# Patient Record
Sex: Female | Born: 1935 | Race: White | Hispanic: No | Marital: Married | Smoking: Former smoker
Health system: Southern US, Community
[De-identification: ages and names within clinical notes are randomized; demographics above are authoritative.]

## PROBLEM LIST (undated history)

## (undated) DIAGNOSIS — K219 Gastro-esophageal reflux disease without esophagitis: Secondary | ICD-10-CM

## (undated) DIAGNOSIS — Z9289 Personal history of other medical treatment: Secondary | ICD-10-CM

## (undated) DIAGNOSIS — I471 Supraventricular tachycardia, unspecified: Secondary | ICD-10-CM

## (undated) DIAGNOSIS — H5440 Blindness, one eye, unspecified eye: Secondary | ICD-10-CM

## (undated) DIAGNOSIS — N189 Chronic kidney disease, unspecified: Secondary | ICD-10-CM

## (undated) DIAGNOSIS — I10 Essential (primary) hypertension: Secondary | ICD-10-CM

## (undated) DIAGNOSIS — E039 Hypothyroidism, unspecified: Secondary | ICD-10-CM

## (undated) DIAGNOSIS — E785 Hyperlipidemia, unspecified: Secondary | ICD-10-CM

## (undated) DIAGNOSIS — I2699 Other pulmonary embolism without acute cor pulmonale: Secondary | ICD-10-CM

## (undated) DIAGNOSIS — Z8679 Personal history of other diseases of the circulatory system: Secondary | ICD-10-CM

## (undated) DIAGNOSIS — Z953 Presence of xenogenic heart valve: Secondary | ICD-10-CM

## (undated) DIAGNOSIS — Z9889 Other specified postprocedural states: Secondary | ICD-10-CM

## (undated) DIAGNOSIS — M1990 Unspecified osteoarthritis, unspecified site: Secondary | ICD-10-CM

## (undated) DIAGNOSIS — J449 Chronic obstructive pulmonary disease, unspecified: Secondary | ICD-10-CM

## (undated) HISTORY — PX: EYE SURGERY: SHX253

## (undated) HISTORY — DX: Other specified postprocedural states: Z98.890

## (undated) HISTORY — DX: Personal history of other diseases of the circulatory system: Z86.79

## (undated) HISTORY — PX: ANOMALOUS PULMONARY VENOUS RETURN REPAIR: SHX255

## (undated) HISTORY — DX: Supraventricular tachycardia: I47.1

## (undated) HISTORY — DX: Essential (primary) hypertension: I10

## (undated) HISTORY — DX: Presence of xenogenic heart valve: Z95.3

## (undated) HISTORY — DX: Personal history of other medical treatment: Z92.89

## (undated) HISTORY — DX: Hyperlipidemia, unspecified: E78.5

## (undated) HISTORY — DX: Other pulmonary embolism without acute cor pulmonale: I26.99

## (undated) HISTORY — DX: Supraventricular tachycardia, unspecified: I47.10

## (undated) HISTORY — DX: Blindness, one eye, unspecified eye: H54.40

## (undated) HISTORY — DX: Hypothyroidism, unspecified: E03.9

## (undated) HISTORY — DX: Chronic obstructive pulmonary disease, unspecified: J44.9

## (undated) HISTORY — DX: Chronic kidney disease, unspecified: N18.9

## (undated) HISTORY — PX: TONSILLECTOMY: SUR1361

---

## 1968-04-26 HISTORY — PX: BACK SURGERY: SHX140

## 1997-08-16 ENCOUNTER — Ambulatory Visit (HOSPITAL_COMMUNITY): Admission: RE | Admit: 1997-08-16 | Discharge: 1997-08-16 | Payer: Self-pay | Admitting: Internal Medicine

## 1998-10-24 ENCOUNTER — Ambulatory Visit (HOSPITAL_COMMUNITY): Admission: RE | Admit: 1998-10-24 | Discharge: 1998-10-24 | Payer: Self-pay | Admitting: Gastroenterology

## 2000-01-28 ENCOUNTER — Other Ambulatory Visit: Admission: RE | Admit: 2000-01-28 | Discharge: 2000-01-28 | Payer: Self-pay | Admitting: Internal Medicine

## 2000-02-11 ENCOUNTER — Ambulatory Visit (HOSPITAL_COMMUNITY): Admission: RE | Admit: 2000-02-11 | Discharge: 2000-02-11 | Payer: Self-pay | Admitting: Internal Medicine

## 2000-02-11 ENCOUNTER — Encounter: Payer: Self-pay | Admitting: Internal Medicine

## 2000-11-23 ENCOUNTER — Encounter: Payer: Self-pay | Admitting: Internal Medicine

## 2000-11-23 ENCOUNTER — Encounter: Admission: RE | Admit: 2000-11-23 | Discharge: 2000-11-23 | Payer: Self-pay | Admitting: Internal Medicine

## 2001-03-15 ENCOUNTER — Ambulatory Visit (HOSPITAL_COMMUNITY): Admission: RE | Admit: 2001-03-15 | Discharge: 2001-03-15 | Payer: Self-pay | Admitting: Internal Medicine

## 2001-03-15 ENCOUNTER — Encounter: Payer: Self-pay | Admitting: Internal Medicine

## 2001-03-20 ENCOUNTER — Encounter: Admission: RE | Admit: 2001-03-20 | Discharge: 2001-03-20 | Payer: Self-pay | Admitting: Internal Medicine

## 2001-03-20 ENCOUNTER — Encounter: Payer: Self-pay | Admitting: Internal Medicine

## 2002-09-03 ENCOUNTER — Encounter: Payer: Self-pay | Admitting: Internal Medicine

## 2002-09-03 ENCOUNTER — Ambulatory Visit (HOSPITAL_COMMUNITY): Admission: RE | Admit: 2002-09-03 | Discharge: 2002-09-03 | Payer: Self-pay | Admitting: Internal Medicine

## 2004-05-18 ENCOUNTER — Ambulatory Visit (HOSPITAL_COMMUNITY): Admission: RE | Admit: 2004-05-18 | Discharge: 2004-05-18 | Payer: Self-pay | Admitting: Internal Medicine

## 2005-07-05 ENCOUNTER — Ambulatory Visit (HOSPITAL_COMMUNITY): Admission: RE | Admit: 2005-07-05 | Discharge: 2005-07-05 | Payer: Self-pay | Admitting: Internal Medicine

## 2006-02-15 ENCOUNTER — Encounter: Payer: Self-pay | Admitting: Cardiology

## 2006-03-10 ENCOUNTER — Encounter: Admission: RE | Admit: 2006-03-10 | Discharge: 2006-03-10 | Payer: Self-pay | Admitting: Internal Medicine

## 2006-11-07 ENCOUNTER — Ambulatory Visit (HOSPITAL_COMMUNITY): Admission: RE | Admit: 2006-11-07 | Discharge: 2006-11-07 | Payer: Self-pay | Admitting: Internal Medicine

## 2007-07-25 ENCOUNTER — Encounter: Payer: Self-pay | Admitting: Critical Care Medicine

## 2007-08-08 ENCOUNTER — Encounter: Admission: RE | Admit: 2007-08-08 | Discharge: 2007-08-08 | Payer: Self-pay | Admitting: Internal Medicine

## 2007-09-05 ENCOUNTER — Encounter: Payer: Self-pay | Admitting: Critical Care Medicine

## 2007-09-20 ENCOUNTER — Ambulatory Visit: Admission: RE | Admit: 2007-09-20 | Discharge: 2007-09-20 | Payer: Self-pay | Admitting: Internal Medicine

## 2007-09-20 ENCOUNTER — Encounter: Payer: Self-pay | Admitting: Cardiology

## 2007-10-05 ENCOUNTER — Encounter: Admission: RE | Admit: 2007-10-05 | Discharge: 2007-10-05 | Payer: Self-pay | Admitting: Internal Medicine

## 2007-10-19 ENCOUNTER — Encounter: Payer: Self-pay | Admitting: Critical Care Medicine

## 2007-12-13 ENCOUNTER — Ambulatory Visit (HOSPITAL_COMMUNITY): Admission: RE | Admit: 2007-12-13 | Discharge: 2007-12-13 | Payer: Self-pay | Admitting: Internal Medicine

## 2008-03-06 DIAGNOSIS — I517 Cardiomegaly: Secondary | ICD-10-CM | POA: Insufficient documentation

## 2008-03-07 ENCOUNTER — Ambulatory Visit: Payer: Self-pay | Admitting: Critical Care Medicine

## 2008-03-07 DIAGNOSIS — J449 Chronic obstructive pulmonary disease, unspecified: Secondary | ICD-10-CM

## 2008-03-07 DIAGNOSIS — I719 Aortic aneurysm of unspecified site, without rupture: Secondary | ICD-10-CM | POA: Insufficient documentation

## 2008-03-07 DIAGNOSIS — J438 Other emphysema: Secondary | ICD-10-CM

## 2008-03-07 DIAGNOSIS — J4489 Other specified chronic obstructive pulmonary disease: Secondary | ICD-10-CM | POA: Insufficient documentation

## 2008-04-25 ENCOUNTER — Encounter: Payer: Self-pay | Admitting: Critical Care Medicine

## 2008-04-26 DIAGNOSIS — Z8679 Personal history of other diseases of the circulatory system: Secondary | ICD-10-CM

## 2008-04-26 DIAGNOSIS — I2699 Other pulmonary embolism without acute cor pulmonale: Secondary | ICD-10-CM

## 2008-04-26 HISTORY — DX: Personal history of other diseases of the circulatory system: Z86.79

## 2008-04-26 HISTORY — PX: THORACIC AORTIC ANEURYSM REPAIR: SHX799

## 2008-04-26 HISTORY — DX: Other pulmonary embolism without acute cor pulmonale: I26.99

## 2008-05-08 ENCOUNTER — Encounter: Payer: Self-pay | Admitting: Critical Care Medicine

## 2008-06-13 ENCOUNTER — Encounter (HOSPITAL_COMMUNITY): Admission: RE | Admit: 2008-06-13 | Discharge: 2008-06-21 | Payer: Self-pay | Admitting: Cardiology

## 2008-07-22 ENCOUNTER — Encounter: Payer: Self-pay | Admitting: Cardiology

## 2008-08-01 ENCOUNTER — Encounter: Payer: Self-pay | Admitting: Cardiology

## 2008-08-12 ENCOUNTER — Encounter: Payer: Self-pay | Admitting: Cardiology

## 2008-09-04 ENCOUNTER — Ambulatory Visit: Payer: Self-pay | Admitting: Critical Care Medicine

## 2008-09-05 ENCOUNTER — Encounter: Payer: Self-pay | Admitting: Critical Care Medicine

## 2008-12-16 ENCOUNTER — Telehealth (INDEPENDENT_AMBULATORY_CARE_PROVIDER_SITE_OTHER): Payer: Self-pay | Admitting: *Deleted

## 2008-12-16 ENCOUNTER — Ambulatory Visit (HOSPITAL_COMMUNITY): Admission: RE | Admit: 2008-12-16 | Discharge: 2008-12-16 | Payer: Self-pay | Admitting: Internal Medicine

## 2008-12-18 ENCOUNTER — Telehealth (INDEPENDENT_AMBULATORY_CARE_PROVIDER_SITE_OTHER): Payer: Self-pay | Admitting: *Deleted

## 2008-12-26 ENCOUNTER — Ambulatory Visit: Admission: RE | Admit: 2008-12-26 | Discharge: 2008-12-26 | Payer: Self-pay | Admitting: Critical Care Medicine

## 2008-12-26 ENCOUNTER — Encounter: Payer: Self-pay | Admitting: Critical Care Medicine

## 2008-12-27 ENCOUNTER — Encounter: Payer: Self-pay | Admitting: Critical Care Medicine

## 2009-01-01 ENCOUNTER — Ambulatory Visit: Payer: Self-pay | Admitting: Critical Care Medicine

## 2009-01-02 ENCOUNTER — Encounter: Payer: Self-pay | Admitting: Cardiology

## 2009-01-28 HISTORY — PX: THORACOABDOMINAL AORTIC ANEURYSM REPAIR: SHX2504

## 2009-03-11 ENCOUNTER — Ambulatory Visit: Payer: Self-pay | Admitting: Critical Care Medicine

## 2009-05-26 ENCOUNTER — Encounter: Payer: Self-pay | Admitting: Cardiology

## 2009-06-12 ENCOUNTER — Ambulatory Visit: Payer: Self-pay | Admitting: Critical Care Medicine

## 2009-11-04 ENCOUNTER — Ambulatory Visit: Payer: Self-pay | Admitting: Cardiology

## 2009-11-04 DIAGNOSIS — I359 Nonrheumatic aortic valve disorder, unspecified: Secondary | ICD-10-CM | POA: Insufficient documentation

## 2009-11-04 DIAGNOSIS — E785 Hyperlipidemia, unspecified: Secondary | ICD-10-CM

## 2009-11-04 DIAGNOSIS — R0989 Other specified symptoms and signs involving the circulatory and respiratory systems: Secondary | ICD-10-CM

## 2009-11-04 DIAGNOSIS — I10 Essential (primary) hypertension: Secondary | ICD-10-CM

## 2009-11-05 ENCOUNTER — Telehealth: Payer: Self-pay | Admitting: Cardiology

## 2009-11-06 LAB — CONVERTED CEMR LAB
AST: 23 units/L (ref 0–37)
Albumin: 4 g/dL (ref 3.5–5.2)
Alkaline Phosphatase: 51 units/L (ref 39–117)
Bilirubin, Direct: 0.1 mg/dL (ref 0.0–0.3)
HDL: 61.8 mg/dL (ref >39.00)
LDL Cholesterol: 109 mg/dL — ABNORMAL HIGH (ref 0–99)
Total Bilirubin: 0.8 mg/dL (ref 0.3–1.2)
Total CHOL/HDL Ratio: 3
Triglycerides: 85 mg/dL (ref 0.0–149.0)

## 2009-12-01 ENCOUNTER — Ambulatory Visit: Payer: Self-pay | Admitting: Internal Medicine

## 2009-12-01 ENCOUNTER — Encounter: Payer: Self-pay | Admitting: Cardiovascular Disease

## 2009-12-01 ENCOUNTER — Ambulatory Visit: Payer: Self-pay

## 2009-12-01 ENCOUNTER — Encounter: Payer: Self-pay | Admitting: Cardiology

## 2009-12-01 ENCOUNTER — Ambulatory Visit (HOSPITAL_COMMUNITY): Admission: RE | Admit: 2009-12-01 | Discharge: 2009-12-01 | Payer: Self-pay | Admitting: Cardiology

## 2009-12-17 ENCOUNTER — Ambulatory Visit: Payer: Self-pay | Admitting: Critical Care Medicine

## 2009-12-18 ENCOUNTER — Ambulatory Visit (HOSPITAL_COMMUNITY): Admission: RE | Admit: 2009-12-18 | Discharge: 2009-12-18 | Payer: Self-pay | Admitting: Internal Medicine

## 2010-01-09 ENCOUNTER — Ambulatory Visit: Payer: Self-pay | Admitting: Cardiology

## 2010-01-13 LAB — CONVERTED CEMR LAB
ALT: 16 units/L (ref 0–35)
AST: 25 units/L (ref 0–37)
Alkaline Phosphatase: 56 units/L (ref 39–117)
Cholesterol: 189 mg/dL (ref 0–200)
LDL Cholesterol: 114 mg/dL — ABNORMAL HIGH (ref 0–99)
Total Bilirubin: 0.3 mg/dL (ref 0.3–1.2)
Total CHOL/HDL Ratio: 3
VLDL: 12.8 mg/dL (ref 0.0–40.0)

## 2010-05-06 ENCOUNTER — Encounter: Payer: Self-pay | Admitting: Cardiology

## 2010-05-06 ENCOUNTER — Ambulatory Visit
Admission: RE | Admit: 2010-05-06 | Discharge: 2010-05-06 | Payer: Self-pay | Source: Home / Self Care | Attending: Cardiology | Admitting: Cardiology

## 2010-05-28 NOTE — Assessment & Plan Note (Signed)
Summary: np6/hx of heart valve replacement//jml   Primary Provider:  Juline Patch  CC:  new patient.  Hx of heart valve replacement and ascending aorta.  History of Present Illness: 77 yo with history of bioprosthetic aortic valve replacement as well as extensive aortic aneurym repair involving the ascending and descending thoracic aorta as well as the abdominal aorta presents to establish cardiology followup.  Patient had her initial operation (AVR and ascending thoracic aortic aneurysm repair) in 1/10.  In 10/10, she had repair of the descending thoracic and abdominal aorta.  She has done well post-operatively and follows closely with Dr. Ty Hilts at [facility], where she had the operations.  She has moderate COPD and is followed by Dr. Delford Field.  She is not on home oxygen.  In general, she is doing well.  She push mows her lawn with mild shortness of breath.  She can walk on flat ground without shortness of breath. No exertional chest pain.  She uses a Symbicort inhaler for her COPD.   ECG: NSR at 52, otherwise normal  Labs (4/11): K 4.7, creatinine 1.5 Labs (7/11): LDL 109, HDL 61  Current Medications (verified): 1)  Synthroid 125 Mcg Tabs (Levothyroxine Sodium) .Marland Kitchen.. 1 By Mouth Daily 2)  Metoprolol Tartrate 50 Mg Tabs (Metoprolol Tartrate) .... Take One Tablet Two Times A Day 3)  Vitamin D 69629 Unit Caps (Ergocalciferol) .Marland Kitchen.. 1 By Mouth Monthly 4)  Omeprazole 20 Mg Tbec (Omeprazole) .... One By Mouth Once Daily 5)  Proair Hfa 108 (90 Base) Mcg/act Aers (Albuterol Sulfate) .... As Needed 6)  Calcium 500 Mg Tabs (Calcium Carbonate) .... One By Mouth Once Daily 7)  Simvastatin 20 Mg Tabs (Simvastatin) .Marland Kitchen.. 1 By Mouth At Bedtime 8)  Amlodipine Besylate 5 Mg Tabs (Amlodipine Besylate) .Marland Kitchen.. 1 By Mouth Daily 9)  Symbicort 160-4.5 Mcg/act  Aero (Budesonide-Formoterol Fumarate) .... Two Puffs Twice Daily 10)  Aspirin 81 Mg Tabs (Aspirin) .... Take One Tablet Three Times A Week 11)  Losartan  Potassium 100 Mg Tabs (Losartan Potassium) .... Once Daily  Allergies: 1)  ! Pcn 2)  ! Codeine  Past History:  Past Medical History: 1. EMPHYSEMA : Followed by Dr. Delford Field. 2.  Thoracic aortic aneurysm repair 1/10 also with bioprosthetic aortic valve replacement [facility]).  In 10/10, she had a descending thoracic aorta aneurysm and abdominal aortic aneurysm repair [facility]).  Vascular surgeon is Dr. Ty Hilts.  3.  Pulmonary embolism 2010.  She is no longer taking coumadin.  4.  SVT: Short runs seen on prior holter. 5.  Hypothyroidism 6.  R eye blind 7.  Hyperlipidemia 8.  Bioprosthetic aortic valve: most recent echo (4/10) with EF > 55% and apical/mid-distal septal hypokinesis, normal bioprosthetic aortic valve 9.  CKD 10.  HTN  Family History: Brain cancer--MGM No premature CAD  Social History: Quit smoking in 2006. 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   Review of Systems       All systems reviewed and negative except as per HPI.   Vital Signs:  Patient profile:   77 year old female Height:      61 inches Weight:      125 pounds BMI:     23.70 Pulse rate:   54 / minute Pulse rhythm:   regular BP sitting:   114 / 61  (left arm) Cuff size:   regular  Vitals Entered By: Judithe Modest CMA (November 04, 2009 11:21 AM)  Physical Exam  General:  Well developed, well  nourished, in no acute distress. Head:  normocephalic and atraumatic Eyes:  Right eye blindness Mouth:  Teeth, gums and palate normal. Oral mucosa normal. Neck:  Neck supple,  JVP 7-8 cm. No masses, thyromegaly or abnormal cervical nodes. Lungs:  Clear bilaterally to auscultation and percussion. Heart:  Non-displaced PMI, chest non-tender; regular rate and rhythm, S1, S2 without rubs or gallops. 2/6 early systolic murmur.  Carotid upstroke normal, left carotid bruit.  Pedals normal pulses. No edema, no varicosities. Abdomen:  Bowel sounds positive; abdomen soft and non-tender without masses,  organomegaly, or hernias noted. No hepatosplenomegaly. Msk:  Back normal, normal gait. Muscle strength and tone normal. Extremities:  No clubbing or cyanosis. Neurologic:  Alert and oriented x 3. Psych:  Normal affect.   Impression & Recommendations:  Problem # 1:  AORTIC DISORDER (ICD-424.1) Patient is status post bioprosthetic AVR and extensive replacement of the thoracic and abdominal aorta.  She has done well post-operatively.  She will followup at [facility] for imaging of her aorta.  I will get an echocardiogram to assess the aortic valve bioprosthesis.  I also recommended that she take aspirin 81 mg daily rather than 3 times a week.   Problem # 2:  CAROTID BRUIT (ICD-785.9) Left carotid bruit.  Will get a carotid doppler study.   Problem # 3:  EMPHYSEMA (ICD-492.8) Stable mild exertional dyspnea.    Problem # 4:  HYPERLIPIDEMIA-MIXED (ICD-272.4) Given carotid bruit and likely vascular disease, would like to see LDL at least < 100.  Will increase Zocor to 40 mg daily.   Problem # 5:  HYPERTENSION, UNSPECIFIED (ICD-401.9) Need good blood pressure control with aortic aneurysmal disease.  Her BP today is excellent, continue current meds.   Other Orders: Carotid Duplex (Carotid Duplex) Echocardiogram (Echo) TLB-Lipid Panel (80061-LIPID) TLB-Hepatic/Liver Function Pnl (80076-HEPATIC)  Patient Instructions: 1)  Your physician has recommended you make the following change in your medication:  2)  Take Aspirin 81mg  daily--this should be buffered or coated. 3)  Your physician has requested that you have a carotid duplex. This test is an ultrasound of the carotid arteries in your neck. It looks at blood flow through these arteries that supply the brain with blood. Allow one hour for this exam. There are no restrictions or special instructions. 4)  Your physician has requested that you have an echocardiogram.  Echocardiography is a painless test that uses sound waves to create images of  your heart. It provides your doctor with information about the size and shape of your heart and how well your heart's chambers and valves are working.  This procedure takes approximately one hour. There are no restrictions for this procedure. 5)  Your physician wants you to follow-up in: 6 months with Dr Shirlee Latch.  You will receive a reminder letter in the mail two months in advance. If you don't receive a letter, please call our office to schedule the follow-up appointment. 6)  Your physician recommends that you return for a FASTING lipid profile/liver profile today.

## 2010-05-28 NOTE — Assessment & Plan Note (Signed)
Summary: Pulmonary OV   Copy to:  Dr. Ricki Miller Primary Provider/Referring Provider:  Juline Patch  CC:  6 month follow up.  Pt states she does have SOB when lifting heavy objects or walking a long distance but this is no worse from last OV.  Denies wheezing, chest tightness, and cough..  History of Present Illness: Pulmonary OV:      This is a 77 year old woman with COPD and primary emphysema.    Pt dx with COPD and primary emphysema 6/09 when being eval for Thoracic aortic aneurysm.  Symptoms of dyspnea came on gradually over summer.  Pt is dypneic if walks fast more than 1/2 block.  No chest pain.  Occ wheeze.  No cough.  Not dyspneic at rest. No GERD symptoms.  Has proair and uses as needed without much improvement.  No edema in feet.   Pt denies any significant sore throat, nasal congestion or excess secretions, fever, chills, sweats, unintended weight loss, pleurtic or exertional chest pain, orthopnea PND, or leg swelling Has been on spiriva in the past but only used one week.  March 11, 2009 10:13 AM Had Aneurysm repair at [facility].The pt did well with the surgery. The pt  was in hospital 3weeks The pain is ok.  Did well with aneuyrsm repair.  Dyspnea is stable now.  Now not much cough Pt on symbicort and spiriva and ?s if can stop spiriva Pt denies any significant sore throat, nasal congestion or excess secretions, fever, chills, sweats, unintended weight loss, pleurtic or exertional chest pain, orthopnea PND, or leg swelling Pt denies any increase in rescue therapy over baseline, denies waking up needing it or having any early am or nocturnal exacerbations of coughing/wheezing/or dyspnea.  June 12, 2009 1:46 PM No real cough.  Dyspnea the same.  Worse if climb stairs or heavy lifting.  No real wheeze.   No new complaintsAugust 24, 2011 2:17 PM Not much change since 2/11.   No real cough.  Justgot over bronchitis.  PCP rx avelox.  Felt congestion in the chest.  First episode of  same in past 1.5 yrs.  Now pt is well.  No new issues. Pt is less dyspneic.  No chest pain is noted.    Preventive Screening-Counseling & Management  Alcohol-Tobacco     Smoking Status: quit > 6 months     Year Quit: 2006     Pack years: 50 years x1 pack q2-3days  Current Medications (verified): 1)  Synthroid 112 Mcg Tabs (Levothyroxine Sodium) .... Take 1 Tablet By Mouth Once A Day 2)  Metoprolol Tartrate 50 Mg Tabs (Metoprolol Tartrate) .... Take One Tablet Two Times A Day 3)  Vitamin D 16109 Unit Caps (Ergocalciferol) .Marland Kitchen.. 1 By Mouth Monthly 4)  Omeprazole 20 Mg Tbec (Omeprazole) .... One By Mouth Once Daily 5)  Proair Hfa 108 (90 Base) Mcg/act Aers (Albuterol Sulfate) .... As Needed 6)  Calcium 500 Mg Tabs (Calcium Carbonate) .... One By Mouth Once Daily 7)  Lipitor 20 Mg Tabs (Atorvastatin Calcium) .... One Daily 8)  Amlodipine Besylate 5 Mg Tabs (Amlodipine Besylate) .Marland Kitchen.. 1 By Mouth Daily 9)  Symbicort 160-4.5 Mcg/act  Aero (Budesonide-Formoterol Fumarate) .... Two Puffs Twice Daily 10)  Aspirin 81 Mg Tabs (Aspirin) .... Three Times Per Week 11)  Losartan Potassium 100 Mg Tabs (Losartan Potassium) .... Once Daily 12)  Boniva 150 Mg Tabs (Ibandronate Sodium) .... Once Monthly  Allergies (verified): 1)  ! Pcn 2)  ! Codeine  Past  History:  Past medical, surgical, family and social histories (including risk factors) reviewed, and no changes noted (except as noted below).  Past Medical History: Reviewed history from 11/04/2009 and no changes required. 1. EMPHYSEMA : Followed by Dr. Delford Field. 2.  Thoracic aortic aneurysm repair 1/10 also with bioprosthetic aortic valve replacement [facility]).  In 10/10, she had a descending thoracic aorta aneurysm and abdominal aortic aneurysm repair [facility]).  Vascular surgeon is Dr. Ty Hilts.  3.  Pulmonary embolism 2010.  She is no longer taking coumadin.  4.  SVT: Short runs seen on prior holter. 5.  Hypothyroidism 6.  R eye blind 7.   Hyperlipidemia 8.  Bioprosthetic aortic valve: most recent echo (4/10) with EF > 55% and apical/mid-distal septal hypokinesis, normal bioprosthetic aortic valve 9.  CKD 10.  HTN  Past Surgical History: Reviewed history from 03/11/2009 and no changes required. Back surgery  1970 TAA repair 1/10 AVR 1/10 Thoracoabdominal aneurysm  Repair 01/28/2009   Past Pulmonary History:  Pulmonary History: COPD primary emphysema  Pulmonary Function Test  Date: 09/20/2007 Gender: Female  Pre-Spirometry  FVC     Value: 1.50 L/min   Pred: 2.28 L/min     % Pred: 66 % FEV1     Value: 0.95 L     Pred: 1.83 L     % Pred: 52 % FEV1/FVC   Value: 63 %     Pred: 83 %     FEF 25-75   Value: 0.46 L/min   Pred: 1.94 L/min     % Pred: 23 %  Post-Spirometry  FVC     Value: 1.67 L/min   Pred: 2.28 L/min     % Pred: 73 % FEV1     Value: 1.10 L     Pred: 1.83 L     % Pred: 60 % FEV1/FVC   Value: 66 %     Pred: 83 %     FEF 25-75   Value: 0.58 L/min   Pred: 1.94 L/min     % Pred: 30 %  Lung Volumes  DLCO     Value: 16.1 %   % Pred: 101 % DLCO/VA   Value: 4.50 %   % Pred: 127 %  Evaluation:  severe obstruction with significant bronchodilator response  Pulmonary Function Test  Date: 12/26/2008 Height (in.): 61 Gender: Female  Pre-Spirometry  FVC     Value: 1.31 L/min   Pred: 2.49 L/min     % Pred: 53 % FEV1     Value: 0.94 L     Pred: 1.86 L     % Pred: 50 % FEV1/FVC   Value: 72 %     Pred: 75 %     % Pred: 96 % FEF 25-75   Value: 0.57 L/min   Pred: 1.54 L/min     % Pred: 37 %  Post-Spirometry  FVC     Value: 1.29 L/min   Pred: 2.49 L/min     % Pred: 52 % FEV1     Value: 0.96 L     Pred: 1.86 L     % Pred: 52 % FEV1/FVC   Value: 75 %     Pred: 75 %     % Pred: 100 % FEF 25-75   Value: 0.66 L/min   Pred: 1.54 L/min     % Pred: 43 %  Lung Volumes  TLC     Value: 3.58 L   % Pred: 78 % RV  Value: 61 L   % Pred: 136 % DLCO     Value: 8.63 %   % Pred: 43 % DLCO/VA   Value: 3.44 %   % Pred: 78  %  Comments:  Severe airflow obstruction,  moderate airtrapping,  severe reduction in DLCO consistent with known dx of emphysema  Family History: Reviewed history from 11/04/2009 and no changes required. Brain cancer--MGM No premature CAD  Social History: Reviewed history from 11/04/2009 and no changes required. Quit smoking in 2006. 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   Review of Systems       The patient complains of shortness of breath with activity.  The patient denies shortness of breath at rest, productive cough, non-productive cough, coughing up blood, chest pain, irregular heartbeats, acid heartburn, indigestion, loss of appetite, weight change, abdominal pain, difficulty swallowing, sore throat, tooth/dental problems, headaches, nasal congestion/difficulty breathing through nose, sneezing, itching, ear ache, anxiety, depression, hand/feet swelling, joint stiffness or pain, rash, change in color of mucus, and fever.    Vital Signs:  Patient profile:   77 year old female Height:      61 inches Weight:      126.50 pounds BMI:     23.99 O2 Sat:      96 % on Room air Temp:     97.7 degrees F oral Pulse rate:   59 / minute BP sitting:   96 / 58  (left arm) Cuff size:   regular  Vitals Entered By: Gweneth Dimitri RN (December 17, 2009 1:59 PM)  O2 Flow:  Room air CC: 6 month follow up.  Pt states she does have SOB when lifting heavy objects or walking a long distance but this is no worse from last OV.  Denies wheezing, chest tightness, cough. Comments Medications reviewed with patient Daytime contact number verified with patient. Gweneth Dimitri RN  December 17, 2009 2:00 PM    Physical Exam  Additional Exam:  Gen: Pleasant, well nourished, in no distress ENT: no lesions, no postnasal drip Neck: No JVD, no TMG, no carotid bruits Lungs: no use of accessory muscles, no dullness to percussion, clear without rales or rhonchi Cardiovascular: RRR, heart sounds normal,  no murmurs or gallops, no peripheral edema Musculoskeletal: No deformities, no cyanosis or clubbing     Impression & Recommendations:  Problem # 1:  EMPHYSEMA (ICD-492.8) Assessment Deteriorated Severe COPD with primary emphysematous component plan No change in inhaled medications.   Maintain treatment program as currently prescribed.  Medications Added to Medication List This Visit: 1)  Synthroid 112 Mcg Tabs (Levothyroxine sodium) .... Take 1 tablet by mouth once a day 2)  Aspirin 81 Mg Tabs (Aspirin) .... Three times per week 3)  Boniva 150 Mg Tabs (Ibandronate sodium) .... Once monthly  Complete Medication List: 1)  Synthroid 112 Mcg Tabs (Levothyroxine sodium) .... Take 1 tablet by mouth once a day 2)  Metoprolol Tartrate 50 Mg Tabs (Metoprolol tartrate) .... Take one tablet two times a day 3)  Vitamin D 21308 Unit Caps (Ergocalciferol) .Marland Kitchen.. 1 by mouth monthly 4)  Omeprazole 20 Mg Tbec (Omeprazole) .... One by mouth once daily 5)  Proair Hfa 108 (90 Base) Mcg/act Aers (Albuterol sulfate) .... As needed 6)  Calcium 500 Mg Tabs (Calcium carbonate) .... One by mouth once daily 7)  Lipitor 20 Mg Tabs (Atorvastatin calcium) .... One daily 8)  Amlodipine Besylate 5 Mg Tabs (Amlodipine besylate) .Marland Kitchen.. 1 by mouth daily 9)  Symbicort 160-4.5 Mcg/act Aero (  Budesonide-formoterol fumarate) .... Two puffs twice daily 10)  Aspirin 81 Mg Tabs (Aspirin) .... Three times per week 11)  Losartan Potassium 100 Mg Tabs (Losartan potassium) .... Once daily 12)  Boniva 150 Mg Tabs (Ibandronate sodium) .... Once monthly  Other Orders: Est. Patient Level III (21308)  Patient Instructions: 1)  No change in medications 2)  Return in      6    months  Prescriptions: SYMBICORT 160-4.5 MCG/ACT  AERO (BUDESONIDE-FORMOTEROL FUMARATE) Two puffs twice daily  #1 x 6   Entered and Authorized by:   Storm Frisk MD   Signed by:   Storm Frisk MD on 12/17/2009   Method used:   Electronically to         [facility]  [id number] ([facility])       [address]       [place], [place]  [address]       Ph: [phone] or [phone]       Fax: [phone]   RxID:   [id number]

## 2010-05-28 NOTE — Progress Notes (Signed)
Summary: [facility] Office Visit Note   [facility] Office Visit Note   Imported By: Roderic Ovens 11/17/2009 15:53:00  _____________________________________________________________________  External Attachment:    Type:   Image     Comment:   External Document

## 2010-05-28 NOTE — Assessment & Plan Note (Signed)
Summary: Pulmonary  OV   Copy to:  Dr. Ricki Miller Primary Provider/Referring Provider:  Juline Patch  CC:  3 mo follow up.  states breathing is the same-no better or worse.  denies cough, wheezing, and and chest tightness.  No complaints. .  History of Present Illness: Pulmonary Consultation:      This is a 77 year old woman with COPD and primary emphysema.    Pt dx with COPD and primary emphysema 6/09 when being eval for Thoracic aortic aneurysm.  Symptoms of dyspnea came on gradually over summer.  Pt is dypneic if walks fast more than 1/2 block.  No chest pain.  Occ wheeze.  No cough.  Not dyspneic at rest. No GERD symptoms.  Has proair and uses as needed without much improvement.  No edema in feet.   Pt denies any significant sore throat, nasal congestion or excess secretions, fever, chills, sweats, unintended weight loss, pleurtic or exertional chest pain, orthopnea PND, or leg swelling Has been on spiriva in the past but only used one week.  March 11, 2009 10:13 AM Had Aneurysm repair at [facility].The pt did well with the surgery. The pt  was in hospital 3weeks The pain is ok.  Did well with aneuyrsm repair.  Dyspnea is stable now.  Now not much cough Pt on symbicort and spiriva and ?s if can stop spiriva Pt denies any significant sore throat, nasal congestion or excess secretions, fever, chills, sweats, unintended weight loss, pleurtic or exertional chest pain, orthopnea PND, or leg swelling Pt denies any increase in rescue therapy over baseline, denies waking up needing it or having any early am or nocturnal exacerbations of coughing/wheezing/or dyspnea.  June 12, 2009 1:46 PM No real cough.  Dyspnea the same.  Worse if climb stairs or heavy lifting.  No real wheeze.   No new complaints  Preventive Screening-Counseling & Management  Alcohol-Tobacco     Smoking Status: quit > 6 months  Current Medications (verified): 1)  Synthroid 125 Mcg Tabs (Levothyroxine Sodium) .Marland Kitchen.. 1 By  Mouth Daily 2)  Metoprolol Tartrate 25 Mg Tabs (Metoprolol Tartrate) .... 3 Tab in Am and 3 Tab in Pm 3)  Vitamin D 60454 Unit Caps (Ergocalciferol) .Marland Kitchen.. 1 By Mouth Monthly 4)  Omeprazole 20 Mg Tbec (Omeprazole) .... One By Mouth Once Daily 5)  Proair Hfa 108 (90 Base) Mcg/act Aers (Albuterol Sulfate) .... As Needed 6)  Calcium 500 Mg Tabs (Calcium Carbonate) .... One By Mouth Once Daily 7)  Simvastatin 20 Mg Tabs (Simvastatin) .Marland Kitchen.. 1 By Mouth At Bedtime 8)  Benicar 40 Mg Tabs (Olmesartan Medoxomil) .Marland Kitchen.. 1 By Mouth Daily 9)  Amlodipine Besylate 5 Mg Tabs (Amlodipine Besylate) .Marland Kitchen.. 1 By Mouth Daily 10)  Symbicort 160-4.5 Mcg/act  Aero (Budesonide-Formoterol Fumarate) .... Two Puffs Twice Daily  Allergies (verified): 1)  ! Pcn  Past History:  Past medical, surgical, family and social histories (including risk factors) reviewed, and no changes noted (except as noted below).  Past Medical History: Reviewed history from 03/11/2009 and no changes required. Current Problems:  EMPHYSEMA (ICD-492.8) AORTIC ANEURYSM (ICD-441.9)thoracic and abdominal      -TAA surgery 1/10     -AVR  1/10     -TAA/AAA repair 10/10 Pulmonary embolism      2010 CARDIOMEGALY (ICD-429.3)  Past Surgical History: Reviewed history from 03/11/2009 and no changes required. Back surgery  1970 TAA repair 1/10 AVR 1/10 Thoracoabdominal aneurysm  Repair 01/28/2009   Past Pulmonary History:  Pulmonary History: COPD primary  emphysema  Pulmonary Function Test  Date: 09/20/2007 Gender: Female  Pre-Spirometry  FVC     Value: 1.50 L/min   Pred: 2.28 L/min     % Pred: 66 % FEV1     Value: 0.95 L     Pred: 1.83 L     % Pred: 52 % FEV1/FVC   Value: 63 %     Pred: 83 %     FEF 25-75   Value: 0.46 L/min   Pred: 1.94 L/min     % Pred: 23 %  Post-Spirometry  FVC     Value: 1.67 L/min   Pred: 2.28 L/min     % Pred: 73 % FEV1     Value: 1.10 L     Pred: 1.83 L     % Pred: 60 % FEV1/FVC   Value: 66 %     Pred: 83 %      FEF 25-75   Value: 0.58 L/min   Pred: 1.94 L/min     % Pred: 30 %  Lung Volumes  DLCO     Value: 16.1 %   % Pred: 101 % DLCO/VA   Value: 4.50 %   % Pred: 127 %  Evaluation:  severe obstruction with significant bronchodilator response  Pulmonary Function Test  Date: 12/26/2008 Height (in.): 61 Gender: Female  Pre-Spirometry  FVC     Value: 1.31 L/min   Pred: 2.49 L/min     % Pred: 53 % FEV1     Value: 0.94 L     Pred: 1.86 L     % Pred: 50 % FEV1/FVC   Value: 72 %     Pred: 75 %     % Pred: 96 % FEF 25-75   Value: 0.57 L/min   Pred: 1.54 L/min     % Pred: 37 %  Post-Spirometry  FVC     Value: 1.29 L/min   Pred: 2.49 L/min     % Pred: 52 % FEV1     Value: 0.96 L     Pred: 1.86 L     % Pred: 52 % FEV1/FVC   Value: 75 %     Pred: 75 %     % Pred: 100 % FEF 25-75   Value: 0.66 L/min   Pred: 1.54 L/min     % Pred: 43 %  Lung Volumes  TLC     Value: 3.58 L   % Pred: 78 % RV     Value: 61 L   % Pred: 136 % DLCO     Value: 8.63 %   % Pred: 43 % DLCO/VA   Value: 3.44 %   % Pred: 78 %  Comments:  Severe airflow obstruction,  moderate airtrapping,  severe reduction in DLCO consistent with known dx of emphysema  Family History: Reviewed history from 03/07/2008 and no changes required. Brain cancer--MGM  Social History: Reviewed history from 03/07/2008 and no changes required. Patient states former smoker.  Married Retired Visual merchandiser  Review of Systems       The patient complains of shortness of breath with activity.  The patient denies shortness of breath at rest, productive cough, non-productive cough, coughing up blood, chest pain, irregular heartbeats, acid heartburn, indigestion, loss of appetite, weight change, abdominal pain, difficulty swallowing, sore throat, tooth/dental problems, headaches, nasal congestion/difficulty breathing through nose, sneezing, itching, ear ache, anxiety, depression, hand/feet swelling, joint stiffness or pain, rash, change in color of mucus,  and fever.  Vital Signs:  Patient profile:   77 year old female Height:      61 inches Weight:      123.38 pounds BMI:     23.40 O2 Sat:      95 % on Room air Temp:     97.7 degrees F oral Pulse rate:   62 / minute BP sitting:   130 / 78  (right arm) Cuff size:   regular  Vitals Entered By: Gweneth Dimitri RN (June 12, 2009 1:37 PM)  O2 Flow:  Room air CC: 3 mo follow up.  states breathing is the same-no better or worse.  denies cough, wheezing, and chest tightness.  No complaints.  Comments Medications reviewed with patient Daytime contact number verified with patient. Gweneth Dimitri RN  June 12, 2009 1:37 PM    Physical Exam  Additional Exam:  Gen: Pleasant, well nourished, in no distress ENT: no lesions, no postnasal drip Neck: No JVD, no TMG, no carotid bruits Lungs: no use of accessory muscles, no dullness to percussion, clear without rales or rhonchi Cardiovascular: RRR, heart sounds normal, no murmurs or gallops, no peripheral edema Musculoskeletal: No deformities, no cyanosis or clubbing     Impression & Recommendations:  Problem # 1:  COPD (ICD-496) Assessment Unchanged Stable copd plan  cont inhaled meds  Complete Medication List: 1)  Synthroid 125 Mcg Tabs (Levothyroxine sodium) .Marland Kitchen.. 1 by mouth daily 2)  Metoprolol Tartrate 25 Mg Tabs (Metoprolol tartrate) .... 3 tab in am and 3 tab in pm 3)  Vitamin D 16109 Unit Caps (Ergocalciferol) .Marland Kitchen.. 1 by mouth monthly 4)  Omeprazole 20 Mg Tbec (Omeprazole) .... One by mouth once daily 5)  Proair Hfa 108 (90 Base) Mcg/act Aers (Albuterol sulfate) .... As needed 6)  Calcium 500 Mg Tabs (Calcium carbonate) .... One by mouth once daily 7)  Simvastatin 20 Mg Tabs (Simvastatin) .Marland Kitchen.. 1 by mouth at bedtime 8)  Benicar 40 Mg Tabs (Olmesartan medoxomil) .Marland Kitchen.. 1 by mouth daily 9)  Amlodipine Besylate 5 Mg Tabs (Amlodipine besylate) .Marland Kitchen.. 1 by mouth daily 10)  Symbicort 160-4.5 Mcg/act Aero (Budesonide-formoterol  fumarate) .... Two puffs twice daily  Other Orders: Est. Patient Level III (60454)  Patient Instructions: 1)  Return 6 months 2)  No change in medications    Appended Document: Pulmonary  OV fax richard pang

## 2010-05-28 NOTE — Letter (Signed)
Summary: [facility] US 2007  [facility] 2007   Imported By: Roderic Ovens 11/17/2009 15:59:09  _____________________________________________________________________  External Attachment:    Type:   Image     Comment:   External Document

## 2010-05-28 NOTE — Progress Notes (Signed)
Summary: test result**Dr Shirlee Latch**  Phone Note Call from Patient Call back at [facility] Phone [phone]   Caller: Patient Reason for Call: Talk to Nurse, Lab or Test Results Initial call taken by: Lorne Skeens,  November 05, 2009 9:01 AM  Follow-up for Phone Call        Pt aware of results.  The pt is currently taking both Simvastatin and Amlodipine.  I will have Dr Shirlee Latch review the pt's labwork again for further orders. Julieta Gutting, RN, BSN  November 05, 2009 9:35 AM discussed with Dr Almon Hercules to D/C Simvastatin and start Lipitor 20mg  daily-pt aware--

## 2010-05-28 NOTE — Assessment & Plan Note (Signed)
Summary: 6 MONTH   Visit Type:  Follow-up Referring Provider:  Dr. Ricki Miller Primary Provider:  Juline Patch   History of Present Illness: 77 yo with history of bioprosthetic aortic valve replacement as well as extensive aortic aneurym repair involving the ascending and descending thoracic aorta as well as the abdominal aorta presents for cardiology followup.  Patient had her initial operation (AVR and ascending thoracic aortic aneurysm repair) in 1/10.  In 10/10, she had repair of the descending thoracic and abdominal aorta.  She has done well post-operatively and follows closely with Dr. Ty Hilts at [facility], where she had the operations.  She has moderate COPD and is followed by Dr. Delford Field.  She is not on home oxygen.  Echo in 8/11 showed normal systolic function and the aortic valve was poorly visualized but likely ok.    Patient denies any increase in her chronic exertional dyspnea.  She can walk 2 blocks then is short of breath.  No chest pain.  Over the last few days, she has felt fatigued and "bad."  No cough.  She has had shaking chills but no definite fever.  She saw her PCP yesterday, was started on an antibiotic, and a CXR was done.  She is supposed to call today to get CXR results.    Patient last saw her vascular surgeon at [facility] 6 months ago.  CT evaluation of the aorta at that time was normal per patient.  She is supposed to get a CT again in 6/12.   She is not taking ASA daily because of some bruising.   ECG: NSR, LAE  Labs (4/11): K 4.7, creatinine 1.5 Labs (7/11): LDL 109, HDL 61 Labs (9/11): LDL 114, HDL 63  Current Medications (verified): 1)  Synthroid 112 Mcg Tabs (Levothyroxine Sodium) .... Take 1 Tablet By Mouth Once A Day 2)  Metoprolol Tartrate 50 Mg Tabs (Metoprolol Tartrate) .... Take One Tablet Two Times A Day 3)  Vitamin D 95188 Unit Caps (Ergocalciferol) .Marland Kitchen.. 1 By Mouth Monthly 4)  Omeprazole 20 Mg Tbec (Omeprazole) .... One By Mouth Once Daily 5)  Proair  Hfa 108 (90 Base) Mcg/act Aers (Albuterol Sulfate) .... As Needed 6)  Calcium 500 Mg Tabs (Calcium Carbonate) .... One By Mouth Once Daily 7)  Lipitor 20 Mg Tabs (Atorvastatin Calcium) .... One Daily 8)  Amlodipine Besylate 5 Mg Tabs (Amlodipine Besylate) .Marland Kitchen.. 1 By Mouth Daily 9)  Symbicort 160-4.5 Mcg/act  Aero (Budesonide-Formoterol Fumarate) .... Two Puffs Twice Daily 10)  Aspirin 81 Mg Tabs (Aspirin) .... Three Times Per Week 11)  Losartan Potassium 100 Mg Tabs (Losartan Potassium) .... Once Daily 12)  Boniva 150 Mg Tabs (Ibandronate Sodium) .... Once Monthly  Allergies (verified): 1)  ! Pcn 2)  ! Codeine  Past History:  Past Medical History: 1. EMPHYSEMA : Followed by Dr. Delford Field. 2.  Thoracic aortic aneurysm repair 1/10 also with bioprosthetic aortic valve replacement [facility]).  In 10/10, she had a descending thoracic aorta aneurysm and abdominal aortic aneurysm repair [facility]).  Vascular surgeon is Dr. Ty Hilts.  3.  Pulmonary embolism 2010.  She is no longer taking coumadin.  4.  SVT: Short runs seen on prior holter. 5.  Hypothyroidism 6.  R eye blind 7.  Hyperlipidemia 8.  Bioprosthetic aortic valve: Echo (8/11) with EF 55-60%, mild-mod MR, mild-mod TR, poorly visualized bioprosthetic aortic valve but no significant regurgitation and gradient not significantly elevated.  9.  CKD 10.  HTN 11.  Carotid dopplers (8/11):  No significant stenosis  Family History: Reviewed history from 11/04/2009 and no changes required. Brain cancer--MGM No premature CAD  Social History: Reviewed history from 11/04/2009 and no changes required. Quit smoking in 2006. 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   Review of Systems       All systems reviewed and negative except as per HPI.   Vital Signs:  Patient profile:   77 year old female Height:      61 inches Weight:      127.25 pounds BMI:     24.13 Pulse rate:   76 / minute BP sitting:   114 / 60  Vitals Entered  By: Micki Riley CNA (May 06, 2010 9:41 AM)  Physical Exam  General:  Well developed, well nourished, in no acute distress. Neck:  Neck supple, no JVD. No masses, thyromegaly or abnormal cervical nodes. Lungs:  Slightly decreased breath sounds bilaterally, no crackles.  Heart:  Non-displaced PMI, chest non-tender; regular rate and rhythm, S1, S2 without rubs or gallops. 2/6 early systolic murmur.  Carotid upstroke normal, left carotid bruit.  Pedals normal pulses. No edema, no varicosities. Abdomen:  Bowel sounds positive; abdomen soft and non-tender without masses, organomegaly, or hernias noted. No hepatosplenomegaly. Extremities:  No clubbing or cyanosis. Neurologic:  Alert and oriented x 3. Psych:  Normal affect.   Impression & Recommendations:  Problem # 1:  CAROTID BRUIT (ICD-785.9) No significant stenosis on carotid dopplers.   Problem # 2:  AORTIC ANEURYSM (ICD-441.9) Status post extensive repair of the thoracic and abdominal aorta.  Gets regular followup at [facility], next CTA in 6/12.  Needs to keep BP under good control (excellent today).   Problem # 3:  AORTIC DISORDER (ICD-424.1) Patient is status post bioprosthetic AVR and extensive replacement of the thoracic and abdominal aorta.  Echo did not show significant abnormality of the valve in 8/11.  She should take ASA 81 mg daily.   Problem # 4:  URI Patient seems to have a viral syndrome today.  Lung exam is relatively benign.  She does have a history of COPD.  She had a CXR at her PCP's and was started on antibiotics.    I will call her PCP for most recent lipids.   Patient Instructions: 1)  Take Aspirin to 81mg  once daily. 2)  Your physician wants you to follow-up in: 6 months.  You will receive a reminder letter in the mail two months in advance. If you don't receive a letter, please call our office to schedule the follow-up appointment.

## 2010-05-28 NOTE — Procedures (Signed)
Summary: [facility] Patient Demographics   [facility] Patient Demographics   Imported By: Roderic Ovens 11/17/2009 15:58:00  _____________________________________________________________________  External Attachment:    Type:   Image     Comment:   External Document

## 2010-06-02 ENCOUNTER — Ambulatory Visit: Payer: Self-pay | Admitting: Critical Care Medicine

## 2010-06-09 ENCOUNTER — Ambulatory Visit (INDEPENDENT_AMBULATORY_CARE_PROVIDER_SITE_OTHER): Payer: Medicare Other | Admitting: Critical Care Medicine

## 2010-06-09 ENCOUNTER — Encounter: Payer: Self-pay | Admitting: Critical Care Medicine

## 2010-06-09 DIAGNOSIS — K219 Gastro-esophageal reflux disease without esophagitis: Secondary | ICD-10-CM

## 2010-06-09 DIAGNOSIS — J449 Chronic obstructive pulmonary disease, unspecified: Secondary | ICD-10-CM

## 2010-06-16 ENCOUNTER — Telehealth: Payer: Self-pay | Admitting: Critical Care Medicine

## 2010-06-17 NOTE — Assessment & Plan Note (Signed)
Summary: Pulmonary OV   Copy to:  Dr. Ricki Miller Primary Provider/Referring Provider:  Juline Patch  CC:  6 month follow up.  Pt states she was recently treated for pna by PCP approx 2 1/2 wks ago.  Pt states breathing is unchanged from last OV - SOB when lifting heavy objects or walking too fast.  Occas wheezing.  Denies chest tightness and cough..  History of Present Illness: Pulmonary OV:      This is a 77 year old woman with COPD and primary emphysema.    Pt dx with COPD and primary emphysema 6/09 when being eval for Thoracic aortic aneurysm.  Symptoms of dyspnea came on gradually over summer.  Pt is dypneic if walks fast more than 1/2 block.  No chest pain.  Occ wheeze.  No cough.  Not dyspneic at rest. No GERD symptoms.  Has proair and uses as needed without much improvement.  No edema in feet.   Pt denies any significant sore throat, nasal congestion or excess secretions, fever, chills, sweats, unintended weight loss, pleurtic or exertional chest pain, orthopnea PND, or leg swelling Has been on spiriva in the past but only used one week.  March 11, 2009 10:13 AM Had Aneurysm repair at [facility].The pt did well with the surgery. The pt  was in hospital 3weeks The pain is ok.  Did well with aneuyrsm repair.  Dyspnea is stable now.  Now not much cough Pt on symbicort and spiriva and ?s if can stop spiriva Pt denies any significant sore throat, nasal congestion or excess secretions, fever, chills, sweats, unintended weight loss, pleurtic or exertional chest pain, orthopnea PND, or leg swelling Pt denies any increase in rescue therapy over baseline, denies waking up needing it or having any early am or nocturnal exacerbations of coughing/wheezing/or dyspnea.  June 12, 2009 1:46 PM No real cough.  Dyspnea the same.  Worse if climb stairs or heavy lifting.  No real wheeze.   No new complaints.   No other new issues noted.   Pt denies any significant sore throat, nasal congestion or excess  secretions, fever, chills, sweats, unintended weight loss, pleurtic or exertional chest pain, orthopnea PND, or leg swelling Pt denies any increase in rescue therapy over baseline, denies waking up needing it or having any early am or nocturnal exacerbations of coughing/wheezing/or dyspnea. Pt also denies any obvious fluctuation in symptoms wiht weather or environmental change or other alleviating or aggravating factors December 17, 2009 2:17 PM Not much change since 2/11.   No real cough.  Justgot over bronchitis.  PCP rx avelox.  Felt congestion in the chest.  First episode of same in past 1.5 yrs.  Now pt is well.  No new issues. Pt is less dyspneic.  No chest pain is noted.   June 09, 2010 3:14 PM No real change since 8/11.  Dx Pna and over this two weeks ago. rx as outpt per PCP Now no cough.  Dyspnea is the same.  No edema in the feet.  No real pndrip.  Had a uri after pna.  At night, the mucus will come up in the throat and mucus comes up and will spit out. Has sour brash taste in mouth when awakens.  Preventive Screening-Counseling & Management  Alcohol-Tobacco     Smoking Status: quit > 6 months     Year Quit: 2006     Pack years: 50 years x1 pack q2-3days  Current Medications (verified): 1)  Synthroid 112 Mcg Tabs (  Levothyroxine Sodium) .... Take 1 Tablet By Mouth Once A Day 2)  Metoprolol Tartrate 50 Mg Tabs (Metoprolol Tartrate) .... Take One Tablet Two Times A Day 3)  Vitamin D 04540 Unit Caps (Ergocalciferol) .Marland Kitchen.. 1 By Mouth Monthly 4)  Omeprazole 20 Mg Tbec (Omeprazole) .... One By Mouth Once Daily 5)  Proair Hfa 108 (90 Base) Mcg/act Aers (Albuterol Sulfate) .... As Needed 6)  Calcium-Vitamin D 600-200 Mg-Unit Tabs (Calcium-Vitamin D) .... Take 1 Tablet By Mouth Once A Day 7)  Lipitor 20 Mg Tabs (Atorvastatin Calcium) .... One Daily 8)  Amlodipine Besylate 5 Mg Tabs (Amlodipine Besylate) .Marland Kitchen.. 1 By Mouth Daily 9)  Symbicort 160-4.5 Mcg/act  Aero (Budesonide-Formoterol  Fumarate) .... Two Puffs Twice Daily 10)  Aspirin 81 Mg Tabs (Aspirin) .... Take One Tablet By Mouth Once Daily. 11)  Losartan Potassium 100 Mg Tabs (Losartan Potassium) .... Once Daily 12)  Boniva 150 Mg Tabs (Ibandronate Sodium) .... Once Monthly 13)  Multivitamins  Tabs (Multiple Vitamin) .... Take 1 Tablet By Mouth Once A Day  Allergies (verified): 1)  ! Pcn 2)  ! Codeine  Past History:  Past medical, surgical, family and social histories (including risk factors) reviewed, and no changes noted (except as noted below).  Past Medical History: Reviewed history from 05/06/2010 and no changes required. 1. EMPHYSEMA : Followed by Dr. Delford Field. 2.  Thoracic aortic aneurysm repair 1/10 also with bioprosthetic aortic valve replacement [facility]).  In 10/10, she had a descending thoracic aorta aneurysm and abdominal aortic aneurysm repair [facility]).  Vascular surgeon is Dr. Ty Hilts.  3.  Pulmonary embolism 2010.  She is no longer taking coumadin.  4.  SVT: Short runs seen on prior holter. 5.  Hypothyroidism 6.  R eye blind 7.  Hyperlipidemia 8.  Bioprosthetic aortic valve: Echo (8/11) with EF 55-60%, mild-mod MR, mild-mod TR, poorly visualized bioprosthetic aortic valve but no significant regurgitation and gradient not significantly elevated.  9.  CKD 10.  HTN 11.  Carotid dopplers (8/11): No significant stenosis  Past Surgical History: Reviewed history from 03/11/2009 and no changes required. Back surgery  1970 TAA repair 1/10 AVR 1/10 Thoracoabdominal aneurysm  Repair 01/28/2009   Past Pulmonary History:  Pulmonary History: COPD primary emphysema  Pulmonary Function Test  Date: 09/20/2007 Gender: Female  Pre-Spirometry  FVC     Value: 1.50 L/min   Pred: 2.28 L/min     % Pred: 66 % FEV1     Value: 0.95 L     Pred: 1.83 L     % Pred: 52 % FEV1/FVC   Value: 63 %     Pred: 83 %     FEF 25-75   Value: 0.46 L/min   Pred: 1.94 L/min     % Pred: 23 %  Post-Spirometry  FVC      Value: 1.67 L/min   Pred: 2.28 L/min     % Pred: 73 % FEV1     Value: 1.10 L     Pred: 1.83 L     % Pred: 60 % FEV1/FVC   Value: 66 %     Pred: 83 %     FEF 25-75   Value: 0.58 L/min   Pred: 1.94 L/min     % Pred: 30 %  Lung Volumes  DLCO     Value: 16.1 %   % Pred: 101 % DLCO/VA   Value: 4.50 %   % Pred: 127 %  Evaluation:  severe obstruction with significant  bronchodilator response  Pulmonary Function Test  Date: 12/26/2008 Height (in.): 61 Gender: Female  Pre-Spirometry  FVC     Value: 1.31 L/min   Pred: 2.49 L/min     % Pred: 53 % FEV1     Value: 0.94 L     Pred: 1.86 L     % Pred: 50 % FEV1/FVC   Value: 72 %     Pred: 75 %     % Pred: 96 % FEF 25-75   Value: 0.57 L/min   Pred: 1.54 L/min     % Pred: 37 %  Post-Spirometry  FVC     Value: 1.29 L/min   Pred: 2.49 L/min     % Pred: 52 % FEV1     Value: 0.96 L     Pred: 1.86 L     % Pred: 52 % FEV1/FVC   Value: 75 %     Pred: 75 %     % Pred: 100 % FEF 25-75   Value: 0.66 L/min   Pred: 1.54 L/min     % Pred: 43 %  Lung Volumes  TLC     Value: 3.58 L   % Pred: 78 % RV     Value: 61 L   % Pred: 136 % DLCO     Value: 8.63 %   % Pred: 43 % DLCO/VA   Value: 3.44 %   % Pred: 78 %  Comments:  Severe airflow obstruction,  moderate airtrapping,  severe reduction in DLCO consistent with known dx of emphysema  Family History: Reviewed history from 11/04/2009 and no changes required. Brain cancer--MGM No premature CAD  Social History: Reviewed history from 11/04/2009 and no changes required. Quit smoking in 2006. 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   Review of Systems       The patient complains of shortness of breath with activity and acid heartburn.  The patient denies shortness of breath at rest, productive cough, non-productive cough, coughing up blood, chest pain, irregular heartbeats, indigestion, loss of appetite, weight change, abdominal pain, difficulty swallowing, sore throat, tooth/dental problems,  headaches, nasal congestion/difficulty breathing through nose, sneezing, itching, ear ache, anxiety, depression, hand/feet swelling, joint stiffness or pain, rash, change in color of mucus, and fever.    Vital Signs:  Patient profile:   77 year old female Height:      61 inches Weight:      130 pounds BMI:     24.65 O2 Sat:      96 % on Room air Temp:     97.5 degrees F oral Pulse rate:   58 / minute BP sitting:   124 / 70  (left arm) Cuff size:   regular  Vitals Entered By: Gweneth Dimitri RN (June 09, 2010 2:49 PM)  O2 Flow:  Room air CC: 6 month follow up.  Pt states she was recently treated for pna by PCP approx 2 1/2 wks ago.  Pt states breathing is unchanged from last OV - SOB when lifting heavy objects or walking too fast.  Occas wheezing.  Denies chest tightness and cough. Comments Medications reviewed with patient Daytime contact number verified with patient. Gweneth Dimitri RN  June 09, 2010 2:49 PM    Physical Exam  Additional Exam:  Gen: Pleasant, well nourished, in no distress ENT: no lesions, no postnasal drip Neck: No JVD, no TMG, no carotid bruits Lungs: no use of accessory muscles, no dullness to percussion, clear without rales or  rhonchi Cardiovascular: RRR, heart sounds normal, no murmurs or gallops, no peripheral edema Musculoskeletal: No deformities, no cyanosis or clubbing     Impression & Recommendations:  Problem # 1:  COPD (ICD-496) Assessment Unchanged  Stable copd, GERD is an issue plan  cont inhaled meds follow reflux diet  Medications Added to Medication List This Visit: 1)  Calcium-vitamin D 600-200 Mg-unit Tabs (Calcium-vitamin d) .... Take 1 tablet by mouth once a day 2)  Multivitamins Tabs (Multiple vitamin) .... Take 1 tablet by mouth once a day  Complete Medication List: 1)  Synthroid 112 Mcg Tabs (Levothyroxine sodium) .... Take 1 tablet by mouth once a day 2)  Metoprolol Tartrate 50 Mg Tabs (Metoprolol tartrate) .... Take one  tablet two times a day 3)  Vitamin D 16109 Unit Caps (Ergocalciferol) .Marland Kitchen.. 1 by mouth monthly 4)  Omeprazole 20 Mg Tbec (Omeprazole) .... One by mouth once daily 5)  Proair Hfa 108 (90 Base) Mcg/act Aers (Albuterol sulfate) .... As needed 6)  Calcium-vitamin D 600-200 Mg-unit Tabs (Calcium-vitamin d) .... Take 1 tablet by mouth once a day 7)  Lipitor 20 Mg Tabs (Atorvastatin calcium) .... One daily 8)  Amlodipine Besylate 5 Mg Tabs (Amlodipine besylate) .Marland Kitchen.. 1 by mouth daily 9)  Symbicort 160-4.5 Mcg/act Aero (Budesonide-formoterol fumarate) .... Two puffs twice daily 10)  Aspirin 81 Mg Tabs (Aspirin) .... Take one tablet by mouth once daily. 11)  Losartan Potassium 100 Mg Tabs (Losartan potassium) .... Once daily 12)  Boniva 150 Mg Tabs (Ibandronate sodium) .... Once monthly 13)  Multivitamins Tabs (Multiple vitamin) .... Take 1 tablet by mouth once a day  Other Orders: Est. Patient Level III (60454)  Patient Instructions: 1)  Reflux diet 2)  No change in medications 3)  Return in     4-5      months Prescriptions: SYMBICORT 160-4.5 MCG/ACT  AERO (BUDESONIDE-FORMOTEROL FUMARATE) Two puffs twice daily  #1 x 6   Entered and Authorized by:   Storm Frisk MD   Signed by:   Storm Frisk MD on 06/09/2010   Method used:   [facility]  [address] [id number]       [address]       [place], [place]  [address]       Ph: [phone] or [phone]       Fax: [phone]   RxID:   [id number] PROAIR HFA 108 (90 BASE) MCG/ACT AERS (ALBUTEROL SULFATE) as needed  #1 x 6   Entered and Authorized by:   Storm Frisk MD   Signed by:   Storm Frisk MD on 06/09/2010   Method used:   [facility]        [facility]  [address] [id number]       [address]       [place], [place]  [address]       Ph: [phone] or [phone]       Fax: [phone]   RxID:   [id number]   Appended Document:  Pulmonary OV fax richard pang

## 2010-06-23 NOTE — Progress Notes (Signed)
Summary: Change Symbicort to covered [facility]  Phone Note Outgoing Call   Call placed by: Michel Bickers CMA,  June 16, 2010 9:47 AM Call placed to: [facility] [phone] Summary of Call: Called for PA on Symbicort.  This medication is not covered by her plan and an appeal would be needed (OR) can change to either Advair or Dulera. Pls advise if one of the alternatives would be appropriate for this patient.  Member ID# is [id number]  Msg left for pt to CB. PW is out of the office and need to make sure pt has samples of Symbicort until PW can review.  LMOMTCB. Initial call taken by: Michel Bickers CMA,  June 16, 2010 9:49 AM  Follow-up for Phone Call        pt returned call from [facility]. Tivis Ringer, CNA  June 16, 2010 11:23 AM  Pt says she only has enough medication for 2-3 days. I have left a sample of Symbicort for the patient to pick up and we will let her know what Dr. Delford Field decides after he is back and can review. Pt had verbalized understanding of this. Follow-up by: Michel Bickers CMA,  June 16, 2010 11:36 AM  Additional Follow-up for Phone Call Additional follow up Details #1::        change over to dulera 100 stn two puff two times a day  Additional Follow-up by: Storm Frisk MD,  June 16, 2010 4:17 PM    Additional Follow-up for Phone Call Additional follow up Details #2::    LMOMTCB.  [facility] [address]  Michel Bickers CMA  June 17, 2010 10:03 AM  Pt advised. Carron Curie CMA  June 17, 2010 12:13 PM   New/Updated Medications: DULERA 100-5 MCG/ACT AERO (MOMETASONE FURO-FORMOTEROL FUM) 2 puffs two times a day, rinse mouth after using each time Prescriptions: DULERA 100-5 MCG/ACT AERO (MOMETASONE FURO-FORMOTEROL FUM) 2 puffs two times a day, rinse mouth after using each time  #1 x 6   Entered by:   Michel Bickers CMA   Authorized by:   Storm Frisk MD   Signed by:   Michel Bickers CMA on 06/17/2010   Method used:    [facility]  [address] [id number]       [address]       [place], [place]  [address]       Ph: [phone] or [phone]       Fax: [phone]   RxID:   [id number]

## 2010-07-21 ENCOUNTER — Telehealth: Payer: Self-pay | Admitting: Cardiology

## 2010-07-23 NOTE — Telephone Encounter (Signed)
[address] °

## 2010-07-24 ENCOUNTER — Other Ambulatory Visit: Payer: Self-pay | Admitting: Cardiology

## 2010-07-25 MED ORDER — ATORVASTATIN CALCIUM 20 MG PO TABS: 20.0000 mg | ORAL_TABLET | Freq: Every day | ORAL | Status: DC

## 2010-07-27 NOTE — Telephone Encounter (Signed)
Pt waiting on refill, [facility]s [facility] [address] -pt out, was [facility] to [facility] until today

## 2010-09-30 ENCOUNTER — Encounter: Payer: Self-pay | Admitting: Cardiology

## 2010-10-13 ENCOUNTER — Ambulatory Visit (INDEPENDENT_AMBULATORY_CARE_PROVIDER_SITE_OTHER): Payer: Medicare Other | Admitting: Critical Care Medicine

## 2010-10-13 ENCOUNTER — Encounter: Payer: Self-pay | Admitting: Critical Care Medicine

## 2010-10-13 VITALS — BP 114/64 | HR 57 | Temp 98.2°F | Ht 61.0 in | Wt 129.4 lb

## 2010-10-13 DIAGNOSIS — J449 Chronic obstructive pulmonary disease, unspecified: Secondary | ICD-10-CM

## 2010-10-13 MED ORDER — MOMETASONE FURO-FORMOTEROL FUM 100-5 MCG/ACT IN AERO: 2.0000 | INHALATION_SPRAY | Freq: Two times a day (BID) | RESPIRATORY_TRACT | Status: DC

## 2010-10-13 NOTE — Progress Notes (Signed)
Subjective:    Patient ID: Sarah Johnson, female    DOB: Aug 28, 1932, 77 y.o.   MRN: [id number]  HPI This is a 77 y.o.  WF  woman with COPD and primary emphysema.  Pt dx with COPD and primary emphysema 6/09 when being eval for Thoracic aortic aneurysm   June 09, 2010 3:14 PM  No real change since 8/11. Dx Pna and over this two weeks ago. rx as outpt per PCP  Now no cough. Dyspnea is the same. No edema in the feet. No real pndrip. Had a uri after pna. At night, the mucus will come up in the throat and mucus comes up and will spit out. Has sour brash taste in mouth when awakens.   10/13/2010 Dyspnea is the same from 2/12.  Not much cough now.  Notes min wheeze.  No qhs dyspnea.  No edema in feet.   No smoking   Past Medical History  Diagnosis Date  . Emphysema     followed by Dr. Delford Field  . S/P thoracic aortic aneurysm repair 1/10    also with bioprosthetic aortic valve prelacement [facility]). in 10/10 she has a descending thoraic aorta anuerysm and abdominal reapir [facility]). Vascular urgeon is Dr. Ty Hilts.   . Pulmonary embolism 2010    She is no onger taking coumadin  . SVT (supraventricular tachycardia)     short run seen on prior holter  . Hypothyroidism   . Blind right eye   . Hyperlipidemia   . S/P aortic valve replacement with bioprosthetic valve     ehco (8/11) with EF 55-60%, mild-mod MR, mild-mod TR, poorly visualized bioprosthetic aortic valve but no significant regurgitation and gradient not significanly elevated.    . CKD (chronic kidney disease)   . HTN (hypertension)   . History of Doppler echocardiogram     carotid dopplers. (8/11) no significant stenosis  . COPD (chronic obstructive pulmonary disease)      Family History  Problem Relation Age of Onset  . Brain cancer Maternal Grandmother   . Coronary artery disease      no premature CAD     History   Social History  . Marital Status: Married    Spouse Name: N/A    Number of Children: N/A    . Years of Education: N/A   Occupational History  . Not on file.   Social History Main Topics  . Smoking status: Former Smoker -- 1.0 packs/day for 20 years    Types: Cigarettes    Quit date: 04/26/2004  . Smokeless tobacco: Never Used   Comment: quit in 2006  . Alcohol Use: Not on file  . Drug Use: Not on file  . Sexually Active: Not on file   Other Topics Concern  . 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 Codeine   . Penicillins      Outpatient Prescriptions Prior to Visit  Medication Sig Dispense Refill  . albuterol (PROAIR HFA) 108 (90 BASE) MCG/ACT inhaler Inhale 2 puffs into the lungs every 6 (six) hours as needed.        Marland Kitchen amLODipine (NORVASC) 5 MG tablet Take 5 mg by mouth daily.        Marland Kitchen aspirin 81 MG tablet One tablet on M, W, F, Sat      . atorvastatin (LIPITOR) 20 MG tablet Take 1 tablet (20 mg  total) by mouth daily.  30 tablet  11  . Calcium Carbonate-Vitamin D (CALCIUM-VITAMIN D3) 600-200 MG-UNIT TABS Take by mouth daily.        . ergocalciferol (VITAMIN D2) 50000 UNITS capsule Take 50,000 Units by mouth every 30 (thirty) days.        Marland Kitchen levothyroxine (SYNTHROID, LEVOTHROID) 112 MCG tablet Take 112 mcg by mouth daily.        Marland Kitchen losartan (COZAAR) 100 MG tablet Take 100 mg by mouth daily.        . metoprolol (LOPRESSOR) 50 MG tablet Take 50 mg by mouth 2 (two) times daily.        . Multiple Vitamin (MULTIVITAMIN) tablet Take 1 tablet by mouth daily.        Marland Kitchen omeprazole (PRILOSEC) 20 MG capsule Take 20 mg by mouth daily.        Marland Kitchen atorvastatin (LIPITOR) 20 MG tablet TAKE 1 TABLET BY MOUTH EVERY DAY  30 tablet  3  . Mometasone Furo-Formoterol Fum (DULERA) 100-5 MCG/ACT AERO Inhale 2 puffs into the lungs 2 (two) times daily. Rinse mouth after each use       . atorvastatin (LIPITOR) 20 MG tablet TAKE 1 TABLET BY MOUTH EVERY DAY  30 tablet  0  . ibandronate (BONIVA) 150 MG tablet  Take 150 mg by mouth every 30 (thirty) days. Take in the morning with a full glass of water, on an empty stomach, and do not take anything else by mouth or lie down for the next 30 min.          Review of Systems  Constitutional:   No  weight loss, night sweats,  Fevers, chills, fatigue, lassitude. HEENT:   No headaches,  Difficulty swallowing,  Tooth/dental problems,  Sore throat,                No sneezing, itching, ear ache, nasal congestion, post nasal drip,   CV:  No chest pain,  Orthopnea, PND, swelling in lower extremities, anasarca, dizziness, palpitations  GI  No heartburn, indigestion, abdominal pain, nausea, vomiting, diarrhea, change in bowel habits, loss of appetite  Resp: No shortness of breath with exertion or at rest.  No excess mucus, no productive cough,  No non-productive cough,  No coughing up of blood.  No change in color of mucus.  No wheezing.  No chest wall deformity  Skin: no rash or lesions.  GU: no dysuria, change in color of urine, no urgency or frequency.  No flank pain.  MS:  No joint pain or swelling.  No decreased range of motion.  No back pain.  Psych:  No change in mood or affect. No depression or anxiety.  No memory loss.  Past Pulmonary History:  Pulmonary History:  COPD primary emphysema  Pulmonary Function Test  Date: 09/20/2007  Gender: Female  Pre-Spirometry  FVC Value: 1.50 L/min Pred: 2.28 L/min % Pred: 66 %  FEV1 Value: 0.95 L Pred: 1.83 L % Pred: 52 %  FEV1/FVC Value: 63 % Pred: 83 % FEF 25-75 Value: 0.46 L/min Pred: 1.94 L/min % Pred: 23 %  Post-Spirometry  FVC Value: 1.67 L/min Pred: 2.28 L/min % Pred: 73 %  FEV1 Value: 1.10 L Pred: 1.83 L % Pred: 60 %  FEV1/FVC Value: 66 % Pred: 83 % FEF 25-75 Value: 0.58 L/min Pred: 1.94 L/min % Pred: 30 %  Lung Volumes  DLCO Value: 16.1 % % Pred: 101 %  DLCO/VA Value: 4.50 % % Pred: 127 %  Evaluation:  severe obstruction with significant bronchodilator response    Pulmonary Function Test    Date: 12/26/2008  Height (in.): 61  Gender: Female  Pre-Spirometry  FVC Value: 1.31 L/min Pred: 2.49 L/min % Pred: 53 %  FEV1 Value: 0.94 L Pred: 1.86 L % Pred: 50 %  FEV1/FVC Value: 72 % Pred: 75 % % Pred: 96 %  FEF 25-75 Value: 0.57 L/min Pred: 1.54 L/min % Pred: 37 %  Post-Spirometry  FVC Value: 1.29 L/min Pred: 2.49 L/min % Pred: 52 %  FEV1 Value: 0.96 L Pred: 1.86 L % Pred: 52 %  FEV1/FVC Value: 75 % Pred: 75 % % Pred: 100 %  FEF 25-75 Value: 0.66 L/min Pred: 1.54 L/min % Pred: 43 %  Lung Volumes  TLC Value: 3.58 L % Pred: 78 %  RV Value: 61 L % Pred: 136 %  DLCO Value: 8.63 % % Pred: 43 %  DLCO/VA Value: 3.44 % % Pred: 78 %  Comments:  Severe airflow obstruction, moderate airtrapping, severe reduction in DLCO consistent with known dx of emphysema      Objective:   Physical Exam Filed Vitals:   10/13/10 1505  BP: 114/64  Pulse: 57  Temp: 98.2 F (36.8 C)  TempSrc: Oral  Height: 5\' 1"  (1.549 m)  Weight: 129 lb 6.4 oz (58.695 kg)  SpO2: 96%    Gen: Pleasant, well-nourished, in no distress,  normal affect  ENT: No lesions,  mouth clear,  oropharynx clear, no postnasal drip  Neck: No JVD, no TMG, no carotid bruits  Lungs: No use of accessory muscles, no dullness to percussion, distant BS  Cardiovascular: RRR, heart sounds normal, no murmur or gallops, no peripheral edema  Abdomen: soft and NT, no HSM,  BS normal  Musculoskeletal: No deformities, no cyanosis or clubbing  Neuro: alert, non focal  Skin: Warm, no lesions or rashes        Assessment & Plan:   COPD Copd stable at present Plan No change in inhaled or maintenance medications. Return in 4 months    Updated Medication List Outpatient Encounter Prescriptions as of 10/13/2010  Medication Sig Dispense Refill  . albuterol (PROAIR HFA) 108 (90 BASE) MCG/ACT inhaler Inhale 2 puffs into the lungs every 6 (six) hours as needed.        Marland Kitchen amLODipine (NORVASC) 5 MG tablet Take 5 mg by mouth daily.         Marland Kitchen aspirin 81 MG tablet One tablet on M, W, F, Sat      . atorvastatin (LIPITOR) 20 MG tablet Take 1 tablet (20 mg total) by mouth daily.  30 tablet  11  . Calcium Carbonate-Vitamin D (CALCIUM-VITAMIN D3) 600-200 MG-UNIT TABS Take by mouth daily.        . ergocalciferol (VITAMIN D2) 50000 UNITS capsule Take 50,000 Units by mouth every 30 (thirty) days.        Marland Kitchen levothyroxine (SYNTHROID, LEVOTHROID) 112 MCG tablet Take 112 mcg by mouth daily.        Marland Kitchen losartan (COZAAR) 100 MG tablet Take 100 mg by mouth daily.        . metoprolol (LOPRESSOR) 50 MG tablet Take 50 mg by mouth 2 (two) times daily.        . Mometasone Furo-Formoterol Fum (DULERA) 100-5 MCG/ACT AERO Inhale 2 puffs into the lungs 2 (two) times daily. Rinse mouth after each use  1 Inhaler  6  . Multiple Vitamin (MULTIVITAMIN) tablet Take 1 tablet by mouth daily.        Marland Kitchen  omeprazole (PRILOSEC) 20 MG capsule Take 20 mg by mouth daily.        Marland Kitchen DISCONTD: atorvastatin (LIPITOR) 20 MG tablet TAKE 1 TABLET BY MOUTH EVERY DAY  30 tablet  3  . DISCONTD: Mometasone Furo-Formoterol Fum (DULERA) 100-5 MCG/ACT AERO Inhale 2 puffs into the lungs 2 (two) times daily. Rinse mouth after each use       . DISCONTD: atorvastatin (LIPITOR) 20 MG tablet TAKE 1 TABLET BY MOUTH EVERY DAY  30 tablet  0  . DISCONTD: ibandronate (BONIVA) 150 MG tablet Take 150 mg by mouth every 30 (thirty) days. Take in the morning with a full glass of water, on an empty stomach, and do not take anything else by mouth or lie down for the next 30 min.

## 2010-10-13 NOTE — Patient Instructions (Signed)
No change in medications. Return in      5 months 

## 2010-10-14 NOTE — Assessment & Plan Note (Signed)
Copd stable at present Plan No change in inhaled or maintenance medications. Return in  4 months 

## 2010-10-26 ENCOUNTER — Other Ambulatory Visit: Payer: Self-pay | Admitting: Internal Medicine

## 2010-10-26 DIAGNOSIS — M546 Pain in thoracic spine: Secondary | ICD-10-CM

## 2010-11-03 ENCOUNTER — Ambulatory Visit
Admission: RE | Admit: 2010-11-03 | Discharge: 2010-11-03 | Disposition: A | Discharge status: Home / Self Care | Payer: Medicare Other | Source: Ambulatory Visit | Attending: Internal Medicine | Admitting: Internal Medicine

## 2010-11-03 DIAGNOSIS — M546 Pain in thoracic spine: Secondary | ICD-10-CM

## 2010-11-04 ENCOUNTER — Encounter: Payer: Self-pay | Admitting: Cardiology

## 2010-11-04 ENCOUNTER — Ambulatory Visit (INDEPENDENT_AMBULATORY_CARE_PROVIDER_SITE_OTHER): Payer: Medicare Other | Admitting: Cardiology

## 2010-11-04 DIAGNOSIS — I719 Aortic aneurysm of unspecified site, without rupture: Secondary | ICD-10-CM

## 2010-11-04 DIAGNOSIS — I10 Essential (primary) hypertension: Secondary | ICD-10-CM

## 2010-11-04 DIAGNOSIS — E785 Hyperlipidemia, unspecified: Secondary | ICD-10-CM

## 2010-11-04 DIAGNOSIS — Z79899 Other long term (current) drug therapy: Secondary | ICD-10-CM

## 2010-11-04 DIAGNOSIS — I359 Nonrheumatic aortic valve disorder, unspecified: Secondary | ICD-10-CM

## 2010-11-04 DIAGNOSIS — Z952 Presence of prosthetic heart valve: Secondary | ICD-10-CM

## 2010-11-04 DIAGNOSIS — E7800 Pure hypercholesterolemia, unspecified: Secondary | ICD-10-CM

## 2010-11-04 NOTE — Patient Instructions (Addendum)
Your physician recommends that you schedule a follow-up appointment in: 6 MONTHS WITH DR [facility]  Your physician recommends that you continue on your current medications as directed. Please refer to the Current Medication list given to you today.  Your physician recommends that you return for lab work in: 2 WEEKS FASTING LIPID LIVER DX 272.0 V58.69  11-18-10 AT 8:30 AM

## 2010-11-05 DIAGNOSIS — Z952 Presence of prosthetic heart valve: Secondary | ICD-10-CM | POA: Insufficient documentation

## 2010-11-05 NOTE — Assessment & Plan Note (Signed)
Status post extensive surgery.  Followup CTs at [facility] are being done regularly.  Need to ensure good BP control.

## 2010-11-05 NOTE — Assessment & Plan Note (Signed)
Patient is status post bioprosthetic AVR and extensive replacement of the thoracic and abdominal aorta.  Echo did not show significant abnormality of the valve in 8/11.  She should take ASA 81 mg daily.

## 2010-11-05 NOTE — Assessment & Plan Note (Signed)
Will check lipids today (fasting).

## 2010-11-05 NOTE — Assessment & Plan Note (Signed)
Needs good BP control s/p extensive aortic aneurysm repair.  BP has been at goal at home.  Continue current meds.

## 2010-11-05 NOTE — Progress Notes (Signed)
PCP: Dr. Ricki Miller  77 yo with history of bioprosthetic aortic valve replacement as well as extensive aortic aneurym repair involving the ascending and descending thoracic aorta as well as the abdominal aorta presents for cardiology followup.  Patient had her initial operation (AVR and ascending thoracic aortic aneurysm repair) in 1/10.  In 10/10, she had repair of the descending thoracic and abdominal aorta.  She has done well post-operatively and follows closely with Dr. Ty Hilts at [facility], where she had the operations.  She has moderate COPD and is followed by Dr. Delford Field.  She is not on home oxygen.  Echo in 8/11 showed normal systolic function and the aortic valve was poorly visualized but likely ok.    Patient denies any increase in her chronic exertional dyspnea.  She can walk 2 blocks then is short of breath.  She is able to climb a flight of steps.  No chest pain.  BP has been well-controlled when she checks it at home.  Main complaint has been back pain that seems to be musculoskeletal.  She recently had an MRI.   She is not taking ASA daily because of some bruising.  She saw her surgeon at [facility] in 6/12 and says that she had a CT of her aorta that was "ok."   ECG: NSR at 54, normal  Labs (4/11): K 4.7, creatinine 1.5 Labs (7/11): LDL 109, HDL 61 Labs (9/11): LDL 114, HDL 63  Allergies (verified):  1)  ! Pcn 2)  ! Codeine  Past Medical History: 1. EMPHYSEMA : Followed by Dr. Delford Field. 2.  Thoracic aortic aneurysm repair 1/10 also with bioprosthetic aortic valve replacement [facility]).  In 10/10, she had a descending thoracic aorta aneurysm and abdominal aortic aneurysm repair [facility]).  Vascular surgeon is Dr. Ty Hilts.  3.  Pulmonary embolism 2010.  She is no longer taking coumadin.  4.  SVT: Short runs seen on prior holter. 5.  Hypothyroidism 6.  R eye blind 7.  Hyperlipidemia 8.  Bioprosthetic aortic valve: Echo (8/11) with EF 55-60%, mild-mod MR, mild-mod TR, poorly  visualized bioprosthetic aortic valve but no significant regurgitation and gradient not significantly elevated.  9.  CKD 10.  HTN 11.  Carotid dopplers (8/11): No significant stenosis  Family History: Brain cancer--MGM No premature CAD  Social History: Quit smoking in 2006. 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   Current Outpatient Prescriptions  Medication Sig Dispense Refill  . albuterol (PROAIR HFA) 108 (90 BASE) MCG/ACT inhaler Inhale 2 puffs into the lungs every 6 (six) hours as needed.        Marland Kitchen amLODipine (NORVASC) 5 MG tablet Take 5 mg by mouth daily.        Marland Kitchen aspirin 81 MG tablet One tablet on M, W, F, Sat      . atorvastatin (LIPITOR) 20 MG tablet Take 1 tablet (20 mg total) by mouth daily.  30 tablet  11  . Calcium Carbonate-Vitamin D (CALCIUM-VITAMIN D3) 600-200 MG-UNIT TABS Take by mouth daily.        . ergocalciferol (VITAMIN D2) 50000 UNITS capsule Take 50,000 Units by mouth every 30 (thirty) days.        Marland Kitchen levothyroxine (SYNTHROID, LEVOTHROID) 112 MCG tablet Take 112 mcg by mouth daily.        Marland Kitchen losartan (COZAAR) 100 MG tablet Take 100 mg by mouth daily.        . metoprolol (LOPRESSOR) 50 MG tablet Take 50 mg by mouth 2 (two)  times daily.        . Mometasone Furo-Formoterol Fum (DULERA) 100-5 MCG/ACT AERO Inhale 2 puffs into the lungs 2 (two) times daily. Rinse mouth after each use  1 Inhaler  6  . Multiple Vitamin (MULTIVITAMIN) tablet Take 1 tablet by mouth daily.        Marland Kitchen omeprazole (PRILOSEC) 20 MG capsule Take 20 mg by mouth daily.          BP 135/62  Pulse 57  Ht 5\' 1"  (1.549 m)  Wt 128 lb 12.8 oz (58.423 kg)  BMI 24.34 kg/m2 General:  Well developed, well nourished, in no acute distress. Neck:  Neck supple, no JVD. No masses, thyromegaly or abnormal cervical nodes. Lungs:  Slightly decreased breath sounds bilaterally, no crackles.  Heart:  Non-displaced PMI, chest non-tender; regular rate and rhythm, S1, S2 without rubs or gallops. 2/6 early  systolic murmur.  Carotid upstroke normal, left carotid bruit.  Pedals normal pulses. No edema, no varicosities. Abdomen:  Bowel sounds positive; abdomen soft and non-tender without masses, organomegaly, or hernias noted. No hepatosplenomegaly. Extremities:  No clubbing or cyanosis. Neurologic:  Alert and oriented x 3. Psych:  Normal affect.

## 2010-11-11 ENCOUNTER — Other Ambulatory Visit (HOSPITAL_COMMUNITY): Payer: Self-pay | Admitting: Internal Medicine

## 2010-11-11 DIAGNOSIS — Z1231 Encounter for screening mammogram for malignant neoplasm of breast: Secondary | ICD-10-CM

## 2010-11-18 ENCOUNTER — Other Ambulatory Visit (INDEPENDENT_AMBULATORY_CARE_PROVIDER_SITE_OTHER): Payer: Medicare Other | Admitting: *Deleted

## 2010-11-18 DIAGNOSIS — Z79899 Other long term (current) drug therapy: Secondary | ICD-10-CM

## 2010-11-18 DIAGNOSIS — E7800 Pure hypercholesterolemia, unspecified: Secondary | ICD-10-CM

## 2010-11-18 LAB — HEPATIC FUNCTION PANEL
ALT: 22 U/L (ref 0–35)
Bilirubin, Direct: 0.1 mg/dL (ref 0.0–0.3)
Total Bilirubin: 0.7 mg/dL (ref 0.3–1.2)

## 2010-11-18 LAB — LIPID PANEL: VLDL: 16 mg/dL (ref 0.0–40.0)

## 2010-11-24 ENCOUNTER — Telehealth: Payer: Self-pay | Admitting: Cardiology

## 2010-11-27 NOTE — Telephone Encounter (Signed)
Pt returning your call for 3-4 days ago.

## 2010-11-27 NOTE — Telephone Encounter (Signed)
I talked with pt about recent lab results 

## 2010-12-21 ENCOUNTER — Ambulatory Visit (HOSPITAL_COMMUNITY): Payer: Medicare Other

## 2010-12-23 ENCOUNTER — Ambulatory Visit (HOSPITAL_COMMUNITY)
Admission: RE | Admit: 2010-12-23 | Discharge: 2010-12-23 | Disposition: A | Discharge status: Home / Self Care | Payer: Medicare Other | Source: Ambulatory Visit | Attending: Internal Medicine | Admitting: Internal Medicine

## 2010-12-23 ENCOUNTER — Other Ambulatory Visit: Payer: Self-pay | Admitting: Specialist

## 2010-12-23 DIAGNOSIS — M545 Low back pain: Secondary | ICD-10-CM

## 2010-12-23 DIAGNOSIS — Z1231 Encounter for screening mammogram for malignant neoplasm of breast: Secondary | ICD-10-CM | POA: Insufficient documentation

## 2011-01-01 ENCOUNTER — Ambulatory Visit
Admission: RE | Admit: 2011-01-01 | Discharge: 2011-01-01 | Disposition: A | Discharge status: Home / Self Care | Payer: Medicare Other | Source: Ambulatory Visit | Attending: Specialist | Admitting: Specialist

## 2011-01-01 DIAGNOSIS — M545 Low back pain: Secondary | ICD-10-CM

## 2011-03-16 ENCOUNTER — Encounter: Payer: Self-pay | Admitting: Critical Care Medicine

## 2011-03-16 ENCOUNTER — Ambulatory Visit (INDEPENDENT_AMBULATORY_CARE_PROVIDER_SITE_OTHER): Payer: Medicare Other | Admitting: Critical Care Medicine

## 2011-03-16 VITALS — BP 124/68 | HR 55 | Temp 98.0°F | Ht 62.0 in | Wt 130.2 lb

## 2011-03-16 DIAGNOSIS — J449 Chronic obstructive pulmonary disease, unspecified: Secondary | ICD-10-CM

## 2011-03-16 DIAGNOSIS — J4489 Other specified chronic obstructive pulmonary disease: Secondary | ICD-10-CM

## 2011-03-16 MED ORDER — MOMETASONE FURO-FORMOTEROL FUM 100-5 MCG/ACT IN AERO: 2.0000 | INHALATION_SPRAY | Freq: Two times a day (BID) | RESPIRATORY_TRACT | Status: DC

## 2011-03-16 NOTE — Patient Instructions (Signed)
No change in medications. Return in        6 months        

## 2011-03-16 NOTE — Progress Notes (Signed)
Subjective:    Patient ID: Sarah Johnson, female    DOB: 1932/10/03, 77 y.o.   MRN: [id number]  HPI  This is a 77 y.o.  WF  woman with COPD and primary emphysema.  Pt dx with COPD and primary emphysema 6/09 when being eval for Thoracic aortic aneurysm   June 09, 2010 3:14 PM  No real change since 8/11. Dx Pna and over this two weeks ago. rx as outpt per PCP  Now no cough. Dyspnea is the same. No edema in the feet. No real pndrip. Had a uri after pna. At night, the mucus will come up in the throat and mucus comes up and will spit out. Has sour brash taste in mouth when awakens.   6/19 Dyspnea is the same from 2/12.  Not much cough now.  Notes min wheeze.  No qhs dyspnea.  No edema in feet.   No smoking   03/16/2011 Copd f/u.  No real changes. No cough. Dyspnea is the same with exertion.  No qhs dyspnea.   Pt denies any significant sore throat, nasal congestion or excess secretions, fever, chills, sweats, unintended weight loss, pleurtic or exertional chest pain, orthopnea PND, or leg swelling Pt denies any increase in rescue therapy over baseline, denies waking up needing it or having any early am or nocturnal exacerbations of coughing/wheezing/or dyspnea. Pt also denies any obvious fluctuation in symptoms with  weather or environmental change or other alleviating or aggravating factors     Past Medical History  Diagnosis Date  . Emphysema     followed by Dr. Delford Field  . S/P thoracic aortic aneurysm repair 1/10    also with bioprosthetic aortic valve prelacement [facility]). in 10/10 she has a descending thoraic aorta anuerysm and abdominal reapir [facility]). Vascular urgeon is Dr. Ty Hilts.   . Pulmonary embolism 2010    She is no onger taking coumadin  . SVT (supraventricular tachycardia)     short run seen on prior holter  . Hypothyroidism   . Blind right eye   . Hyperlipidemia   . S/P aortic valve replacement with bioprosthetic valve     ehco (8/11) with EF  55-60%, mild-mod MR, mild-mod TR, poorly visualized bioprosthetic aortic valve but no significant regurgitation and gradient not significanly elevated.    . CKD (chronic kidney disease)   . HTN (hypertension)   . History of Doppler echocardiogram     carotid dopplers. (8/11) no significant stenosis  . COPD (chronic obstructive pulmonary disease)      Family History  Problem Relation Age of Onset  . Brain cancer Maternal Grandmother   . Coronary artery disease      no premature CAD     History   Social History  . Marital Status: Married    Spouse Name: N/A    Number of Children: N/A  . Years of Education: N/A   Occupational History  . Not on file.   Social History Main Topics  . Smoking status: Former Smoker -- 1.0 packs/day for 20 years    Types: Cigarettes    Quit date: 04/26/2004  . Smokeless tobacco: Never Used   Comment: quit in 2006  . Alcohol Use: Not on file  . Drug Use: Not on file  . Sexually Active: Not on file   Other Topics Concern  . 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  Codeine   . Penicillins      Outpatient Prescriptions Prior to Visit  Medication Sig Dispense Refill  . albuterol (PROAIR HFA) 108 (90 BASE) MCG/ACT inhaler Inhale 2 puffs into the lungs every 6 (six) hours as needed.        Marland Kitchen amLODipine (NORVASC) 5 MG tablet Take 5 mg by mouth daily.        Marland Kitchen aspirin 81 MG tablet One tablet on M, W, F, Sat      . atorvastatin (LIPITOR) 20 MG tablet Take 1 tablet (20 mg total) by mouth daily.  30 tablet  11  . Calcium Carbonate-Vitamin D (CALCIUM-VITAMIN D3) 600-200 MG-UNIT TABS Take by mouth daily.        . ergocalciferol (VITAMIN D2) 50000 UNITS capsule Take 50,000 Units by mouth every 30 (thirty) days.        Marland Kitchen losartan (COZAAR) 100 MG tablet Take 100 mg by mouth daily.        . metoprolol (LOPRESSOR) 50 MG tablet Take 50 mg by mouth 2 (two) times daily.         . Mometasone Furo-Formoterol Fum (DULERA) 100-5 MCG/ACT AERO Inhale 2 puffs into the lungs 2 (two) times daily. Rinse mouth after each use  1 Inhaler  6  . Multiple Vitamin (MULTIVITAMIN) tablet Take 1 tablet by mouth daily.        Marland Kitchen omeprazole (PRILOSEC) 20 MG capsule Take 20 mg by mouth daily.        Marland Kitchen levothyroxine (SYNTHROID, LEVOTHROID) 112 MCG tablet Take 112 mcg by mouth daily.           Review of Systems   Constitutional:   No  weight loss, night sweats,  Fevers, chills, fatigue, lassitude. HEENT:   No headaches,  Difficulty swallowing,  Tooth/dental problems,  Sore throat,                No sneezing, itching, ear ache, nasal congestion, post nasal drip,   CV:  No chest pain,  Orthopnea, PND, swelling in lower extremities, anasarca, dizziness, palpitations  GI  No heartburn, indigestion, abdominal pain, nausea, vomiting, diarrhea, change in bowel habits, loss of appetite  Resp: No shortness of breath with exertion or at rest.  No excess mucus, no productive cough,  No non-productive cough,  No coughing up of blood.  No change in color of mucus.  No wheezing.  No chest wall deformity  Skin: no rash or lesions.  GU: no dysuria, change in color of urine, no urgency or frequency.  No flank pain.  MS:  No joint pain or swelling.  No decreased range of motion.  No back pain.  Psych:  No change in mood or affect. No depression or anxiety.  No memory loss.  Past Pulmonary History:  Pulmonary History:  COPD primary emphysema  Pulmonary Function Test  Date: 09/20/2007  Gender: Female  Pre-Spirometry  FVC Value: 1.50 L/min Pred: 2.28 L/min % Pred: 66 %  FEV1 Value: 0.95 L Pred: 1.83 L % Pred: 52 %  FEV1/FVC Value: 63 % Pred: 83 % FEF 25-75 Value: 0.46 L/min Pred: 1.94 L/min % Pred: 23 %  Post-Spirometry  FVC Value: 1.67 L/min Pred: 2.28 L/min % Pred: 73 %  FEV1 Value: 1.10 L Pred: 1.83 L % Pred: 60 %  FEV1/FVC Value: 66 % Pred: 83 % FEF 25-75 Value: 0.58 L/min Pred: 1.94  L/min % Pred: 30 %  Lung Volumes  DLCO Value: 16.1 % % Pred: 101 %  DLCO/VA Value: 4.50 % % Pred: 127 %  Evaluation:  severe obstruction with significant bronchodilator response    Pulmonary Function Test  Date: 12/26/2008  Height (in.): 61  Gender: Female  Pre-Spirometry  FVC Value: 1.31 L/min Pred: 2.49 L/min % Pred: 53 %  FEV1 Value: 0.94 L Pred: 1.86 L % Pred: 50 %  FEV1/FVC Value: 72 % Pred: 75 % % Pred: 96 %  FEF 25-75 Value: 0.57 L/min Pred: 1.54 L/min % Pred: 37 %  Post-Spirometry  FVC Value: 1.29 L/min Pred: 2.49 L/min % Pred: 52 %  FEV1 Value: 0.96 L Pred: 1.86 L % Pred: 52 %  FEV1/FVC Value: 75 % Pred: 75 % % Pred: 100 %  FEF 25-75 Value: 0.66 L/min Pred: 1.54 L/min % Pred: 43 %  Lung Volumes  TLC Value: 3.58 L % Pred: 78 %  RV Value: 61 L % Pred: 136 %  DLCO Value: 8.63 % % Pred: 43 %  DLCO/VA Value: 3.44 % % Pred: 78 %  Comments:  Severe airflow obstruction, moderate airtrapping, severe reduction in DLCO consistent with known dx of emphysema      Objective:   Physical Exam  Filed Vitals:   03/16/11 1558  BP: 124/68  Pulse: 55  Temp: 98 F (36.7 C)  TempSrc: Oral  Height: 5\' 2"  (1.575 m)  Weight: 130 lb 3.2 oz (59.058 kg)  SpO2: 97%    Gen: Pleasant, well-nourished, in no distress,  normal affect  ENT: No lesions,  mouth clear,  oropharynx clear, no postnasal drip  Neck: No JVD, no TMG, no carotid bruits  Lungs: No use of accessory muscles, no dullness to percussion, distant BS  Cardiovascular: RRR, heart sounds normal, no murmur or gallops, no peripheral edema  Abdomen: soft and NT, no HSM,  BS normal  Musculoskeletal: No deformities, no cyanosis or clubbing  Neuro: alert, non focal  Skin: Warm, no lesions or rashes        Assessment & Plan:   No problem-specific assessment & plan notes found for this encounter.   Updated Medication List Outpatient Encounter Prescriptions as of 03/16/2011  Medication Sig Dispense Refill  .  albuterol (PROAIR HFA) 108 (90 BASE) MCG/ACT inhaler Inhale 2 puffs into the lungs every 6 (six) hours as needed.        Marland Kitchen amLODipine (NORVASC) 5 MG tablet Take 5 mg by mouth daily.        Marland Kitchen aspirin 81 MG tablet One tablet on M, W, F, Sat      . atorvastatin (LIPITOR) 20 MG tablet Take 1 tablet (20 mg total) by mouth daily.  30 tablet  11  . Calcium Carbonate-Vitamin D (CALCIUM-VITAMIN D3) 600-200 MG-UNIT TABS Take by mouth daily.        . ergocalciferol (VITAMIN D2) 50000 UNITS capsule Take 50,000 Units by mouth every 30 (thirty) days.        Marland Kitchen losartan (COZAAR) 100 MG tablet Take 100 mg by mouth daily.        . metoprolol (LOPRESSOR) 50 MG tablet Take 50 mg by mouth 2 (two) times daily.        . Mometasone Furo-Formoterol Fum (DULERA) 100-5 MCG/ACT AERO Inhale 2 puffs into the lungs 2 (two) times daily. Rinse mouth after each use  1 Inhaler  6  . Multiple Vitamin (MULTIVITAMIN) tablet Take 1 tablet by mouth daily.        Marland Kitchen omeprazole (PRILOSEC) 20 MG capsule Take 20 mg by mouth daily.        Marland Kitchen  SYNTHROID 125 MCG tablet Take 1 tablet by mouth Daily.      Marland Kitchen DISCONTD: levothyroxine (SYNTHROID, LEVOTHROID) 112 MCG tablet Take 112 mcg by mouth daily.

## 2011-03-16 NOTE — Assessment & Plan Note (Addendum)
Copd Golds Stage III  Stable  Plan No change in inhaled or maintenance medications. Return in  6 months

## 2011-05-04 ENCOUNTER — Encounter: Payer: Self-pay | Admitting: Cardiology

## 2011-05-04 ENCOUNTER — Ambulatory Visit (INDEPENDENT_AMBULATORY_CARE_PROVIDER_SITE_OTHER): Payer: Medicare Other | Admitting: Cardiology

## 2011-05-04 DIAGNOSIS — J449 Chronic obstructive pulmonary disease, unspecified: Secondary | ICD-10-CM | POA: Diagnosis not present

## 2011-05-04 DIAGNOSIS — I10 Essential (primary) hypertension: Secondary | ICD-10-CM | POA: Diagnosis not present

## 2011-05-04 DIAGNOSIS — N189 Chronic kidney disease, unspecified: Secondary | ICD-10-CM

## 2011-05-04 DIAGNOSIS — Z952 Presence of prosthetic heart valve: Secondary | ICD-10-CM

## 2011-05-04 DIAGNOSIS — I359 Nonrheumatic aortic valve disorder, unspecified: Secondary | ICD-10-CM | POA: Diagnosis not present

## 2011-05-04 DIAGNOSIS — J4489 Other specified chronic obstructive pulmonary disease: Secondary | ICD-10-CM

## 2011-05-04 DIAGNOSIS — I719 Aortic aneurysm of unspecified site, without rupture: Secondary | ICD-10-CM

## 2011-05-04 LAB — BASIC METABOLIC PANEL
GFR: 36.41 mL/min — ABNORMAL LOW (ref >60.00)
Potassium: 4.5 mEq/L (ref 3.5–5.1)
Sodium: 142 mEq/L (ref 135–145)

## 2011-05-04 NOTE — Progress Notes (Signed)
PCP: Dr. Ricki Miller  78 yo with history of bioprosthetic aortic valve replacement as well as extensive aortic aneurym repair involving the ascending and descending thoracic aorta and the abdominal aorta presents for cardiology followup.  Patient had her initial operation (AVR and ascending thoracic aortic aneurysm repair) in 1/10.  In 10/10, she had repair of the descending thoracic and abdominal aorta.  She has done well post-operatively and follows closely with Dr. Ty Hilts at [facility], where she had the operations.  She will be going back there again this summer for CT angiogram to assess the aortic repair.  She has moderate COPD and is followed by Dr. Delford Field.  She is not on home oxygen.  Echo in 8/11 showed normal systolic function and the aortic valve was poorly visualized but likely ok.    Patient denies any increase in her chronic exertional dyspnea.  She can walk 2 blocks then is short of breath.  She is able to climb a flight of steps.  No chest pain.  BP has been under good control.  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  She takes aspirin approximately every other day because she does not like the bruising she gets.  No GI bleed history, no epigastric discomfort with ASA.   Labs (4/11): K 4.7, creatinine 1.5 Labs (7/11): LDL 109, HDL 61 Labs (9/11): LDL 114, HDL 63 Labs (7/12): LDL 87, HDL 68  Allergies (verified):  1)  ! Pcn 2)  ! Codeine  Past Medical History: 1.  COPD : Followed by Dr. Delford Field. 2.  Thoracic aortic aneurysm repair 1/10 also with bioprosthetic aortic valve replacement [facility]).  In 10/10, she had a descending thoracic aorta aneurysm and abdominal aortic aneurysm repair [facility]).  Vascular surgeon is Dr. Ty Hilts.  3.  Pulmonary embolism 2010.  She is no longer taking coumadin.  4.  SVT: Short runs seen on prior holter. 5.  Hypothyroidism 6.  R eye blind 7.  Hyperlipidemia 8.  Bioprosthetic aortic valve: Echo (8/11) with EF  55-60%, mild-mod MR, mild-mod TR, poorly visualized bioprosthetic aortic valve but no significant regurgitation and gradient not significantly elevated.  9.  CKD 10.  HTN 11.  Carotid dopplers (8/11): No significant stenosis  Family History: Brain cancer--MGM No premature CAD  Social History: Quit smoking in 2006. 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   ROS: All systems reviewed and negative except as per HPI.   Current Outpatient Prescriptions  Medication Sig Dispense Refill  . amLODipine (NORVASC) 5 MG tablet Take 5 mg by mouth daily.        Marland Kitchen atorvastatin (LIPITOR) 20 MG tablet Take 1 tablet (20 mg total) by mouth daily.  30 tablet  11  . Calcium Carbonate-Vitamin D (CALCIUM-VITAMIN D3) 600-200 MG-UNIT TABS Take by mouth daily.        . ergocalciferol (VITAMIN D2) 50000 UNITS capsule Take 50,000 Units by mouth every 30 (thirty) days.        Marland Kitchen losartan (COZAAR) 100 MG tablet Take 100 mg by mouth daily.        . metoprolol (LOPRESSOR) 50 MG tablet Take 50 mg by mouth 2 (two) times daily.        . mometasone-formoterol (DULERA) 100-5 MCG/ACT AERO Inhale 2 puffs into the lungs 2 (two) times daily. Rinse mouth after each use  1 Inhaler  11  . Multiple Vitamin (MULTIVITAMIN) tablet Take 1 tablet by mouth daily.        Marland Kitchen  omeprazole (PRILOSEC) 20 MG capsule Take 20 mg by mouth daily.        Marland Kitchen SYNTHROID 125 MCG tablet Take 1 tablet by mouth Daily.      Marland Kitchen DISCONTD: aspirin 81 MG tablet One tablet on M, W, F, Sat      . aspirin EC 81 MG tablet Take 1 tablet (81 mg total) by mouth daily.        BP 122/64  Pulse 64  Ht 5\' 1"  (1.549 m)  Wt 58.06 kg (128 lb)  BMI 24.19 kg/m2 General:  Well developed, well nourished, in no acute distress. Neck:  Neck supple, no JVD. No masses, thyromegaly or abnormal cervical nodes. Lungs:  Slightly decreased breath sounds bilaterally, no crackles.  Heart:  Non-displaced PMI, chest non-tender; regular rate and rhythm, S1, S2 without rubs or  gallops. 2/6 early systolic murmur.  Carotid upstroke normal, left carotid bruit.  Pedals normal pulses. No edema, no varicosities. Abdomen:  Bowel sounds positive; abdomen soft and non-tender without masses, organomegaly, or hernias noted. No hepatosplenomegaly. Extremities:  No clubbing or cyanosis. Neurologic:  Alert and oriented x 3. Psych:  Normal affect.

## 2011-05-04 NOTE — Patient Instructions (Signed)
Take aspirin 81mg  daily.  Your physician recommends that you have lab work today-BMET 424.1  401.9   Your physician wants you to follow-up in: 1 year with Dr Shirlee Latch. (January 2014).You will receive a reminder letter in the mail two months in advance. If you don't receive a letter, please call our office to schedule the follow-up appointment. All

## 2011-05-04 NOTE — Assessment & Plan Note (Signed)
Patient is status post bioprosthetic AVR and extensive replacement of the thoracic and abdominal aorta.  Echo did not show significant abnormality of the valve in 8/11.  She should take ASA 81 mg daily given the bioprosthetic aortic valve.  She really does not notice decreased bruising taking the ASA every other day versus every day.  She has not had GI discomfort or GI bleeding on aspirin.  She says that she will take ASA 81 mg every day from now on.

## 2011-05-04 NOTE — Assessment & Plan Note (Signed)
Follows with Dr. Delford Field.  Stable dyspnea.

## 2011-05-04 NOTE — Assessment & Plan Note (Signed)
Creatinine 1.5 when last checked.  Will repeat BMET.  She should avoid NSAIDs as much as possible.  She is due to get CTA this summer to follow her aortic repair.  She will need to stay well-hydrated around this time as she is at increased risk for contrast nephropathy.

## 2011-05-04 NOTE — Assessment & Plan Note (Signed)
BP has been well controlled.  

## 2011-05-04 NOTE — Assessment & Plan Note (Signed)
Status post extensive surgery.  Followup CTs at [facility] are being done regularly, next will be this summer.  Need to ensure good BP control.

## 2011-05-05 DIAGNOSIS — N289 Disorder of kidney and ureter, unspecified: Secondary | ICD-10-CM | POA: Diagnosis not present

## 2011-05-05 DIAGNOSIS — M48061 Spinal stenosis, lumbar region without neurogenic claudication: Secondary | ICD-10-CM | POA: Diagnosis not present

## 2011-05-06 ENCOUNTER — Other Ambulatory Visit: Payer: Self-pay | Admitting: Internal Medicine

## 2011-05-06 DIAGNOSIS — R7989 Other specified abnormal findings of blood chemistry: Secondary | ICD-10-CM

## 2011-05-07 ENCOUNTER — Other Ambulatory Visit: Payer: Self-pay | Admitting: Cardiology

## 2011-05-07 MED ORDER — ATORVASTATIN CALCIUM 20 MG PO TABS: 20.0000 mg | ORAL_TABLET | Freq: Every day | ORAL | Status: DC

## 2011-05-10 ENCOUNTER — Other Ambulatory Visit: Payer: Self-pay | Admitting: Neurosurgery

## 2011-05-10 ENCOUNTER — Ambulatory Visit
Admission: RE | Admit: 2011-05-10 | Discharge: 2011-05-10 | Disposition: A | Discharge status: Home / Self Care | Payer: Medicare Other | Source: Ambulatory Visit | Attending: Neurosurgery | Admitting: Neurosurgery

## 2011-05-10 DIAGNOSIS — M79609 Pain in unspecified limb: Secondary | ICD-10-CM | POA: Diagnosis not present

## 2011-05-10 DIAGNOSIS — M545 Low back pain: Secondary | ICD-10-CM

## 2011-05-10 DIAGNOSIS — M47817 Spondylosis without myelopathy or radiculopathy, lumbosacral region: Secondary | ICD-10-CM | POA: Diagnosis not present

## 2011-05-10 DIAGNOSIS — M5126 Other intervertebral disc displacement, lumbar region: Secondary | ICD-10-CM | POA: Diagnosis not present

## 2011-05-10 DIAGNOSIS — IMO0002 Reserved for concepts with insufficient information to code with codable children: Secondary | ICD-10-CM | POA: Diagnosis not present

## 2011-05-10 DIAGNOSIS — M4310 Spondylolisthesis, site unspecified: Secondary | ICD-10-CM | POA: Diagnosis not present

## 2011-05-10 DIAGNOSIS — M5137 Other intervertebral disc degeneration, lumbosacral region: Secondary | ICD-10-CM | POA: Diagnosis not present

## 2011-05-10 DIAGNOSIS — M48061 Spinal stenosis, lumbar region without neurogenic claudication: Secondary | ICD-10-CM | POA: Diagnosis not present

## 2011-05-10 DIAGNOSIS — M519 Unspecified thoracic, thoracolumbar and lumbosacral intervertebral disc disorder: Secondary | ICD-10-CM | POA: Diagnosis not present

## 2011-05-11 ENCOUNTER — Ambulatory Visit
Admission: RE | Admit: 2011-05-11 | Discharge: 2011-05-11 | Disposition: A | Discharge status: Home / Self Care | Payer: Medicare Other | Source: Ambulatory Visit | Attending: Internal Medicine | Admitting: Internal Medicine

## 2011-05-11 DIAGNOSIS — M48061 Spinal stenosis, lumbar region without neurogenic claudication: Secondary | ICD-10-CM | POA: Diagnosis not present

## 2011-05-11 DIAGNOSIS — R7989 Other specified abnormal findings of blood chemistry: Secondary | ICD-10-CM | POA: Diagnosis not present

## 2011-05-13 ENCOUNTER — Encounter (HOSPITAL_COMMUNITY): Payer: Self-pay | Admitting: Pharmacy Technician

## 2011-05-18 NOTE — Pre-Procedure Instructions (Addendum)
20 Sarah Johnson  05/18/2011   Your procedure is scheduled on:  05/31/11  Report to [facility] at 830 AM.  Call this number if you have problems the morning of surgery: [phone]   Remember:   Do not eat food:After Midnight.  May have clear liquids: up to 4 Hours before arrival.  Clear liquids include soda, tea, black coffee, apple or grape juice, broth.  Take these medicines the morning of surgery with A SIP OF WATER:, metoprolol, prilosec, synthroid               STOP ASA,MULTI VITAMIN  05/23/11   Do not wear jewelry, make-up or nail polish.  Do not wear lotions, powders, or perfumes. You may wear deodorant.  Do not shave 48 hours prior to surgery.  Do not bring valuables to the hospital.  Contacts, dentures or bridgework may not be worn into surgery.  Leave suitcase in the car. After surgery it may be brought to your room.  For patients admitted to the hospital, checkout time is 11:00 AM the day of discharge.   Patients discharged the day of surgery will not be allowed to drive home.  Name and phone number of your driver FRED [phone]:   Special Instructions: CHG Shower Use Special Wash: 1/2 bottle night before surgery and 1/2 bottle morning of surgery.   Please read over the following fact sheets that you were given: Pain Booklet, Coughing and Deep Breathing, MRSA Information and Surgical Site Infection Prevention

## 2011-05-19 ENCOUNTER — Encounter (HOSPITAL_COMMUNITY)
Admission: RE | Admit: 2011-05-19 | Discharge: 2011-05-19 | Disposition: A | Discharge status: Home / Self Care | Payer: Medicare Other | Source: Ambulatory Visit | Attending: Anesthesiology | Admitting: Anesthesiology

## 2011-05-19 ENCOUNTER — Encounter (HOSPITAL_COMMUNITY): Payer: Self-pay

## 2011-05-19 ENCOUNTER — Encounter (HOSPITAL_COMMUNITY)
Admission: RE | Admit: 2011-05-19 | Discharge: 2011-05-19 | Disposition: A | Discharge status: Home / Self Care | Payer: Medicare Other | Source: Ambulatory Visit | Attending: Neurosurgery | Admitting: Neurosurgery

## 2011-05-19 DIAGNOSIS — R918 Other nonspecific abnormal finding of lung field: Secondary | ICD-10-CM | POA: Diagnosis not present

## 2011-05-19 DIAGNOSIS — I10 Essential (primary) hypertension: Secondary | ICD-10-CM | POA: Diagnosis not present

## 2011-05-19 DIAGNOSIS — J438 Other emphysema: Secondary | ICD-10-CM | POA: Diagnosis not present

## 2011-05-19 HISTORY — DX: Unspecified osteoarthritis, unspecified site: M19.90

## 2011-05-19 HISTORY — DX: Gastro-esophageal reflux disease without esophagitis: K21.9

## 2011-05-19 LAB — BASIC METABOLIC PANEL
CO2: 25 mEq/L (ref 19–32)
Chloride: 104 mEq/L (ref 96–112)
Potassium: 4.6 mEq/L (ref 3.5–5.1)
Sodium: 139 mEq/L (ref 135–145)

## 2011-05-19 LAB — CBC
HCT: 35.7 % — ABNORMAL LOW (ref 36.0–46.0)
MCV: 89.7 fL (ref 78.0–100.0)
RBC: 3.98 MIL/uL (ref 3.87–5.11)
WBC: 11.9 10*3/uL — ABNORMAL HIGH (ref 4.0–10.5)

## 2011-05-19 LAB — SURGICAL PCR SCREEN: Staphylococcus aureus: NEGATIVE

## 2011-05-19 NOTE — Progress Notes (Signed)
ekg 7/12 echo 8/11 in epic note from dr Shirlee Latch 1.13 and pulmonary note from dr Delford Field in epic

## 2011-05-26 ENCOUNTER — Telehealth: Payer: Self-pay | Admitting: Cardiology

## 2011-05-26 ENCOUNTER — Telehealth: Payer: Self-pay | Admitting: Critical Care Medicine

## 2011-05-26 DIAGNOSIS — I10 Essential (primary) hypertension: Secondary | ICD-10-CM | POA: Diagnosis not present

## 2011-05-26 DIAGNOSIS — N289 Disorder of kidney and ureter, unspecified: Secondary | ICD-10-CM | POA: Diagnosis not present

## 2011-05-26 NOTE — Telephone Encounter (Signed)
noted 

## 2011-05-26 NOTE — Telephone Encounter (Signed)
New msg FYI Pt wanted you to know she is having back surgery on Monday.

## 2011-05-26 NOTE — Telephone Encounter (Signed)
Will forward to Dr. Shirlee Latch and Katina Dung, RN.

## 2011-05-28 NOTE — Telephone Encounter (Signed)
Reviewed with Dr Shirlee Latch. Stable for back surgery. Continue metoprolol peri-op. I discussed with pt.

## 2011-05-31 ENCOUNTER — Encounter (HOSPITAL_COMMUNITY)
Admission: RE | Disposition: A | Discharge status: Home / Self Care | Payer: Self-pay | Source: Ambulatory Visit | Attending: Neurosurgery

## 2011-05-31 ENCOUNTER — Encounter (HOSPITAL_COMMUNITY): Payer: Self-pay | Admitting: *Deleted

## 2011-05-31 ENCOUNTER — Encounter (HOSPITAL_COMMUNITY): Payer: Self-pay | Admitting: Anesthesiology

## 2011-05-31 ENCOUNTER — Ambulatory Visit (HOSPITAL_COMMUNITY): Payer: Medicare Other | Admitting: Anesthesiology

## 2011-05-31 ENCOUNTER — Ambulatory Visit (HOSPITAL_COMMUNITY): Payer: Medicare Other

## 2011-05-31 ENCOUNTER — Inpatient Hospital Stay (HOSPITAL_COMMUNITY)
Admission: RE | Admit: 2011-05-31 | Discharge: 2011-06-01 | DRG: 491 | Disposition: A | Discharge status: Home / Self Care | Payer: Medicare Other | Source: Ambulatory Visit | Attending: Neurosurgery | Admitting: Neurosurgery

## 2011-05-31 DIAGNOSIS — Z79899 Other long term (current) drug therapy: Secondary | ICD-10-CM

## 2011-05-31 DIAGNOSIS — E039 Hypothyroidism, unspecified: Secondary | ICD-10-CM | POA: Diagnosis not present

## 2011-05-31 DIAGNOSIS — Z01812 Encounter for preprocedural laboratory examination: Secondary | ICD-10-CM | POA: Diagnosis not present

## 2011-05-31 DIAGNOSIS — N189 Chronic kidney disease, unspecified: Secondary | ICD-10-CM | POA: Diagnosis not present

## 2011-05-31 DIAGNOSIS — M519 Unspecified thoracic, thoracolumbar and lumbosacral intervertebral disc disorder: Secondary | ICD-10-CM | POA: Diagnosis not present

## 2011-05-31 DIAGNOSIS — J449 Chronic obstructive pulmonary disease, unspecified: Secondary | ICD-10-CM | POA: Diagnosis not present

## 2011-05-31 DIAGNOSIS — Z01818 Encounter for other preprocedural examination: Secondary | ICD-10-CM | POA: Diagnosis not present

## 2011-05-31 DIAGNOSIS — Z86711 Personal history of pulmonary embolism: Secondary | ICD-10-CM | POA: Diagnosis not present

## 2011-05-31 DIAGNOSIS — M545 Low back pain: Secondary | ICD-10-CM | POA: Diagnosis not present

## 2011-05-31 DIAGNOSIS — I10 Essential (primary) hypertension: Secondary | ICD-10-CM

## 2011-05-31 DIAGNOSIS — K219 Gastro-esophageal reflux disease without esophagitis: Secondary | ICD-10-CM | POA: Diagnosis not present

## 2011-05-31 DIAGNOSIS — M79609 Pain in unspecified limb: Secondary | ICD-10-CM | POA: Diagnosis not present

## 2011-05-31 DIAGNOSIS — M48061 Spinal stenosis, lumbar region without neurogenic claudication: Principal | ICD-10-CM | POA: Diagnosis present

## 2011-05-31 DIAGNOSIS — M48062 Spinal stenosis, lumbar region with neurogenic claudication: Secondary | ICD-10-CM | POA: Diagnosis not present

## 2011-05-31 DIAGNOSIS — M129 Arthropathy, unspecified: Secondary | ICD-10-CM | POA: Diagnosis not present

## 2011-05-31 DIAGNOSIS — H5440 Blindness, one eye, unspecified eye: Secondary | ICD-10-CM | POA: Diagnosis present

## 2011-05-31 DIAGNOSIS — IMO0002 Reserved for concepts with insufficient information to code with codable children: Secondary | ICD-10-CM | POA: Diagnosis not present

## 2011-05-31 DIAGNOSIS — Z7982 Long term (current) use of aspirin: Secondary | ICD-10-CM | POA: Diagnosis not present

## 2011-05-31 DIAGNOSIS — E785 Hyperlipidemia, unspecified: Secondary | ICD-10-CM | POA: Diagnosis present

## 2011-05-31 DIAGNOSIS — I129 Hypertensive chronic kidney disease with stage 1 through stage 4 chronic kidney disease, or unspecified chronic kidney disease: Secondary | ICD-10-CM | POA: Diagnosis not present

## 2011-05-31 DIAGNOSIS — Z954 Presence of other heart-valve replacement: Secondary | ICD-10-CM

## 2011-05-31 DIAGNOSIS — J4489 Other specified chronic obstructive pulmonary disease: Secondary | ICD-10-CM | POA: Diagnosis present

## 2011-05-31 DIAGNOSIS — I359 Nonrheumatic aortic valve disorder, unspecified: Secondary | ICD-10-CM

## 2011-05-31 HISTORY — PX: LUMBAR LAMINECTOMY/DECOMPRESSION MICRODISCECTOMY: SHX5026

## 2011-05-31 SURGERY — LUMBAR LAMINECTOMY/DECOMPRESSION MICRODISCECTOMY
Anesthesia: General | Site: Back | Laterality: Bilateral | Wound class: Clean

## 2011-05-31 MED ORDER — ACETAMINOPHEN 10 MG/ML IV SOLN
INTRAVENOUS | Status: AC
  Filled 2011-05-31: qty 100

## 2011-05-31 MED ORDER — HEMOSTATIC AGENTS (NO CHARGE) OPTIME
TOPICAL | Status: DC | PRN
  Administered 2011-05-31: 1 via TOPICAL

## 2011-05-31 MED ORDER — DEXTROSE 5 % IV SOLN
INTRAVENOUS | Status: DC | PRN
  Administered 2011-05-31 (×2): via INTRAVENOUS

## 2011-05-31 MED ORDER — LOSARTAN POTASSIUM 50 MG PO TABS
100.0000 mg | ORAL_TABLET | Freq: Every day | ORAL | Status: DC
  Administered 2011-05-31 – 2011-06-01 (×2): 100 mg via ORAL
  Filled 2011-05-31 (×2): qty 2

## 2011-05-31 MED ORDER — ADULT MULTIVITAMIN W/MINERALS CH
1.0000 | ORAL_TABLET | Freq: Every day | ORAL | Status: DC
  Administered 2011-05-31 – 2011-06-01 (×2): 1 via ORAL
  Filled 2011-05-31 (×2): qty 1

## 2011-05-31 MED ORDER — MIDAZOLAM HCL 5 MG/5ML IJ SOLN
INTRAMUSCULAR | Status: DC | PRN
  Administered 2011-05-31: 1 mg via INTRAVENOUS

## 2011-05-31 MED ORDER — ASPIRIN EC 81 MG PO TBEC
81.0000 mg | DELAYED_RELEASE_TABLET | Freq: Every day | ORAL | Status: DC
  Administered 2011-05-31 – 2011-06-01 (×2): 81 mg via ORAL
  Filled 2011-05-31 (×2): qty 1

## 2011-05-31 MED ORDER — METOPROLOL TARTRATE 50 MG PO TABS
50.0000 mg | ORAL_TABLET | Freq: Two times a day (BID) | ORAL | Status: DC
  Administered 2011-06-01: 50 mg via ORAL
  Filled 2011-05-31 (×3): qty 1

## 2011-05-31 MED ORDER — MEPERIDINE HCL 25 MG/ML IJ SOLN: 6.2500 mg | INTRAMUSCULAR | Status: DC | PRN

## 2011-05-31 MED ORDER — DOCUSATE SODIUM 100 MG PO CAPS
100.0000 mg | ORAL_CAPSULE | Freq: Two times a day (BID) | ORAL | Status: DC
  Administered 2011-05-31 – 2011-06-01 (×2): 100 mg via ORAL
  Filled 2011-05-31 (×2): qty 1

## 2011-05-31 MED ORDER — THROMBIN 5000 UNITS EX SOLR
OROMUCOSAL | Status: DC | PRN
  Administered 2011-05-31: 09:00:00 via TOPICAL

## 2011-05-31 MED ORDER — GLYCOPYRROLATE 0.2 MG/ML IJ SOLN
INTRAMUSCULAR | Status: DC | PRN
  Administered 2011-05-31: .4 mg via INTRAVENOUS

## 2011-05-31 MED ORDER — PHENOL 1.4 % MT LIQD: 1.0000 | OROMUCOSAL | Status: DC | PRN

## 2011-05-31 MED ORDER — HYDROCODONE-ACETAMINOPHEN 5-325 MG PO TABS
1.0000 | ORAL_TABLET | ORAL | Status: DC | PRN
  Administered 2011-05-31: 1 via ORAL
  Filled 2011-05-31: qty 1

## 2011-05-31 MED ORDER — MOMETASONE FURO-FORMOTEROL FUM 100-5 MCG/ACT IN AERO: 2.0000 | INHALATION_SPRAY | Freq: Two times a day (BID) | RESPIRATORY_TRACT | Status: DC

## 2011-05-31 MED ORDER — LACTATED RINGERS IV SOLN: INTRAVENOUS | Status: DC

## 2011-05-31 MED ORDER — THROMBIN 5000 UNITS EX SOLR
CUTANEOUS | Status: DC | PRN
  Administered 2011-05-31 (×2): 5000 [IU] via TOPICAL

## 2011-05-31 MED ORDER — PHENYLEPHRINE HCL 10 MG/ML IJ SOLN
INTRAMUSCULAR | Status: DC | PRN
  Administered 2011-05-31 (×3): 80 ug via INTRAVENOUS

## 2011-05-31 MED ORDER — ONDANSETRON HCL 4 MG/2ML IJ SOLN
INTRAMUSCULAR | Status: DC | PRN
  Administered 2011-05-31: 4 mg via INTRAVENOUS

## 2011-05-31 MED ORDER — ACETAMINOPHEN 650 MG RE SUPP: 650.0000 mg | RECTAL | Status: DC | PRN

## 2011-05-31 MED ORDER — AMLODIPINE BESYLATE 5 MG PO TABS
5.0000 mg | ORAL_TABLET | Freq: Every day | ORAL | Status: DC
  Administered 2011-05-31 – 2011-06-01 (×2): 5 mg via ORAL
  Filled 2011-05-31 (×2): qty 1

## 2011-05-31 MED ORDER — LIDOCAINE HCL 4 % MT SOLN
OROMUCOSAL | Status: DC | PRN
  Administered 2011-05-31: 4 mL via TOPICAL

## 2011-05-31 MED ORDER — MENTHOL 3 MG MT LOZG: 1.0000 | LOZENGE | OROMUCOSAL | Status: DC | PRN

## 2011-05-31 MED ORDER — SODIUM CHLORIDE 0.9 % IR SOLN
Status: DC | PRN
  Administered 2011-05-31: 07:00:00

## 2011-05-31 MED ORDER — SODIUM CHLORIDE 0.9 % IJ SOLN
3.0000 mL | Freq: Two times a day (BID) | INTRAMUSCULAR | Status: DC
  Administered 2011-05-31 (×2): 3 mL via INTRAVENOUS

## 2011-05-31 MED ORDER — FENTANYL CITRATE 0.05 MG/ML IJ SOLN
25.0000 ug | INTRAMUSCULAR | Status: DC | PRN
  Administered 2011-05-31: 25 ug via INTRAVENOUS

## 2011-05-31 MED ORDER — HYDROMORPHONE HCL PF 1 MG/ML IJ SOLN: 0.5000 mg | INTRAMUSCULAR | Status: DC | PRN

## 2011-05-31 MED ORDER — 0.9 % SODIUM CHLORIDE (POUR BTL) OPTIME
TOPICAL | Status: DC | PRN
  Administered 2011-05-31: 1000 mL

## 2011-05-31 MED ORDER — SIMVASTATIN 20 MG PO TABS
20.0000 mg | ORAL_TABLET | Freq: Every day | ORAL | Status: DC
  Administered 2011-05-31 – 2011-06-01 (×2): 20 mg via ORAL
  Filled 2011-05-31 (×2): qty 1

## 2011-05-31 MED ORDER — ONDANSETRON HCL 4 MG/2ML IJ SOLN: 4.0000 mg | INTRAMUSCULAR | Status: DC | PRN

## 2011-05-31 MED ORDER — PANTOPRAZOLE SODIUM 40 MG PO TBEC
40.0000 mg | DELAYED_RELEASE_TABLET | Freq: Every day | ORAL | Status: DC
  Administered 2011-05-31: 40 mg via ORAL
  Filled 2011-05-31: qty 1

## 2011-05-31 MED ORDER — ACETAMINOPHEN 325 MG PO TABS: 650.0000 mg | ORAL_TABLET | ORAL | Status: DC | PRN

## 2011-05-31 MED ORDER — PROPOFOL 10 MG/ML IV EMUL
INTRAVENOUS | Status: DC | PRN
  Administered 2011-05-31: 150 mg via INTRAVENOUS

## 2011-05-31 MED ORDER — ROCURONIUM BROMIDE 100 MG/10ML IV SOLN
INTRAVENOUS | Status: DC | PRN
Start: 2011-05-31 — End: 2011-05-31
  Administered 2011-05-31: 50 mg via INTRAVENOUS

## 2011-05-31 MED ORDER — SODIUM CHLORIDE 0.9 % IV SOLN
500.0000 mg | INTRAVENOUS | Status: DC | PRN
  Administered 2011-05-31: 500 mg via INTRAVENOUS

## 2011-05-31 MED ORDER — DEXAMETHASONE SODIUM PHOSPHATE 4 MG/ML IJ SOLN
INTRAMUSCULAR | Status: DC | PRN
  Administered 2011-05-31: 8 mg via INTRAVENOUS

## 2011-05-31 MED ORDER — CYCLOBENZAPRINE HCL 10 MG PO TABS
10.0000 mg | ORAL_TABLET | Freq: Three times a day (TID) | ORAL | Status: DC | PRN
  Administered 2011-05-31: 10 mg via ORAL
  Filled 2011-05-31: qty 1

## 2011-05-31 MED ORDER — CEFAZOLIN SODIUM 1-5 GM-% IV SOLN: 1.0000 g | Freq: Three times a day (TID) | INTRAVENOUS | Status: DC

## 2011-05-31 MED ORDER — FENTANYL CITRATE 0.05 MG/ML IJ SOLN
INTRAMUSCULAR | Status: DC | PRN
  Administered 2011-05-31: 125 ug via INTRAVENOUS

## 2011-05-31 MED ORDER — PANTOPRAZOLE SODIUM 40 MG IV SOLR: 40.0000 mg | Freq: Every day | INTRAVENOUS | Status: DC

## 2011-05-31 MED ORDER — PROMETHAZINE HCL 25 MG/ML IJ SOLN: 6.2500 mg | INTRAMUSCULAR | Status: DC | PRN

## 2011-05-31 MED ORDER — LIDOCAINE-EPINEPHRINE 1 %-1:100000 IJ SOLN
INTRAMUSCULAR | Status: DC | PRN
  Administered 2011-05-31: 10 mL

## 2011-05-31 MED ORDER — LEVOTHYROXINE SODIUM 125 MCG PO TABS
125.0000 ug | ORAL_TABLET | Freq: Every day | ORAL | Status: DC
  Administered 2011-06-01: 125 ug via ORAL
  Filled 2011-05-31 (×2): qty 1

## 2011-05-31 MED ORDER — ACETAMINOPHEN 10 MG/ML IV SOLN
INTRAVENOUS | Status: DC | PRN
  Administered 2011-05-31: 1000 mg via INTRAVENOUS

## 2011-05-31 MED ORDER — VANCOMYCIN HCL IN DEXTROSE 1-5 GM/200ML-% IV SOLN
INTRAVENOUS | Status: AC
  Filled 2011-05-31: qty 200

## 2011-05-31 MED ORDER — VANCOMYCIN HCL IN DEXTROSE 1-5 GM/200ML-% IV SOLN
1000.0000 mg | Freq: Once | INTRAVENOUS | Status: AC
  Administered 2011-05-31: 1000 mg via INTRAVENOUS
  Filled 2011-05-31: qty 200

## 2011-05-31 MED ORDER — BUPIVACAINE HCL (PF) 0.25 % IJ SOLN
INTRAMUSCULAR | Status: DC | PRN
  Administered 2011-05-31: 10 mL

## 2011-05-31 MED ORDER — NEOSTIGMINE METHYLSULFATE 1 MG/ML IJ SOLN
INTRAMUSCULAR | Status: DC | PRN
  Administered 2011-05-31: 3 mg via INTRAVENOUS

## 2011-05-31 MED ORDER — LACTATED RINGERS IV SOLN
INTRAVENOUS | Status: DC | PRN
  Administered 2011-05-31 (×2): via INTRAVENOUS

## 2011-05-31 SURGICAL SUPPLY — 56 items
BAG DECANTER FOR FLEXI CONT (MISCELLANEOUS) ×2 IMPLANT
BENZOIN TINCTURE PRP APPL 2/3 (GAUZE/BANDAGES/DRESSINGS) ×2 IMPLANT
BLADE SURG 11 STRL SS (BLADE) ×2 IMPLANT
BLADE SURG ROTATE 9660 (MISCELLANEOUS) IMPLANT
BRUSH SCRUB EZ PLAIN DRY (MISCELLANEOUS) ×2 IMPLANT
BUR MATCHSTICK NEURO 3.0 LAGG (BURR) ×2 IMPLANT
BUR PRECISION FLUTE 6.0 (BURR) ×2 IMPLANT
CANISTER SUCTION 2500CC (MISCELLANEOUS) ×2 IMPLANT
CLOTH BEACON ORANGE TIMEOUT ST (SAFETY) ×2 IMPLANT
CONT SPEC 4OZ CLIKSEAL STRL BL (MISCELLANEOUS) ×2 IMPLANT
DECANTER SPIKE VIAL GLASS SM (MISCELLANEOUS) ×2 IMPLANT
DERMABOND ADVANCED (GAUZE/BANDAGES/DRESSINGS) ×1
DERMABOND ADVANCED .7 DNX12 (GAUZE/BANDAGES/DRESSINGS) ×1 IMPLANT
DRAPE LAPAROTOMY 100X72X124 (DRAPES) ×2 IMPLANT
DRAPE MICROSCOPE ZEISS OPMI (DRAPES) ×2 IMPLANT
DRAPE POUCH INSTRU U-SHP 10X18 (DRAPES) ×2 IMPLANT
DRAPE PROXIMA HALF (DRAPES) IMPLANT
DRAPE SURG 17X23 STRL (DRAPES) ×2 IMPLANT
DRSG OPSITE 4X5.5 SM (GAUZE/BANDAGES/DRESSINGS) ×2 IMPLANT
DRSG OPSITE 6X11 MED (GAUZE/BANDAGES/DRESSINGS) ×2 IMPLANT
ELECT REM PT RETURN 9FT ADLT (ELECTROSURGICAL) ×2
ELECTRODE REM PT RTRN 9FT ADLT (ELECTROSURGICAL) ×1 IMPLANT
GAUZE SPONGE 4X4 12PLY STRL LF (GAUZE/BANDAGES/DRESSINGS) ×2 IMPLANT
GAUZE SPONGE 4X4 16PLY XRAY LF (GAUZE/BANDAGES/DRESSINGS) IMPLANT
GLOVE BIO SURGEON STRL SZ8 (GLOVE) ×2 IMPLANT
GLOVE BIOGEL PI IND STRL 6.5 (GLOVE) ×1 IMPLANT
GLOVE BIOGEL PI INDICATOR 6.5 (GLOVE) ×1
GLOVE ECLIPSE 7.5 STRL STRAW (GLOVE) IMPLANT
GLOVE EXAM NITRILE LRG STRL (GLOVE) IMPLANT
GLOVE EXAM NITRILE MD LF STRL (GLOVE) ×2 IMPLANT
GLOVE EXAM NITRILE XL STR (GLOVE) IMPLANT
GLOVE EXAM NITRILE XS STR PU (GLOVE) IMPLANT
GLOVE INDICATOR 7.0 STRL GRN (GLOVE) ×2 IMPLANT
GLOVE INDICATOR 8.5 STRL (GLOVE) ×2 IMPLANT
GOWN BRE IMP SLV AUR LG STRL (GOWN DISPOSABLE) ×2 IMPLANT
GOWN BRE IMP SLV AUR XL STRL (GOWN DISPOSABLE) ×4 IMPLANT
GOWN STRL REIN 2XL LVL4 (GOWN DISPOSABLE) IMPLANT
HEMOSTAT POWDER KIT SURGIFOAM (HEMOSTASIS) ×2 IMPLANT
KIT BASIN OR (CUSTOM PROCEDURE TRAY) ×2 IMPLANT
KIT ROOM TURNOVER OR (KITS) ×2 IMPLANT
NEEDLE HYPO 22GX1.5 SAFETY (NEEDLE) ×2 IMPLANT
NEEDLE SPNL 22GX3.5 QUINCKE BK (NEEDLE) ×2 IMPLANT
NS IRRIG 1000ML POUR BTL (IV SOLUTION) ×2 IMPLANT
PACK LAMINECTOMY NEURO (CUSTOM PROCEDURE TRAY) ×2 IMPLANT
RUBBERBAND STERILE (MISCELLANEOUS) ×4 IMPLANT
SPONGE GAUZE 4X4 12PLY (GAUZE/BANDAGES/DRESSINGS) ×2 IMPLANT
SPONGE SURGIFOAM ABS GEL SZ50 (HEMOSTASIS) ×2 IMPLANT
STRIP CLOSURE SKIN 1/2X4 (GAUZE/BANDAGES/DRESSINGS) ×2 IMPLANT
SUT VIC AB 0 CT1 18XCR BRD8 (SUTURE) ×1 IMPLANT
SUT VIC AB 0 CT1 8-18 (SUTURE) ×1
SUT VIC AB 2-0 CT1 18 (SUTURE) ×2 IMPLANT
SUT VICRYL 4-0 PS2 18IN ABS (SUTURE) ×2 IMPLANT
SYR 20ML ECCENTRIC (SYRINGE) ×2 IMPLANT
TOWEL OR 17X24 6PK STRL BLUE (TOWEL DISPOSABLE) ×2 IMPLANT
TOWEL OR 17X26 10 PK STRL BLUE (TOWEL DISPOSABLE) ×2 IMPLANT
WATER STERILE IRR 1000ML POUR (IV SOLUTION) ×2 IMPLANT

## 2011-05-31 NOTE — Anesthesia Preprocedure Evaluation (Addendum)
Anesthesia Evaluation  Patient identified by MRN, date of birth, ID band Patient awake    Reviewed: Allergy & Precautions, H&P , NPO status , Patient's Chart, lab work & pertinent test results, reviewed documented beta blocker date and time   Airway Mallampati: II      Dental   Pulmonary COPD CXR findings noted. Patient informed and Dr. Wynetta Emery informed. Lung CTA. Pt is asymptomatic. clear to auscultation        Cardiovascular hypertension, Pt. on medications and Pt. on home beta blockers + dysrhythmias Supra Ventricular Tachycardia Regular Normal    Neuro/Psych Bilateral back & Leg pain Negative Neurological ROS     GI/Hepatic Neg liver ROS, GERD-  Medicated and Controlled,  Endo/Other  Hypothyroidism   Renal/GU Renal InsufficiencyRenal disease     Musculoskeletal  (+) Arthritis -, Osteoarthritis,    Abdominal   Peds  Hematology   Anesthesia Other Findings Overbite, w/ some lateral & rear crowns present. Pt is Blind in Right Eye  Reproductive/Obstetrics                       Anesthesia Physical Anesthesia Plan  ASA: III  Anesthesia Plan: General   Post-op Pain Management:    Induction: Intravenous  Airway Management Planned: Oral ETT  Additional Equipment:   Intra-op Plan:   Post-operative Plan: Extubation in OR  Informed Consent: I have reviewed the patients History and Physical, chart, labs and discussed the procedure including the risks, benefits and alternatives for the proposed anesthesia with the patient or authorized representative who has indicated his/her understanding and acceptance.   Dental advisory given  Plan Discussed with: CRNA  Anesthesia Plan Comments:        Anesthesia Quick Evaluation

## 2011-05-31 NOTE — Op Note (Signed)
Preoperative diagnosis: Lumbar spinal stenosis L3-4 bilateral L3 and L4 radiculopathies and neurogenic claudication  Postoperative diagnosis: Same  Procedure: Bilateral decompressive lumbar laminectomy  L3-4 with foraminotomies of both the L3 and L4 nerve roots bilaterally  Surgeon: Jillyn Hidden Aritza Brunet  Anesthesia: General  EBL: Less than 250  History of present illness: Patient is a 78 year old female has had several months of progressively worsening back and bilateral leg pain with claudication or very short distances. Pain or legs is consistent with an L3 and L4 nerve root pattern and imaging findings showed her lumbar spinal stenosis L3-4 with a diffuse disc space at L5-S1 partially sacralized L5 vertebra. Patient failed all forms of conservative treatment and due to imaging findings progression of clinical exam she was recommended decompression procedure at that level. The risks and benefits of the operation as well as perioperative course, alternatives to surgery, and expectations of outcome and she understood and agreed to proceed forward.  Operative procedure: Patient brought into the or was induced under general anesthesia and positioned prone on the Wilson frame her back was prepped in routine sterile fashion progress to localize the appropriate levels after infiltration 10 cc lidocaine with epi a midline incision was made and Bovie light cautery was used to take down the subcutaneous tissues and subperiosteal 6 care lamina of L3 and L4 bilaterally interoperative X. identify the L3 lamina into the spinous process at L3 was removed in its entirety: Suppressant spinous process of L4 decompression was begun immediately visualizes marked degenerative facet complex with some hypertrophy of the ligament flavum and marked degeneration of what appeared to be bilateral synovial cyst with a cottage cheese appearance of the ligament joint complex. This was teased off the dura with a 4 Penfield and removed in  piecemeal fashion very carefully with a 2 mm Kerrison punch. Care was taken not to violate the joint complex is significantly so using a high-speed drill very minimal shaving of the medial facet complex was performed to allow under biting of the facet complex. The L3 and L4 nerve roots were identified there was marked stenosis underneath the L3 root causing hourglass compression thecal sac and this degenerative its facet complex as well as hypertrophy of the ligament the this again was removed in piecemeal fashion with pituitary sponge decompress the disc space level and both foramina were decompressed the after adequate exploration with a coronary dilator and hockey-stick both foramina confirming be widely patent at the decompression was completed no evidence of violation of the dura or CSF leak S1 the facet complexes appeared to be intact and not unstable. It was in a stasis was then maintained Gelfoam Surgifoam copious irrigation bipolar Bovie light cautery. It was in copious irrigated and the pneumatic drain was placed and the wounds closed in layers with after Vicryl the skin was) 4 subcuticular benzoin and Steri-Strips were applied patient recovered in stable condition. At the end of the case a little counts and sponge counts were correct.

## 2011-05-31 NOTE — Preoperative (Addendum)
Beta Blockers   Metoprolol taken 4:00 am today

## 2011-05-31 NOTE — H&P (Signed)
Sarah Johnson is an 78 y.o. female.   Chief Complaint: Back and bilateral leg pain and neurogenic claudication HPI: Patient is a 78 year old female whose had several months of progressively worsening back and bilateral leg pain she's only a blow walk short distances or legs to bother him the pain got progressively worse it affects both legs equally she denies any bowel bladder complaints. She has been to various exercise programs as well as epidural steroid injections with no significant relief. She is also failed all forms of conservative treatment with pain management anti-inflammatories and narcotics. Workup has revealed severe lumbar spinal stenosis and patient presents for decompression laminectomy.  Past Medical History  Diagnosis Date  . Emphysema     followed by Dr. Delford Field  . S/P thoracic aortic aneurysm repair 1/10    also with bioprosthetic aortic valve prelacement [facility]). in 10/10 she has a descending thoraic aorta anuerysm and abdominal reapir [facility]). Vascular urgeon is Dr. Ty Hilts.   . Pulmonary embolism 2010    She is no onger taking coumadin  . Hypothyroidism   . Blind right eye   . Hyperlipidemia   . S/P aortic valve replacement with bioprosthetic valve     ehco (8/11) with EF 55-60%, mild-mod MR, mild-mod TR, poorly visualized bioprosthetic aortic valve but no significant regurgitation and gradient not significanly elevated.    . CKD (chronic kidney disease)   . HTN (hypertension)   . History of Doppler echocardiogram     carotid dopplers. (8/11) no significant stenosis  . COPD (chronic obstructive pulmonary disease)   . SVT (supraventricular tachycardia)     HOLTER MONITOR AFTER SURGERY   . GERD (gastroesophageal reflux disease)   . Arthritis   . Cataract     RT    Past Surgical History  Procedure Date  . Thoracoabdominal aortic aneurysm repair 01/28/09    repair  . Anomalous pulmonary venous return repair   . Thoracic aortic aneurysm repair 1/10      VALVE ALSO DONE  . Back surgery 1970   . Eye surgery     AS BABY- RT EYE  POOR SIGHT  . Tonsillectomy     Family History  Problem Relation Age of Onset  . Brain cancer Maternal Grandmother   . Coronary artery disease      no premature CAD   Social History:  reports that she quit smoking about 7 years ago. Her smoking use included Cigarettes. She has a 20 pack-year smoking history. She has never used smokeless tobacco. She reports that she does not drink alcohol or use illicit drugs.  Allergies:  Allergies  Allergen Reactions  . Codeine   . Penicillins     No current facility-administered medications on file as of 05/31/2011.   Medications Prior to Admission  Medication Sig Dispense Refill  . amLODipine (NORVASC) 5 MG tablet Take 5 mg by mouth daily.        Marland Kitchen aspirin EC 81 MG tablet Take 1 tablet (81 mg total) by mouth daily.      Marland Kitchen atorvastatin (LIPITOR) 20 MG tablet Take 1 tablet (20 mg total) by mouth daily.  30 tablet  11  . Calcium Carbonate-Vitamin D (CALCIUM-VITAMIN D3) 600-200 MG-UNIT TABS Take by mouth daily.        . ergocalciferol (VITAMIN D2) 50000 UNITS capsule Take 50,000 Units by mouth every 30 (thirty) days.        Marland Kitchen losartan (COZAAR) 100 MG tablet Take 100 mg by mouth daily.        Marland Kitchen  metoprolol (LOPRESSOR) 50 MG tablet Take 50 mg by mouth 2 (two) times daily.        . mometasone-formoterol (DULERA) 100-5 MCG/ACT AERO Inhale 2 puffs into the lungs 2 (two) times daily. Rinse mouth after each use  1 Inhaler  11  . omeprazole (PRILOSEC) 20 MG capsule Take 20 mg by mouth daily.        Marland Kitchen SYNTHROID 125 MCG tablet Take 125 mcg by mouth Daily.         No results found for this or any previous visit (from the past 48 hour(s)). No results found.  Review of Systems  Constitutional: Negative.   HENT: Negative.   Eyes: Negative.   Respiratory: Negative.   Gastrointestinal: Negative.   Musculoskeletal: Positive for back pain and joint pain.  Skin: Negative.    Neurological: Positive for tingling.    Blood pressure 136/67, pulse 56, temperature 98.1 F (36.7 C), temperature source Oral, resp. rate 18, SpO2 96.00%. Physical Exam  Constitutional: She is oriented to person, place, and time. She appears well-developed and well-nourished.  HENT:  Head: Normocephalic.  Eyes: Pupils are equal, round, and reactive to light.  Neck: Normal range of motion.  Cardiovascular: Normal rate.   GI: Soft.  Neurological: She is alert and oriented to person, place, and time.       Patient is 5 out of 5 strength in her iliopsoas quads hamstrings gastrocs anterior tibialis and EHL. Sensation is grossly intact to light touch     Assessment/Plan 78 year old female presents with severe lumbar stenosis L3-4 she has transitional anatomy with a sacralized L5 vertebra and virtually no disc space left there so this is the second disc space above her sacrum. Patient's failed all forms of conservative treatment. I have extensively explained the risks and benefits of the operation, alternatives to surgery, perioperative course, and expectations of outcome and the patient stands and agrees to proceed forward.  [name] P 05/31/2011, 7:16 AM

## 2011-05-31 NOTE — Anesthesia Postprocedure Evaluation (Signed)
  Anesthesia Post-op Note  Patient: Sarah Johnson  Procedure(s) Performed:  LUMBAR LAMINECTOMY/DECOMPRESSION MICRODISCECTOMY - Bilateral Lumbar three-four decompressionj lumbar laminectomy   Patient Location: PACU  Anesthesia Type: General  Level of Consciousness: awake  Airway and Oxygen Therapy: Patient Spontanous Breathing  Post-op Pain: mild  Post-op Assessment: Post-op Vital signs reviewed  Post-op Vital Signs: stable  Complications: No apparent anesthesia complications

## 2011-05-31 NOTE — Transfer of Care (Signed)
Immediate Anesthesia Transfer of Care Note  Patient: Sarah Johnson  Procedure(s) Performed:  LUMBAR LAMINECTOMY/DECOMPRESSION MICRODISCECTOMY - Bilateral Lumbar three-four decompressionj lumbar laminectomy   Patient Location: PACU  Anesthesia Type: General  Level of Consciousness: awake, alert , oriented and patient cooperative  Airway & Oxygen Therapy: Patient Spontanous Breathing and Patient connected to nasal cannula oxygen  Post-op Assessment: Report given to PACU RN and Post -op Vital signs reviewed and stable  Post vital signs: Reviewed and stable  Complications: No apparent anesthesia complications

## 2011-06-01 DIAGNOSIS — N189 Chronic kidney disease, unspecified: Secondary | ICD-10-CM | POA: Diagnosis not present

## 2011-06-01 DIAGNOSIS — M48061 Spinal stenosis, lumbar region without neurogenic claudication: Secondary | ICD-10-CM | POA: Diagnosis not present

## 2011-06-01 DIAGNOSIS — K219 Gastro-esophageal reflux disease without esophagitis: Secondary | ICD-10-CM | POA: Diagnosis not present

## 2011-06-01 DIAGNOSIS — E039 Hypothyroidism, unspecified: Secondary | ICD-10-CM | POA: Diagnosis not present

## 2011-06-01 DIAGNOSIS — I129 Hypertensive chronic kidney disease with stage 1 through stage 4 chronic kidney disease, or unspecified chronic kidney disease: Secondary | ICD-10-CM | POA: Diagnosis not present

## 2011-06-01 DIAGNOSIS — M129 Arthropathy, unspecified: Secondary | ICD-10-CM | POA: Diagnosis not present

## 2011-06-01 NOTE — Progress Notes (Signed)
Subjective: Patient reports Patient was doing very well seated no leg pain hasn't taken any pain pills voiding well  Objective: Vital signs in last 24 hours: Temp:  [96.8 F (36 C)-98.3 F (36.8 C)] 98.3 F (36.8 C) (02/05 0755) Pulse Rate:  [55-72] 65  (02/05 0755) Resp:  [14-20] 18  (02/05 0755) BP: (100-138)/(57-73) 138/73 mmHg (02/05 0755) SpO2:  [93 %-97 %] 96 % (02/05 0755)  Intake/Output from previous day: 02/04 0701 - 02/05 0700 In: 1900 [P.O.:600; I.V.:1300] Out: 335 [Drains:135; Blood:200] Intake/Output this shift: Total I/O In: 360 [P.O.:360] Out: -   Strength is 5 out of 5 wound is clean and dry  Lab Results: No results found for this basename: WBC:2,HGB:2,HCT:2,PLT:2 in the last 72 hours BMET No results found for this basename: NA:2,K:2,CL:2,CO2:2,GLUCOSE:2,BUN:2,CREATININE:2,CALCIUM:2 in the last 72 hours  Studies/Results: Dg Lumbar Spine 2-3 Views  05/31/2011  *RADIOLOGY REPORT*  Clinical Data: Lumbar laminectomy L3-4  LUMBAR SPINE - 2-3 VIEW  Comparison: CT lumbar spine dated 05/10/2011  Findings: Initial radiograph demonstrates a surgical probe posterior to L3.  Second radiograph demonstrates surgical instruments at L3-4.  Stable alignment with grade 1 anterolisthesis of L4 on L5.  IMPRESSION: Lumbar levels as above.  Original Report Authenticated By: Charline Bills, M.D.    Assessment/Plan: Postop day 1 from decompress limits at L3-4 doing very well complete resistor leg pain wound is clean and dry drain output was minimal  LOS: 1 day     Sarah Johnson P 06/01/2011, 10:11 AM

## 2011-06-01 NOTE — Discharge Summary (Signed)
  Physician Discharge Summary  Patient ID: Sarah Johnson MRN: [id number] DOB/AGE: 76-Feb-1934 85 y.o.  Admit date: 05/31/2011 Discharge date: 06/01/2011  Admission Diagnoses: Lumbar spinal stenosis L3-4  Discharge Diagnoses: Same Active Problems:  * No active hospital problems. *    Discharged Condition: good  Hospital Course: Patient was admitted as a the hospital underwent the aforementioned mention operate did very well with recovered in the floor on the floor was angling and voiding spontaneously tolerating rigid diet by postop day 1 was a be discharged home scheduled followup in 41-2 weeks.  Consults: Significant Diagnostic Studies: Treatments: Discharge Exam: Blood pressure 138/73, pulse 65, temperature 98.3 F (36.8 C), temperature source Oral, resp. rate 18, SpO2 96.00%. Strength 5 out of 5 wound clean and dry  Disposition: Home   Medication List  As of 06/01/2011 10:13 AM   TAKE these medications         amLODipine 5 MG tablet   Commonly known as: NORVASC   Take 5 mg by mouth daily.      aspirin EC 81 MG tablet   Take 1 tablet (81 mg total) by mouth daily.      atorvastatin 20 MG tablet   Commonly known as: LIPITOR   Take 1 tablet (20 mg total) by mouth daily.      Calcium-Vitamin D3 600-200 MG-UNIT Tabs   Take by mouth daily.      ergocalciferol 50000 UNITS capsule   Commonly known as: VITAMIN D2   Take 50,000 Units by mouth every 30 (thirty) days.      hydrocodone-acetaminophen 5-500 MG per capsule   Commonly known as: LORCET-HD   Take 1 capsule by mouth every 6 (six) hours as needed.      losartan 100 MG tablet   Commonly known as: COZAAR   Take 100 mg by mouth daily.      metoprolol 50 MG tablet   Commonly known as: LOPRESSOR   Take 50 mg by mouth 2 (two) times daily.      mometasone-formoterol 100-5 MCG/ACT Aero   Commonly known as: DULERA   Inhale 2 puffs into the lungs 2 (two) times daily. Rinse mouth after each use      mulitivitamin with  minerals Tabs   Take 1 tablet by mouth daily.      omeprazole 20 MG capsule   Commonly known as: PRILOSEC   Take 20 mg by mouth daily.      SYNTHROID 125 MCG tablet   Generic drug: levothyroxine   Take 125 mcg by mouth Daily.           Follow-up Information    Follow up in 1 week.         Signed: Vikrant Pryce P 06/01/2011, 10:13 AM

## 2011-06-02 ENCOUNTER — Encounter (HOSPITAL_COMMUNITY): Payer: Self-pay | Admitting: Neurosurgery

## 2011-06-03 DIAGNOSIS — R5381 Other malaise: Secondary | ICD-10-CM | POA: Diagnosis not present

## 2011-06-03 DIAGNOSIS — R0602 Shortness of breath: Secondary | ICD-10-CM | POA: Diagnosis not present

## 2011-06-03 DIAGNOSIS — R509 Fever, unspecified: Secondary | ICD-10-CM | POA: Diagnosis not present

## 2011-06-04 ENCOUNTER — Ambulatory Visit (INDEPENDENT_AMBULATORY_CARE_PROVIDER_SITE_OTHER): Payer: Medicare Other | Admitting: Adult Health

## 2011-06-04 ENCOUNTER — Ambulatory Visit (INDEPENDENT_AMBULATORY_CARE_PROVIDER_SITE_OTHER)
Admission: RE | Admit: 2011-06-04 | Discharge: 2011-06-04 | Disposition: A | Discharge status: Home / Self Care | Payer: Medicare Other | Source: Ambulatory Visit | Attending: Adult Health | Admitting: Adult Health

## 2011-06-04 ENCOUNTER — Encounter: Payer: Self-pay | Admitting: Adult Health

## 2011-06-04 VITALS — BP 110/58 | HR 83 | Temp 97.5°F | Ht 61.0 in | Wt 128.0 lb

## 2011-06-04 DIAGNOSIS — J449 Chronic obstructive pulmonary disease, unspecified: Secondary | ICD-10-CM

## 2011-06-04 DIAGNOSIS — R509 Fever, unspecified: Secondary | ICD-10-CM | POA: Diagnosis not present

## 2011-06-04 DIAGNOSIS — R0602 Shortness of breath: Secondary | ICD-10-CM | POA: Diagnosis not present

## 2011-06-04 MED ORDER — MOXIFLOXACIN HCL 400 MG PO TABS: 400.0000 mg | ORAL_TABLET | Freq: Every day | ORAL | Status: AC

## 2011-06-04 NOTE — Assessment & Plan Note (Addendum)
Flare - recent back surgery  Will cover w/ more broad spectrum abx in case underlying PNA  Xray pending   Plan:  Avelox 400mg  daily for 7 days   Mucinex DM Twice daily  As needed   Fluids and rest  Tylenol As needed   Please contact office for sooner follow up if symptoms do not improve or worsen or seek emergency care  follow up Dr. Delford Field  In 2-3 weeks and As needed   I will call with xray results.

## 2011-06-04 NOTE — Progress Notes (Signed)
Subjective:    Patient ID: Sarah Johnson, female    DOB: [birth year], 78 y.o.   MRN: [id number]  HPI  This is a 78 y.o.  WF  woman with COPD and primary emphysema.  Pt dx with COPD and primary emphysema 6/09 when being eval for Thoracic aortic aneurysm   June 09, 2010 3:14 PM  No real change since 8/11. Dx Pna and over this two weeks ago. rx as outpt per PCP  Now no cough. Dyspnea is the same. No edema in the feet. No real pndrip. Had a uri after pna. At night, the mucus will come up in the throat and mucus comes up and will spit out. Has sour brash taste in mouth when awakens.   6/19 Dyspnea is the same from 2/12.  Not much cough now.  Notes min wheeze.  No qhs dyspnea.  No edema in feet.   No smoking   03/16/11  Copd f/u.  No real changes. No cough. Dyspnea is the same with exertion.  No qhs dyspnea.   >no changes   06/04/2011 Acute OV  Complains of increased dyspnea, fever and low appetite for 2 days. Underwent back surgery on 2/4 , was doing well with good pain control and mobility. Woke up 2/6 with increased dyspnea, fever and no appettite and no energy. Went to PCP , rx Cipro for possible PNA /UTI . Told xray showed possible fluid/?pna. Called today by nurse -no UTI. We were unable to get copy of xray report today.  She is still feeling weak. No chest pain , no calf pain or swelling. No hemoptysis. Has minimal cough.    Past Medical History  Diagnosis Date  . Emphysema     followed by Dr. Delford Field  . S/P thoracic aortic aneurysm repair 1/10    also with bioprosthetic aortic valve prelacement [facility]). in 10/10 she has a descending thoraic aorta anuerysm and abdominal reapir [facility]). Vascular urgeon is Dr. Ty Hilts.   . Pulmonary embolism 2010    She is no onger taking coumadin  . Hypothyroidism   . Blind right eye   . Hyperlipidemia   . S/P aortic valve replacement with bioprosthetic valve     ehco (8/11) with EF 55-60%, mild-mod MR, mild-mod TR, poorly  visualized bioprosthetic aortic valve but no significant regurgitation and gradient not significanly elevated.    . CKD (chronic kidney disease)   . HTN (hypertension)   . History of Doppler echocardiogram     carotid dopplers. (8/11) no significant stenosis  . COPD (chronic obstructive pulmonary disease)   . SVT (supraventricular tachycardia)     HOLTER MONITOR AFTER SURGERY   . GERD (gastroesophageal reflux disease)   . Arthritis   . Cataract     RT     Family History  Problem Relation Age of Onset  . Brain cancer Maternal Grandmother   . Coronary artery disease      no premature CAD     History   Social History  . Marital Status: Married    Spouse Name: N/A    Number of Children: N/A  . Years of Education: N/A   Occupational History  . Not on file.   Social History Main Topics  . Smoking status: Former Smoker -- 1.0 packs/day for 20 years    Types: Cigarettes    Quit date: 04/26/2004  . Smokeless tobacco: Never Used   Comment: quit in 2006  . Alcohol Use: No  . Drug Use:  No  . Sexually Active: Yes    Birth Control/ Protection: Post-menopausal   Other Topics Concern  . 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 Codeine   . Penicillins      Outpatient Prescriptions Prior to Visit  Medication Sig Dispense Refill  . amLODipine (NORVASC) 5 MG tablet Take 5 mg by mouth daily.        Marland Kitchen aspirin EC 81 MG tablet Take 1 tablet (81 mg total) by mouth daily.      Marland Kitchen atorvastatin (LIPITOR) 20 MG tablet Take 1 tablet (20 mg total) by mouth daily.  30 tablet  11  . Calcium Carbonate-Vitamin D (CALCIUM-VITAMIN D3) 600-200 MG-UNIT TABS Take by mouth daily.        . ergocalciferol (VITAMIN D2) 50000 UNITS capsule Take 50,000 Units by mouth every 30 (thirty) days.        . hydrocodone-acetaminophen (LORCET-HD) 5-500 MG per capsule Take 1 capsule by mouth every 6 (six) hours as needed.        Marland Kitchen losartan (COZAAR) 100 MG tablet Take 100 mg by mouth daily.        . metoprolol (LOPRESSOR) 50 MG tablet Take 50 mg by mouth 2 (two) times daily.        . mometasone-formoterol (DULERA) 100-5 MCG/ACT AERO Inhale 2 puffs into the lungs 2 (two) times daily. Rinse mouth after each use  1 Inhaler  11  . Multiple Vitamin (MULITIVITAMIN WITH MINERALS) TABS Take 1 tablet by mouth daily.      Marland Kitchen omeprazole (PRILOSEC) 20 MG capsule Take 20 mg by mouth daily.       Marland Kitchen SYNTHROID 125 MCG tablet Take 125 mcg by mouth Daily.          Review of Systems   Constitutional:   No  weight loss, night sweats,  + Fevers, chills, fatigue, lassitude. HEENT:   No headaches,  Difficulty swallowing,  Tooth/dental problems,  Sore throat,                No sneezing, itching, ear ache, nasal congestion, post nasal drip,   CV:  No chest pain,  Orthopnea, PND, swelling in lower extremities, anasarca, dizziness, palpitations  GI  No heartburn, indigestion, abdominal pain, nausea, vomiting, diarrhea, change in bowel habits, loss of appetite  Resp:  No coughing up of blood.  Marland Kitchen  No chest wall deformity  Skin: no rash or lesions.  GU: no dysuria, change in color of urine, no urgency or frequency.  No flank pain.  MS:  No joint pain or swelling.    Psych:  No change in mood or affect. No depression or anxiety.  No memory loss.  Past Pulmonary History:  Pulmonary History:  COPD primary emphysema  Pulmonary Function Test  Date: 09/20/2007  Gender: Female  Pre-Spirometry  FVC Value: 1.50 L/min Pred: 2.28 L/min % Pred: 66 %  FEV1 Value: 0.95 L Pred: 1.83 L % Pred: 52 %  FEV1/FVC Value: 63 % Pred: 83 % FEF 25-75 Value: 0.46 L/min Pred: 1.94 L/min % Pred: 23 %  Post-Spirometry  FVC Value: 1.67 L/min Pred: 2.28 L/min % Pred: 73 %  FEV1 Value: 1.10 L Pred: 1.83 L % Pred: 60 %  FEV1/FVC Value: 66 % Pred: 83 % FEF 25-75 Value: 0.58 L/min Pred: 1.94 L/min % Pred: 30 %  Lung Volumes  DLCO Value: 16.1 % %  Pred: 101 %   DLCO/VA Value: 4.50 % % Pred: 127 %  Evaluation:  severe obstruction with significant bronchodilator response    Pulmonary Function Test  Date: 12/26/2008  Height (in.): 61  Gender: Female  Pre-Spirometry  FVC Value: 1.31 L/min Pred: 2.49 L/min % Pred: 53 %  FEV1 Value: 0.94 L Pred: 1.86 L % Pred: 50 %  FEV1/FVC Value: 72 % Pred: 75 % % Pred: 96 %  FEF 25-75 Value: 0.57 L/min Pred: 1.54 L/min % Pred: 37 %  Post-Spirometry  FVC Value: 1.29 L/min Pred: 2.49 L/min % Pred: 52 %  FEV1 Value: 0.96 L Pred: 1.86 L % Pred: 52 %  FEV1/FVC Value: 75 % Pred: 75 % % Pred: 100 %  FEF 25-75 Value: 0.66 L/min Pred: 1.54 L/min % Pred: 43 %  Lung Volumes  TLC Value: 3.58 L % Pred: 78 %  RV Value: 61 L % Pred: 136 %  DLCO Value: 8.63 % % Pred: 43 %  DLCO/VA Value: 3.44 % % Pred: 78 %  Comments:  Severe airflow obstruction, moderate airtrapping, severe reduction in DLCO consistent with known dx of emphysema      Objective:   Physical Exam  Filed Vitals:   06/04/11 1632  BP: 110/58  Pulse: 83  Temp: 97.5 F (36.4 C)  TempSrc: Oral  Height: 5\' 1"  (1.549 m)  Weight: 128 lb (58.06 kg)  SpO2: 93%    Gen: Pleasant, well-nourished, in no distress,  normal affect  ENT: No lesions,  mouth clear,  oropharynx clear, no postnasal drip  Neck: No JVD, no TMG, no carotid bruits  Lungs: Coarse BS w/ no wheezing or rales.  no dullness to percussion, distant BS  Cardiovascular: RRR, heart sounds normal, no murmur or gallops, no peripheral edema  Abdomen: soft and NT, no HSM,  BS normal  Musculoskeletal: No deformities, no cyanosis or clubbing steristrips in mid lumbar-no redness noted.  No edema, neg homans sign,   Neuro: alert, non focal  Skin: Warm, no lesions or rashes        Assessment & Plan:   No problem-specific assessment & plan notes found for this encounter.   Updated Medication List Outpatient Encounter Prescriptions as of 06/04/2011  Medication Sig Dispense Refill  .  amLODipine (NORVASC) 5 MG tablet Take 5 mg by mouth daily.        Marland Kitchen aspirin EC 81 MG tablet Take 1 tablet (81 mg total) by mouth daily.      Marland Kitchen atorvastatin (LIPITOR) 20 MG tablet Take 1 tablet (20 mg total) by mouth daily.  30 tablet  11  . Calcium Carbonate-Vitamin D (CALCIUM-VITAMIN D3) 600-200 MG-UNIT TABS Take by mouth daily.        . ergocalciferol (VITAMIN D2) 50000 UNITS capsule Take 50,000 Units by mouth every 30 (thirty) days.        . hydrocodone-acetaminophen (LORCET-HD) 5-500 MG per capsule Take 1 capsule by mouth every 6 (six) hours as needed.      Marland Kitchen losartan (COZAAR) 100 MG tablet Take 100 mg by mouth daily.        . metoprolol (LOPRESSOR) 50 MG tablet Take 50 mg by mouth 2 (two) times daily.        . mometasone-formoterol (DULERA) 100-5 MCG/ACT AERO Inhale 2 puffs into the lungs 2 (two) times daily. Rinse mouth after each use  1 Inhaler  11  . Multiple Vitamin (MULITIVITAMIN WITH MINERALS) TABS Take 1 tablet by mouth daily.      Marland Kitchen  omeprazole (PRILOSEC) 20 MG capsule Take 20 mg by mouth daily.       Marland Kitchen SYNTHROID 125 MCG tablet Take 125 mcg by mouth Daily.       Marland Kitchen moxifloxacin (AVELOX) 400 MG tablet Take 1 tablet (400 mg total) by mouth daily.  7 tablet  0

## 2011-06-04 NOTE — Patient Instructions (Addendum)
Avelox 400mg  daily for 7 days   Mucinex DM Twice daily  As needed   Fluids and rest  Tylenol As needed   Please contact office for sooner follow up if symptoms do not improve or worsen or seek emergency care  follow up Dr. Delford Field  In 2-3 weeks and As needed   I will call with xray results.

## 2011-06-23 ENCOUNTER — Ambulatory Visit (INDEPENDENT_AMBULATORY_CARE_PROVIDER_SITE_OTHER): Payer: Medicare Other | Admitting: Critical Care Medicine

## 2011-06-23 ENCOUNTER — Encounter: Payer: Self-pay | Admitting: Critical Care Medicine

## 2011-06-23 DIAGNOSIS — J449 Chronic obstructive pulmonary disease, unspecified: Secondary | ICD-10-CM | POA: Diagnosis not present

## 2011-06-23 NOTE — Patient Instructions (Signed)
No change in medications. Return in         4 months 

## 2011-06-23 NOTE — Progress Notes (Signed)
Subjective:    Patient ID: Sarah Johnson, female    DOB: 1932/06/17, 78 y.o.   MRN: [id number]  HPI  This is a 78 y.o.  WF  woman with COPD and primary emphysema.  Pt dx with COPD and primary emphysema 6/09 when being eval for Thoracic aortic aneurysm  2/8/13Acute OV  Complains of increased dyspnea, fever and low appetite for 2 days. Underwent back surgery on 2/4 , was doing well with good pain control and mobility. Woke up 2/6 with increased dyspnea, fever and no appettite and no energy. Went to PCP , rx Cipro for possible PNA /UTI . Told xray showed possible fluid/?pna. Called today by nurse -no UTI. We were unable to get copy of xray report today.  She is still feeling weak. No chest pain , no calf pain or swelling. No hemoptysis. Has minimal cough.   06/23/11 Pt seen by NP 06/04/11 and Rx was : Pt used cipro instead and did well with this.  Was orig Rx avelox Mucinex DM Twice daily As needed  Since the doing well  CAT score 11. Now no chest pain.  No real wheeze. Pt denies any significant sore throat, nasal congestion or excess secretions, fever, chills, sweats, unintended weight loss, pleurtic or exertional chest pain, orthopnea PND, or leg swelling Pt denies any increase in rescue therapy over baseline, denies waking up needing it or having any early am or nocturnal exacerbations of coughing/wheezing/or dyspnea. Pt also denies any obvious fluctuation in symptoms with  weather or environmental change or other alleviating or aggravating factors    Past Medical History  Diagnosis Date  . Emphysema     followed by Dr. Delford Field  . S/P thoracic aortic aneurysm repair 1/10    also with bioprosthetic aortic valve prelacement [facility]). in 10/10 she has a descending thoraic aorta anuerysm and abdominal reapir [facility]). Vascular urgeon is Dr. Ty Hilts.   . Pulmonary embolism 2010    She is no onger taking coumadin  . Hypothyroidism   . Blind right eye   . Hyperlipidemia   . S/P  aortic valve replacement with bioprosthetic valve     ehco (8/11) with EF 55-60%, mild-mod MR, mild-mod TR, poorly visualized bioprosthetic aortic valve but no significant regurgitation and gradient not significanly elevated.    . CKD (chronic kidney disease)   . HTN (hypertension)   . History of Doppler echocardiogram     carotid dopplers. (8/11) no significant stenosis  . COPD (chronic obstructive pulmonary disease)   . SVT (supraventricular tachycardia)     HOLTER MONITOR AFTER SURGERY   . GERD (gastroesophageal reflux disease)   . Arthritis   . Cataract     RT     Family History  Problem Relation Age of Onset  . Brain cancer Maternal Grandmother   . Coronary artery disease      no premature CAD     History   Social History  . Marital Status: Married    Spouse Name: N/A    Number of Children: N/A  . Years of Education: N/A   Occupational History  . Not on file.   Social History Main Topics  . Smoking status: Former Smoker -- 1.0 packs/day for 20 years    Types: Cigarettes    Quit date: 04/26/2004  . Smokeless tobacco: Never Used   Comment: quit in 2006  . Alcohol Use: No  . Drug Use: No  . Sexually Active: Yes    Birth Control/  Protection: Post-menopausal   Other Topics Concern  . 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 Codeine   . Penicillins      Outpatient Prescriptions Prior to Visit  Medication Sig Dispense Refill  . amLODipine (NORVASC) 5 MG tablet Take 5 mg by mouth daily.        Marland Kitchen aspirin EC 81 MG tablet Take 1 tablet (81 mg total) by mouth daily.      Marland Kitchen atorvastatin (LIPITOR) 20 MG tablet Take 1 tablet (20 mg total) by mouth daily.  30 tablet  11  . Calcium Carbonate-Vitamin D (CALCIUM-VITAMIN D3) 600-200 MG-UNIT TABS Take by mouth daily.        . ergocalciferol (VITAMIN D2) 50000 UNITS capsule Take 50,000 Units by mouth every 30 (thirty) days.        .  hydrocodone-acetaminophen (LORCET-HD) 5-500 MG per capsule Take 1 capsule by mouth every 6 (six) hours as needed.      Marland Kitchen losartan (COZAAR) 100 MG tablet Take 100 mg by mouth daily.        . metoprolol (LOPRESSOR) 50 MG tablet Take 50 mg by mouth 2 (two) times daily.        . mometasone-formoterol (DULERA) 100-5 MCG/ACT AERO Inhale 2 puffs into the lungs 2 (two) times daily. Rinse mouth after each use  1 Inhaler  11  . Multiple Vitamin (MULITIVITAMIN WITH MINERALS) TABS Take 1 tablet by mouth daily.      Marland Kitchen omeprazole (PRILOSEC) 20 MG capsule Take 20 mg by mouth daily.       Marland Kitchen SYNTHROID 125 MCG tablet Take 125 mcg by mouth Daily.          Review of Systems   Constitutional:   No  weight loss, night sweats,  + Fevers, chills, fatigue, lassitude. HEENT:   No headaches,  Difficulty swallowing,  Tooth/dental problems,  Sore throat,                No sneezing, itching, ear ache, nasal congestion, post nasal drip,   CV:  No chest pain,  Orthopnea, PND, swelling in lower extremities, anasarca, dizziness, palpitations  GI  No heartburn, indigestion, abdominal pain, nausea, vomiting, diarrhea, change in bowel habits, loss of appetite  Resp:  No coughing up of blood.  Marland Kitchen  No chest wall deformity  Skin: no rash or lesions.  GU: no dysuria, change in color of urine, no urgency or frequency.  No flank pain.  MS:  No joint pain or swelling.    Psych:  No change in mood or affect. No depression or anxiety.  No memory loss.  Past Pulmonary History:  Pulmonary History:  COPD primary emphysema  Pulmonary Function Test  Date: 09/20/2007  Gender: Female  Pre-Spirometry  FVC Value: 1.50 L/min Pred: 2.28 L/min % Pred: 66 %  FEV1 Value: 0.95 L Pred: 1.83 L % Pred: 52 %  FEV1/FVC Value: 63 % Pred: 83 % FEF 25-75 Value: 0.46 L/min Pred: 1.94 L/min % Pred: 23 %  Post-Spirometry  FVC Value: 1.67 L/min Pred: 2.28 L/min % Pred: 73 %  FEV1 Value: 1.10 L Pred: 1.83 L % Pred: 60 %  FEV1/FVC Value: 66 %  Pred: 83 % FEF 25-75 Value: 0.58 L/min Pred: 1.94 L/min % Pred: 30 %  Lung Volumes  DLCO Value: 16.1 % % Pred: 101 %  DLCO/VA Value: 4.50 % % Pred:  127 %  Evaluation:  severe obstruction with significant bronchodilator response    Pulmonary Function Test  Date: 12/26/2008  Height (in.): 61  Gender: Female  Pre-Spirometry  FVC Value: 1.31 L/min Pred: 2.49 L/min % Pred: 53 %  FEV1 Value: 0.94 L Pred: 1.86 L % Pred: 50 %  FEV1/FVC Value: 72 % Pred: 75 % % Pred: 96 %  FEF 25-75 Value: 0.57 L/min Pred: 1.54 L/min % Pred: 37 %  Post-Spirometry  FVC Value: 1.29 L/min Pred: 2.49 L/min % Pred: 52 %  FEV1 Value: 0.96 L Pred: 1.86 L % Pred: 52 %  FEV1/FVC Value: 75 % Pred: 75 % % Pred: 100 %  FEF 25-75 Value: 0.66 L/min Pred: 1.54 L/min % Pred: 43 %  Lung Volumes  TLC Value: 3.58 L % Pred: 78 %  RV Value: 61 L % Pred: 136 %  DLCO Value: 8.63 % % Pred: 43 %  DLCO/VA Value: 3.44 % % Pred: 78 %  Comments:  Severe airflow obstruction, moderate airtrapping, severe reduction in DLCO consistent with known dx of emphysema      Objective:   Physical Exam  Filed Vitals:   06/23/11 1413  BP: 122/76  Pulse: 51  Temp: 97.9 F (36.6 C)  TempSrc: Oral  Height: 5\' 1"  (1.549 m)  Weight: 131 lb 6.4 oz (59.603 kg)  SpO2: 96%    Gen: Pleasant, well-nourished, in no distress,  normal affect  ENT: No lesions,  mouth clear,  oropharynx clear, no postnasal drip  Neck: No JVD, no TMG, no carotid bruits  Lungs: Coarse BS w/ no wheezing or rales.  no dullness to percussion, distant BS  Cardiovascular: RRR, heart sounds normal, no murmur or gallops, no peripheral edema  Abdomen: soft and NT, no HSM,  BS normal  Musculoskeletal: No deformities, no cyanosis or clubbing steristrips in mid lumbar-no redness noted.  No edema, neg homans sign,   Neuro: alert, non focal  Skin: Warm, no lesions or rashes        Assessment & Plan:   COPD Copd Gold C  Stable s/ p recent exacerbation Plan No  change in inhaled or maintenance medications. Return in  4 months     Updated Medication List Outpatient Encounter Prescriptions as of 06/23/2011  Medication Sig Dispense Refill  . amLODipine (NORVASC) 5 MG tablet Take 5 mg by mouth daily.        Marland Kitchen aspirin EC 81 MG tablet Take 1 tablet (81 mg total) by mouth daily.      Marland Kitchen atorvastatin (LIPITOR) 20 MG tablet Take 1 tablet (20 mg total) by mouth daily.  30 tablet  11  . Calcium Carbonate-Vitamin D (CALCIUM-VITAMIN D3) 600-200 MG-UNIT TABS Take by mouth daily.        . ergocalciferol (VITAMIN D2) 50000 UNITS capsule Take 50,000 Units by mouth every 30 (thirty) days.        . hydrocodone-acetaminophen (LORCET-HD) 5-500 MG per capsule Take 1 capsule by mouth every 6 (six) hours as needed.      Marland Kitchen losartan (COZAAR) 100 MG tablet Take 100 mg by mouth daily.        . metoprolol (LOPRESSOR) 50 MG tablet Take 50 mg by mouth 2 (two) times daily.        . mometasone-formoterol (DULERA) 100-5 MCG/ACT AERO Inhale 2 puffs into the lungs 2 (two) times daily. Rinse mouth after each use  1 Inhaler  11  . Multiple Vitamin (MULITIVITAMIN WITH MINERALS) TABS Take 1 tablet  by mouth daily.      Marland Kitchen omeprazole (PRILOSEC) 20 MG capsule Take 20 mg by mouth daily.       Marland Kitchen SYNTHROID 125 MCG tablet Take 125 mcg by mouth Daily.

## 2011-06-24 NOTE — Assessment & Plan Note (Signed)
Copd Gold C  Stable s/ p recent exacerbation Plan No change in inhaled or maintenance medications. Return in  4 months

## 2011-07-02 DIAGNOSIS — B9789 Other viral agents as the cause of diseases classified elsewhere: Secondary | ICD-10-CM | POA: Diagnosis not present

## 2011-07-02 DIAGNOSIS — B029 Zoster without complications: Secondary | ICD-10-CM | POA: Diagnosis not present

## 2011-07-02 DIAGNOSIS — R110 Nausea: Secondary | ICD-10-CM | POA: Diagnosis not present

## 2011-07-22 DIAGNOSIS — E559 Vitamin D deficiency, unspecified: Secondary | ICD-10-CM | POA: Diagnosis not present

## 2011-07-22 DIAGNOSIS — N289 Disorder of kidney and ureter, unspecified: Secondary | ICD-10-CM | POA: Diagnosis not present

## 2011-07-22 DIAGNOSIS — I10 Essential (primary) hypertension: Secondary | ICD-10-CM | POA: Diagnosis not present

## 2011-09-06 ENCOUNTER — Encounter: Payer: Self-pay | Admitting: Critical Care Medicine

## 2011-09-06 ENCOUNTER — Ambulatory Visit (INDEPENDENT_AMBULATORY_CARE_PROVIDER_SITE_OTHER): Payer: Medicare Other | Admitting: Critical Care Medicine

## 2011-09-06 VITALS — BP 112/58 | HR 58 | Temp 97.7°F | Ht 61.0 in | Wt 128.0 lb

## 2011-09-06 DIAGNOSIS — J449 Chronic obstructive pulmonary disease, unspecified: Secondary | ICD-10-CM | POA: Diagnosis not present

## 2011-09-06 DIAGNOSIS — J4489 Other specified chronic obstructive pulmonary disease: Secondary | ICD-10-CM

## 2011-09-06 MED ORDER — MOMETASONE FURO-FORMOTEROL FUM 100-5 MCG/ACT IN AERO: 2.0000 | INHALATION_SPRAY | Freq: Two times a day (BID) | RESPIRATORY_TRACT | Status: DC

## 2011-09-06 NOTE — Patient Instructions (Signed)
No change in medications. Return in        6 months        

## 2011-09-06 NOTE — Progress Notes (Signed)
Subjective:    Patient ID: Sarah Johnson, female    DOB: 1933-03-22, 78 y.o.   MRN: [id number]  HPI  This is a 78 y.o.  WF  woman with COPD and primary emphysema.  Pt dx with COPD and primary emphysema 6/09 when being eval for Thoracic aortic aneurysm  09/06/2011 No recent exac. Dyspnea is the same.  Notes min wheezing. No real mucus or cough. Pt denies any significant sore throat, nasal congestion or excess secretions, fever, chills, sweats, unintended weight loss, pleurtic or exertional chest pain, orthopnea PND, or leg swelling Pt denies any increase in rescue therapy over baseline, denies waking up needing it or having any early am or nocturnal exacerbations of coughing/wheezing/or dyspnea. Pt also denies any obvious fluctuation in symptoms with  weather or environmental change or other alleviating or aggravating factors    Past Medical History  Diagnosis Date  . Emphysema     followed by Dr. Delford Field  . S/P thoracic aortic aneurysm repair 1/10    also with bioprosthetic aortic valve prelacement [facility]). in 10/10 she has a descending thoraic aorta anuerysm and abdominal reapir [facility]). Vascular urgeon is Dr. Ty Hilts.   . Pulmonary embolism 2010    She is no onger taking coumadin  . Hypothyroidism   . Blind right eye   . Hyperlipidemia   . S/P aortic valve replacement with bioprosthetic valve     ehco (8/11) with EF 55-60%, mild-mod MR, mild-mod TR, poorly visualized bioprosthetic aortic valve but no significant regurgitation and gradient not significanly elevated.    . CKD (chronic kidney disease)   . HTN (hypertension)   . History of Doppler echocardiogram     carotid dopplers. (8/11) no significant stenosis  . COPD (chronic obstructive pulmonary disease)   . SVT (supraventricular tachycardia)     HOLTER MONITOR AFTER SURGERY   . GERD (gastroesophageal reflux disease)   . Arthritis   . Cataract     RT     Family History  Problem Relation Age of Onset  .  Brain cancer Maternal Grandmother   . Coronary artery disease      no premature CAD     History   Social History  . Marital Status: Married    Spouse Name: N/A    Number of Children: N/A  . Years of Education: N/A   Occupational History  . Not on file.   Social History Main Topics  . Smoking status: Former Smoker -- 1.0 packs/day for 20 years    Types: Cigarettes    Quit date: 04/26/2004  . Smokeless tobacco: Never Used   Comment: quit in 2006  . Alcohol Use: No  . Drug Use: No  . Sexually Active: Yes    Birth Control/ Protection: Post-menopausal   Other Topics Concern  . 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 Codeine   . Penicillins      Outpatient Prescriptions Prior to Visit  Medication Sig Dispense Refill  . amLODipine (NORVASC) 5 MG tablet Take 5 mg by mouth daily.        Marland Kitchen aspirin EC 81 MG tablet Take 1 tablet (81 mg total) by mouth daily.      Marland Kitchen atorvastatin (LIPITOR) 20 MG tablet Take 1 tablet (20 mg total) by mouth daily.  30 tablet  11  . Calcium Carbonate-Vitamin D (CALCIUM-VITAMIN D3) 600-200 MG-UNIT TABS  Take by mouth daily.        . ergocalciferol (VITAMIN D2) 50000 UNITS capsule Take 50,000 Units by mouth every 30 (thirty) days.        . hydrocodone-acetaminophen (LORCET-HD) 5-500 MG per capsule Take 1 capsule by mouth every 6 (six) hours as needed.      Marland Kitchen losartan (COZAAR) 100 MG tablet Take 100 mg by mouth daily.        . metoprolol (LOPRESSOR) 50 MG tablet Take 50 mg by mouth 2 (two) times daily.        . Multiple Vitamin (MULITIVITAMIN WITH MINERALS) TABS Take 1 tablet by mouth daily.      Marland Kitchen omeprazole (PRILOSEC) 20 MG capsule Take 20 mg by mouth daily.       Marland Kitchen SYNTHROID 125 MCG tablet Take 125 mcg by mouth Daily.       . mometasone-formoterol (DULERA) 100-5 MCG/ACT AERO Inhale 2 puffs into the lungs 2 (two) times daily. Rinse mouth after each use  1 Inhaler   11     Review of Systems   Constitutional:   No  weight loss, night sweats,  + Fevers, chills, fatigue, lassitude. HEENT:   No headaches,  Difficulty swallowing,  Tooth/dental problems,  Sore throat,                No sneezing, itching, ear ache, nasal congestion, post nasal drip,   CV:  No chest pain,  Orthopnea, PND, swelling in lower extremities, anasarca, dizziness, palpitations  GI  No heartburn, indigestion, abdominal pain, nausea, vomiting, diarrhea, change in bowel habits, loss of appetite  Resp:  No coughing up of blood.  Marland Kitchen  No chest wall deformity  Skin: no rash or lesions.  GU: no dysuria, change in color of urine, no urgency or frequency.  No flank pain.  MS:  No joint pain or swelling.    Psych:  No change in mood or affect. No depression or anxiety.  No memory loss.  Past Pulmonary History:  Pulmonary History:  COPD primary emphysema  Pulmonary Function Test  Date: 09/20/2007  Gender: Female  Pre-Spirometry  FVC Value: 1.50 L/min Pred: 2.28 L/min % Pred: 66 %  FEV1 Value: 0.95 L Pred: 1.83 L % Pred: 52 %  FEV1/FVC Value: 63 % Pred: 83 % FEF 25-75 Value: 0.46 L/min Pred: 1.94 L/min % Pred: 23 %  Post-Spirometry  FVC Value: 1.67 L/min Pred: 2.28 L/min % Pred: 73 %  FEV1 Value: 1.10 L Pred: 1.83 L % Pred: 60 %  FEV1/FVC Value: 66 % Pred: 83 % FEF 25-75 Value: 0.58 L/min Pred: 1.94 L/min % Pred: 30 %  Lung Volumes  DLCO Value: 16.1 % % Pred: 101 %  DLCO/VA Value: 4.50 % % Pred: 127 %  Evaluation:  severe obstruction with significant bronchodilator response    Pulmonary Function Test  Date: 12/26/2008  Height (in.): 61  Gender: Female  Pre-Spirometry  FVC Value: 1.31 L/min Pred: 2.49 L/min % Pred: 53 %  FEV1 Value: 0.94 L Pred: 1.86 L % Pred: 50 %  FEV1/FVC Value: 72 % Pred: 75 % % Pred: 96 %  FEF 25-75 Value: 0.57 L/min Pred: 1.54 L/min % Pred: 37 %  Post-Spirometry  FVC Value: 1.29 L/min Pred: 2.49 L/min % Pred: 52 %  FEV1 Value: 0.96 L Pred: 1.86  L % Pred: 52 %  FEV1/FVC Value: 75 % Pred: 75 % % Pred: 100 %  FEF 25-75 Value: 0.66 L/min Pred: 1.54 L/min %  Pred: 43 %  Lung Volumes  TLC Value: 3.58 L % Pred: 78 %  RV Value: 61 L % Pred: 136 %  DLCO Value: 8.63 % % Pred: 43 %  DLCO/VA Value: 3.44 % % Pred: 78 %  Comments:  Severe airflow obstruction, moderate airtrapping, severe reduction in DLCO consistent with known dx of emphysema      Objective:   Physical Exam  Filed Vitals:   09/06/11 1038  BP: 112/58  Pulse: 58  Temp: 97.7 F (36.5 C)  TempSrc: Oral  Height: 5\' 1"  (1.549 m)  Weight: 128 lb (58.06 kg)  SpO2: 96%    Gen: Pleasant, well-nourished, in no distress,  normal affect  ENT: No lesions,  mouth clear,  oropharynx clear, no postnasal drip  Neck: No JVD, no TMG, no carotid bruits  Lungs: Coarse BS w/ no wheezing or rales.  no dullness to percussion, distant BS  Cardiovascular: RRR, heart sounds normal, no murmur or gallops, no peripheral edema  Abdomen: soft and NT, no HSM,  BS normal  Musculoskeletal: No deformities, no cyanosis or clubbing steristrips in mid lumbar-no redness noted.  No edema, neg homans sign,   Neuro: alert, non focal  Skin: Warm, no lesions or rashes        Assessment & Plan:   COPD Chronic obstructive lung disease gold stage C. stable at this time Plan Maintain inhaled medications as prescribed     Updated Medication List Outpatient Encounter Prescriptions as of 09/06/2011  Medication Sig Dispense Refill  . amLODipine (NORVASC) 5 MG tablet Take 5 mg by mouth daily.        Marland Kitchen aspirin EC 81 MG tablet Take 1 tablet (81 mg total) by mouth daily.      Marland Kitchen atorvastatin (LIPITOR) 20 MG tablet Take 1 tablet (20 mg total) by mouth daily.  30 tablet  11  . Calcium Carbonate-Vitamin D (CALCIUM-VITAMIN D3) 600-200 MG-UNIT TABS Take by mouth daily.        . ergocalciferol (VITAMIN D2) 50000 UNITS capsule Take 50,000 Units by mouth every 30 (thirty) days.        .  hydrocodone-acetaminophen (LORCET-HD) 5-500 MG per capsule Take 1 capsule by mouth every 6 (six) hours as needed.      Marland Kitchen losartan (COZAAR) 100 MG tablet Take 100 mg by mouth daily.        . metoprolol (LOPRESSOR) 50 MG tablet Take 50 mg by mouth 2 (two) times daily.        . mometasone-formoterol (DULERA) 100-5 MCG/ACT AERO Inhale 2 puffs into the lungs 2 (two) times daily. Rinse mouth after each use  1 Inhaler  11  . Multiple Vitamin (MULITIVITAMIN WITH MINERALS) TABS Take 1 tablet by mouth daily.      Marland Kitchen omeprazole (PRILOSEC) 20 MG capsule Take 20 mg by mouth daily.       Marland Kitchen SYNTHROID 125 MCG tablet Take 125 mcg by mouth Daily.       Marland Kitchen DISCONTD: mometasone-formoterol (DULERA) 100-5 MCG/ACT AERO Inhale 2 puffs into the lungs 2 (two) times daily. Rinse mouth after each use  1 Inhaler  11  . DISCONTD: mometasone-formoterol (DULERA) 100-5 MCG/ACT AERO Inhale 2 puffs into the lungs 2 (two) times daily. Rinse mouth after each use  1 Inhaler  11

## 2011-09-06 NOTE — Assessment & Plan Note (Signed)
Chronic obstructive lung disease gold stage C. stable at this time Plan Maintain inhaled medications as prescribed

## 2011-09-30 DIAGNOSIS — R911 Solitary pulmonary nodule: Secondary | ICD-10-CM | POA: Diagnosis not present

## 2011-09-30 DIAGNOSIS — K5730 Diverticulosis of large intestine without perforation or abscess without bleeding: Secondary | ICD-10-CM | POA: Diagnosis not present

## 2011-09-30 DIAGNOSIS — I716 Thoracoabdominal aortic aneurysm, without rupture: Secondary | ICD-10-CM | POA: Diagnosis not present

## 2011-10-20 DIAGNOSIS — D239 Other benign neoplasm of skin, unspecified: Secondary | ICD-10-CM | POA: Diagnosis not present

## 2011-10-20 DIAGNOSIS — L821 Other seborrheic keratosis: Secondary | ICD-10-CM | POA: Diagnosis not present

## 2011-10-20 DIAGNOSIS — D692 Other nonthrombocytopenic purpura: Secondary | ICD-10-CM | POA: Diagnosis not present

## 2011-10-25 DIAGNOSIS — E559 Vitamin D deficiency, unspecified: Secondary | ICD-10-CM | POA: Diagnosis not present

## 2011-10-25 DIAGNOSIS — Z0000 Encounter for general adult medical examination without abnormal findings: Secondary | ICD-10-CM | POA: Diagnosis not present

## 2011-10-25 DIAGNOSIS — E039 Hypothyroidism, unspecified: Secondary | ICD-10-CM | POA: Diagnosis not present

## 2011-10-25 DIAGNOSIS — I10 Essential (primary) hypertension: Secondary | ICD-10-CM | POA: Diagnosis not present

## 2011-11-15 DIAGNOSIS — R0602 Shortness of breath: Secondary | ICD-10-CM | POA: Diagnosis not present

## 2011-11-15 DIAGNOSIS — R609 Edema, unspecified: Secondary | ICD-10-CM | POA: Diagnosis not present

## 2011-11-17 ENCOUNTER — Telehealth: Payer: Self-pay | Admitting: Critical Care Medicine

## 2011-11-17 ENCOUNTER — Encounter: Payer: Self-pay | Admitting: Critical Care Medicine

## 2011-11-17 ENCOUNTER — Ambulatory Visit (INDEPENDENT_AMBULATORY_CARE_PROVIDER_SITE_OTHER): Payer: Medicare Other | Admitting: Critical Care Medicine

## 2011-11-17 VITALS — BP 112/68 | HR 58 | Temp 97.4°F | Ht 61.0 in | Wt 127.4 lb

## 2011-11-17 DIAGNOSIS — J449 Chronic obstructive pulmonary disease, unspecified: Secondary | ICD-10-CM

## 2011-11-17 NOTE — Assessment & Plan Note (Signed)
Stable Copd Golds C Plan No change in inhaled or maintenance medications. Return in 4 months

## 2011-11-17 NOTE — Telephone Encounter (Signed)
[id number] x 1 

## 2011-11-17 NOTE — Patient Instructions (Addendum)
No change in medications. Return in         4 months 

## 2011-11-17 NOTE — Progress Notes (Signed)
Subjective:    Patient ID: Sarah Johnson, female    DOB: 1932-09-04, 78 y.o.   MRN: [id number]  HPI  This is a 78 y.o.  WF  woman with COPD and primary emphysema.  Pt dx with COPD and primary emphysema 6/09 when being eval for Thoracic aortic aneurysm  09/06/2011 No recent exac. Dyspnea is the same.  Notes min wheezing. No real mucus or cough. Pt denies any significant sore throat, nasal congestion or excess secretions, fever, chills, sweats, unintended weight loss, pleurtic or exertional chest pain, orthopnea PND, or leg swelling Pt denies any increase in rescue therapy over baseline, denies waking up needing it or having any early am or nocturnal exacerbations of coughing/wheezing/or dyspnea. Pt also denies any obvious fluctuation in symptoms with  weather or environmental change or other alleviating or aggravating factors  11/17/2011 No changes since last OV.  No real cough or chest pain.  No mucus.  No real wheeze. Pt denies any significant sore throat, nasal congestion or excess secretions, fever, chills, sweats, unintended weight loss, pleurtic or exertional chest pain, orthopnea PND, or leg swelling Pt denies any increase in rescue therapy over baseline, denies waking up needing it or having any early am or nocturnal exacerbations of coughing/wheezing/or dyspnea. Pt also denies any obvious fluctuation in symptoms with  weather or environmental change or other alleviating or aggravating factors     Past Medical History  Diagnosis Date  . Emphysema     followed by Dr. Delford Field  . S/P thoracic aortic aneurysm repair 1/10    also with bioprosthetic aortic valve prelacement [facility]). in 10/10 she has a descending thoraic aorta anuerysm and abdominal reapir [facility]). Vascular urgeon is Dr. Ty Hilts.   . Pulmonary embolism 2010    She is no onger taking coumadin  . Hypothyroidism   . Blind right eye   . Hyperlipidemia   . S/P aortic valve replacement with bioprosthetic  valve     ehco (8/11) with EF 55-60%, mild-mod MR, mild-mod TR, poorly visualized bioprosthetic aortic valve but no significant regurgitation and gradient not significanly elevated.    . CKD (chronic kidney disease)   . HTN (hypertension)   . History of Doppler echocardiogram     carotid dopplers. (8/11) no significant stenosis  . COPD (chronic obstructive pulmonary disease)   . SVT (supraventricular tachycardia)     HOLTER MONITOR AFTER SURGERY   . GERD (gastroesophageal reflux disease)   . Arthritis   . Cataract     RT     Family History  Problem Relation Age of Onset  . Brain cancer Maternal Grandmother   . Coronary artery disease      no premature CAD     History   Social History  . Marital Status: Married    Spouse Name: N/A    Number of Children: N/A  . Years of Education: N/A   Occupational History  . Not on file.   Social History Main Topics  . Smoking status: Former Smoker -- 1.0 packs/day for 20 years    Types: Cigarettes    Quit date: 04/26/2004  . Smokeless tobacco: Never Used   Comment: quit in 2006  . Alcohol Use: No  . Drug Use: No  . Sexually Active: Yes    Birth Control/ Protection: Post-menopausal   Other Topics Concern  . 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 Codeine   . Penicillins      Outpatient Prescriptions Prior to Visit  Medication Sig Dispense Refill  . amLODipine (NORVASC) 5 MG tablet Take 5 mg by mouth daily.        Marland Kitchen aspirin EC 81 MG tablet Take 1 tablet (81 mg total) by mouth daily.      Marland Kitchen atorvastatin (LIPITOR) 20 MG tablet Take 1 tablet (20 mg total) by mouth daily.  30 tablet  11  . Calcium Carbonate-Vitamin D (CALCIUM-VITAMIN D3) 600-200 MG-UNIT TABS Take by mouth daily.        . ergocalciferol (VITAMIN D2) 50000 UNITS capsule Take 50,000 Units by mouth every 30 (thirty) days.        . hydrocodone-acetaminophen (LORCET-HD) 5-500 MG  per capsule Take 1 capsule by mouth every 6 (six) hours as needed.      Marland Kitchen losartan (COZAAR) 100 MG tablet Take 100 mg by mouth daily.        . metoprolol (LOPRESSOR) 50 MG tablet Take 50 mg by mouth 2 (two) times daily.        . mometasone-formoterol (DULERA) 100-5 MCG/ACT AERO Inhale 2 puffs into the lungs 2 (two) times daily. Rinse mouth after each use  1 Inhaler  11  . Multiple Vitamin (MULITIVITAMIN WITH MINERALS) TABS Take 1 tablet by mouth daily.      Marland Kitchen omeprazole (PRILOSEC) 20 MG capsule Take 20 mg by mouth daily.       Marland Kitchen SYNTHROID 125 MCG tablet Take 125 mcg by mouth Daily.          Review of Systems   Constitutional:   No  weight loss, night sweats,  + Fevers, chills, fatigue, lassitude. HEENT:   No headaches,  Difficulty swallowing,  Tooth/dental problems,  Sore throat,                No sneezing, itching, ear ache, nasal congestion, post nasal drip,   CV:  No chest pain,  Orthopnea, PND, swelling in lower extremities, anasarca, dizziness, palpitations  GI  No heartburn, indigestion, abdominal pain, nausea, vomiting, diarrhea, change in bowel habits, loss of appetite  Resp:  No coughing up of blood.  Marland Kitchen  No chest wall deformity  Skin: no rash or lesions.  GU: no dysuria, change in color of urine, no urgency or frequency.  No flank pain.  MS:  No joint pain or swelling.    Psych:  No change in mood or affect. No depression or anxiety.  No memory loss.  Past Pulmonary History:  Pulmonary History:  COPD primary emphysema  Pulmonary Function Test  Date: 09/20/2007  Gender: Female  Pre-Spirometry  FVC Value: 1.50 L/min Pred: 2.28 L/min % Pred: 66 %  FEV1 Value: 0.95 L Pred: 1.83 L % Pred: 52 %  FEV1/FVC Value: 63 % Pred: 83 % FEF 25-75 Value: 0.46 L/min Pred: 1.94 L/min % Pred: 23 %  Post-Spirometry  FVC Value: 1.67 L/min Pred: 2.28 L/min % Pred: 73 %  FEV1 Value: 1.10 L Pred: 1.83 L % Pred: 60 %  FEV1/FVC Value: 66 % Pred: 83 % FEF 25-75 Value: 0.58 L/min Pred: 1.94  L/min % Pred: 30 %  Lung Volumes  DLCO Value: 16.1 % % Pred: 101 %  DLCO/VA Value: 4.50 % % Pred: 127 %  Evaluation:  severe obstruction with significant bronchodilator response    Pulmonary Function Test  Date: 12/26/2008  Height (in.): 61  Gender: Female  Pre-Spirometry  FVC Value:  1.31 L/min Pred: 2.49 L/min % Pred: 53 %  FEV1 Value: 0.94 L Pred: 1.86 L % Pred: 50 %  FEV1/FVC Value: 72 % Pred: 75 % % Pred: 96 %  FEF 25-75 Value: 0.57 L/min Pred: 1.54 L/min % Pred: 37 %  Post-Spirometry  FVC Value: 1.29 L/min Pred: 2.49 L/min % Pred: 52 %  FEV1 Value: 0.96 L Pred: 1.86 L % Pred: 52 %  FEV1/FVC Value: 75 % Pred: 75 % % Pred: 100 %  FEF 25-75 Value: 0.66 L/min Pred: 1.54 L/min % Pred: 43 %  Lung Volumes  TLC Value: 3.58 L % Pred: 78 %  RV Value: 61 L % Pred: 136 %  DLCO Value: 8.63 % % Pred: 43 %  DLCO/VA Value: 3.44 % % Pred: 78 %  Comments:  Severe airflow obstruction, moderate airtrapping, severe reduction in DLCO consistent with known dx of emphysema      Objective:   Physical Exam  Filed Vitals:   11/17/11 1130  BP: 112/68  Pulse: 58  Temp: 97.4 F (36.3 C)  TempSrc: Oral  Height: 5\' 1"  (1.549 m)  Weight: 57.788 kg (127 lb 6.4 oz)  SpO2: 96%    Gen: Pleasant, well-nourished, in no distress,  normal affect  ENT: No lesions,  mouth clear,  oropharynx clear, no postnasal drip  Neck: No JVD, no TMG, no carotid bruits  Lungs: Coarse BS w/ no wheezing or rales.  no dullness to percussion, distant BS  Cardiovascular: RRR, heart sounds normal, no murmur or gallops, no peripheral edema  Abdomen: soft and NT, no HSM,  BS normal  Musculoskeletal: No deformities, no cyanosis or clubbing steristrips in mid lumbar-no redness noted.  No edema, neg homans sign,   Neuro: alert, non focal  Skin: Warm, no lesions or rashes        Assessment & Plan:   COPD Stable Copd Golds C Plan No change in inhaled or maintenance medications. Return in 4  months    Updated Medication List Outpatient Encounter Prescriptions as of 11/17/2011  Medication Sig Dispense Refill  . amLODipine (NORVASC) 5 MG tablet Take 5 mg by mouth daily.        Marland Kitchen aspirin EC 81 MG tablet Take 1 tablet (81 mg total) by mouth daily.      Marland Kitchen atorvastatin (LIPITOR) 20 MG tablet Take 1 tablet (20 mg total) by mouth daily.  30 tablet  11  . Calcium Carbonate-Vitamin D (CALCIUM-VITAMIN D3) 600-200 MG-UNIT TABS Take by mouth daily.        . ergocalciferol (VITAMIN D2) 50000 UNITS capsule Take 50,000 Units by mouth every 30 (thirty) days.        . furosemide (LASIX) 20 MG tablet Take as directed      . hydrocodone-acetaminophen (LORCET-HD) 5-500 MG per capsule Take 1 capsule by mouth every 6 (six) hours as needed.      Marland Kitchen losartan (COZAAR) 100 MG tablet Take 100 mg by mouth daily.        . metoprolol (LOPRESSOR) 50 MG tablet Take 50 mg by mouth 2 (two) times daily.        . mometasone-formoterol (DULERA) 100-5 MCG/ACT AERO Inhale 2 puffs into the lungs 2 (two) times daily. Rinse mouth after each use  1 Inhaler  11  . Multiple Vitamin (MULITIVITAMIN WITH MINERALS) TABS Take 1 tablet by mouth daily.      Marland Kitchen omeprazole (PRILOSEC) 20 MG capsule Take 20 mg by mouth daily.       Marland Kitchen  SYNTHROID 125 MCG tablet Take 125 mcg by mouth Daily.

## 2011-11-18 NOTE — Telephone Encounter (Signed)
We have never received any CT scan reports from [facility]

## 2011-11-18 NOTE — Telephone Encounter (Signed)
Pt aware that we do not have the report and that I will forward the message to [name] to obtain CT reports from May from [facility]. Carron Curie, CMA

## 2011-11-18 NOTE — Telephone Encounter (Signed)
Spoke with pt. She states that [facility] was supposed to have faxed a copy of ct chest that she had done in May where it was noted that there was a questionable "spot" on her lung. Please advise if you ever received this thanks!

## 2011-11-19 NOTE — Telephone Encounter (Signed)
i will review next week 

## 2011-11-19 NOTE — Telephone Encounter (Signed)
CT Chest abd pelvis received and placed in Dr. Lynelle Doctor to do folder.  Will forward msg to him to address.

## 2011-11-19 NOTE — Telephone Encounter (Signed)
[facility], spoke with Larita Fife who states I will need to fax over a cover sheet requesting this to [phone].  I have done this and asked this be faxed to triage.  Will hold msg in my box to look out for fax.

## 2011-11-22 ENCOUNTER — Telehealth: Payer: Self-pay | Admitting: Critical Care Medicine

## 2011-11-22 DIAGNOSIS — R918 Other nonspecific abnormal finding of lung field: Secondary | ICD-10-CM | POA: Insufficient documentation

## 2011-11-22 NOTE — Telephone Encounter (Signed)
I called the pt and told her the CT Chest was stable  She will need f/u CT in 6months

## 2011-12-13 ENCOUNTER — Other Ambulatory Visit (HOSPITAL_COMMUNITY): Payer: Self-pay | Admitting: Internal Medicine

## 2011-12-13 DIAGNOSIS — Z1231 Encounter for screening mammogram for malignant neoplasm of breast: Secondary | ICD-10-CM

## 2011-12-21 DIAGNOSIS — M1710 Unilateral primary osteoarthritis, unspecified knee: Secondary | ICD-10-CM | POA: Diagnosis not present

## 2011-12-29 ENCOUNTER — Ambulatory Visit (HOSPITAL_COMMUNITY)
Admission: RE | Admit: 2011-12-29 | Discharge: 2011-12-29 | Disposition: A | Discharge status: Home / Self Care | Payer: Medicare Other | Source: Ambulatory Visit | Attending: Internal Medicine | Admitting: Internal Medicine

## 2011-12-29 DIAGNOSIS — Z1231 Encounter for screening mammogram for malignant neoplasm of breast: Secondary | ICD-10-CM | POA: Insufficient documentation

## 2012-02-24 DIAGNOSIS — E559 Vitamin D deficiency, unspecified: Secondary | ICD-10-CM | POA: Diagnosis not present

## 2012-02-24 DIAGNOSIS — I10 Essential (primary) hypertension: Secondary | ICD-10-CM | POA: Diagnosis not present

## 2012-03-01 DIAGNOSIS — Z23 Encounter for immunization: Secondary | ICD-10-CM | POA: Diagnosis not present

## 2012-03-01 DIAGNOSIS — E7800 Pure hypercholesterolemia, unspecified: Secondary | ICD-10-CM | POA: Diagnosis not present

## 2012-03-01 DIAGNOSIS — E039 Hypothyroidism, unspecified: Secondary | ICD-10-CM | POA: Diagnosis not present

## 2012-03-01 DIAGNOSIS — M810 Age-related osteoporosis without current pathological fracture: Secondary | ICD-10-CM | POA: Diagnosis not present

## 2012-03-28 ENCOUNTER — Ambulatory Visit: Payer: Medicare Other | Admitting: Critical Care Medicine

## 2012-04-06 DIAGNOSIS — R911 Solitary pulmonary nodule: Secondary | ICD-10-CM | POA: Diagnosis not present

## 2012-04-06 DIAGNOSIS — I7789 Other specified disorders of arteries and arterioles: Secondary | ICD-10-CM | POA: Diagnosis not present

## 2012-04-06 DIAGNOSIS — J13 Pneumonia due to Streptococcus pneumoniae: Secondary | ICD-10-CM | POA: Diagnosis not present

## 2012-04-06 DIAGNOSIS — J9819 Other pulmonary collapse: Secondary | ICD-10-CM | POA: Diagnosis not present

## 2012-04-06 DIAGNOSIS — R918 Other nonspecific abnormal finding of lung field: Secondary | ICD-10-CM | POA: Diagnosis not present

## 2012-05-26 ENCOUNTER — Telehealth: Payer: Self-pay | Admitting: Critical Care Medicine

## 2012-05-26 DIAGNOSIS — R911 Solitary pulmonary nodule: Secondary | ICD-10-CM

## 2012-05-26 NOTE — Telephone Encounter (Signed)
Call pt, she  Needs to get a CT Chest for lung nodule f/u

## 2012-05-26 NOTE — Telephone Encounter (Signed)
Pt called back and is aware of chest ct 05/31/12@[facility]  Tobe Sos

## 2012-05-26 NOTE — Telephone Encounter (Signed)
Chest ct scheduled 05/31/12@1 ;30pm lmtcb

## 2012-05-30 ENCOUNTER — Ambulatory Visit (INDEPENDENT_AMBULATORY_CARE_PROVIDER_SITE_OTHER)
Admission: RE | Admit: 2012-05-30 | Discharge: 2012-05-30 | Disposition: A | Discharge status: Home / Self Care | Payer: Medicare Other | Source: Ambulatory Visit | Attending: Critical Care Medicine | Admitting: Critical Care Medicine

## 2012-05-30 DIAGNOSIS — R911 Solitary pulmonary nodule: Secondary | ICD-10-CM

## 2012-05-31 ENCOUNTER — Other Ambulatory Visit: Payer: Medicare Other

## 2012-05-31 ENCOUNTER — Telehealth: Payer: Self-pay | Admitting: Critical Care Medicine

## 2012-05-31 DIAGNOSIS — M5380 Other specified dorsopathies, site unspecified: Secondary | ICD-10-CM | POA: Diagnosis not present

## 2012-05-31 DIAGNOSIS — Z0000 Encounter for general adult medical examination without abnormal findings: Secondary | ICD-10-CM | POA: Diagnosis not present

## 2012-05-31 DIAGNOSIS — R413 Other amnesia: Secondary | ICD-10-CM | POA: Diagnosis not present

## 2012-05-31 DIAGNOSIS — Z1331 Encounter for screening for depression: Secondary | ICD-10-CM | POA: Diagnosis not present

## 2012-05-31 DIAGNOSIS — R918 Other nonspecific abnormal finding of lung field: Secondary | ICD-10-CM

## 2012-05-31 NOTE — Telephone Encounter (Signed)
I tried to call the pt re CT Chest result. I left msg for her to call back  Tell her: No change in RUL 3mm nodule No nodule seen in LUL  No need for further CTs   Nodules are benign

## 2012-06-01 NOTE — Telephone Encounter (Signed)
Called, spoke with pt.  Informed her of below CT Chest results and recs per Dr. Delford Field.  She verbalized understanding of this.  Pt was last seen by PW in July 2013 and asked to f/u in 4 months.  She states she will call back either tomorrow or next week to schedule a routine f/u with him and voiced no further questions or concerns at this time.

## 2012-06-01 NOTE — Telephone Encounter (Signed)
Pt returned [name]'s call & will be home for a couple of hours.  Sarah Johnson

## 2012-06-01 NOTE — Telephone Encounter (Signed)
lmomtcb on pt's home and cell #s 

## 2012-06-07 ENCOUNTER — Other Ambulatory Visit: Payer: Self-pay | Admitting: Cardiology

## 2012-06-12 ENCOUNTER — Other Ambulatory Visit: Payer: Self-pay | Admitting: Cardiology

## 2012-08-08 ENCOUNTER — Other Ambulatory Visit: Payer: Self-pay | Admitting: Cardiology

## 2012-08-23 DIAGNOSIS — M810 Age-related osteoporosis without current pathological fracture: Secondary | ICD-10-CM | POA: Diagnosis not present

## 2012-08-23 DIAGNOSIS — E7800 Pure hypercholesterolemia, unspecified: Secondary | ICD-10-CM | POA: Diagnosis not present

## 2012-08-23 DIAGNOSIS — E039 Hypothyroidism, unspecified: Secondary | ICD-10-CM | POA: Diagnosis not present

## 2012-08-29 DIAGNOSIS — R413 Other amnesia: Secondary | ICD-10-CM | POA: Diagnosis not present

## 2012-08-29 DIAGNOSIS — E039 Hypothyroidism, unspecified: Secondary | ICD-10-CM | POA: Diagnosis not present

## 2012-08-29 DIAGNOSIS — M810 Age-related osteoporosis without current pathological fracture: Secondary | ICD-10-CM | POA: Diagnosis not present

## 2012-08-29 DIAGNOSIS — E559 Vitamin D deficiency, unspecified: Secondary | ICD-10-CM | POA: Diagnosis not present

## 2012-11-13 ENCOUNTER — Other Ambulatory Visit: Payer: Self-pay | Admitting: Internal Medicine

## 2012-11-13 DIAGNOSIS — R4182 Altered mental status, unspecified: Secondary | ICD-10-CM | POA: Diagnosis not present

## 2012-11-16 ENCOUNTER — Ambulatory Visit
Admission: RE | Admit: 2012-11-16 | Discharge: 2012-11-16 | Disposition: A | Discharge status: Home / Self Care | Payer: Medicare Other | Source: Ambulatory Visit | Attending: Internal Medicine | Admitting: Internal Medicine

## 2012-11-16 DIAGNOSIS — R413 Other amnesia: Secondary | ICD-10-CM | POA: Diagnosis not present

## 2012-11-16 DIAGNOSIS — R4182 Altered mental status, unspecified: Secondary | ICD-10-CM

## 2012-12-28 DIAGNOSIS — I10 Essential (primary) hypertension: Secondary | ICD-10-CM | POA: Diagnosis not present

## 2012-12-28 DIAGNOSIS — E039 Hypothyroidism, unspecified: Secondary | ICD-10-CM | POA: Diagnosis not present

## 2013-01-02 DIAGNOSIS — M810 Age-related osteoporosis without current pathological fracture: Secondary | ICD-10-CM | POA: Diagnosis not present

## 2013-01-02 DIAGNOSIS — E559 Vitamin D deficiency, unspecified: Secondary | ICD-10-CM | POA: Diagnosis not present

## 2013-01-02 DIAGNOSIS — E785 Hyperlipidemia, unspecified: Secondary | ICD-10-CM | POA: Diagnosis not present

## 2013-01-02 DIAGNOSIS — J449 Chronic obstructive pulmonary disease, unspecified: Secondary | ICD-10-CM | POA: Diagnosis not present

## 2013-01-02 DIAGNOSIS — Z23 Encounter for immunization: Secondary | ICD-10-CM | POA: Diagnosis not present

## 2013-01-02 DIAGNOSIS — I10 Essential (primary) hypertension: Secondary | ICD-10-CM | POA: Diagnosis not present

## 2013-04-21 ENCOUNTER — Emergency Department (HOSPITAL_BASED_OUTPATIENT_CLINIC_OR_DEPARTMENT_OTHER): Payer: Medicare Other

## 2013-04-21 ENCOUNTER — Encounter (HOSPITAL_BASED_OUTPATIENT_CLINIC_OR_DEPARTMENT_OTHER): Payer: Self-pay | Admitting: Emergency Medicine

## 2013-04-21 ENCOUNTER — Inpatient Hospital Stay (HOSPITAL_BASED_OUTPATIENT_CLINIC_OR_DEPARTMENT_OTHER)
Admission: EM | Admit: 2013-04-21 | Discharge: 2013-04-26 | DRG: 872 | Disposition: A | Discharge status: Home Health | Payer: Medicare Other | Attending: Internal Medicine | Admitting: Internal Medicine

## 2013-04-21 DIAGNOSIS — R197 Diarrhea, unspecified: Secondary | ICD-10-CM

## 2013-04-21 DIAGNOSIS — K5792 Diverticulitis of intestine, part unspecified, without perforation or abscess without bleeding: Secondary | ICD-10-CM

## 2013-04-21 DIAGNOSIS — Z880 Allergy status to penicillin: Secondary | ICD-10-CM

## 2013-04-21 DIAGNOSIS — I359 Nonrheumatic aortic valve disorder, unspecified: Secondary | ICD-10-CM

## 2013-04-21 DIAGNOSIS — I719 Aortic aneurysm of unspecified site, without rupture: Secondary | ICD-10-CM | POA: Diagnosis present

## 2013-04-21 DIAGNOSIS — R112 Nausea with vomiting, unspecified: Secondary | ICD-10-CM | POA: Diagnosis not present

## 2013-04-21 DIAGNOSIS — Z886 Allergy status to analgesic agent status: Secondary | ICD-10-CM | POA: Diagnosis not present

## 2013-04-21 DIAGNOSIS — Z79899 Other long term (current) drug therapy: Secondary | ICD-10-CM

## 2013-04-21 DIAGNOSIS — R7881 Bacteremia: Secondary | ICD-10-CM | POA: Diagnosis not present

## 2013-04-21 DIAGNOSIS — E039 Hypothyroidism, unspecified: Secondary | ICD-10-CM | POA: Diagnosis present

## 2013-04-21 DIAGNOSIS — N179 Acute kidney failure, unspecified: Secondary | ICD-10-CM | POA: Diagnosis not present

## 2013-04-21 DIAGNOSIS — D72829 Elevated white blood cell count, unspecified: Secondary | ICD-10-CM | POA: Diagnosis not present

## 2013-04-21 DIAGNOSIS — A4151 Sepsis due to Escherichia coli [E. coli]: Secondary | ICD-10-CM | POA: Diagnosis present

## 2013-04-21 DIAGNOSIS — Z954 Presence of other heart-valve replacement: Secondary | ICD-10-CM | POA: Diagnosis not present

## 2013-04-21 DIAGNOSIS — M129 Arthropathy, unspecified: Secondary | ICD-10-CM | POA: Diagnosis present

## 2013-04-21 DIAGNOSIS — E860 Dehydration: Secondary | ICD-10-CM | POA: Diagnosis not present

## 2013-04-21 DIAGNOSIS — A419 Sepsis, unspecified organism: Secondary | ICD-10-CM | POA: Diagnosis present

## 2013-04-21 DIAGNOSIS — Z7982 Long term (current) use of aspirin: Secondary | ICD-10-CM

## 2013-04-21 DIAGNOSIS — Z87891 Personal history of nicotine dependence: Secondary | ICD-10-CM | POA: Diagnosis not present

## 2013-04-21 DIAGNOSIS — H5440 Blindness, one eye, unspecified eye: Secondary | ICD-10-CM | POA: Diagnosis present

## 2013-04-21 DIAGNOSIS — N189 Chronic kidney disease, unspecified: Secondary | ICD-10-CM | POA: Diagnosis not present

## 2013-04-21 DIAGNOSIS — Z808 Family history of malignant neoplasm of other organs or systems: Secondary | ICD-10-CM

## 2013-04-21 DIAGNOSIS — J438 Other emphysema: Secondary | ICD-10-CM | POA: Diagnosis present

## 2013-04-21 DIAGNOSIS — I129 Hypertensive chronic kidney disease with stage 1 through stage 4 chronic kidney disease, or unspecified chronic kidney disease: Secondary | ICD-10-CM | POA: Diagnosis present

## 2013-04-21 DIAGNOSIS — K219 Gastro-esophageal reflux disease without esophagitis: Secondary | ICD-10-CM | POA: Diagnosis present

## 2013-04-21 DIAGNOSIS — Z86711 Personal history of pulmonary embolism: Secondary | ICD-10-CM | POA: Diagnosis not present

## 2013-04-21 DIAGNOSIS — K5732 Diverticulitis of large intestine without perforation or abscess without bleeding: Secondary | ICD-10-CM

## 2013-04-21 DIAGNOSIS — J449 Chronic obstructive pulmonary disease, unspecified: Secondary | ICD-10-CM

## 2013-04-21 DIAGNOSIS — J984 Other disorders of lung: Secondary | ICD-10-CM | POA: Diagnosis not present

## 2013-04-21 DIAGNOSIS — Z952 Presence of prosthetic heart valve: Secondary | ICD-10-CM | POA: Diagnosis not present

## 2013-04-21 DIAGNOSIS — E785 Hyperlipidemia, unspecified: Secondary | ICD-10-CM

## 2013-04-21 DIAGNOSIS — E782 Mixed hyperlipidemia: Secondary | ICD-10-CM | POA: Diagnosis present

## 2013-04-21 DIAGNOSIS — K8020 Calculus of gallbladder without cholecystitis without obstruction: Secondary | ICD-10-CM | POA: Diagnosis not present

## 2013-04-21 DIAGNOSIS — I10 Essential (primary) hypertension: Secondary | ICD-10-CM

## 2013-04-21 DIAGNOSIS — J4489 Other specified chronic obstructive pulmonary disease: Secondary | ICD-10-CM

## 2013-04-21 LAB — CBC WITH DIFFERENTIAL/PLATELET
Basophils Absolute: 0 10*3/uL (ref 0.0–0.1)
Eosinophils Absolute: 0 10*3/uL (ref 0.0–0.7)
Eosinophils Relative: 0 % (ref 0–5)
Lymphocytes Relative: 3 % — ABNORMAL LOW (ref 12–46)
Lymphs Abs: 0.9 10*3/uL (ref 0.7–4.0)
MCV: 87.6 fL (ref 78.0–100.0)
Neutro Abs: 25.3 10*3/uL — ABNORMAL HIGH (ref 1.7–7.7)
Neutrophils Relative %: 92 % — ABNORMAL HIGH (ref 43–77)
Platelets: 189 10*3/uL (ref 150–400)
RBC: 3.96 MIL/uL (ref 3.87–5.11)
RDW: 15.5 % (ref 11.5–15.5)
WBC: 27.6 10*3/uL — ABNORMAL HIGH (ref 4.0–10.5)

## 2013-04-21 LAB — URINALYSIS, ROUTINE W REFLEX MICROSCOPIC
Glucose, UA: NEGATIVE mg/dL
Hgb urine dipstick: NEGATIVE
Ketones, ur: NEGATIVE mg/dL
Leukocytes, UA: NEGATIVE
Protein, ur: 30 mg/dL — AB
pH: 5.5 (ref 5.0–8.0)

## 2013-04-21 LAB — COMPREHENSIVE METABOLIC PANEL
ALT: 15 U/L (ref 0–35)
AST: 34 U/L (ref 0–37)
Albumin: 3.1 g/dL — ABNORMAL LOW (ref 3.5–5.2)
Alkaline Phosphatase: 90 U/L (ref 39–117)
CO2: 23 mEq/L (ref 19–32)
Calcium: 9.5 mg/dL (ref 8.4–10.5)
Chloride: 96 mEq/L (ref 96–112)
GFR calc non Af Amer: 30 mL/min — ABNORMAL LOW (ref >90)
Potassium: 4.3 mEq/L (ref 3.5–5.1)
Sodium: 134 mEq/L — ABNORMAL LOW (ref 135–145)
Total Bilirubin: 0.7 mg/dL (ref 0.3–1.2)

## 2013-04-21 LAB — CG4 I-STAT (LACTIC ACID): Lactic Acid, Venous: 1.72 mmol/L (ref 0.5–2.2)

## 2013-04-21 LAB — URINE MICROSCOPIC-ADD ON

## 2013-04-21 MED ORDER — SODIUM CHLORIDE 0.9 % IV SOLN
INTRAVENOUS | Status: AC
  Administered 2013-04-21: 22:00:00 via INTRAVENOUS

## 2013-04-21 MED ORDER — SODIUM CHLORIDE 0.9 % IV BOLUS (SEPSIS)
1000.0000 mL | Freq: Once | INTRAVENOUS | Status: AC
  Administered 2013-04-21: 1000 mL via INTRAVENOUS

## 2013-04-21 MED ORDER — CIPROFLOXACIN IN D5W 400 MG/200ML IV SOLN
400.0000 mg | Freq: Two times a day (BID) | INTRAVENOUS | Status: DC
  Administered 2013-04-22 (×2): 400 mg via INTRAVENOUS
  Filled 2013-04-21 (×3): qty 200

## 2013-04-21 MED ORDER — ACETAMINOPHEN 325 MG PO TABS
650.0000 mg | ORAL_TABLET | Freq: Once | ORAL | Status: AC
  Administered 2013-04-21: 650 mg via ORAL
  Filled 2013-04-21: qty 2

## 2013-04-21 MED ORDER — METOPROLOL TARTRATE 50 MG PO TABS
50.0000 mg | ORAL_TABLET | Freq: Two times a day (BID) | ORAL | Status: DC
  Administered 2013-04-22 – 2013-04-26 (×8): 50 mg via ORAL
  Filled 2013-04-21 (×11): qty 1

## 2013-04-21 MED ORDER — LEVOTHYROXINE SODIUM 125 MCG PO TABS
125.0000 ug | ORAL_TABLET | Freq: Every day | ORAL | Status: DC
  Administered 2013-04-22 – 2013-04-26 (×5): 125 ug via ORAL
  Filled 2013-04-21 (×7): qty 1

## 2013-04-21 MED ORDER — PANTOPRAZOLE SODIUM 40 MG PO TBEC
40.0000 mg | DELAYED_RELEASE_TABLET | Freq: Every day | ORAL | Status: DC
  Administered 2013-04-22 – 2013-04-26 (×5): 40 mg via ORAL
  Filled 2013-04-21 (×5): qty 1

## 2013-04-21 MED ORDER — ASPIRIN EC 81 MG PO TBEC
81.0000 mg | DELAYED_RELEASE_TABLET | Freq: Every day | ORAL | Status: DC
  Administered 2013-04-22 – 2013-04-26 (×5): 81 mg via ORAL
  Filled 2013-04-21 (×5): qty 1

## 2013-04-21 MED ORDER — HYDROMORPHONE HCL PF 1 MG/ML IJ SOLN: 0.5000 mg | INTRAMUSCULAR | Status: DC | PRN

## 2013-04-21 MED ORDER — ONDANSETRON HCL 4 MG PO TABS: 4.0000 mg | ORAL_TABLET | Freq: Four times a day (QID) | ORAL | Status: DC | PRN

## 2013-04-21 MED ORDER — ZINC OXIDE 11.3 % EX CREA
TOPICAL_CREAM | Freq: Two times a day (BID) | CUTANEOUS | Status: DC
  Administered 2013-04-21 – 2013-04-22 (×2): 1 via TOPICAL
  Administered 2013-04-22 – 2013-04-24 (×6): via TOPICAL
  Administered 2013-04-25: 1 via TOPICAL
  Administered 2013-04-25 – 2013-04-26 (×2): via TOPICAL
  Filled 2013-04-21 (×3): qty 56

## 2013-04-21 MED ORDER — KCL IN DEXTROSE-NACL 40-5-0.9 MEQ/L-%-% IV SOLN
INTRAVENOUS | Status: DC
  Administered 2013-04-22 – 2013-04-25 (×7): via INTRAVENOUS
  Filled 2013-04-21 (×9): qty 1000

## 2013-04-21 MED ORDER — ZINC OXIDE 11.3 % EX CREA
TOPICAL_CREAM | CUTANEOUS | Status: AC
  Administered 2013-04-21: 1 via TOPICAL
  Filled 2013-04-21: qty 56

## 2013-04-21 MED ORDER — ERGOCALCIFEROL 1.25 MG (50000 UT) PO CAPS
50000.0000 [IU] | ORAL_CAPSULE | ORAL | Status: DC
  Administered 2013-04-22: 10:00:00 50000 [IU] via ORAL
  Filled 2013-04-21: qty 1

## 2013-04-21 MED ORDER — IOHEXOL 300 MG/ML  SOLN
75.0000 mL | Freq: Once | INTRAMUSCULAR | Status: AC | PRN
  Administered 2013-04-21: 75 mL via INTRAVENOUS

## 2013-04-21 MED ORDER — ONDANSETRON HCL 4 MG/2ML IJ SOLN
4.0000 mg | Freq: Once | INTRAMUSCULAR | Status: AC
  Administered 2013-04-21: 4 mg via INTRAVENOUS
  Filled 2013-04-21: qty 2

## 2013-04-21 MED ORDER — AMLODIPINE BESYLATE 5 MG PO TABS
5.0000 mg | ORAL_TABLET | Freq: Every day | ORAL | Status: DC
  Administered 2013-04-22 – 2013-04-26 (×5): 5 mg via ORAL
  Filled 2013-04-21 (×5): qty 1

## 2013-04-21 MED ORDER — HEPARIN SODIUM (PORCINE) 5000 UNIT/ML IJ SOLN
5000.0000 [IU] | Freq: Three times a day (TID) | INTRAMUSCULAR | Status: DC
  Administered 2013-04-22 – 2013-04-26 (×14): 5000 [IU] via SUBCUTANEOUS
  Filled 2013-04-21 (×17): qty 1

## 2013-04-21 MED ORDER — LEVOFLOXACIN IN D5W 750 MG/150ML IV SOLN
750.0000 mg | Freq: Once | INTRAVENOUS | Status: AC
  Administered 2013-04-21: 750 mg via INTRAVENOUS
  Filled 2013-04-21: qty 150

## 2013-04-21 MED ORDER — LOSARTAN POTASSIUM 50 MG PO TABS
100.0000 mg | ORAL_TABLET | Freq: Every day | ORAL | Status: DC
  Administered 2013-04-22 – 2013-04-26 (×5): 100 mg via ORAL
  Filled 2013-04-21 (×5): qty 2

## 2013-04-21 MED ORDER — ALBUTEROL SULFATE (5 MG/ML) 0.5% IN NEBU
5.0000 mg | INHALATION_SOLUTION | Freq: Once | RESPIRATORY_TRACT | Status: AC
  Administered 2013-04-21: 5 mg via RESPIRATORY_TRACT
  Filled 2013-04-21: qty 1

## 2013-04-21 MED ORDER — MOMETASONE FURO-FORMOTEROL FUM 100-5 MCG/ACT IN AERO
2.0000 | INHALATION_SPRAY | Freq: Two times a day (BID) | RESPIRATORY_TRACT | Status: DC
  Filled 2013-04-21: qty 8.8

## 2013-04-21 MED ORDER — ONDANSETRON HCL 4 MG/2ML IJ SOLN: 4.0000 mg | Freq: Three times a day (TID) | INTRAMUSCULAR | Status: DC | PRN

## 2013-04-21 MED ORDER — ADULT MULTIVITAMIN W/MINERALS CH
1.0000 | ORAL_TABLET | Freq: Every day | ORAL | Status: DC
  Administered 2013-04-22 – 2013-04-26 (×5): 1 via ORAL
  Filled 2013-04-21 (×5): qty 1

## 2013-04-21 MED ORDER — SODIUM CHLORIDE 0.9 % IV SOLN
Freq: Once | INTRAVENOUS | Status: AC
  Administered 2013-04-21: 17:00:00 via INTRAVENOUS

## 2013-04-21 MED ORDER — ONDANSETRON HCL 4 MG/2ML IJ SOLN: 4.0000 mg | Freq: Four times a day (QID) | INTRAMUSCULAR | Status: DC | PRN

## 2013-04-21 MED ORDER — MOMETASONE FURO-FORMOTEROL FUM 100-5 MCG/ACT IN AERO
2.0000 | INHALATION_SPRAY | Freq: Two times a day (BID) | RESPIRATORY_TRACT | Status: DC
  Administered 2013-04-22 – 2013-04-26 (×8): 2 via RESPIRATORY_TRACT
  Filled 2013-04-21: qty 8.8

## 2013-04-21 MED ORDER — METRONIDAZOLE IN NACL 5-0.79 MG/ML-% IV SOLN
500.0000 mg | Freq: Three times a day (TID) | INTRAVENOUS | Status: DC
  Administered 2013-04-22 – 2013-04-24 (×8): 500 mg via INTRAVENOUS
  Filled 2013-04-21 (×9): qty 100

## 2013-04-21 MED ORDER — METRONIDAZOLE IN NACL 5-0.79 MG/ML-% IV SOLN
500.0000 mg | Freq: Once | INTRAVENOUS | Status: AC
  Administered 2013-04-21: 500 mg via INTRAVENOUS
  Filled 2013-04-21: qty 100

## 2013-04-21 MED ORDER — IPRATROPIUM BROMIDE 0.02 % IN SOLN
0.5000 mg | Freq: Once | RESPIRATORY_TRACT | Status: AC
  Administered 2013-04-21: 0.5 mg via RESPIRATORY_TRACT
  Filled 2013-04-21: qty 2.5

## 2013-04-21 MED ORDER — ATORVASTATIN CALCIUM 20 MG PO TABS
20.0000 mg | ORAL_TABLET | Freq: Every day | ORAL | Status: DC
  Administered 2013-04-22 – 2013-04-25 (×4): 20 mg via ORAL
  Filled 2013-04-21 (×5): qty 1

## 2013-04-21 NOTE — ED Notes (Signed)
Fever, N/D for two days.

## 2013-04-21 NOTE — ED Notes (Signed)
Patient transported to CT 

## 2013-04-21 NOTE — H&P (Signed)
[facility] History and Physical  SABRINA KEOUGH WUJ:[id number] DOB: Jan 27, 1933    PCP:   Juline Patch, MD   Chief Complaint: nausea, vomiting and diarrhea with fever.  HPI: Sarah Johnson is an 79 y.o. female with hx of COPD, PE previously on coumadin, s/p bioprosthetic AVR, hypothyroidism, CKD, hyperlipidemia, SVT, presented to [facility] with 3 days hx of nausea, vomiting and diarrhea.  She has no abdominal pain.  She denied sorethroat, coughs, or myalgia.  She was having fever up to 103, and was given tylenol.  She hadn't been on antibiotics or having distant travel or ill contacts.  Evaluation in the ER included a marked leukocytosis with WBC of 27K, normal LFTs and Cr of 1.6.  An abdominal pelvic CT showed acute non-perforated diverticulitis and multiple diverticuli, with no abcess.  Her last colonoscopy was about 2 year ago with no evidence of malignancy.  Hosptialist was asked to admit her for diverticulitis.  Rewiew of Systems:  Constitutional: No significant weight loss or weight gain Eyes: Negative for eye pain, redness and discharge, diplopia, visual changes, or flashes of light. ENMT: Negative for ear pain, hoarseness, nasal congestion, sinus pressure and sore throat. No headaches; tinnitus, drooling, or problem swallowing. Cardiovascular: Negative for chest pain, palpitations, diaphoresis, dyspnea and peripheral edema. ; No orthopnea, PND Respiratory: Negative for cough, hemoptysis, wheezing and stridor. No pleuritic chestpain. Gastrointestinal: Negative  constipation, abdominal pain, melena, blood in stool, hematemesis, jaundice and rectal bleeding.    Genitourinary: Negative for frequency, dysuria, incontinence,flank pain and hematuria; Musculoskeletal: Negative for back pain and neck pain. Negative for swelling and trauma.;  Skin: . Negative for pruritus, rash, abrasions, bruising and skin lesion.; ulcerations Neuro: Negative for headache, lightheadedness and neck  stiffness. Negative for weakness, altered level of consciousness , altered mental status, extremity weakness, burning feet, involuntary movement, seizure and syncope.  Psych: negative for anxiety, depression, insomnia, tearfulness, panic attacks, hallucinations, paranoia, suicidal or homicidal ideation    Past Medical History  Diagnosis Date  . Emphysema     followed by Dr. Delford Field  . S/P thoracic aortic aneurysm repair 1/10    also with bioprosthetic aortic valve prelacement [facility]). in 10/10 she has a descending thoraic aorta anuerysm and abdominal reapir [facility]). Vascular urgeon is Dr. Ty Hilts.   . Pulmonary embolism 2010    She is no onger taking coumadin  . Hypothyroidism   . Blind right eye   . Hyperlipidemia   . S/P aortic valve replacement with bioprosthetic valve     ehco (8/11) with EF 55-60%, mild-mod MR, mild-mod TR, poorly visualized bioprosthetic aortic valve but no significant regurgitation and gradient not significanly elevated.    . CKD (chronic kidney disease)   . HTN (hypertension)   . History of Doppler echocardiogram     carotid dopplers. (8/11) no significant stenosis  . COPD (chronic obstructive pulmonary disease)   . SVT (supraventricular tachycardia)     HOLTER MONITOR AFTER SURGERY   . GERD (gastroesophageal reflux disease)   . Arthritis   . Cataract     RT    Past Surgical History  Procedure Laterality Date  . Thoracoabdominal aortic aneurysm repair  01/28/09    repair  . Anomalous pulmonary venous return repair    . Thoracic aortic aneurysm repair  1/10    VALVE ALSO DONE  . Back surgery  1970   . Eye surgery      AS BABY- RT EYE  POOR SIGHT  . Tonsillectomy    .  Lumbar laminectomy/decompression microdiscectomy  05/31/2011    Procedure: LUMBAR LAMINECTOMY/DECOMPRESSION MICRODISCECTOMY;  Surgeon: Mariam Dollar, MD;  Location: [facility];  Service: Neurosurgery;  Laterality: Bilateral;  Bilateral Lumbar three-four decompressionj lumbar  laminectomy     Medications:  HOME MEDS: Prior to Admission medications   Medication Sig Start Date End Date Taking? Authorizing Provider  aspirin EC 81 MG tablet Take 1 tablet (81 mg total) by mouth daily. 05/04/11  Yes Laurey Morale, MD  atorvastatin (LIPITOR) 20 MG tablet TAKE 1 TABLET EVERY DAY 06/12/12  Yes Laurey Morale, MD  atorvastatin (LIPITOR) 20 MG tablet TAKE 1 TABLET BY MOUTH EVERY DAY 08/08/12  Yes Laurey Morale, MD  furosemide (LASIX) 20 MG tablet Take as directed 11/15/11  Yes Historical Provider, MD  hydrocodone-acetaminophen (LORCET-HD) 5-500 MG per capsule Take 1 capsule by mouth every 6 (six) hours as needed.   Yes Historical Provider, MD  ibandronate (BONIVA) 150 MG tablet Take 150 mg by mouth every 30 (thirty) days. Take in the morning with a full glass of water, on an empty stomach, and do not take anything else by mouth or lie down for the next 30 min.   Yes Historical Provider, MD  losartan (COZAAR) 100 MG tablet Take 100 mg by mouth daily.     Yes Historical Provider, MD  metoprolol (LOPRESSOR) 50 MG tablet Take 50 mg by mouth 2 (two) times daily.     Yes Historical Provider, MD  Multiple Vitamin (MULITIVITAMIN WITH MINERALS) TABS Take 1 tablet by mouth daily.   Yes Historical Provider, MD  omeprazole (PRILOSEC) 20 MG capsule Take 20 mg by mouth daily.    Yes Historical Provider, MD  SYNTHROID 125 MCG tablet Take 125 mcg by mouth Daily.  02/23/11  Yes Historical Provider, MD  amLODipine (NORVASC) 5 MG tablet Take 5 mg by mouth daily.      Historical Provider, MD  Calcium Carbonate-Vitamin D (CALCIUM-VITAMIN D3) 600-200 MG-UNIT TABS Take by mouth daily.      Historical Provider, MD  ergocalciferol (VITAMIN D2) 50000 UNITS capsule Take 50,000 Units by mouth every 30 (thirty) days.      Historical Provider, MD  mometasone-formoterol (DULERA) 100-5 MCG/ACT AERO Inhale 2 puffs into the lungs 2 (two) times daily. Rinse mouth after each use 09/06/11   Storm Frisk, MD      Allergies:  Allergies  Allergen Reactions  . Codeine   . Penicillins     Social History:   reports that she quit smoking about 8 years ago. Her smoking use included Cigarettes. She has a 20 pack-year smoking history. She has never used smokeless tobacco. She reports that she does not drink alcohol or use illicit drugs.  Family History: Family History  Problem Relation Age of Onset  . Brain cancer Maternal Grandmother   . Coronary artery disease      no premature CAD     Physical Exam: Filed Vitals:   04/21/13 2007 04/21/13 2052 04/21/13 2103 04/21/13 2154  BP: 108/49 100/44 108/68 103/58  Pulse: 70 67 87 69  Temp: 99.4 F (37.4 C)   98.2 F (36.8 C)  TempSrc: Oral   Oral  Resp: 24 28 24 20   Height:    5\' 2"  (1.575 m)  Weight:    55.1 kg (121 lb 7.6 oz)  SpO2: 97% 97%  93%   Blood pressure 103/58, pulse 69, temperature 98.2 F (36.8 C), temperature source Oral, resp. rate 20, height 5\' 2"  (1.575  m), weight 55.1 kg (121 lb 7.6 oz), SpO2 93.00%.  GEN:  Pleasant  patient lying in the stretcher in no acute distress; cooperative with exam. PSYCH:  alert and oriented x4; does not appear anxious or depressed; affect is appropriate. HEENT: Mucous membranes pink and anicteric; PERRLA; EOM intact; no cervical lymphadenopathy nor thyromegaly or carotid bruit; no JVD; There were no stridor. Neck is very supple. Breasts:: Not examined CHEST WALL: No tenderness CHEST: Normal respiration, clear to auscultation bilaterally.  HEART: Regular rate and rhythm.  There are no murmur, rub, or gallops.   BACK: No kyphosis or scoliosis; no CVA tenderness ABDOMEN: soft and slightly tender LLQ.  no masses, no organomegaly, normal abdominal bowel sounds; no pannus; no intertriginous candida. There is no rebound and no distention. Rectal Exam: Not done EXTREMITIES: No bone or joint deformity; age-appropriate arthropathy of the hands and knees; no edema; no ulcerations.  There is no calf  tenderness. Genitalia: not examined PULSES: 2+ and symmetric SKIN: Normal hydration no rash or ulceration CNS: Cranial nerves 2-12 grossly intact no focal lateralizing neurologic deficit.  Speech is fluent; uvula elevated with phonation, facial symmetry and tongue midline. DTR are normal bilaterally, cerebella exam is intact, barbinski is negative and strengths are equaled bilaterally.  No sensory loss.   Labs on Admission:  Basic Metabolic Panel:  Recent Labs Lab 04/21/13 1420  NA 134*  K 4.3  CL 96  CO2 23  GLUCOSE 111*  BUN 22  CREATININE 1.60*  CALCIUM 9.5   Liver Function Tests:  Recent Labs Lab 04/21/13 1420  AST 34  ALT 15  ALKPHOS 90  BILITOT 0.7  PROT 7.3  ALBUMIN 3.1*    Recent Labs Lab 04/21/13 1420  LIPASE 24   No results found for this basename: AMMONIA,  in the last 168 hours CBC:  Recent Labs Lab 04/21/13 1420  WBC 27.6*  NEUTROABS 25.3*  HGB 11.4*  HCT 34.7*  MCV 87.6  PLT 189   Cardiac Enzymes: No results found for this basename: CKTOTAL, CKMB, CKMBINDEX, TROPONINI,  in the last 168 hours  CBG: No results found for this basename: GLUCAP,  in the last 168 hours   Radiological Exams on Admission: Dg Chest 2 View  04/21/2013   CLINICAL DATA:  Nausea, vomiting, diarrhea, low grade fever.  EXAM: CHEST  2 VIEW  COMPARISON:  06/04/2011  FINDINGS: Aortic knob remains prominent. Postoperative changes from sternotomy and aortic aneurysm repair. Central pulmonary vasculature remains prominent.  10 mm ill-defined ovoid opacity in the lateral left midlung zone is nonspecific. Scarring at the lung bases is unchanged. No pneumothorax or pleural effusion.  IMPRESSION: Chronic and postoperative changes.  New left midlung opacity. This may represent focal airspace disease. FOLLOW UP studies until resolution are recommended.   Electronically Signed   By: Maryclare Bean M.D.   On: 04/21/2013 17:28   Ct Abdomen Pelvis W Contrast  04/21/2013   CLINICAL DATA:   Leukocytosis with [place] blood count of [address]. Fever. Prior history of diverticulitis. Prior splenectomy during abdominal aortic aneurysm repair.  EXAM: CT ABDOMEN AND PELVIS WITH CONTRAST  TECHNIQUE: Multidetector CT imaging of the abdomen and pelvis was performed using the standard protocol following bolus administration of intravenous contrast.  CONTRAST:  75mL OMNIPAQUE IOHEXOL 300 MG/ML IV. Oral contrast was also administered. (Reduced intravenous contrast dose per Dr. Chilton Si due to the patient's creatinine of 1.6 and estimated GFR approximately 34.)  COMPARISON:  None.  FINDINGS: Edema/inflammation surrounding the distal sigmoid  colon at the rectosigmoid junction in the low pelvis associated with a several cm segment of wall thickening of the distal sigmoid colon. No extraluminal gas. No abnormal fluid collection. No ascites.  Scattered diverticula involving the distal descending colon. Remainder of the colon unremarkable. Lipoma involving the ileocecal valve. Normal appendix in the right mid pelvis, filled with oral contrast. Stomach and small bowel normal in appearance.  Normal appearing liver, adrenal glands, and left kidney. Spleen surgically absent. Calcification in the mid body of the otherwise normal-appearing pancreas. Approximate 1.7 cm partially calcified cholesterol gallstone within the otherwise normal-appearing gallbladder. No biliary ductal dilation. Approximate 3.5 x 3.0 cm simple cyst arising from the upper pole of the otherwise normal-appearing right kidney.  Prior thoracoabdominal aortic aneurysm repair with haziness in the fat surrounding the distal thoracic and upper abdominal aorta, consistent with scarring. Scarring in the left subphrenic region related to the prior splenectomy. Thinning and elevation of the left hemidiaphragm without evidence of diaphragmatic hernia. Native abdominal aortic and iliofemoral atherosclerosis without aneurysm.  Bone window images demonstrate generalized  osseous demineralization, degenerative disc disease at L4-5 and L5-S1, facet degenerative changes involving the lower lumbar spine including a degenerative grade 1 spondylolisthesis of L5 on S1 approximating 8 mm. Scar or atelectasis in the right middle lobe and left lower lobe. Heart enlarged.  IMPRESSION: 1. Acute diverticulitis involving the distal sigmoid colon at the rectosigmoid junction, low in the pelvis. No evidence of perforation or abscess. 2. Scattered diverticula in the descending colon without evidence of acute diverticulitis elsewhere. 3. Isolated calcification in the mid body of the otherwise normal-appearing pancreas, query prior episode of pancreatitis. 4. Cholelithiasis without CT evidence of acute cholecystitis. 5. Right femoral hernia containing fat.   Electronically Signed   By: Hulan Saas M.D.   On: 04/21/2013 18:28   Assessment/Plan Present on Admission:  . Diverticulitis . HYPERTENSION, UNSPECIFIED . EMPHYSEMA . CKD (chronic kidney disease) . AORTIC ANEURYSM . HYPERLIPIDEMIA-MIXED  PLAN: Will admit her for diverticulitis.  She is also asplenic.  Will give IV Flagyl and IV Cipro along with IVF.  I will give her clear liquid.  She also has CKD, and current Cr is 1.6, will continue to monitor.  I don't think she has C diff, but should obtain PCR.  Doubtful she has influenza.  She is stable, full code, and will be admitted to [facility] service.  Thank you for allowing me to participate in her care.  I updated plan with her 2 daughters at her bedside.  Other plans as per orders.  Code Status: FULL Unk Lightning, MD. [facility] Pager [phone] 7pm to 7am.  04/21/2013, 11:02 PM

## 2013-04-21 NOTE — ED Provider Notes (Signed)
Patient received in signout from Dr. Judd Lien.  Patient had mild drop in blood pressure initially with fever. However lactic acid is within normal limits. Search criteria are met with signs of infection from diverticulitis (no abscess or perforation). Patient may be an early sepsis. However with early IV fluid boluses, blood pressure has improved. Patient and family are updated. I spoke with Dr. Lovell Sheehan at [facility], and who agrees to admit the patient and accepts the patient in transfer. Due to the findings, IV Flagyl was added to the antibiotic regimen. Patient denies any current pain or nausea.  Sarah Johnson. Oletta Lamas, MD 04/21/13 1610

## 2013-04-21 NOTE — Progress Notes (Signed)
Transfer from [facility] ED to [facility] 79 year old female with N+V+D x 2 days, found to have Acute Diverticulitis on CT scan and given 1 dose of Levaquin IV and IV Metronidazole in ED.  Going to MED/Surg Bed at [facility].  Hx COPD,Hypothyroid, hyperlipidemia CKD......Marland Kitchen

## 2013-04-21 NOTE — ED Provider Notes (Signed)
CSN: [id number]     Arrival date & time 04/21/13  1210 History   First MD Initiated Contact with Patient 04/21/13 1336     Chief Complaint  Patient presents with  . Fever  . Nausea  . Diarrhea   (Consider location/radiation/quality/duration/timing/severity/associated sxs/prior Treatment) HPI Comments: Patient is a 79 year old female with history of COPD and aortic aneurysm repair. She presents today with complaints of nausea vomiting and diarrhea that started yesterday and has been persistent. All have been nonbloody. She's had low-grade fevers at home.  Patient is a 79 y.o. female presenting with diarrhea. The history is provided by the patient.  Diarrhea Quality:  Watery Severity:  Severe Onset quality:  Sudden Timing:  Constant Progression:  Worsening Relieved by:  Nothing Worsened by:  Nothing tried Ineffective treatments:  None tried Associated symptoms: chills and fever   Associated symptoms: no abdominal pain     Past Medical History  Diagnosis Date  . Emphysema     followed by Dr. Delford Field  . S/P thoracic aortic aneurysm repair 1/10    also with bioprosthetic aortic valve prelacement [facility]). in 10/10 she has a descending thoraic aorta anuerysm and abdominal reapir [facility]). Vascular urgeon is Dr. Ty Hilts.   . Pulmonary embolism 2010    She is no onger taking coumadin  . Hypothyroidism   . Blind right eye   . Hyperlipidemia   . S/P aortic valve replacement with bioprosthetic valve     ehco (8/11) with EF 55-60%, mild-mod MR, mild-mod TR, poorly visualized bioprosthetic aortic valve but no significant regurgitation and gradient not significanly elevated.    . CKD (chronic kidney disease)   . HTN (hypertension)   . History of Doppler echocardiogram     carotid dopplers. (8/11) no significant stenosis  . COPD (chronic obstructive pulmonary disease)   . SVT (supraventricular tachycardia)     HOLTER MONITOR AFTER SURGERY   . GERD (gastroesophageal reflux  disease)   . Arthritis   . Cataract     RT   Past Surgical History  Procedure Laterality Date  . Thoracoabdominal aortic aneurysm repair  01/28/09    repair  . Anomalous pulmonary venous return repair    . Thoracic aortic aneurysm repair  1/10    VALVE ALSO DONE  . Back surgery  1970   . Eye surgery      AS BABY- RT EYE  POOR SIGHT  . Tonsillectomy    . Lumbar laminectomy/decompression microdiscectomy  05/31/2011    Procedure: LUMBAR LAMINECTOMY/DECOMPRESSION MICRODISCECTOMY;  Surgeon: Mariam Dollar, MD;  Location: [facility];  Service: Neurosurgery;  Laterality: Bilateral;  Bilateral Lumbar three-four decompressionj lumbar laminectomy    Family History  Problem Relation Age of Onset  . Brain cancer Maternal Grandmother   . Coronary artery disease      no premature CAD   History  Substance Use Topics  . Smoking status: Former Smoker -- 1.00 packs/day for 20 years    Types: Cigarettes    Quit date: 04/26/2004  . Smokeless tobacco: Never Used     Comment: quit in 2006  . Alcohol Use: No   OB History   Grav Para Term Preterm Abortions TAB SAB Ect Mult Living                 Review of Systems  Constitutional: Positive for fever and chills.  Gastrointestinal: Positive for diarrhea. Negative for abdominal pain.  All other systems reviewed and are negative.  Allergies  Codeine and Penicillins  Home Medications   Current Outpatient Rx  Name  Route  Sig  Dispense  Refill  . aspirin EC 81 MG tablet   Oral   Take 1 tablet (81 mg total) by mouth daily.         Marland Kitchen atorvastatin (LIPITOR) 20 MG tablet      TAKE 1 TABLET EVERY DAY   30 tablet   5   . atorvastatin (LIPITOR) 20 MG tablet      TAKE 1 TABLET BY MOUTH EVERY DAY   30 tablet   1   . furosemide (LASIX) 20 MG tablet      Take as directed         . hydrocodone-acetaminophen (LORCET-HD) 5-500 MG per capsule   Oral   Take 1 capsule by mouth every 6 (six) hours as needed.         . ibandronate  (BONIVA) 150 MG tablet   Oral   Take 150 mg by mouth every 30 (thirty) days. Take in the morning with a full glass of water, on an empty stomach, and do not take anything else by mouth or lie down for the next 30 min.         Marland Kitchen losartan (COZAAR) 100 MG tablet   Oral   Take 100 mg by mouth daily.           . metoprolol (LOPRESSOR) 50 MG tablet   Oral   Take 50 mg by mouth 2 (two) times daily.           . Multiple Vitamin (MULITIVITAMIN WITH MINERALS) TABS   Oral   Take 1 tablet by mouth daily.         Marland Kitchen omeprazole (PRILOSEC) 20 MG capsule   Oral   Take 20 mg by mouth daily.          Marland Kitchen SYNTHROID 125 MCG tablet   Oral   Take 125 mcg by mouth Daily.          Marland Kitchen amLODipine (NORVASC) 5 MG tablet   Oral   Take 5 mg by mouth daily.           . Calcium Carbonate-Vitamin D (CALCIUM-VITAMIN D3) 600-200 MG-UNIT TABS   Oral   Take by mouth daily.           . ergocalciferol (VITAMIN D2) 50000 UNITS capsule   Oral   Take 50,000 Units by mouth every 30 (thirty) days.           . mometasone-formoterol (DULERA) 100-5 MCG/ACT AERO   Inhalation   Inhale 2 puffs into the lungs 2 (two) times daily. Rinse mouth after each use   1 Inhaler   11    BP 103/41  Pulse 91  Temp(Src) 98.2 F (36.8 C) (Oral)  Resp 20  Ht 5\' 2"  (1.575 m)  Wt 125 lb (56.7 kg)  BMI 22.86 kg/m2  SpO2 95% Physical Exam  Nursing note and vitals reviewed. Constitutional: She is oriented to person, place, and time. She appears well-developed and well-nourished. No distress.  Patient is a 79 year old female in no acute distress. She appears somewhat pale and uncomfortable.  HENT:  Head: Normocephalic and atraumatic.  Neck: Normal range of motion. Neck supple.  Cardiovascular: Normal rate and regular rhythm.  Exam reveals no gallop and no friction rub.   No murmur heard. Pulmonary/Chest: Effort normal and breath sounds normal. No respiratory distress. She has no wheezes.  Abdominal: Soft. Bowel  sounds are normal. She exhibits no distension. There is no tenderness.  Musculoskeletal: Normal range of motion.  Neurological: She is alert and oriented to person, place, and time.  Skin: Skin is warm and dry. She is not diaphoretic.    ED Course  Procedures (including critical care time) Labs Review Labs Reviewed  CBC WITH DIFFERENTIAL  COMPREHENSIVE METABOLIC PANEL  LIPASE, BLOOD   Imaging Review No results found.  EKG Interpretation   None       MDM  No diagnosis found. Patient is a 79 year old female with history of COPD and splenectomy who presents with diarrhea for the past 24 hours. She feels extremely weak. Workup reveals a markedly elevated white count 27.6 and she developed a fever to 1015 while in the ER. I am concerned about possible sepsis in this situation. The focus of her symptoms appears to be abdominal in nature, so a CT scan of the abdomen and pelvis will be obtained. She will be cultured and I have elected to administer Levaquin pending results of the CT scan. Care signed out to Dr. Oletta Lamas at shift change awaiting CT results.    Geoffery Lyons, MD 04/21/13 [phone]

## 2013-04-21 NOTE — ED Notes (Signed)
Family notified that patient leaving and room number via phone

## 2013-04-22 DIAGNOSIS — R197 Diarrhea, unspecified: Secondary | ICD-10-CM

## 2013-04-22 DIAGNOSIS — I10 Essential (primary) hypertension: Secondary | ICD-10-CM

## 2013-04-22 DIAGNOSIS — D72829 Elevated white blood cell count, unspecified: Secondary | ICD-10-CM

## 2013-04-22 DIAGNOSIS — N189 Chronic kidney disease, unspecified: Secondary | ICD-10-CM

## 2013-04-22 LAB — CBC
Hemoglobin: 10.5 g/dL — ABNORMAL LOW (ref 12.0–15.0)
MCHC: 33.7 g/dL (ref 30.0–36.0)
MCV: 88.1 fL (ref 78.0–100.0)
RBC: 3.54 MIL/uL — ABNORMAL LOW (ref 3.87–5.11)

## 2013-04-22 LAB — URINE CULTURE
Colony Count: NO GROWTH
Culture: NO GROWTH

## 2013-04-22 LAB — TSH: TSH: 1.714 u[IU]/mL (ref 0.350–4.500)

## 2013-04-22 LAB — CREATININE, SERUM
Creatinine, Ser: 1.4 mg/dL — ABNORMAL HIGH (ref 0.50–1.10)
GFR calc Af Amer: 41 mL/min — ABNORMAL LOW (ref >90)
GFR calc non Af Amer: 35 mL/min — ABNORMAL LOW (ref >90)

## 2013-04-22 LAB — CLOSTRIDIUM DIFFICILE BY PCR: Toxigenic C. Difficile by PCR: NEGATIVE

## 2013-04-22 MED ORDER — SODIUM CHLORIDE 0.9 % IV SOLN
250.0000 mg | Freq: Three times a day (TID) | INTRAVENOUS | Status: DC
  Administered 2013-04-22 – 2013-04-25 (×7): 250 mg via INTRAVENOUS
  Filled 2013-04-22 (×10): qty 250

## 2013-04-22 MED ORDER — PSYLLIUM 95 % PO PACK
1.0000 | PACK | Freq: Every day | ORAL | Status: DC
  Administered 2013-04-22 – 2013-04-23 (×2): 1 via ORAL
  Filled 2013-04-22 (×3): qty 1

## 2013-04-22 MED ORDER — ALBUTEROL SULFATE (5 MG/ML) 0.5% IN NEBU
2.5000 mg | INHALATION_SOLUTION | Freq: Four times a day (QID) | RESPIRATORY_TRACT | Status: DC | PRN
  Administered 2013-04-22 – 2013-04-23 (×2): 2.5 mg via RESPIRATORY_TRACT
  Filled 2013-04-22: qty 0.5
  Filled 2013-04-22: qty 3

## 2013-04-22 MED ORDER — ACETAMINOPHEN 325 MG PO TABS
650.0000 mg | ORAL_TABLET | Freq: Four times a day (QID) | ORAL | Status: DC | PRN
  Administered 2013-04-22 (×2): 650 mg via ORAL
  Filled 2013-04-22 (×2): qty 2

## 2013-04-22 MED ORDER — SODIUM CHLORIDE 0.9 % IV SOLN
500.0000 mg | Freq: Three times a day (TID) | INTRAVENOUS | Status: DC
  Filled 2013-04-22 (×2): qty 500

## 2013-04-22 NOTE — Progress Notes (Signed)
Pt has had increased frequency with 10+ episodes of urination since 0700. Pt is wheezing and SOB. MD notified and orders received.

## 2013-04-22 NOTE — Progress Notes (Signed)
Patient is alert and oriented. Transported form [facility] via EMS accompanied by her two daughters. Patient was oriented to room, bed is in lowest position with call bell within reach. Awaiting admitting provider. Will continue to monitor.

## 2013-04-22 NOTE — Progress Notes (Signed)
ANTIBIOTIC CONSULT NOTE - INITIAL  Pharmacy Consult for primaxin Indication: bacteremia  Allergies  Allergen Reactions  . Codeine   . Penicillins     Patient Measurements: Height: 5\' 2"  (157.5 cm) Weight: 121 lb 7.6 oz (55.1 kg) IBW/kg (Calculated) : 50.1   Vital Signs: Temp: 97.9 F (36.6 C) (12/28 1742) Temp src: Oral (12/28 1742) BP: 106/52 mmHg (12/28 1314) Pulse Rate: 69 (12/28 1314) Intake/Output from previous day:   Intake/Output from this shift: Total I/O In: 1336.7 [P.O.:410; I.V.:526.7; IV Piggyback:400] Out: 950 [Urine:950]  Labs:  Recent Labs  04/21/13 1420 04/22/13  WBC 27.6* 21.4*  HGB 11.4* 10.5*  PLT 189 169  CREATININE 1.60* 1.40*   Estimated Creatinine Clearance: 26.2 ml/min (by C-G formula based on Cr of 1.4). No results found for this basename: VANCOTROUGH, Leodis Binet, VANCORANDOM, GENTTROUGH, GENTPEAK, GENTRANDOM, TOBRATROUGH, TOBRAPEAK, TOBRARND, AMIKACINPEAK, AMIKACINTROU, AMIKACIN,  in the last 72 hours   Microbiology: Recent Results (from the past 720 hour(s))  CULTURE, BLOOD (ROUTINE X 2)     Status: None   Collection Time    04/21/13  3:30 PM      Result Value Range Status   Specimen Description BLOOD RIGHT ARM   Final   Special Requests BOTTLES DRAWN AEROBIC AND ANAEROBIC [facility] EACH   Final   Culture  Setup Time     Final   Value: 04/21/2013 21:06     Performed at [facility]   Culture     Final   Value: GRAM NEGATIVE RODS     Note: Gram Stain Report Called to,Read Back By and Verified With: RACHEL LESPERANCE 04/22/13 @ 5:35PM BY RUSCOE A.     Performed at [facility]   Report Status PENDING   Incomplete  CULTURE, BLOOD (ROUTINE X 2)     Status: None   Collection Time    04/21/13  3:40 PM      Result Value Range Status   Specimen Description BLOOD LEFT ARM   Final   Special Requests BOTTLES DRAWN AEROBIC AND ANAEROBIC 5CC EACH   Final   Culture  Setup Time     Final   Value: 04/21/2013 21:05     Performed at  [facility]   Culture     Final   Value:        BLOOD CULTURE RECEIVED NO GROWTH TO DATE CULTURE WILL BE HELD FOR 5 DAYS BEFORE ISSUING A FINAL NEGATIVE REPORT     Performed at [facility]   Report Status PENDING   Incomplete  CLOSTRIDIUM DIFFICILE BY PCR     Status: None   Collection Time    04/22/13  9:30 AM      Result Value Range Status   C difficile by pcr NEGATIVE  NEGATIVE Final    Medical History: Past Medical History  Diagnosis Date  . Emphysema     followed by Dr. Delford Field  . S/P thoracic aortic aneurysm repair 1/10    also with bioprosthetic aortic valve prelacement [facility]). in 10/10 she has a descending thoraic aorta anuerysm and abdominal reapir [facility]). Vascular urgeon is Dr. Ty Hilts.   . Pulmonary embolism 2010    She is no onger taking coumadin  . Hypothyroidism   . Blind right eye   . Hyperlipidemia   . S/P aortic valve replacement with bioprosthetic valve     ehco (8/11) with EF 55-60%, mild-mod MR, mild-mod TR, poorly visualized bioprosthetic aortic valve but no significant regurgitation  and gradient not significanly elevated.    . CKD (chronic kidney disease)   . HTN (hypertension)   . History of Doppler echocardiogram     carotid dopplers. (8/11) no significant stenosis  . COPD (chronic obstructive pulmonary disease)   . SVT (supraventricular tachycardia)     HOLTER MONITOR AFTER SURGERY   . GERD (gastroesophageal reflux disease)   . Arthritis   . Cataract     RT    Assessment: Patient is  A 79 y.o F admitted with diverticulitis/sepsis with 1/2 blood cultures positive for GNR.  To broaden abx to primaxin.  PCN listed as allergy.  Patient and family not sure if this is one of her allergies or recall any specific rxns to this.    Plan:  1) primaxin 250mg  IV q8h 2) will monitor closely for adverse rxn  Danicia Terhaar P 04/22/2013,5:47 PM

## 2013-04-22 NOTE — Progress Notes (Signed)
[facility] PROGRESS NOTE  Sarah Johnson:[id number] DOB: 1932/05/24 DOA: 04/21/2013 PCP: Juline Patch, MD  Assessment/Plan: Principal Problem:   Diverticulitis/ sepsis - At this point we'll continue Cipro and Flagyl IV - WBC trending down, vital signs stable - insert foley catheter for accurate monitoring of intake and output given incontinence - Supportive therapy  Active Problems:   HYPERLIPIDEMIA-MIXED -Continue aspirin and atorvastatin    HYPERTENSION, UNSPECIFIED -Patient on amlodipine, losartan, metoprolol will continue at this point    AORTIC ANEURYSM - Stable no new complaints of chest discomfort    EMPHYSEMA - Stable, no wheezes or increased work of breathing - Supplemental oxygen as needed, continue to let her    S/P aortic valve replacement   CKD (chronic kidney disease) - Serum creatinine At or near baseline    Leukocytosis - Most likely secondary to primary problem we'll continue IV antibiotics. Currently trending down    Dehydration - Patient with many bouts of diarrhea. Most likely secondary to principal problem and plan will be to continue rehydration with IV fluids and encourage by mouth intake.  Diarrhea - Will try bulking up stools with fiber supplement - Likely secondary to principal problem versus medication side effect (antibiotics)  Code Status: Full Family Communication: Discussed with patient and daughters at bedside Disposition Plan: With resolution of diarrhea and improvement in abdominal discomfort and leukocytosis   Consultants:  None  Procedures:  None  Antibiotics: Ciprofloxacin Metronidazole  HPI/Subjective: Patient states she feels better. No new complaints. Answered daughters as well as patient's questions to their satisfaction.  Objective: Filed Vitals:   04/22/13 1011  BP: 124/68  Pulse: 70  Temp:   Resp:     Intake/Output Summary (Last 24 hours) at 04/22/13 1215 Last data filed at 04/22/13 1028   Gross per 24 hour  Intake    100 ml  Output      0 ml  Net    100 ml   Filed Weights   04/21/13 1226 04/21/13 2154  Weight: 56.7 kg (125 lb) 55.1 kg (121 lb 7.6 oz)    Exam:   General:  Pt in NAD, Alert and awake  Cardiovascular: warm and pink extremities, no cyanosis  Respiratory: no increase WOB, no audible wheezes  Abdomen: ND, obese  Musculoskeletal: no cyanosis or clubbing   Data Reviewed: Basic Metabolic Panel:  Recent Labs Lab 04/21/13 1420 04/22/13  NA 134*  --   K 4.3  --   CL 96  --   CO2 23  --   GLUCOSE 111*  --   BUN 22  --   CREATININE 1.60* 1.40*  CALCIUM 9.5  --    Liver Function Tests:  Recent Labs Lab 04/21/13 1420  AST 34  ALT 15  ALKPHOS 90  BILITOT 0.7  PROT 7.3  ALBUMIN 3.1*    Recent Labs Lab 04/21/13 1420  LIPASE 24   No results found for this basename: AMMONIA,  in the last 168 hours CBC:  Recent Labs Lab 04/21/13 1420 04/22/13  WBC 27.6* 21.4*  NEUTROABS 25.3*  --   HGB 11.4* 10.5*  HCT 34.7* 31.2*  MCV 87.6 88.1  PLT 189 169   Cardiac Enzymes: No results found for this basename: CKTOTAL, CKMB, CKMBINDEX, TROPONINI,  in the last 168 hours BNP (last 3 results) No results found for this basename: PROBNP,  in the last 8760 hours CBG: No results found for this basename: GLUCAP,  in the last 168 hours  Recent Results (from  the past 240 hour(s))  CLOSTRIDIUM DIFFICILE BY PCR     Status: None   Collection Time    04/22/13  9:30 AM      Result Value Range Status   C difficile by pcr NEGATIVE  NEGATIVE Final     Studies: Dg Chest 2 View  04/21/2013   CLINICAL DATA:  Nausea, vomiting, diarrhea, low grade fever.  EXAM: CHEST  2 VIEW  COMPARISON:  06/04/2011  FINDINGS: Aortic knob remains prominent. Postoperative changes from sternotomy and aortic aneurysm repair. Central pulmonary vasculature remains prominent.  10 mm ill-defined ovoid opacity in the lateral left midlung zone is nonspecific. Scarring at the lung  bases is unchanged. No pneumothorax or pleural effusion.  IMPRESSION: Chronic and postoperative changes.  New left midlung opacity. This may represent focal airspace disease. FOLLOW UP studies until resolution are recommended.   Electronically Signed   By: Maryclare Bean M.D.   On: 04/21/2013 17:28   Ct Abdomen Pelvis W Contrast  04/21/2013   CLINICAL DATA:  Leukocytosis with [place] of [address]. Fever. Prior history of diverticulitis. Prior splenectomy during abdominal aortic aneurysm repair.  EXAM: CT ABDOMEN AND PELVIS WITH CONTRAST  TECHNIQUE: Multidetector CT imaging of the abdomen and pelvis was performed using the standard protocol following bolus administration of intravenous contrast.  CONTRAST:  75mL OMNIPAQUE IOHEXOL 300 MG/ML IV. Oral contrast was also administered. (Reduced intravenous contrast dose per Dr. Chilton Si due to the patient's creatinine of 1.6 and estimated GFR approximately 34.)  COMPARISON:  None.  FINDINGS: Edema/inflammation surrounding the distal sigmoid colon at the rectosigmoid junction in the low pelvis associated with a several cm segment of wall thickening of the distal sigmoid colon. No extraluminal gas. No abnormal fluid collection. No ascites.  Scattered diverticula involving the distal descending colon. Remainder of the colon unremarkable. Lipoma involving the ileocecal valve. Normal appendix in the right mid pelvis, filled with oral contrast. Stomach and small bowel normal in appearance.  Normal appearing liver, adrenal glands, and left kidney. Spleen surgically absent. Calcification in the mid body of the otherwise normal-appearing pancreas. Approximate 1.7 cm partially calcified cholesterol gallstone within the otherwise normal-appearing gallbladder. No biliary ductal dilation. Approximate 3.5 x 3.0 cm simple cyst arising from the upper pole of the otherwise normal-appearing right kidney.  Prior thoracoabdominal aortic aneurysm repair with haziness in the fat surrounding  the distal thoracic and upper abdominal aorta, consistent with scarring. Scarring in the left subphrenic region related to the prior splenectomy. Thinning and elevation of the left hemidiaphragm without evidence of diaphragmatic hernia. Native abdominal aortic and iliofemoral atherosclerosis without aneurysm.  Bone window images demonstrate generalized osseous demineralization, degenerative disc disease at L4-5 and L5-S1, facet degenerative changes involving the lower lumbar spine including a degenerative grade 1 spondylolisthesis of L5 on S1 approximating 8 mm. Scar or atelectasis in the right middle lobe and left lower lobe. Heart enlarged.  IMPRESSION: 1. Acute diverticulitis involving the distal sigmoid colon at the rectosigmoid junction, low in the pelvis. No evidence of perforation or abscess. 2. Scattered diverticula in the descending colon without evidence of acute diverticulitis elsewhere. 3. Isolated calcification in the mid body of the otherwise normal-appearing pancreas, query prior episode of pancreatitis. 4. Cholelithiasis without CT evidence of acute cholecystitis. 5. Right femoral hernia containing fat.   Electronically Signed   By: Hulan Saas M.D.   On: 04/21/2013 18:28    Scheduled Meds: . amLODipine  5 mg Oral Daily  . aspirin EC  81  mg Oral Daily  . atorvastatin  20 mg Oral q1800  . ciprofloxacin  400 mg Intravenous Q12H  . ergocalciferol  50,000 Units Oral Q30 days  . heparin  5,000 Units Subcutaneous Q8H  . levothyroxine  125 mcg Oral QAC breakfast  . losartan  100 mg Oral Daily  . metoprolol  50 mg Oral BID  . metronidazole  500 mg Intravenous Q8H  . mometasone-formoterol  2 puff Inhalation BID  . multivitamin with minerals  1 tablet Oral Daily  . pantoprazole  40 mg Oral Daily  . psyllium  1 packet Oral Daily  . zinc oxide   Topical BID   Continuous Infusions: . dextrose 5 % and 0.9 % NaCl with KCl 40 mEq/L 50 mL/hr at 04/22/13 1034    Principal Problem:    Diverticulitis Active Problems:   HYPERLIPIDEMIA-MIXED   HYPERTENSION, UNSPECIFIED   AORTIC ANEURYSM   EMPHYSEMA   S/P aortic valve replacement   CKD (chronic kidney disease)   Leukocytosis   Dehydration    Time spent: > 35 minutes    Penny Pia  [facility] Pager [phone]. If 7PM-7AM, please contact night-coverage at [email or web address], password [facility] 04/22/2013, 12:15 PM  LOS: 1 day

## 2013-04-22 NOTE — Progress Notes (Signed)
Lab notified RN of positive blood culture. Aerobic is positive for gram negative rods.

## 2013-04-22 NOTE — Progress Notes (Signed)
Family and patient do not recall a reaction to penicillins and state "I do not know why that is listed as an allergy".

## 2013-04-23 DIAGNOSIS — R7881 Bacteremia: Secondary | ICD-10-CM

## 2013-04-23 DIAGNOSIS — E785 Hyperlipidemia, unspecified: Secondary | ICD-10-CM

## 2013-04-23 LAB — BASIC METABOLIC PANEL
Calcium: 7.9 mg/dL — ABNORMAL LOW (ref 8.4–10.5)
Creatinine, Ser: 1.18 mg/dL — ABNORMAL HIGH (ref 0.50–1.10)
GFR calc Af Amer: 50 mL/min — ABNORMAL LOW (ref >90)
Glucose, Bld: 101 mg/dL — ABNORMAL HIGH (ref 70–99)

## 2013-04-23 LAB — CBC
MCH: 28.6 pg (ref 26.0–34.0)
MCHC: 32.8 g/dL (ref 30.0–36.0)
Platelets: 140 10*3/uL — ABNORMAL LOW (ref 150–400)
RDW: 15.9 % — ABNORMAL HIGH (ref 11.5–15.5)
WBC: 14.2 10*3/uL — ABNORMAL HIGH (ref 4.0–10.5)

## 2013-04-23 MED ORDER — ALBUTEROL SULFATE (2.5 MG/3ML) 0.083% IN NEBU
2.5000 mg | INHALATION_SOLUTION | Freq: Four times a day (QID) | RESPIRATORY_TRACT | Status: DC | PRN
  Filled 2013-04-23: qty 3

## 2013-04-23 MED ORDER — PSYLLIUM 95 % PO PACK
1.0000 | PACK | Freq: Two times a day (BID) | ORAL | Status: DC
  Administered 2013-04-23 – 2013-04-24 (×3): 1 via ORAL
  Filled 2013-04-23 (×5): qty 1

## 2013-04-23 NOTE — Progress Notes (Signed)
[facility] PROGRESS NOTE  Sarah Johnson ZOX:[id number] DOB: [birth year] DOA: 04/21/2013 PCP: Juline Patch, MD  Assessment/Plan: Principal Problem:   Diverticulitis/ sepsis - Blood cultures currently growing Escherichia coli sensitivities pending. Continue Permax and and Flagyl. - WBC trending down, vital signs stable - Given improvement in condition will have nursing remove Foley. - Supportive therapy  Bacteremia - Followup on cultures as mentioned above currently growing gram-negative organism (Escherichia coli). - We'll plan on monitoring WBC counts which are currently trending down. Last WBC level at 14 from 21 - Sensitivities pending and will plan on tailoring antibiotics based on the results  Active Problems:   HYPERLIPIDEMIA-MIXED -Continue aspirin and atorvastatin    HYPERTENSION, UNSPECIFIED -Patient on amlodipine, losartan, metoprolol will continue at this point    AORTIC ANEURYSM - Stable no new complaints of chest discomfort    EMPHYSEMA - Stable, no wheezes or increased work of breathing - Supplemental oxygen as needed, continue to let her    S/P aortic valve replacement   CKD (chronic kidney disease) - Serum creatinine At or near baseline but has been trending down with IV fluid rehydration. Currently 1.1 from 1.4.    Leukocytosis - Most likely secondary to primary problems. Trending down on current IV antibiotics    Dehydration - Patient with many bouts of diarrhea. Most likely secondary to principal problem. Improving with improved oral intake and IV fluids  Diarrhea - Will increase fiber supplementation - Likely secondary to principal problem - C. difficile negative  Code Status: Full Family Communication: Discussed with patient and daughters at bedside Disposition Plan: With resolution of diarrhea and improvement in abdominal discomfort and  leukocytosis   Consultants:  None  Procedures:  None  Antibiotics: Ciprofloxacin Metronidazole  HPI/Subjective: Patient states she feels better. States that her diarrhea has slowed down. Like to get Foley taken out.  Objective: Filed Vitals:   04/23/13 0944  BP: 109/61  Pulse: 80  Temp:   Resp:     Intake/Output Summary (Last 24 hours) at 04/23/13 1410 Last data filed at 04/23/13 1400  Gross per 24 hour  Intake 3406.67 ml  Output   2301 ml  Net 1105.67 ml   Filed Weights   04/21/13 1226 04/21/13 2154  Weight: 56.7 kg (125 lb) 55.1 kg (121 lb 7.6 oz)    Exam:   General:  Pt in NAD, Alert and awake  Cardiovascular: warm and pink extremities, no cyanosis  Respiratory: no increase WOB, no audible wheezes  Abdomen:  obese, nondistended, soft, positive bowel sounds  Musculoskeletal: no cyanosis or clubbing   Data Reviewed: Basic Metabolic Panel:  Recent Labs Lab 04/21/13 1420 04/22/13 04/23/13 0615  NA 134*  --  138  K 4.3  --  4.2  CL 96  --  108  CO2 23  --  21  GLUCOSE 111*  --  101*  BUN 22  --  8  CREATININE 1.60* 1.40* 1.18*  CALCIUM 9.5  --  7.9*   Liver Function Tests:  Recent Labs Lab 04/21/13 1420  AST 34  ALT 15  ALKPHOS 90  BILITOT 0.7  PROT 7.3  ALBUMIN 3.1*    Recent Labs Lab 04/21/13 1420  LIPASE 24   No results found for this basename: AMMONIA,  in the last 168 hours CBC:  Recent Labs Lab 04/21/13 1420 04/22/13 04/23/13 0615  WBC 27.6* 21.4* 14.2*  NEUTROABS 25.3*  --   --   HGB 11.4* 10.5* 9.4*  HCT 34.7* 31.2* 28.7*  MCV 87.6 88.1 87.2  PLT 189 169 140*   Cardiac Enzymes: No results found for this basename: CKTOTAL, CKMB, CKMBINDEX, TROPONINI,  in the last 168 hours BNP (last 3 results) No results found for this basename: PROBNP,  in the last 8760 hours CBG: No results found for this basename: GLUCAP,  in the last 168 hours  Recent Results (from the past 240 hour(s))  CULTURE, BLOOD (ROUTINE X 2)      Status: None   Collection Time    04/21/13  3:30 PM      Result Value Range Status   Specimen Description BLOOD RIGHT ARM   Final   Special Requests BOTTLES DRAWN AEROBIC AND ANAEROBIC [facility] EACH   Final   Culture  Setup Time     Final   Value: 04/21/2013 21:06     Performed at [facility]   Culture     Final   Value: ESCHERICHIA COLI     Note: Gram Stain Report Called to,Read Back By and Verified With: RACHEL LESPERANCE 04/22/13 @ 5:35PM BY RUSCOE A.     Performed at [facility]   Report Status PENDING   Incomplete  CULTURE, BLOOD (ROUTINE X 2)     Status: None   Collection Time    04/21/13  3:40 PM      Result Value Range Status   Specimen Description BLOOD LEFT ARM   Final   Special Requests BOTTLES DRAWN AEROBIC AND ANAEROBIC 5CC EACH   Final   Culture  Setup Time     Final   Value: 04/21/2013 21:05     Performed at [facility]   Culture     Final   Value:        BLOOD CULTURE RECEIVED NO GROWTH TO DATE CULTURE WILL BE HELD FOR 5 DAYS BEFORE ISSUING A FINAL NEGATIVE REPORT     Performed at [facility]   Report Status PENDING   Incomplete  URINE CULTURE     Status: None   Collection Time    04/21/13  4:04 PM      Result Value Range Status   Specimen Description URINE, CLEAN CATCH   Final   Special Requests NONE   Final   Culture  Setup Time     Final   Value: 04/21/2013 22:11     Performed at [facility] Count     Final   Value: NO GROWTH     Performed at [facility]   Culture     Final   Value: NO GROWTH     Performed at [facility]   Report Status 04/22/2013 FINAL   Final  CLOSTRIDIUM DIFFICILE BY PCR     Status: None   Collection Time    04/22/13  9:30 AM      Result Value Range Status   C difficile by pcr NEGATIVE  NEGATIVE Final     Studies: Dg Chest 2 View  04/21/2013   CLINICAL DATA:  Nausea, vomiting, diarrhea, low grade fever.  EXAM: CHEST  2 VIEW  COMPARISON:  06/04/2011   FINDINGS: Aortic knob remains prominent. Postoperative changes from sternotomy and aortic aneurysm repair. Central pulmonary vasculature remains prominent.  10 mm ill-defined ovoid opacity in the lateral left midlung zone is nonspecific. Scarring at the lung bases is unchanged. No pneumothorax or pleural effusion.  IMPRESSION: Chronic and postoperative changes.  New left midlung opacity. This may represent focal airspace  disease. FOLLOW UP studies until resolution are recommended.   Electronically Signed   By: Maryclare Bean M.D.   On: 04/21/2013 17:28   Ct Abdomen Pelvis W Contrast  04/21/2013   CLINICAL DATA:  Leukocytosis with [place] blood count of [address]. Fever. Prior history of diverticulitis. Prior splenectomy during abdominal aortic aneurysm repair.  EXAM: CT ABDOMEN AND PELVIS WITH CONTRAST  TECHNIQUE: Multidetector CT imaging of the abdomen and pelvis was performed using the standard protocol following bolus administration of intravenous contrast.  CONTRAST:  75mL OMNIPAQUE IOHEXOL 300 MG/ML IV. Oral contrast was also administered. (Reduced intravenous contrast dose per Dr. Chilton Si due to the patient's creatinine of 1.6 and estimated GFR approximately 34.)  COMPARISON:  None.  FINDINGS: Edema/inflammation surrounding the distal sigmoid colon at the rectosigmoid junction in the low pelvis associated with a several cm segment of wall thickening of the distal sigmoid colon. No extraluminal gas. No abnormal fluid collection. No ascites.  Scattered diverticula involving the distal descending colon. Remainder of the colon unremarkable. Lipoma involving the ileocecal valve. Normal appendix in the right mid pelvis, filled with oral contrast. Stomach and small bowel normal in appearance.  Normal appearing liver, adrenal glands, and left kidney. Spleen surgically absent. Calcification in the mid body of the otherwise normal-appearing pancreas. Approximate 1.7 cm partially calcified cholesterol gallstone within the  otherwise normal-appearing gallbladder. No biliary ductal dilation. Approximate 3.5 x 3.0 cm simple cyst arising from the upper pole of the otherwise normal-appearing right kidney.  Prior thoracoabdominal aortic aneurysm repair with haziness in the fat surrounding the distal thoracic and upper abdominal aorta, consistent with scarring. Scarring in the left subphrenic region related to the prior splenectomy. Thinning and elevation of the left hemidiaphragm without evidence of diaphragmatic hernia. Native abdominal aortic and iliofemoral atherosclerosis without aneurysm.  Bone window images demonstrate generalized osseous demineralization, degenerative disc disease at L4-5 and L5-S1, facet degenerative changes involving the lower lumbar spine including a degenerative grade 1 spondylolisthesis of L5 on S1 approximating 8 mm. Scar or atelectasis in the right middle lobe and left lower lobe. Heart enlarged.  IMPRESSION: 1. Acute diverticulitis involving the distal sigmoid colon at the rectosigmoid junction, low in the pelvis. No evidence of perforation or abscess. 2. Scattered diverticula in the descending colon without evidence of acute diverticulitis elsewhere. 3. Isolated calcification in the mid body of the otherwise normal-appearing pancreas, query prior episode of pancreatitis. 4. Cholelithiasis without CT evidence of acute cholecystitis. 5. Right femoral hernia containing fat.   Electronically Signed   By: Hulan Saas M.D.   On: 04/21/2013 18:28    Scheduled Meds: . amLODipine  5 mg Oral Daily  . aspirin EC  81 mg Oral Daily  . atorvastatin  20 mg Oral q1800  . ergocalciferol  50,000 Units Oral Q30 days  . heparin  5,000 Units Subcutaneous Q8H  . imipenem-cilastatin  250 mg Intravenous Q8H  . levothyroxine  125 mcg Oral QAC breakfast  . losartan  100 mg Oral Daily  . metoprolol  50 mg Oral BID  . metronidazole  500 mg Intravenous Q8H  . mometasone-formoterol  2 puff Inhalation BID  .  multivitamin with minerals  1 tablet Oral Daily  . pantoprazole  40 mg Oral Daily  . psyllium  1 packet Oral BID  . zinc oxide   Topical BID   Continuous Infusions: . dextrose 5 % and 0.9 % NaCl with KCl 40 mEq/L 50 mL/hr at 04/23/13 0511    Principal Problem:  Diverticulitis Active Problems:   HYPERLIPIDEMIA-MIXED   HYPERTENSION, UNSPECIFIED   AORTIC ANEURYSM   EMPHYSEMA   S/P aortic valve replacement   CKD (chronic kidney disease)   Leukocytosis   Dehydration    Time spent: > 35 minutes    Penny Pia  [facility] Pager [phone]. If 7PM-7AM, please contact night-coverage at [email or web address], password [facility] 04/23/2013, 2:10 PM  LOS: 2 days

## 2013-04-23 NOTE — Progress Notes (Signed)
UR completed. Judaea Burgoon RN CCM Case Mgmt phone [phone] 

## 2013-04-24 DIAGNOSIS — E860 Dehydration: Secondary | ICD-10-CM

## 2013-04-24 DIAGNOSIS — N179 Acute kidney failure, unspecified: Secondary | ICD-10-CM

## 2013-04-24 DIAGNOSIS — R197 Diarrhea, unspecified: Secondary | ICD-10-CM

## 2013-04-24 DIAGNOSIS — K5732 Diverticulitis of large intestine without perforation or abscess without bleeding: Secondary | ICD-10-CM

## 2013-04-24 LAB — CBC
HCT: 29 % — ABNORMAL LOW (ref 36.0–46.0)
Hemoglobin: 9.8 g/dL — ABNORMAL LOW (ref 12.0–15.0)
MCHC: 33.8 g/dL (ref 30.0–36.0)
RDW: 16.1 % — ABNORMAL HIGH (ref 11.5–15.5)
WBC: 11 10*3/uL — ABNORMAL HIGH (ref 4.0–10.5)

## 2013-04-24 MED ORDER — ALBUTEROL SULFATE (2.5 MG/3ML) 0.083% IN NEBU: 2.5000 mg | INHALATION_SOLUTION | Freq: Four times a day (QID) | RESPIRATORY_TRACT | Status: DC | PRN

## 2013-04-24 MED ORDER — METRONIDAZOLE 500 MG PO TABS
500.0000 mg | ORAL_TABLET | Freq: Three times a day (TID) | ORAL | Status: DC
  Administered 2013-04-24 – 2013-04-25 (×3): 500 mg via ORAL
  Filled 2013-04-24 (×6): qty 1

## 2013-04-24 NOTE — Progress Notes (Signed)
[facility] PROGRESS NOTE  Sarah Johnson EAV:[id number] DOB: [birth year] DOA: 04/21/2013 PCP: Juline Patch, MD  Assessment/Plan: Principal Problem:   Diverticulitis/ sepsis - Blood cultures currently growing Escherichia coli sensitivities pending. Continue Permax and and Flagyl. - WBC trending down, vital signs stable - Given improvement in condition will have nursing remove Foley. - Supportive therapy  Bacteremia - Followup on cultures as mentioned above currently growing gram-negative organism (Escherichia coli). - We'll plan on monitoring WBC counts which are currently trending down. Last WBC level at 14 from 21 - Sensitivities pending and will plan on tailoring antibiotics based on the results  Active Problems:   HYPERLIPIDEMIA-MIXED -Continue aspirin and atorvastatin    HYPERTENSION, UNSPECIFIED -Patient on amlodipine, losartan, metoprolol will continue at this point    AORTIC ANEURYSM - Stable no new complaints of chest discomfort    EMPHYSEMA - Stable, no wheezes or increased work of breathing - Supplemental oxygen when necessary    S/P aortic valve replacement   CKD (chronic kidney disease) - Serum creatinine At or near baseline but has been trending down with IV fluid rehydration. Currently 1.1 from 1.4.    Leukocytosis - Most likely secondary to primary problems. Trending down on current IV antibiotics - Continues to trend down on current IV antibiotic therapy    Dehydration - Patient with many bouts of diarrhea. Most likely secondary to principal problem. Improving with improved oral intake and IV fluids (initially).  Diarrhea - Continue fiber supplementation - Likely secondary to principal problem - C. difficile negative - Resolving  Code Status: Full Family Communication: Discussed with patient and daughters at bedside Disposition Plan: With resolution of diarrhea and improvement in abdominal discomfort and  leukocytosis   Consultants:  None  Procedures:  None  Antibiotics: Ciprofloxacin Metronidazole  HPI/Subjective: Patient states she feels better. She is requesting advancement in diet.  Objective: Filed Vitals:   04/24/13 1309  BP: 106/66  Pulse: 61  Temp: 97.5 F (36.4 C)  Resp:     Intake/Output Summary (Last 24 hours) at 04/24/13 1455 Last data filed at 04/24/13 7829  Gross per 24 hour  Intake 2067.5 ml  Output    250 ml  Net 1817.5 ml   Filed Weights   04/21/13 1226 04/21/13 2154  Weight: 56.7 kg (125 lb) 55.1 kg (121 lb 7.6 oz)    Exam:   General:  Pt in NAD, Alert and awake  Cardiovascular: warm and pink extremities, no cyanosis  Respiratory: no increase WOB, no audible wheezes  Abdomen:  obese, nondistended, soft, positive bowel sounds  Musculoskeletal: no cyanosis or clubbing   Data Reviewed: Basic Metabolic Panel:  Recent Labs Lab 04/21/13 1420 04/22/13 04/23/13 0615  NA 134*  --  138  K 4.3  --  4.2  CL 96  --  108  CO2 23  --  21  GLUCOSE 111*  --  101*  BUN 22  --  8  CREATININE 1.60* 1.40* 1.18*  CALCIUM 9.5  --  7.9*   Liver Function Tests:  Recent Labs Lab 04/21/13 1420  AST 34  ALT 15  ALKPHOS 90  BILITOT 0.7  PROT 7.3  ALBUMIN 3.1*    Recent Labs Lab 04/21/13 1420  LIPASE 24   No results found for this basename: AMMONIA,  in the last 168 hours CBC:  Recent Labs Lab 04/21/13 1420 04/22/13 04/23/13 0615 04/24/13 0750  WBC 27.6* 21.4* 14.2* 11.0*  NEUTROABS 25.3*  --   --   --  HGB 11.4* 10.5* 9.4* 9.8*  HCT 34.7* 31.2* 28.7* 29.0*  MCV 87.6 88.1 87.2 85.3  PLT 189 169 140* PLATELET CLUMPS NOTED ON SMEAR, UNABLE TO ESTIMATE   Cardiac Enzymes: No results found for this basename: CKTOTAL, CKMB, CKMBINDEX, TROPONINI,  in the last 168 hours BNP (last 3 results) No results found for this basename: PROBNP,  in the last 8760 hours CBG: No results found for this basename: GLUCAP,  in the last 168  hours  Recent Results (from the past 240 hour(s))  CULTURE, BLOOD (ROUTINE X 2)     Status: None   Collection Time    04/21/13  3:30 PM      Result Value Range Status   Specimen Description BLOOD RIGHT ARM   Final   Special Requests BOTTLES DRAWN AEROBIC AND ANAEROBIC [facility] EACH   Final   Culture  Setup Time     Final   Value: 04/21/2013 21:06     Performed at [facility]   Culture     Final   Value: ESCHERICHIA COLI     Note: Gram Stain Report Called to,Read Back By and Verified With: RACHEL LESPERANCE 04/22/13 @ 5:35PM BY RUSCOE A.     Performed at [facility]   Report Status 04/24/2013 FINAL   Final   Organism ID, Bacteria ESCHERICHIA COLI   Final  CULTURE, BLOOD (ROUTINE X 2)     Status: None   Collection Time    04/21/13  3:40 PM      Result Value Range Status   Specimen Description BLOOD LEFT ARM   Final   Special Requests BOTTLES DRAWN AEROBIC AND ANAEROBIC 5CC EACH   Final   Culture  Setup Time     Final   Value: 04/21/2013 21:05     Performed at [facility]   Culture     Final   Value:        BLOOD CULTURE RECEIVED NO GROWTH TO DATE CULTURE WILL BE HELD FOR 5 DAYS BEFORE ISSUING A FINAL NEGATIVE REPORT     Performed at [facility]   Report Status PENDING   Incomplete  URINE CULTURE     Status: None   Collection Time    04/21/13  4:04 PM      Result Value Range Status   Specimen Description URINE, CLEAN CATCH   Final   Special Requests NONE   Final   Culture  Setup Time     Final   Value: 04/21/2013 22:11     Performed at [facility] Count     Final   Value: NO GROWTH     Performed at [facility]   Culture     Final   Value: NO GROWTH     Performed at [facility]   Report Status 04/22/2013 FINAL   Final  CLOSTRIDIUM DIFFICILE BY PCR     Status: None   Collection Time    04/22/13  9:30 AM      Result Value Range Status   C difficile by pcr NEGATIVE  NEGATIVE Final     Studies: No  results found.  Scheduled Meds: . amLODipine  5 mg Oral Daily  . aspirin EC  81 mg Oral Daily  . atorvastatin  20 mg Oral q1800  . ergocalciferol  50,000 Units Oral Q30 days  . heparin  5,000 Units Subcutaneous Q8H  . imipenem-cilastatin  250 mg Intravenous Q8H  .  levothyroxine  125 mcg Oral QAC breakfast  . losartan  100 mg Oral Daily  . metoprolol  50 mg Oral BID  . metroNIDAZOLE  500 mg Oral Q8H  . mometasone-formoterol  2 puff Inhalation BID  . multivitamin with minerals  1 tablet Oral Daily  . pantoprazole  40 mg Oral Daily  . psyllium  1 packet Oral BID  . zinc oxide   Topical BID   Continuous Infusions: . dextrose 5 % and 0.9 % NaCl with KCl 40 mEq/L 50 mL/hr at 04/23/13 2332    Principal Problem:   Diverticulitis Active Problems:   HYPERLIPIDEMIA-MIXED   HYPERTENSION, UNSPECIFIED   AORTIC ANEURYSM   EMPHYSEMA   S/P aortic valve replacement   CKD (chronic kidney disease)   Leukocytosis   Dehydration    Time spent: > 35 minutes    Penny Pia  [facility] Pager [phone]. If 7PM-7AM, please contact night-coverage at [email or web address], password [facility] 04/24/2013, 2:55 PM  LOS: 3 days

## 2013-04-25 DIAGNOSIS — A419 Sepsis, unspecified organism: Secondary | ICD-10-CM

## 2013-04-25 MED ORDER — DIPHENOXYLATE-ATROPINE 2.5-0.025 MG PO TABS
1.0000 | ORAL_TABLET | Freq: Four times a day (QID) | ORAL | Status: DC | PRN
  Administered 2013-04-26: 10:00:00 1 via ORAL
  Filled 2013-04-25: qty 1

## 2013-04-25 MED ORDER — CEFUROXIME AXETIL 500 MG PO TABS
500.0000 mg | ORAL_TABLET | Freq: Two times a day (BID) | ORAL | Status: DC
  Administered 2013-04-25 – 2013-04-26 (×2): 500 mg via ORAL
  Filled 2013-04-25 (×4): qty 1

## 2013-04-25 NOTE — Progress Notes (Signed)
ANTIBIOTIC CONSULT NOTE  Pharmacy Consult for Primaxin Indication: bacteremia / diverticulitis  Allergies  Allergen Reactions  . Codeine   . Penicillins Other (See Comments)    Patient and family don't recall specific rxn to PCN    Patient Measurements: Height: 5\' 2"  (157.5 cm) Weight: 121 lb 7.6 oz (55.1 kg) IBW/kg (Calculated) : 50.1   Labs:  Recent Labs  04/23/13 0615 04/24/13 0750  WBC 14.2* 11.0*  HGB 9.4* 9.8*  PLT 140* PLATELET CLUMPS NOTED ON SMEAR, UNABLE TO ESTIMATE  CREATININE 1.18*  --    Estimated Creatinine Clearance: 31.1 ml/min (by C-G formula based on Cr of 1.18). No results found for this basename: VANCOTROUGH, Leodis Binet, VANCORANDOM, GENTTROUGH, GENTPEAK, GENTRANDOM, TOBRATROUGH, TOBRAPEAK, TOBRARND, AMIKACINPEAK, AMIKACINTROU, AMIKACIN,  in the last 72 hours   Microbiology: Recent Results (from the past 720 hour(s))  CULTURE, BLOOD (ROUTINE X 2)     Status: None   Collection Time    04/21/13  3:30 PM      Result Value Range Status   Specimen Description BLOOD RIGHT ARM   Final   Special Requests BOTTLES DRAWN AEROBIC AND ANAEROBIC [facility] EACH   Final   Culture  Setup Time     Final   Value: 04/21/2013 21:06     Performed at [facility]   Culture     Final   Value: ESCHERICHIA COLI     Note: Gram Stain Report Called to,Read Back By and Verified With: RACHEL LESPERANCE 04/22/13 @ 5:35PM BY RUSCOE A.     Performed at [facility]   Report Status 04/24/2013 FINAL   Final   Organism ID, Bacteria ESCHERICHIA COLI   Final  CULTURE, BLOOD (ROUTINE X 2)     Status: None   Collection Time    04/21/13  3:40 PM      Result Value Range Status   Specimen Description BLOOD LEFT ARM   Final   Special Requests BOTTLES DRAWN AEROBIC AND ANAEROBIC 5CC EACH   Final   Culture  Setup Time     Final   Value: 04/21/2013 21:05     Performed at [facility]   Culture     Final   Value:        BLOOD CULTURE RECEIVED NO GROWTH TO DATE CULTURE  WILL BE HELD FOR 5 DAYS BEFORE ISSUING A FINAL NEGATIVE REPORT     Performed at [facility]   Report Status PENDING   Incomplete  URINE CULTURE     Status: None   Collection Time    04/21/13  4:04 PM      Result Value Range Status   Specimen Description URINE, CLEAN CATCH   Final   Special Requests NONE   Final   Culture  Setup Time     Final   Value: 04/21/2013 22:11     Performed at [facility] Count     Final   Value: NO GROWTH     Performed at [facility]   Culture     Final   Value: NO GROWTH     Performed at [facility]   Report Status 04/22/2013 FINAL   Final  CLOSTRIDIUM DIFFICILE BY PCR     Status: None   Collection Time    04/22/13  9:30 AM      Result Value Range Status   C difficile by pcr NEGATIVE  NEGATIVE Final    Assessment: Patient is  A 79 y.o F admitted with diverticulitis/sepsis with 1/2 blood cultures positive for E coli sensitive to all antibiotics.  Scr stable  Plan:  1) Continue Primaxin 250mg  IV q8h  - please narrow antibiotics to Cipro or Rocephin (unknown allergy to PCN) (or po equivalent) 2) will monitor closely for adverse rxn  Thank you. Okey Regal, PharmD [phone]  04/25/2013,8:48 AM

## 2013-04-25 NOTE — Progress Notes (Signed)
[facility] PROGRESS NOTE  TWYLIA OKA ZOX:[id number] DOB: 18-Nov-1932 DOA: 04/21/2013 PCP: Juline Patch, MD  Assessment/Plan: Principal Problem:   Diverticulitis/ sepsis - Blood cultures growing Escherichia coli, organism pan susceptible. Will discontinue IV antibiotic therapy, and transition her to oral antibiotics. Start cefuroxime. - WBC trending down, vital signs stable. - Supportive therapy  Bacteremia - Blood cultures growing Escherichia coli, organism pan susceptible. Will discontinue IV antibiotics, start Ceftin.  - Probable source of her bacteremia diverticulitis.   Active Problems:   HYPERLIPIDEMIA-MIXED -Continue aspirin and atorvastatin    HYPERTENSION, UNSPECIFIED -Patient on amlodipine, losartan, metoprolol will continue at this point    AORTIC ANEURYSM - Stable no new complaints of chest discomfort    EMPHYSEMA - Stable, no wheezes or increased work of breathing - Supplemental oxygen when necessary    S/P aortic valve replacement   CKD (chronic kidney disease) - Serum creatinine At or near baseline but has been trending down with IV fluid rehydration. Currently 1.1 from 1.4.    Leukocytosis - Secondary to acute diverticulitis    Dehydration - Patient with many bouts of diarrhea. Most likely secondary to principal problem. Improving with improved oral intake and IV fluids (initially).  Diarrhea - Stool for C. difficile was negative.  Code Status: Full Family Communication: Discussed with patient and daughters at bedside Disposition Plan: With resolution of diarrhea and improvement in abdominal discomfort and leukocytosis   Consultants:  None  Procedures:  None  Antibiotics: Cefuroxime (started on 04/25/2013)  HPI/Subjective: Patient states feeling better today, she ambulated down the hallway with physical therapy. She is asking that we advance her diet today to a regular diet. I discussed case with her daughter present at bedside  update on patient's condition.  Objective: Filed Vitals:   04/25/13 1330  BP: 126/52  Pulse: 60  Temp: 97.8 F (36.6 C)  Resp: 18    Intake/Output Summary (Last 24 hours) at 04/25/13 1548 Last data filed at 04/25/13 1100  Gross per 24 hour  Intake    340 ml  Output      0 ml  Net    340 ml   Filed Weights   04/21/13 1226 04/21/13 2154  Weight: 56.7 kg (125 lb) 55.1 kg (121 lb 7.6 oz)    Exam:   General:  Pt in NAD, Alert and awake  Cardiovascular: warm and pink extremities, no cyanosis  Respiratory: no increase WOB, no audible wheezes  Abdomen:  obese, nondistended, soft, positive bowel sounds  Musculoskeletal: no cyanosis or clubbing   Data Reviewed: Basic Metabolic Panel:  Recent Labs Lab 04/21/13 1420 04/22/13 04/23/13 0615  NA 134*  --  138  K 4.3  --  4.2  CL 96  --  108  CO2 23  --  21  GLUCOSE 111*  --  101*  BUN 22  --  8  CREATININE 1.60* 1.40* 1.18*  CALCIUM 9.5  --  7.9*   Liver Function Tests:  Recent Labs Lab 04/21/13 1420  AST 34  ALT 15  ALKPHOS 90  BILITOT 0.7  PROT 7.3  ALBUMIN 3.1*    Recent Labs Lab 04/21/13 1420  LIPASE 24   No results found for this basename: AMMONIA,  in the last 168 hours CBC:  Recent Labs Lab 04/21/13 1420 04/22/13 04/23/13 0615 04/24/13 0750  WBC 27.6* 21.4* 14.2* 11.0*  NEUTROABS 25.3*  --   --   --   HGB 11.4* 10.5* 9.4* 9.8*  HCT 34.7* 31.2* 28.7* 29.0*  MCV 87.6 88.1 87.2 85.3  PLT 189 169 140* PLATELET CLUMPS NOTED ON SMEAR, UNABLE TO ESTIMATE   Cardiac Enzymes: No results found for this basename: CKTOTAL, CKMB, CKMBINDEX, TROPONINI,  in the last 168 hours BNP (last 3 results) No results found for this basename: PROBNP,  in the last 8760 hours CBG: No results found for this basename: GLUCAP,  in the last 168 hours  Recent Results (from the past 240 hour(s))  CULTURE, BLOOD (ROUTINE X 2)     Status: None   Collection Time    04/21/13  3:30 PM      Result Value Range Status    Specimen Description BLOOD RIGHT ARM   Final   Special Requests BOTTLES DRAWN AEROBIC AND ANAEROBIC [facility] EACH   Final   Culture  Setup Time     Final   Value: 04/21/2013 21:06     Performed at [facility]   Culture     Final   Value: ESCHERICHIA COLI     Note: Gram Stain Report Called to,Read Back By and Verified With: RACHEL LESPERANCE 04/22/13 @ 5:35PM BY RUSCOE A.     Performed at [facility]   Report Status 04/24/2013 FINAL   Final   Organism ID, Bacteria ESCHERICHIA COLI   Final  CULTURE, BLOOD (ROUTINE X 2)     Status: None   Collection Time    04/21/13  3:40 PM      Result Value Range Status   Specimen Description BLOOD LEFT ARM   Final   Special Requests BOTTLES DRAWN AEROBIC AND ANAEROBIC 5CC EACH   Final   Culture  Setup Time     Final   Value: 04/21/2013 21:05     Performed at [facility]   Culture     Final   Value:        BLOOD CULTURE RECEIVED NO GROWTH TO DATE CULTURE WILL BE HELD FOR 5 DAYS BEFORE ISSUING A FINAL NEGATIVE REPORT     Performed at [facility]   Report Status PENDING   Incomplete  URINE CULTURE     Status: None   Collection Time    04/21/13  4:04 PM      Result Value Range Status   Specimen Description URINE, CLEAN CATCH   Final   Special Requests NONE   Final   Culture  Setup Time     Final   Value: 04/21/2013 22:11     Performed at [facility] Count     Final   Value: NO GROWTH     Performed at [facility]   Culture     Final   Value: NO GROWTH     Performed at [facility]   Report Status 04/22/2013 FINAL   Final  CLOSTRIDIUM DIFFICILE BY PCR     Status: None   Collection Time    04/22/13  9:30 AM      Result Value Range Status   C difficile by pcr NEGATIVE  NEGATIVE Final     Studies: No results found.  Scheduled Meds: . amLODipine  5 mg Oral Daily  . aspirin EC  81 mg Oral Daily  . atorvastatin  20 mg Oral q1800  . cefUROXime  500 mg Oral BID WC  .  ergocalciferol  50,000 Units Oral Q30 days  . heparin  5,000 Units Subcutaneous Q8H  . levothyroxine  125 mcg Oral QAC breakfast  . losartan  100 mg Oral Daily  . metoprolol  50 mg Oral BID  . mometasone-formoterol  2 puff Inhalation BID  . multivitamin with minerals  1 tablet Oral Daily  . pantoprazole  40 mg Oral Daily  . zinc oxide   Topical BID   Continuous Infusions: . dextrose 5 % and 0.9 % NaCl with KCl 40 mEq/L 50 mL/hr at 04/24/13 2249    Principal Problem:   Diverticulitis Active Problems:   HYPERLIPIDEMIA-MIXED   HYPERTENSION, UNSPECIFIED   AORTIC ANEURYSM   EMPHYSEMA   S/P aortic valve replacement   CKD (chronic kidney disease)   Leukocytosis   Dehydration    Time spent: > 35 minutes    Jeralyn Bennett  [facility] Pager [phone]. If 7PM-7AM, please contact night-coverage at [email or web address], password [facility] 04/25/2013, 3:48 PM  LOS: 4 days

## 2013-04-26 DIAGNOSIS — Z9289 Personal history of other medical treatment: Secondary | ICD-10-CM

## 2013-04-26 DIAGNOSIS — N179 Acute kidney failure, unspecified: Secondary | ICD-10-CM

## 2013-04-26 DIAGNOSIS — Z954 Presence of other heart-valve replacement: Secondary | ICD-10-CM

## 2013-04-26 HISTORY — DX: Personal history of other medical treatment: Z92.89

## 2013-04-26 LAB — BASIC METABOLIC PANEL
BUN: 7 mg/dL (ref 6–23)
CO2: 19 mEq/L (ref 19–32)
Calcium: 8.5 mg/dL (ref 8.4–10.5)
Chloride: 112 mEq/L (ref 96–112)
Creatinine, Ser: 0.98 mg/dL (ref 0.50–1.10)
GFR calc Af Amer: 62 mL/min — ABNORMAL LOW (ref >90)
GFR calc non Af Amer: 54 mL/min — ABNORMAL LOW (ref >90)
Glucose, Bld: 92 mg/dL (ref 70–99)
Potassium: 4.5 mEq/L (ref 3.7–5.3)
Sodium: 143 mEq/L (ref 137–147)

## 2013-04-26 LAB — CULTURE, BLOOD (ROUTINE X 2)

## 2013-04-26 LAB — CBC
HCT: 29 % — ABNORMAL LOW (ref 36.0–46.0)
Hemoglobin: 9.7 g/dL — ABNORMAL LOW (ref 12.0–15.0)
MCH: 28.4 pg (ref 26.0–34.0)
MCHC: 33.4 g/dL (ref 30.0–36.0)
MCV: 85 fL (ref 78.0–100.0)
Platelets: UNDETERMINED 10*3/uL (ref 150–400)
RBC: 3.41 MIL/uL — ABNORMAL LOW (ref 3.87–5.11)
RDW: 16.2 % — ABNORMAL HIGH (ref 11.5–15.5)
WBC: 11.7 10*3/uL — ABNORMAL HIGH (ref 4.0–10.5)

## 2013-04-26 MED ORDER — CEFUROXIME AXETIL 500 MG PO TABS: 500.0000 mg | ORAL_TABLET | Freq: Two times a day (BID) | ORAL | Status: DC

## 2013-04-26 MED ORDER — DIPHENOXYLATE-ATROPINE 2.5-0.025 MG PO TABS: 1.0000 | ORAL_TABLET | Freq: Four times a day (QID) | ORAL | Status: DC | PRN

## 2013-04-26 NOTE — Progress Notes (Signed)
   CARE MANAGEMENT NOTE 04/26/2013  Patient:  DALE, STRAUSSER   Account Number:  [id number]  Date Initiated:  04/23/2013  Documentation initiated by:  [facility]  Subjective/Objective Assessment:   Diverticulitis     Action/Plan:   discharge planning   Anticipated DC Date:  04/26/2013   Anticipated DC Plan:  [place]  CM consult      [facility] Choice  HOME HEALTH   Choice offered to / List presented to:          [facility] arranged  [place].   Status of service:  Completed, signed off Medicare Important Message given?   (If response is "NO", the following Medicare IM given date fields will be blank) Date Medicare IM given:   Date Additional Medicare IM given:    Discharge Disposition:  [place]  Per UR Regulation:  Reviewed for med. necessity/level of care/duration of stay  If discussed at [place] of Stay Meetings, dates discussed:    Comments:  04/26/13 04/23/2013  10:55 CM spoke with pt in room to offer choice for HHPT/OT.  No DME recommended and pt states she has walker at home.  Address and contact numbers verified. Referral faxed to [facility] for HHPT/OT.  No other CM needs were communicated.  Mariane Masters, BSN, [place].  [phone] UR completed. Jonnie Finner RN CCM Case Mgmt phone [phone]

## 2013-04-26 NOTE — Discharge Summary (Signed)
Physician Discharge Summary  [name] [id number] DOB: 1933/04/16 DOA: 04/21/2013  PCP: Tommy Medal, MD  Admit date: 04/21/2013 Discharge date: 04/26/2013  Time spent: 35 minutes  Recommendations for Outpatient Follow-up:  1. Please followup on CBC and BMP in 1 week. Patient developing acute kidney injury during this hospitalization.  Discharge Diagnoses:  Principal Problem:   Diverticulitis Active Problems:   HYPERLIPIDEMIA-MIXED   HYPERTENSION, UNSPECIFIED   AORTIC ANEURYSM   EMPHYSEMA   S/P aortic valve replacement   CKD (chronic kidney disease)   Leukocytosis   Dehydration   Diarrhea   Bacteremia   Discharge Condition: Stable/improved  Diet recommendation: Heart healthy diet  Filed Weights   04/21/13 1226 04/21/13 2154  Weight: 56.7 kg (125 lb) 55.1 kg (121 lb 7.6 oz)    History of present illness:  Sarah Johnson is an 80 y.o. female with hx of COPD, PE previously on coumadin, s/p bioprosthetic AVR, hypothyroidism, CKD, hyperlipidemia, SVT, presented to [facility] with 3 days hx of nausea, vomiting and diarrhea. She has no abdominal pain. She denied sorethroat, coughs, or myalgia. She was having fever up to 103, and was given tylenol. She hadn't been on antibiotics or having distant travel or ill contacts. Evaluation in the ER included a marked leukocytosis with WBC of 27K, normal LFTs and Cr of 1.6. An abdominal pelvic CT showed acute non-perforated diverticulitis and multiple diverticuli, with no abcess. Her last colonoscopy was about 2 year ago with no evidence of malignancy. Hosptialist was asked to admit her for diverticulitis.  Hospital Course:  Patient is a pleasant 80 year old female with a past medical history of chronic obstructive pulmonary disease, status post aortic valve replacement with bioprosthetic valve in 2011, who presented to the emergency department on 04/22/2013 with complaints of nausea vomiting diarrhea and fever. Initial lab work showed an  elevated white count of 27,600 with a creatinine of 1.6. CT scan of abdomen and pelvis performed on admission showed acute diverticulitis involving the distal sigmoid colon, without evidence of perforation or abscess. She was admitted to the medicine service, started on empiric IV antibiotic therapy with ciprofloxacin and Flagyl. Blood cultures drawn on admission grew Escherichia coli. While susceptibility testing was in progress, her antibiotics were changed to Primaxin and Flagyl. Susceptibility testing showed organism was pan susceptible for which she was transitioned to cefuroxime 500 mg by mouth twice a day on 04/25/2013. Her white count trended down to 11,000 as kidney function normalized by 04/25/2013. Physical therapy was consulted, patient deconditioned however was able to ambulate with assistance. Given significant clinical improvement, patient remaining afebrile over 48 hours, improvement to lab work, tolerating by mouth intake she was discharged in stable condition on 04/26/2013. Prior to discharge case manager was consulted to set patient up with home health services.   Consultations:  Physical therapy  Social work  Case manager  Discharge Exam: Filed Vitals:   04/26/13 0430  BP: 139/63  Pulse: 60  Temp: 97.4 F (36.3 C)  Resp: 18    General: Patient reports doing well, tolerating by mouth intake, feels ready to go home today. Cardiovascular: Regular rate rhythm normal S1-S2 no murmurs rubs or gallops Respiratory: Lungs are clear to auscultation bilaterally no wheezing rhonchi or rales Abdomen: Soft nontender nondistended positive bowel sounds Extremities: No edema  Discharge Instructions  Discharge Orders   Future Orders Complete By Expires   Call MD for:  difficulty breathing, headache or visual disturbances  As directed    Call MD for:  extreme  fatigue  As directed    Call MD for:  hives  As directed    Call MD for:  persistant dizziness or light-headedness  As  directed    Call MD for:  persistant nausea and vomiting  As directed    Call MD for:  severe uncontrolled pain  As directed    Call MD for:  temperature >100.4  As directed    Diet - low sodium heart healthy  As directed    Increase activity slowly  As directed        Medication List         amLODipine 5 MG tablet  Commonly known as:  NORVASC  Take 5 mg by mouth daily.     aspirin EC 81 MG tablet  Take 1 tablet (81 mg total) by mouth daily.     atorvastatin 20 MG tablet  Commonly known as:  LIPITOR  Take 20 mg by mouth daily.     Calcium-Vitamin D 600-200 MG-UNIT Caps  Take 1 tablet by mouth daily.     cefUROXime 500 MG tablet  Commonly known as:  CEFTIN  Take 1 tablet (500 mg total) by mouth 2 (two) times daily with a meal.     diphenoxylate-atropine 2.5-0.025 MG per tablet  Commonly known as:  LOMOTIL  Take 1 tablet by mouth 4 (four) times daily as needed for diarrhea or loose stools.     donepezil 5 MG tablet  Commonly known as:  ARICEPT  Take 5 mg by mouth at bedtime.     ergocalciferol 50000 UNITS capsule  Commonly known as:  VITAMIN D2  Take 50,000 Units by mouth every 30 (thirty) days.     hydrocodone-acetaminophen 5-500 MG per capsule  Commonly known as:  LORCET-HD  Take 1 capsule by mouth every 6 (six) hours as needed for pain.     ibandronate 150 MG tablet  Commonly known as:  BONIVA  Take 150 mg by mouth every 30 (thirty) days. Take in the morning with a full glass of water, on an empty stomach, and do not take anything else by mouth or lie down for the next 30 min.     losartan 100 MG tablet  Commonly known as:  COZAAR  Take 100 mg by mouth daily.     metoprolol 50 MG tablet  Commonly known as:  LOPRESSOR  Take 50 mg by mouth 2 (two) times daily.     mometasone-formoterol 100-5 MCG/ACT Aero  Commonly known as:  DULERA  Inhale 2 puffs into the lungs 2 (two) times daily. Rinse mouth after each use     multivitamin with minerals Tabs tablet   Take 1 tablet by mouth daily.     omeprazole 20 MG capsule  Commonly known as:  PRILOSEC  Take 20 mg by mouth daily.     SYNTHROID 125 MCG tablet  Generic drug:  levothyroxine  Take 125 mcg by mouth Daily.       Allergies  Allergen Reactions  . Codeine   . Penicillins Other (See Comments)    Patient and family don't recall specific rxn to PCN       Follow-up Information   Follow up with Tommy Medal, MD In 1 week.   Specialty:  Internal Medicine   Contact information:   [address], [place] [address] [phone]        The results of significant diagnostics from this hospitalization (including imaging, microbiology, ancillary and laboratory) are listed below  for reference.    Significant Diagnostic Studies: Dg Chest 2 View  04/21/2013   CLINICAL DATA:  Nausea, vomiting, diarrhea, low grade fever.  EXAM: CHEST  2 VIEW  COMPARISON:  06/04/2011  FINDINGS: Aortic knob remains prominent. Postoperative changes from sternotomy and aortic aneurysm repair. Central pulmonary vasculature remains prominent.  10 mm ill-defined ovoid opacity in the lateral left midlung zone is nonspecific. Scarring at the lung bases is unchanged. No pneumothorax or pleural effusion.  IMPRESSION: Chronic and postoperative changes.  New left midlung opacity. This may represent focal airspace disease. FOLLOW UP studies until resolution are recommended.   Electronically Signed   By: Maryclare Bean M.D.   On: 04/21/2013 17:28   Ct Abdomen Pelvis W Contrast  04/21/2013   CLINICAL DATA:  Leukocytosis with [place] blood count of [address]. Fever. Prior history of diverticulitis. Prior splenectomy during abdominal aortic aneurysm repair.  EXAM: CT ABDOMEN AND PELVIS WITH CONTRAST  TECHNIQUE: Multidetector CT imaging of the abdomen and pelvis was performed using the standard protocol following bolus administration of intravenous contrast.  CONTRAST:  88mL OMNIPAQUE IOHEXOL 300 MG/ML IV. Oral contrast  was also administered. (Reduced intravenous contrast dose per Dr. Nyoka Cowden due to the patient's creatinine of 1.6 and estimated GFR approximately 34.)  COMPARISON:  None.  FINDINGS: Edema/inflammation surrounding the distal sigmoid colon at the rectosigmoid junction in the low pelvis associated with a several cm segment of wall thickening of the distal sigmoid colon. No extraluminal gas. No abnormal fluid collection. No ascites.  Scattered diverticula involving the distal descending colon. Remainder of the colon unremarkable. Lipoma involving the ileocecal valve. Normal appendix in the right mid pelvis, filled with oral contrast. Stomach and small bowel normal in appearance.  Normal appearing liver, adrenal glands, and left kidney. Spleen surgically absent. Calcification in the mid body of the otherwise normal-appearing pancreas. Approximate 1.7 cm partially calcified cholesterol gallstone within the otherwise normal-appearing gallbladder. No biliary ductal dilation. Approximate 3.5 x 3.0 cm simple cyst arising from the upper pole of the otherwise normal-appearing right kidney.  Prior thoracoabdominal aortic aneurysm repair with haziness in the fat surrounding the distal thoracic and upper abdominal aorta, consistent with scarring. Scarring in the left subphrenic region related to the prior splenectomy. Thinning and elevation of the left hemidiaphragm without evidence of diaphragmatic hernia. Native abdominal aortic and iliofemoral atherosclerosis without aneurysm.  Bone window images demonstrate generalized osseous demineralization, degenerative disc disease at L4-5 and L5-S1, facet degenerative changes involving the lower lumbar spine including a degenerative grade 1 spondylolisthesis of L5 on S1 approximating 8 mm. Scar or atelectasis in the right middle lobe and left lower lobe. Heart enlarged.  IMPRESSION: 1. Acute diverticulitis involving the distal sigmoid colon at the rectosigmoid junction, low in the pelvis.  No evidence of perforation or abscess. 2. Scattered diverticula in the descending colon without evidence of acute diverticulitis elsewhere. 3. Isolated calcification in the mid body of the otherwise normal-appearing pancreas, query prior episode of pancreatitis. 4. Cholelithiasis without CT evidence of acute cholecystitis. 5. Right femoral hernia containing fat.   Electronically Signed   By: Evangeline Dakin M.D.   On: 04/21/2013 18:28    Microbiology: Recent Results (from the past 240 hour(s))  CULTURE, BLOOD (ROUTINE X 2)     Status: None   Collection Time    04/21/13  3:30 PM      Result Value Range Status   Specimen Description BLOOD RIGHT ARM   Final   Special Requests BOTTLES DRAWN AEROBIC  AND ANAEROBIC [facility] EACH   Final   Culture  Setup Time 04/21/2013 21:06   Final   Culture     Final   Value: ESCHERICHIA COLI     Note: Gram Stain Report Called to,Read Back By and Verified With: RACHEL LESPERANCE 04/22/13 @ 5:35PM BY RUSCOE A.   Report Status 04/24/2013 FINAL   Final   Organism ID, Bacteria ESCHERICHIA COLI   Final  CULTURE, BLOOD (ROUTINE X 2)     Status: None   Collection Time    04/21/13  3:40 PM      Result Value Range Status   Specimen Description BLOOD LEFT ARM   Final   Special Requests BOTTLES DRAWN AEROBIC AND ANAEROBIC 5CC EACH   Final   Culture  Setup Time     Final   Value: 04/21/2013 21:05     Performed at [facility]   Culture     Final   Value:        BLOOD CULTURE RECEIVED NO GROWTH TO DATE CULTURE WILL BE HELD FOR 5 DAYS BEFORE ISSUING A FINAL NEGATIVE REPORT     Performed at [facility]   Report Status PENDING   Incomplete  URINE CULTURE     Status: None   Collection Time    04/21/13  4:04 PM      Result Value Range Status   Specimen Description URINE, CLEAN CATCH   Final   Special Requests NONE   Final   Culture  Setup Time     Final   Value: 04/21/2013 22:11     Performed at [facility] Count     Final   Value: NO  GROWTH     Performed at [facility]   Culture     Final   Value: NO GROWTH     Performed at [facility]   Report Status 04/22/2013 FINAL   Final  CLOSTRIDIUM DIFFICILE BY PCR     Status: None   Collection Time    04/22/13  9:30 AM      Result Value Range Status   C difficile by pcr NEGATIVE  NEGATIVE Final     Labs: Basic Metabolic Panel:  Recent Labs Lab 04/21/13 1420 04/22/13 04/23/13 0615 04/26/13 0445  NA 134*  --  138 143  K 4.3  --  4.2 4.5  CL 96  --  108 112  CO2 23  --  21 19  GLUCOSE 111*  --  101* 92  BUN 22  --  8 7  CREATININE 1.60* 1.40* 1.18* 0.98  CALCIUM 9.5  --  7.9* 8.5   Liver Function Tests:  Recent Labs Lab 04/21/13 1420  AST 34  ALT 15  ALKPHOS 90  BILITOT 0.7  PROT 7.3  ALBUMIN 3.1*    Recent Labs Lab 04/21/13 1420  LIPASE 24   No results found for this basename: AMMONIA,  in the last 168 hours CBC:  Recent Labs Lab 04/21/13 1420 04/22/13 04/23/13 0615 04/24/13 0750 04/26/13 0445  WBC 27.6* 21.4* 14.2* 11.0* 11.7*  NEUTROABS 25.3*  --   --   --   --   HGB 11.4* 10.5* 9.4* 9.8* 9.7*  HCT 34.7* 31.2* 28.7* 29.0* 29.0*  MCV 87.6 88.1 87.2 85.3 85.0  PLT 189 169 140* PLATELET CLUMPS NOTED ON SMEAR, UNABLE TO ESTIMATE PLATELET CLUMPS NOTED ON SMEAR, UNABLE TO ESTIMATE   Cardiac Enzymes: No results found for this  basename: CKTOTAL, CKMB, CKMBINDEX, TROPONINI,  in the last 168 hours BNP: BNP (last 3 results) No results found for this basename: PROBNP,  in the last 8760 hours CBG: No results found for this basename: GLUCAP,  in the last 168 hours     Signed:  Kelvin Cellar  [facility] 04/26/2013, 11:04 AM

## 2013-04-26 NOTE — Progress Notes (Signed)
Sarah Johnson to be D/C'd Home per MD order.  Discussed with the patient and all questions fully answered.    Medication List         amLODipine 5 MG tablet  Commonly known as:  NORVASC  Take 5 mg by mouth daily.     aspirin EC 81 MG tablet  Take 1 tablet (81 mg total) by mouth daily.     atorvastatin 20 MG tablet  Commonly known as:  LIPITOR  Take 20 mg by mouth daily.     Calcium-Vitamin D 600-200 MG-UNIT Caps  Take 1 tablet by mouth daily.     cefUROXime 500 MG tablet  Commonly known as:  CEFTIN  Take 1 tablet (500 mg total) by mouth 2 (two) times daily with a meal.     diphenoxylate-atropine 2.5-0.025 MG per tablet  Commonly known as:  LOMOTIL  Take 1 tablet by mouth 4 (four) times daily as needed for diarrhea or loose stools.     donepezil 5 MG tablet  Commonly known as:  ARICEPT  Take 5 mg by mouth at bedtime.     ergocalciferol 50000 UNITS capsule  Commonly known as:  VITAMIN D2  Take 50,000 Units by mouth every 30 (thirty) days.     hydrocodone-acetaminophen 5-500 MG per capsule  Commonly known as:  LORCET-HD  Take 1 capsule by mouth every 6 (six) hours as needed for pain.     ibandronate 150 MG tablet  Commonly known as:  BONIVA  Take 150 mg by mouth every 30 (thirty) days. Take in the morning with a full glass of water, on an empty stomach, and do not take anything else by mouth or lie down for the next 30 min.     losartan 100 MG tablet  Commonly known as:  COZAAR  Take 100 mg by mouth daily.     metoprolol 50 MG tablet  Commonly known as:  LOPRESSOR  Take 50 mg by mouth 2 (two) times daily.     mometasone-formoterol 100-5 MCG/ACT Aero  Commonly known as:  DULERA  Inhale 2 puffs into the lungs 2 (two) times daily. Rinse mouth after each use     multivitamin with minerals Tabs tablet  Take 1 tablet by mouth daily.     omeprazole 20 MG capsule  Commonly known as:  PRILOSEC  Take 20 mg by mouth daily.     SYNTHROID 125 MCG tablet  Generic  drug:  levothyroxine  Take 125 mcg by mouth Daily.        VVS, Skin clean, dry and intact without evidence of skin break down, no evidence of skin tears noted. IV catheter discontinued intact. Site without signs and symptoms of complications. Dressing and pressure applied.  An After Visit Summary was printed and given to the patient. Patient escorted via [place], and D/C home via private auto.  Driggers, Allene Pyo 04/26/2013 12:24 PM

## 2013-04-27 ENCOUNTER — Other Ambulatory Visit: Payer: Self-pay | Admitting: Critical Care Medicine

## 2013-04-27 LAB — CULTURE, BLOOD (ROUTINE X 2)

## 2013-05-02 DIAGNOSIS — K5732 Diverticulitis of large intestine without perforation or abscess without bleeding: Secondary | ICD-10-CM | POA: Diagnosis not present

## 2013-05-02 DIAGNOSIS — D7289 Other specified disorders of white blood cells: Secondary | ICD-10-CM | POA: Diagnosis not present

## 2013-05-16 DIAGNOSIS — R413 Other amnesia: Secondary | ICD-10-CM | POA: Diagnosis not present

## 2013-06-05 ENCOUNTER — Other Ambulatory Visit: Payer: Self-pay | Admitting: Cardiology

## 2013-06-07 ENCOUNTER — Other Ambulatory Visit: Payer: Self-pay | Admitting: Cardiology

## 2013-06-09 ENCOUNTER — Other Ambulatory Visit: Payer: Self-pay | Admitting: Cardiology

## 2013-06-11 ENCOUNTER — Other Ambulatory Visit: Payer: Self-pay

## 2013-07-16 DIAGNOSIS — E785 Hyperlipidemia, unspecified: Secondary | ICD-10-CM | POA: Diagnosis not present

## 2013-07-16 DIAGNOSIS — E039 Hypothyroidism, unspecified: Secondary | ICD-10-CM | POA: Diagnosis not present

## 2013-07-16 DIAGNOSIS — E559 Vitamin D deficiency, unspecified: Secondary | ICD-10-CM | POA: Diagnosis not present

## 2013-07-16 DIAGNOSIS — I10 Essential (primary) hypertension: Secondary | ICD-10-CM | POA: Diagnosis not present

## 2013-07-19 ENCOUNTER — Other Ambulatory Visit: Payer: Self-pay | Admitting: Internal Medicine

## 2013-07-19 ENCOUNTER — Ambulatory Visit
Admission: RE | Admit: 2013-07-19 | Discharge: 2013-07-19 | Disposition: A | Discharge status: Home / Self Care | Payer: Medicare Other | Source: Ambulatory Visit | Attending: Internal Medicine | Admitting: Internal Medicine

## 2013-07-19 DIAGNOSIS — I10 Essential (primary) hypertension: Secondary | ICD-10-CM | POA: Diagnosis not present

## 2013-07-19 DIAGNOSIS — R404 Transient alteration of awareness: Secondary | ICD-10-CM | POA: Diagnosis not present

## 2013-07-19 DIAGNOSIS — R0602 Shortness of breath: Secondary | ICD-10-CM | POA: Diagnosis not present

## 2013-07-19 DIAGNOSIS — E860 Dehydration: Secondary | ICD-10-CM | POA: Diagnosis not present

## 2013-07-19 DIAGNOSIS — N289 Disorder of kidney and ureter, unspecified: Secondary | ICD-10-CM | POA: Diagnosis not present

## 2013-08-10 ENCOUNTER — Ambulatory Visit: Payer: Medicare Other | Admitting: Cardiology

## 2013-08-16 ENCOUNTER — Other Ambulatory Visit: Payer: Self-pay | Admitting: Cardiology

## 2013-08-20 ENCOUNTER — Ambulatory Visit (INDEPENDENT_AMBULATORY_CARE_PROVIDER_SITE_OTHER): Payer: Medicare Other | Admitting: Neurology

## 2013-08-20 ENCOUNTER — Encounter (INDEPENDENT_AMBULATORY_CARE_PROVIDER_SITE_OTHER): Payer: Self-pay

## 2013-08-20 ENCOUNTER — Encounter: Payer: Self-pay | Admitting: Neurology

## 2013-08-20 VITALS — BP 124/63 | HR 51 | Temp 97.8°F | Ht 62.0 in | Wt 125.0 lb

## 2013-08-20 DIAGNOSIS — Z9889 Other specified postprocedural states: Secondary | ICD-10-CM | POA: Diagnosis not present

## 2013-08-20 DIAGNOSIS — J4489 Other specified chronic obstructive pulmonary disease: Secondary | ICD-10-CM

## 2013-08-20 DIAGNOSIS — R413 Other amnesia: Secondary | ICD-10-CM

## 2013-08-20 DIAGNOSIS — Z954 Presence of other heart-valve replacement: Secondary | ICD-10-CM

## 2013-08-20 DIAGNOSIS — Z952 Presence of prosthetic heart valve: Secondary | ICD-10-CM

## 2013-08-20 DIAGNOSIS — J449 Chronic obstructive pulmonary disease, unspecified: Secondary | ICD-10-CM

## 2013-08-20 DIAGNOSIS — K5732 Diverticulitis of large intestine without perforation or abscess without bleeding: Secondary | ICD-10-CM | POA: Diagnosis not present

## 2013-08-20 DIAGNOSIS — Z8679 Personal history of other diseases of the circulatory system: Secondary | ICD-10-CM

## 2013-08-20 MED ORDER — DONEPEZIL HCL 10 MG PO TABS: 10.0000 mg | ORAL_TABLET | Freq: Every day | ORAL | Status: DC

## 2013-08-20 NOTE — Progress Notes (Signed)
Subjective:    Patient ID: Sarah Johnson is a 80 y.o. female.  HPI    Star Age, MD, PhD [facility] [address] [place], [place] [address]  Dear Dr. Minna Antis,  I saw your patient, Sarah Johnson, upon your kind request in my neurologic clinic today for initial consultation of her memory loss. The patient is accompanied by her husband, Josph Macho and her daughter, [name], today. As you know, Sarah Johnson is a very pleasant 80 year old right-handed woman with a complex medical history of COPD, prior PE, status post AVR (porcine), hyperlipidemia, hypothyroidism, R eye loss as infant, hypertension, vitamin D deficiency, status post aortic aneurysm repair (12/2007 and 1/10) and chronic renal insufficiency, s/p back surgery in 2013, who has had progressive memory loss for the past 2-3 years, but more progressively since her last hospitalization. She was hospitalized from 04/21/2013 through 04/26/2013 due to diverticulitis and E. Coli sepsis. She had a head CT without contrast on 07/19/2013 which showed no acute findings but was in keeping with generalized cerebral atrophy and showed white matter changes. I personally reviewed the images through the PACS system. Prior to that she had a head CT on 11/16/2012 which showed no acute findings.  She has had lack of energy, very low stamina, has had disorientation and some confusion and more sleepiness.  She primarily has been having difficulty with short-term memory such as forgetfulness, misplacing things, asking the same question again and forgetting dates and events. There is report of confusion or disorientation. Familiar faces are easily recognized. She has had issues with driving to familiar places, and therefore did not drive to the drug store She reports no recurrent headaches. There is no family history of dementia - mother lived to be 18 and had some forgetfulness and she did have a maternal aunt with dementia  in the 21s.  There is no report of Auditory Hallucinations and Visual Hallucinations and there are no delusions, such as paranoia.  She has been on generic aricept for the past 6-12 months at 5 mg, and was started on Zoloft some 6 weeks ago for lack of motivation and some anxiety. The patient denies prior TIA or stroke symptoms, such as sudden onset of one sided weakness, numbness, tingling, slurring of speech or droopy face, hearing loss, tinnitus, diplopia or visual field cut or monocular loss of vision, and denies recurrent headaches.  Of note, the patient is reported to have some snoring, and there is no report of witnessed apneas or choking sensations while asleep.   Her Past Medical History Is Significant For: Past Medical History  Diagnosis Date  . Emphysema     followed by Dr. Joya Gaskins  . S/P thoracic aortic aneurysm repair 1/10    also with bioprosthetic aortic valve prelacement [facility]). in 10/10 she has a descending thoraic aorta anuerysm and abdominal reapir [facility] Vascular urgeon is Dr. Lunette Stands.   . Pulmonary embolism 2010    She is no onger taking coumadin  . Hypothyroidism   . Blind right eye   . Hyperlipidemia   . S/P aortic valve replacement with bioprosthetic valve     ehco (8/11) with EF 55-60%, mild-mod MR, mild-mod TR, poorly visualized bioprosthetic aortic valve but no significant regurgitation and gradient not significanly elevated.    . CKD (chronic kidney disease)   . HTN (hypertension)   . History of Doppler echocardiogram     carotid dopplers. (8/11) no significant stenosis  . COPD (chronic  obstructive pulmonary disease)   . SVT (supraventricular tachycardia)     HOLTER MONITOR AFTER SURGERY   . GERD (gastroesophageal reflux disease)   . Arthritis   . Cataract     RT    Her Past Surgical History Is Significant For: Past Surgical History  Procedure Laterality Date  . Thoracoabdominal aortic aneurysm repair  01/28/09    repair  . Anomalous  pulmonary venous return repair    . Thoracic aortic aneurysm repair  1/10    VALVE ALSO DONE  . Back surgery  1970   . Eye surgery      AS BABY- RT EYE  POOR SIGHT  . Tonsillectomy    . Lumbar laminectomy/decompression microdiscectomy  05/31/2011    Procedure: LUMBAR LAMINECTOMY/DECOMPRESSION MICRODISCECTOMY;  Surgeon: Elaina Hoops, MD;  Location: [place] [facility];  Service: Neurosurgery;  Laterality: Bilateral;  Bilateral Lumbar three-four decompressionj lumbar laminectomy     Her Family History Is Significant For: Family History  Problem Relation Age of Onset  . Brain cancer Maternal Grandmother   . Coronary artery disease      no premature CAD    Her Social History Is Significant For: History   Social History  . Marital Status: Married    Spouse Name: N/A    Number of Children: N/A  . Years of Education: N/A   Social History Main Topics  . Smoking status: Former Smoker -- 1.00 packs/day for 20 years    Types: Cigarettes    Quit date: 04/26/2004  . Smokeless tobacco: Never Used     Comment: quit in 2006  . Alcohol Use: No  . Drug Use: No  . Sexual Activity: Yes    Birth Control/ Protection: Post-menopausal   Other Topics Concern  . None   Social History Narrative   Married, retired Air cabin crew at [employer]    Her Allergies Are:  Allergies  Allergen Reactions  . Codeine   . Penicillins Other (See Comments)    Patient and family don't recall specific rxn to PCN  :   Her Current Medications Are:  Outpatient Encounter Prescriptions as of 08/20/2013  Medication Sig  . amLODipine (NORVASC) 5 MG tablet Take 5 mg by mouth daily.    Marland Kitchen aspirin EC 81 MG tablet Take 1 tablet (81 mg total) by mouth daily.  Marland Kitchen atorvastatin (LIPITOR) 20 MG tablet TAKE 1 TABLET BY MOUTH EVERY DAY  . Calcium Carbonate-Vitamin D (CALCIUM-VITAMIN D) 600-200 MG-UNIT CAPS Take 1 tablet by mouth daily.  . cefUROXime (CEFTIN) 500 MG tablet Take 1 tablet (500 mg total) by mouth 2 (two) times  daily with a meal.  . diphenoxylate-atropine (LOMOTIL) 2.5-0.025 MG per tablet Take 1 tablet by mouth 4 (four) times daily as needed for diarrhea or loose stools.  . donepezil (ARICEPT) 5 MG tablet Take 5 mg by mouth at bedtime.  . DULERA 100-5 MCG/ACT AERO INHALE 2 PUFFS INTO THE LUNGS 2 (TWO) TIMES DAILY. RINSE MOUTH AFTER EACH USE  . ergocalciferol (VITAMIN D2) 50000 UNITS capsule Take 50,000 Units by mouth every 30 (thirty) days.   . hydrocodone-acetaminophen (LORCET-HD) 5-500 MG per capsule Take 1 capsule by mouth every 6 (six) hours as needed for pain.  Marland Kitchen ibandronate (BONIVA) 150 MG tablet Take 150 mg by mouth every 30 (thirty) days. Take in the morning with a full glass of water, on an empty stomach, and do not take anything else by mouth or lie down for the next 30 min.  Marland Kitchen  losartan (COZAAR) 100 MG tablet Take 100 mg by mouth daily.    . metoprolol (LOPRESSOR) 50 MG tablet Take 50 mg by mouth 2 (two) times daily.    . Multiple Vitamin (MULITIVITAMIN WITH MINERALS) TABS Take 1 tablet by mouth daily.  Marland Kitchen NAMENDA XR 28 MG CP24 Take 1 tablet by mouth daily.  Marland Kitchen omeprazole (PRILOSEC) 20 MG capsule Take 20 mg by mouth daily.   . sertraline (ZOLOFT) 50 MG tablet Take 1 tablet by mouth daily.  Marland Kitchen SYNTHROID 125 MCG tablet Take 125 mcg by mouth Daily.   :  Review of Systems:  Out of a complete 14 point review of systems, all are reviewed and negative with the exception of these symptoms as listed below:   Review of Systems  Constitutional: Positive for activity change (disinterest) and fatigue.  HENT: Positive for hearing loss.   Eyes: Negative.   Respiratory: Positive for shortness of breath and wheezing.   Cardiovascular: Negative.   Gastrointestinal: Negative.   Endocrine: Negative.   Genitourinary: Negative.   Musculoskeletal: Negative.   Skin: Negative.   Allergic/Immunologic: Negative.   Neurological:       Memory loss  Hematological: Negative.   Psychiatric/Behavioral: Positive for  confusion, sleep disturbance (e.d.s., snoring), dysphoric mood and decreased concentration. The patient is nervous/anxious.     Objective:  Neurologic Exam  Physical Exam Physical Examination:   Filed Vitals:   08/20/13 0935  BP: 124/63  Pulse: 51  Temp: 97.8 F (36.6 C)   General Examination: The patient is a very pleasant 80 y.o. female in no acute distress. She is calm and cooperative with the exam. She denies Auditory Hallucinations and Visual Hallucinations. She is well groomed and situated in a chair.   HEENT: Normocephalic, atraumatic, with the exception of damaged right eye. Left pupil is round and reactive to light and accommodation. Funduscopic exam is normal on the L with sharp disc margins noted. Extraocular tracking shows no significant saccadic breakdown on the L without nystagmus noted. Hearing is fairly intact. Tympanic membranes is clear on the left and obscured with cerumen on the right.. Face is symmetric with no facial masking and normal facial sensation. There is no lip, neck or jaw tremor. Neck is not rigid with intact passive ROM. There are no carotid bruits on auscultation. Oropharynx exam reveals mild mouth dryness. No significant airway crowding is noted. Mallampati is class II. Tongue protrudes centrally and palate elevates symmetrically.    Chest: is clear to auscultation without wheezing, rhonchi or crackles noted.  Heart: sounds are regular and normal without murmurs, rubs or gallops noted.   Abdomen: is soft, non-tender and non-distended with normal bowel sounds appreciated on auscultation.  Extremities: There is no pitting edema in the distal lower extremities bilaterally. Pedal pulses are intact.   Skin: is warm and dry with no trophic changes noted. Age-related changes are noted on the skin. Multiple small old bruises of varying ages are noted on the forearms. Of note, She is on baby ASA.  Musculoskeletal: exam reveals no obvious joint deformities,  tenderness or joint swelling or erythema. Mild changes consistent with OA of the hands are noted bilaterally.   Neurologically:  Mental status: The patient is awake and alert, paying good  attention. She is able to partially provide the history. Her daughter provides most of the Hx. She is oriented to: person, place, situation, day of week and month of year. Her memory, attention, language and knowledge are impaired mildly. There is no  aphasia, agnosia, apraxia or anomia. There is a mild degree of bradyphrenia. Speech is mildly hypophonic with no dysarthria noted. Mood is congruent and affect is blunted.  Her MMSE (Mini-Mental state exam) score is 26/30.  CDT (Clock Drawing Test) score is 4/4.  AFT (Animal Fluency Test) score is 3.  Cranial nerves are as described above under HEENT exam. In addition, shoulder shrug is normal with equal shoulder height noted.  Motor exam: Normal bulk, and strength for age is noted. Tone is not rigid with absence of cogwheeling in the bilateral extremities. There is overall no significant bradykinesia. There is no drift or rebound. There is no tremor.   Romberg is negative. Reflexes are 1+ in the upper extremities and 1+ in the lower extremities. Toes are downgoing bilaterally. Fine motor skills: Finger taps, hand movements, and rapid alternating patting are mildly impaired bilaterally. Foot taps and foot agility are very mildly impaired bilaterally.   Cerebellar testing shows no dysmetria or intention tremor on finger to nose testing. Heel to shin is unremarkable. There is no truncal or gait ataxia.   Sensory exam is intact to light touch, pinprick, vibration, temperature sense in the upper and lower extremities.   Gait, station and balance: She stands up from the seated position with no difficulty and needs no assistance. No veering to one side is noted. No leaning to one side. Posture is mildly stooped, age appropriate. Stance is narrow-based. She turns in in 3  steps. Tandem walk is not possible without help. Balance is very mildly impaired.   Assessment and Plan:   In summary, Sarah Johnson is a very pleasant 80 y.o.-year old female with a complex medical history of COPD, prior PE, status post AVR (porcine), hyperlipidemia, hypothyroidism, R eye loss as infant, hypertension, vitamin D deficiency, status post aortic aneurysm repair (12/2007 and 1/10) and chronic renal insufficiency, s/p back surgery in 2013, who has had progressive memory loss for the past 2-3 years, but more progressively since her last hospitalization. Her history and physical exam are most likely in keeping with mild dementia or mild cognitive impairment. Keeping in mind that she has been on donepezil 5 mg strength for the past few months she may have mild underlying dementia. Her history is more suggestive of dementia due to vascular disease rather than Alzheimer's disease. She does have a history of memory loss in her family in keeping with memory loss with older age. She also seems to have longevity on her mother's side of the family. Her neurological exam is nonfocal which is reassuring. On the category fluency she only scored 3 per minute. This is very low and I wonder if she has a component of depression and anxiety and inattention secondary to those underlying. I think he was at good idea to place her on an antidepressant. I explained to her that this will take some time to kick in. At this juncture, I would like to increase her Aricept to a full 10 mg strength. She has been able to tolerated well which is reassuring. I had a long chat with the patient and her family about my findings and the diagnosis of memory loss and dementia, its prognosis and treatment options. We talked about medical treatments and non-pharmacological approaches. We talked about maintaining a healthy lifestyle in general and staying active mentally and physically. I encouraged the patient to eat healthy, exercise  daily and keep well hydrated, to keep a scheduled bedtime and wake time routine, to not skip any  meals and eat healthy snacks in between meals and to have protein with every meal. I stressed the importance of regular exercise, within of course the patient's own mobility limitations. I encouraged the patient to keep up with current events by reading the news paper or watching the news and to do word puzzles, or if feasible, to go on [email or web address].   As far as further diagnostic testing is concerned, I suggested the following: no change. If she has not had an RPR checked and B12 level checked I would recommend she have it checked next time. I reviewed blood work you have done in the recent months. We can consider formal cognitive testing in the form of neuropsychological evaluation down the [place].  As far as medications are concerned, I recommended the following at this time: Increase Aricept to 10 mg once daily. I answered all their questions today and the patient and her family were in agreement with the above outlined plan. I would like to see the patient back in 3 months, sooner if the need arises and encouraged them to call with any interim questions, concerns, problems, updates and refill requests.   Thank you very much for allowing me to participate in the care of this nice patient. If I can be of any further assistance to you please do not hesitate to call me at [phone].  Sincerely,   Star Age, MD, PhD

## 2013-08-20 NOTE — Patient Instructions (Addendum)
You have complaints of memory loss: memory loss or changes in cognitive function can have many reasons and does not always mean you have dementia. Conditions that can contribute to subjective or objective memory loss include: depression, stress, poor sleep from insomnia or sleep apnea, dehydration, fluctuation in blood sugar values, thyroid or electrolyte dysfunction. Dementia can be causes by stroke, brain atherosclerosis and by Alzheimer's disease or other, more rare and sometimes hereditary causes. We will not start any new medication as yet, but I suggest that we increase the generic Aricept to 10 mg daily and I will change your prescription for this. Your memory loss is rather mild and probably in keeping with what we would call mild cognitive impairment.   I think overall you are doing fairly well but I do want to suggest a few things today:  Remember to drink plenty of fluid, eat healthy meals and do not skip any meals. Try to eat protein with a every meal and eat a healthy snack such as fruit or nuts in between meals. Try to keep a regular sleep-wake schedule and try to exercise daily, particularly in the form of walking, 10 to 20 minutes a day, if you can. Good nutrition, proper sleep and exercise can help her cognitive function.  Engage in social activities in your community and with your family and try to keep up with current events by reading the newspaper or watching the news. If you have computer and can go online, try [email or web address]. Also, you may like to do word finding puzzles or crossword puzzles.  As far as your medications are concerned, I would like to suggest: see above.   As far as diagnostic testing: no new test.   I would like to see you back in 3 to 4 months, sooner if we need to. Please call us with any interim questions, concerns, problems, updates or refill requests.  Our phone number is [phone]. We also have an after hours call service for urgent matters and there is a  physician on-call for urgent questions. For any emergencies you know to call 911 or go to the nearest emergency room.

## 2013-08-21 DIAGNOSIS — H02409 Unspecified ptosis of unspecified eyelid: Secondary | ICD-10-CM | POA: Diagnosis not present

## 2013-08-21 DIAGNOSIS — D3130 Benign neoplasm of unspecified choroid: Secondary | ICD-10-CM | POA: Diagnosis not present

## 2013-08-21 DIAGNOSIS — H18519 Endothelial corneal dystrophy, unspecified eye: Secondary | ICD-10-CM | POA: Diagnosis not present

## 2013-08-22 ENCOUNTER — Other Ambulatory Visit: Payer: Self-pay | Admitting: Cardiology

## 2013-09-04 ENCOUNTER — Other Ambulatory Visit: Payer: Self-pay | Admitting: Cardiology

## 2013-09-04 ENCOUNTER — Encounter: Payer: Self-pay | Admitting: Physician Assistant

## 2013-09-04 ENCOUNTER — Ambulatory Visit (INDEPENDENT_AMBULATORY_CARE_PROVIDER_SITE_OTHER): Payer: Medicare Other | Admitting: Physician Assistant

## 2013-09-04 ENCOUNTER — Ambulatory Visit: Payer: Medicare Other | Admitting: Physician Assistant

## 2013-09-04 VITALS — BP 110/66 | HR 49 | Ht 62.0 in | Wt 127.0 lb

## 2013-09-04 DIAGNOSIS — R001 Bradycardia, unspecified: Secondary | ICD-10-CM

## 2013-09-04 DIAGNOSIS — I10 Essential (primary) hypertension: Secondary | ICD-10-CM

## 2013-09-04 DIAGNOSIS — I498 Other specified cardiac arrhythmias: Secondary | ICD-10-CM

## 2013-09-04 DIAGNOSIS — J4489 Other specified chronic obstructive pulmonary disease: Secondary | ICD-10-CM

## 2013-09-04 DIAGNOSIS — E785 Hyperlipidemia, unspecified: Secondary | ICD-10-CM

## 2013-09-04 DIAGNOSIS — N189 Chronic kidney disease, unspecified: Secondary | ICD-10-CM

## 2013-09-04 DIAGNOSIS — I719 Aortic aneurysm of unspecified site, without rupture: Secondary | ICD-10-CM

## 2013-09-04 DIAGNOSIS — Z952 Presence of prosthetic heart valve: Secondary | ICD-10-CM

## 2013-09-04 DIAGNOSIS — Z954 Presence of other heart-valve replacement: Secondary | ICD-10-CM

## 2013-09-04 DIAGNOSIS — J449 Chronic obstructive pulmonary disease, unspecified: Secondary | ICD-10-CM

## 2013-09-04 NOTE — Patient Instructions (Addendum)
Take your aspirin every day.  Please call Dr. Judeth Porch office at [facility] to inquire about when you should follow up for your aneurysm repair.  Your physician has requested that you have an echocardiogram. Echocardiography is a painless test that uses sound waves to create images of your heart. It provides your doctor with information about the size and shape of your heart and how well your heart's chambers and valves are working. This procedure takes approximately one hour. There are no restrictions for this procedure.   Your physician recommends that you schedule a follow-up appointment in:  1 year with Dr. Loralie Champagne.

## 2013-09-04 NOTE — Progress Notes (Signed)
Sarah Johnson, Sarah Johnson, Sarah Johnson  [id number] Phone: [phone] Fax:  [phone]  Date:  09/04/2013   ID:  Sarah Johnson, DOB 1933/02/23, MRN [id number]  PCP:  Tommy Medal, MD  Cardiologist:  Dr. Loralie Champagne      History of Present Illness: Sarah Johnson is a 80 y.o. female with a hx of bioprosthetic aortic valve replacement as well as extensive aortic aneurym repair involving the ascending and descending thoracic aorta and the abdominal aorta.   Patient had her initial operation (AVR and ascending thoracic aortic aneurysm repair) in 1/10. In 10/10, she had repair of the descending thoracic and abdominal aorta. She follows closely with Dr. Lunette Stands at [facility], where she had the operations.   She has moderate COPD and is followed by Dr. Joya Gaskins.   Echo in 8/11 showed normal systolic function and the aortic valve was poorly visualized but likely ok.   Last seen by Dr. Loralie Champagne in 04/2011.  She returns for follow up.  She is doing well.  The patient denies chest pain, shortness of breath, syncope, orthopnea, PND or significant pedal edema.  She is now seeing neurology for memory issues and is taking Aricept and Namenda.   Recent Labs: 04/21/2013: ALT 15  04/22/2013: TSH 1.714  04/26/2013: Creatinine 0.98; Hemoglobin 9.7*; Potassium 4.5   Wt Readings from Last 3 Encounters:  09/04/13 127 lb (57.607 kg)  08/20/13 125 lb (56.7 kg)  04/21/13 121 lb 7.6 oz (55.1 kg)     Past Medical History:  1. COPD : Followed by Dr. Joya Gaskins.  2. Thoracic aortic aneurysm repair 1/10 also with bioprosthetic aortic valve replacement [facility]). In 10/10, she had a descending thoracic aorta aneurysm and abdominal aortic aneurysm repair [facility]). Vascular surgeon is Dr. Lunette Stands.  3. Pulmonary embolism 2010. She is no longer taking coumadin.  4. SVT: Short runs seen on prior holter.  5. Hypothyroidism  6. R eye blind  7. Hyperlipidemia  8. Bioprosthetic aortic valve:  Echo (8/11) with EF 55-60%, mild-mod MR, mild-mod TR, poorly visualized bioprosthetic aortic valve but no significant regurgitation and gradient not significantly elevated.  9. CKD  10. HTN  11. Carotid dopplers (8/11): No significant stenosis   Past Medical History  Diagnosis Date  . Emphysema     followed by Dr. Joya Gaskins  . S/P thoracic aortic aneurysm repair 1/10    also with bioprosthetic aortic valve prelacement [facility]). in 10/10 she has a descending thoraic aorta anuerysm and abdominal reapir [facility]). Vascular urgeon is Dr. Lunette Stands.   . Pulmonary embolism 2010    She is no onger taking coumadin  . Hypothyroidism   . Blind right eye   . Hyperlipidemia   . S/P aortic valve replacement with bioprosthetic valve     ehco (8/11) with EF 55-60%, mild-mod MR, mild-mod TR, poorly visualized bioprosthetic aortic valve but no significant regurgitation and gradient not significanly elevated.    . CKD (chronic kidney disease)   . HTN (hypertension)   . History of Doppler echocardiogram     carotid dopplers. (8/11) no significant stenosis  . COPD (chronic obstructive pulmonary disease)   . SVT (supraventricular tachycardia)     HOLTER MONITOR AFTER SURGERY   . GERD (gastroesophageal reflux disease)   . Arthritis   . Cataract     RT    Current Outpatient Prescriptions  Medication Sig Dispense Refill  . amLODipine (NORVASC) 5 MG tablet Take 5 mg by mouth daily.        Marland Kitchen  aspirin EC 81 MG tablet Take 1 tablet (81 mg total) by mouth daily.      Marland Kitchen atorvastatin (LIPITOR) 20 MG tablet TAKE 1 TABLET BY MOUTH EVERY DAY  15 tablet  0  . Calcium Carbonate-Vitamin D (CALCIUM-VITAMIN D) 600-200 MG-UNIT CAPS Take 1 tablet by mouth daily.      . cefUROXime (CEFTIN) 500 MG tablet Take 1 tablet (500 mg total) by mouth 2 (two) times daily with a meal.  20 tablet  0  . diphenoxylate-atropine (LOMOTIL) 2.5-0.025 MG per tablet Take 1 tablet by mouth 4 (four) times daily as needed for diarrhea or  loose stools.  30 tablet  0  . donepezil (ARICEPT) 10 MG tablet Take 1 tablet (10 mg total) by mouth at bedtime.  90 tablet  3  . DULERA 100-5 MCG/ACT AERO INHALE 2 PUFFS INTO THE LUNGS 2 (TWO) TIMES DAILY. RINSE MOUTH AFTER EACH USE  13 g  3  . ergocalciferol (VITAMIN D2) 50000 UNITS capsule Take 50,000 Units by mouth every 30 (thirty) days.       . hydrocodone-acetaminophen (LORCET-HD) 5-500 MG per capsule Take 1 capsule by mouth every 6 (six) hours as needed for pain.      Marland Kitchen ibandronate (BONIVA) 150 MG tablet Take 150 mg by mouth every 30 (thirty) days. Take in the morning with a full glass of water, on an empty stomach, and do not take anything else by mouth or lie down for the next 30 min.      Marland Kitchen losartan (COZAAR) 100 MG tablet Take 100 mg by mouth daily.        . metoprolol (LOPRESSOR) 50 MG tablet Take 50 mg by mouth 2 (two) times daily.        . Multiple Vitamin (MULITIVITAMIN WITH MINERALS) TABS Take 1 tablet by mouth daily.      Marland Kitchen NAMENDA XR 28 MG CP24 Take 1 tablet by mouth daily.      Marland Kitchen omeprazole (PRILOSEC) 20 MG capsule Take 20 mg by mouth daily.       . sertraline (ZOLOFT) 50 MG tablet Take 50 mg by mouth daily.       Marland Kitchen SYNTHROID 125 MCG tablet Take 125 mcg by mouth Daily.        No current facility-administered medications for this visit.    Allergies:   Codeine and Penicillins   Social History:  The patient  reports that she quit smoking about 9 years ago. Her smoking use included Cigarettes. She has a 20 pack-year smoking history. She has never used smokeless tobacco. She reports that she does not drink alcohol or use illicit drugs.   Family History:  The patient's family history includes Brain cancer in her maternal grandmother; Coronary artery disease in an other family member.   ROS:  Please see the history of present illness.      All other systems reviewed and negative.   PHYSICAL EXAM: VS:  BP 110/66  Pulse 49  Ht 5\' 2"  (1.575 m)  Wt 127 lb (57.607 kg)  BMI 23.22  kg/m2 Well nourished, well developed, in no acute distress HEENT: normal Neck: no JVD Cardiac:  normal S1, S2; RRR; 1/6 systolic murmurRUSB Lungs:  Decreased breath sounds bilaterally, no wheezing, rhonchi or rales Abd: soft, nontender, no hepatomegaly Ext: no edema Skin: warm and dry Neuro:  CNs 2-12 intact, no focal abnormalities noted  EKG:  Sinus brady, HR 49, normal axis, NSSTTW changes     ASSESSMENT AND PLAN:  1. AORTIC ANEURYSM:  She is s/p extensive repair at [facility] as noted.  She has not seen Dr.Kincaid in over a year.  I have encouraged her to call for follow up. 2. S/P aortic valve replacement:  It has been 4 years since her last echo.  I will arrange f/u echo.  I have reminded her to take ASA QD.  Continue SBE prophylaxis. 3. COPD:  She follows with Dr. Joya Gaskins. 4. HYPERTENSION, UNSPECIFIED:  Controlled. 5. CKD (chronic kidney disease):  Recent creatinine stable. 6. HYPERLIPIDEMIA-MIXED: Managed by PCP.  7. Bradycardia:  She is asymptomatic.  Her HR has been stable for years. She is now on Aricept in addition to Metoprolol which can slow her more.  We may need to reduce her Metoprolol dose in the future.  8. Disposition:  F/u with Dr. Loralie Champagne in 1 year.   Signed, Richardson Dopp, PA-C  09/04/2013 10:06 AM

## 2013-09-06 DIAGNOSIS — H6120 Impacted cerumen, unspecified ear: Secondary | ICD-10-CM | POA: Diagnosis not present

## 2013-09-20 ENCOUNTER — Encounter: Payer: Self-pay | Admitting: Physician Assistant

## 2013-09-20 ENCOUNTER — Ambulatory Visit (HOSPITAL_COMMUNITY): Payer: Medicare Other | Attending: Cardiovascular Disease | Admitting: Radiology

## 2013-09-20 DIAGNOSIS — I359 Nonrheumatic aortic valve disorder, unspecified: Secondary | ICD-10-CM | POA: Insufficient documentation

## 2013-09-20 DIAGNOSIS — Z952 Presence of prosthetic heart valve: Secondary | ICD-10-CM

## 2013-09-20 NOTE — Progress Notes (Signed)
Echocardiogram performed.  

## 2013-09-21 ENCOUNTER — Telehealth: Payer: Self-pay | Admitting: *Deleted

## 2013-09-21 NOTE — Telephone Encounter (Signed)
pt notified about echo results with verbal understanding 

## 2014-01-10 ENCOUNTER — Encounter (INDEPENDENT_AMBULATORY_CARE_PROVIDER_SITE_OTHER): Payer: Self-pay

## 2014-01-10 ENCOUNTER — Encounter: Payer: Self-pay | Admitting: Neurology

## 2014-01-10 ENCOUNTER — Ambulatory Visit (INDEPENDENT_AMBULATORY_CARE_PROVIDER_SITE_OTHER): Payer: Medicare Other | Admitting: Neurology

## 2014-01-10 VITALS — BP 127/65 | HR 50 | Temp 98.1°F | Ht 62.0 in | Wt 112.2 lb

## 2014-01-10 DIAGNOSIS — Z9889 Other specified postprocedural states: Secondary | ICD-10-CM | POA: Diagnosis not present

## 2014-01-10 DIAGNOSIS — R413 Other amnesia: Secondary | ICD-10-CM

## 2014-01-10 DIAGNOSIS — Z954 Presence of other heart-valve replacement: Secondary | ICD-10-CM

## 2014-01-10 DIAGNOSIS — Z952 Presence of prosthetic heart valve: Secondary | ICD-10-CM

## 2014-01-10 DIAGNOSIS — Z8679 Personal history of other diseases of the circulatory system: Secondary | ICD-10-CM

## 2014-01-10 NOTE — Patient Instructions (Signed)
I think overall you are doing fairly well but I do want to suggest a few things today:  Remember to drink plenty of fluid, eat healthy meals and do not skip any meals. Try to eat protein with a every meal and eat a healthy snack such as fruit or nuts in between meals. Try to keep a regular sleep-wake schedule and try to exercise daily, particularly in the form of walking, 20-30 minutes a day, if you can. Good nutrition, proper sleep and exercise can help her cognitive function.  Engage in social activities in your community and with your family and try to keep up with current events by reading the newspaper or watching the news. If you have computer and can go online, try [email or web address]. Also, you may like to do word finding puzzles or crossword puzzles.  As far as your medications are concerned, I would like to suggest no changes.   I would like to see you back in 6 months, sooner if we need to. Please call us with any interim questions, concerns, problems, updates or refill requests.

## 2014-01-10 NOTE — Progress Notes (Signed)
Subjective:    Patient ID: Sarah Johnson is a 80 y.o. female.  HPI    Interim history:   Ms. Sarah Johnson is a 80 year old right-handed woman with an underlying complex medical history of COPD, prior PE, status post AVR (porcine), hyperlipidemia, hypothyroidism, R eye loss as infant, hypertension, vitamin D deficiency, status post aortic aneurysm repair (12/2007 and 1/10) and chronic renal insufficiency, s/p back surgery in 2013, who presents for followup consultation of her memory loss. She is accompanied by her husband today. I first met her on 08/20/2013 at the request of her primary care physician, at which time she and her family reported a progressive memory loss for the past 2-3 years and more progressive since her last hospitalization in December 2014. Her MMSE was 26/30, clock drawing was 4 out of 4 but animal fluency was only 3 per minute at the first visit with me. She was already on Aricept 5 mg and I suggested we increase it to 10 mg strength. We also discussed potentially pursuing formal neuropsychological testing in the near future. She had been started on antidepressant recently and I suggested she continue with that. I suspected a component of anxiety and depression also underlying. I suggested she have her B12 level checked with her primary care physician next time.  Today, she reports that she feels stable. He has not noted any new problems. She still does not eat breakfast in the morning and only eats a cookie. She does not drink enough water. She has been tolerating the Aricept at 10 mg daily. She has also started taking an over-the-counter supplement for memory called Prevagen, which is I believe made of jellyfish protein. I told him I did not know much about it and I would be happy to look into it but could not tell her to take it or not take it from what I know.   She was hospitalized from 04/21/2013 through 04/26/2013 due to diverticulitis and E. Coli sepsis. She had a head CT  without contrast on 07/19/2013 which showed no acute findings but was in keeping with generalized cerebral atrophy and showed white matter changes. I personally reviewed the images through the PACS system. Prior to that she had a head CT on 11/16/2012 which showed no acute findings.   Her Past Medical History Is Significant For: Past Medical History  Diagnosis Date  . Emphysema     followed by Dr. Joya Gaskins  . S/P thoracic aortic aneurysm repair 1/10    also with bioprosthetic aortic valve prelacement [facility]). in 10/10 she has a descending thoraic aorta anuerysm and abdominal reapir [facility]). Vascular urgeon is Dr. Lunette Stands.   . Pulmonary embolism 2010    She is no onger taking coumadin  . Hypothyroidism   . Blind right eye   . Hyperlipidemia   . S/P aortic valve replacement with bioprosthetic valve     ehco (8/11) with EF 55-60%, mild-mod MR, mild-mod TR, poorly visualized bioprosthetic aortic valve but no significant regurgitation and gradient not significanly elevated.    . CKD (chronic kidney disease)   . HTN (hypertension)   . History of Doppler echocardiogram     carotid dopplers. (8/11) no significant stenosis  . COPD (chronic obstructive pulmonary disease)   . SVT (supraventricular tachycardia)     HOLTER MONITOR AFTER SURGERY   . GERD (gastroesophageal reflux disease)   . Arthritis   . Cataract     RT  . Hx of echocardiogram 2015    Echo (  08/2013):  EF 60-65%, no RWMA, Gr 1 DD, AVR ok (mean 5 mmHg), mild MR, PASP 22 mmHg    Her Past Surgical History Is Significant For: Past Surgical History  Procedure Laterality Date  . Thoracoabdominal aortic aneurysm repair  01/28/09    repair  . Anomalous pulmonary venous return repair    . Thoracic aortic aneurysm repair  1/10    VALVE ALSO DONE  . Back surgery  1970   . Eye surgery      AS BABY- RT EYE  POOR SIGHT  . Tonsillectomy    . Lumbar laminectomy/decompression microdiscectomy  05/31/2011    Procedure: LUMBAR  LAMINECTOMY/DECOMPRESSION MICRODISCECTOMY;  Surgeon: Mariam Dollar, MD;  Location: [facility];  Service: Neurosurgery;  Laterality: Bilateral;  Bilateral Lumbar three-four decompressionj lumbar laminectomy     Her Family History Is Significant For: Family History  Problem Relation Age of Onset  . Brain cancer Maternal Grandmother   . Coronary artery disease      no premature CAD    Her Social History Is Significant For: History   Social History  . Marital Status: Married    Spouse Name: N/A    Number of Children: N/A  . Years of Education: N/A   Social History Main Topics  . Smoking status: Former Smoker -- 1.00 packs/day for 20 years    Types: Cigarettes    Quit date: 04/26/2004  . Smokeless tobacco: Never Used     Comment: quit in 2006  . Alcohol Use: No  . Drug Use: No  . Sexual Activity: Yes    Birth Control/ Protection: Post-menopausal   Other Topics Concern  . 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 Codeine   . Penicillins Other (See Comments)    Patient and family don't recall specific rxn to PCN  :   Her Current Medications Are:  Outpatient Encounter Prescriptions as of 01/10/2014  Medication Sig  . amLODipine (NORVASC) 5 MG tablet Take 5 mg by mouth daily.    Marland Kitchen Apoaequorin (PREVAGEN) 10 MG CAPS Take by mouth daily. OTC  . aspirin EC 81 MG tablet Take 1 tablet (81 mg total) by mouth daily.  Marland Kitchen atorvastatin (LIPITOR) 20 MG tablet TAKE 1 TABLET BY MOUTH EVERY DAY  . Calcium Carbonate-Vitamin D (CALCIUM-VITAMIN D) 600-200 MG-UNIT CAPS Take 1 tablet by mouth daily.  . cefUROXime (CEFTIN) 500 MG tablet Take 1 tablet (500 mg total) by mouth 2 (two) times daily with a meal.  . diphenoxylate-atropine (LOMOTIL) 2.5-0.025 MG per tablet Take 1 tablet by mouth 4 (four) times daily as needed for diarrhea or loose stools.  .  donepezil (ARICEPT) 10 MG tablet Take 1 tablet (10 mg total) by mouth at bedtime.  . DULERA 100-5 MCG/ACT AERO INHALE 2 PUFFS INTO THE LUNGS 2 (TWO) TIMES DAILY. RINSE MOUTH AFTER EACH USE  . ergocalciferol (VITAMIN D2) 50000 UNITS capsule Take 50,000 Units by mouth every 30 (thirty) days.   . hydrocodone-acetaminophen (LORCET-HD) 5-500 MG per capsule Take 1 capsule by mouth every 6 (six) hours as needed for pain.  Marland Kitchen ibandronate (BONIVA) 150 MG tablet Take 150 mg by mouth every 30 (thirty) days. Take in the morning with a full glass of water, on an empty stomach, and do not take anything else by mouth or lie down for the next 30  min.  . losartan (COZAAR) 100 MG tablet Take 100 mg by mouth daily.    . metoprolol (LOPRESSOR) 50 MG tablet Take 50 mg by mouth 2 (two) times daily.    . Multiple Vitamin (MULITIVITAMIN WITH MINERALS) TABS Take 1 tablet by mouth daily.  Marland Kitchen NAMENDA XR 28 MG CP24 Take 1 tablet by mouth daily.  Marland Kitchen omeprazole (PRILOSEC) 20 MG capsule Take 20 mg by mouth daily.   . sertraline (ZOLOFT) 50 MG tablet Take 50 mg by mouth daily.   Marland Kitchen SYNTHROID 125 MCG tablet Take 125 mcg by mouth Daily.   :  Review of Systems:  Out of a complete 14 point review of systems, all are reviewed and negative with the exception of these symptoms as listed below:   Review of Systems  Neurological:       Memory loss    Objective:  Neurologic Exam  Physical Exam Physical Examination:   Filed Vitals:   01/10/14 1116  BP: 127/65  Pulse: 50  Temp: 98.1 F (36.7 C)    General Examination: The patient is a very pleasant 80 y.o. female in no acute distress. She is calm and cooperative with the exam. She denies Auditory Hallucinations and Visual Hallucinations. She is well groomed and situated in a chair.   HEENT: Normocephalic, atraumatic, with the exception of damaged right eye. Left pupil is round and reactive to light and accommodation. Funduscopic exam is normal on the L with sharp disc margins  noted. Extraocular tracking shows no significant saccadic breakdown on the L without nystagmus noted. Hearing is fairly intact. Face is symmetric with no facial masking and normal facial sensation. There is no lip, neck or jaw tremor. Neck is not rigid with intact passive ROM. There are no carotid bruits on auscultation. Oropharynx exam reveals mild mouth dryness. No significant airway crowding is noted. Mallampati is class II. Tongue protrudes centrally and palate elevates symmetrically.    Chest: is clear to auscultation without wheezing, rhonchi or crackles noted.  Heart: sounds are regular and normal without murmurs, rubs or gallops noted.   Abdomen: is soft, non-tender and non-distended with normal bowel sounds appreciated on auscultation.  Extremities: There is no pitting edema in the distal lower extremities bilaterally. Pedal pulses are intact.   Skin: is warm and dry with no trophic changes noted. Age-related changes are noted on the skin. Multiple small old bruises of varying ages are noted on the forearms. Of note, She is on baby ASA.  Musculoskeletal: exam reveals no obvious joint deformities, tenderness or joint swelling or erythema. Mild changes consistent with OA of the hands are noted bilaterally.   Neurologically:  Mental status: The patient is awake and alert, paying good  attention. She is able to partially provide the history. Her daughter provides most of the Hx. She is oriented to: person, place, situation, day of week and month of year. Her memory, attention, language and knowledge are impaired mildly. There is no aphasia, agnosia, apraxia or anomia. There is a mild degree of bradyphrenia. Speech is mildly hypophonic with no dysarthria noted. Mood is congruent and affect is blunted.  On 08/20/13: MMSE (Mini-Mental state exam) score is 26/30. CDT (Clock Drawing Test) score is 4/4. AFT (Animal Fluency Test) score is 3.  On 01/10/2014: MMSE: 25/30, CDT 4/4, AFT 8.   Cranial  nerves are as described above under HEENT exam. In addition, shoulder shrug is normal with equal shoulder height noted.  Motor exam: Normal bulk, and strength for  age is noted. Tone is not rigid with absence of cogwheeling in the bilateral extremities. There is overall no significant bradykinesia. There is no drift or rebound. There is no tremor.   Romberg is negative. Reflexes are 1+ in the upper extremities and 1+ in the lower extremities. Toes are downgoing bilaterally. Fine motor skills: Finger taps, hand movements, and rapid alternating patting are mildly impaired bilaterally. Foot taps and foot agility are very mildly impaired bilaterally.   Cerebellar testing shows no dysmetria or intention tremor on finger to nose testing. There is no truncal or gait ataxia.   Sensory exam is intact to light touch, pinprick, vibration, temperature sense in the upper and lower extremities.   Gait, station and balance: She stands up from the seated position with no difficulty and needs no assistance. No veering to one side is noted. No leaning to one side. Posture is mildly stooped, age appropriate. Stance is narrow-based. She turns in in 3 steps. Tandem walk is not possible without help. Balance is very mildly impaired.   Assessment and Plan:   In summary, NARMEEN KERPER is a very pleasant 80 year old female with a complex medical history of COPD, prior PE, status post AVR (porcine), hyperlipidemia, hypothyroidism, R eye loss as infant, hypertension, vitamin D deficiency, status post aortic aneurysm repair (12/2007 and 1/10) and chronic renal insufficiency, s/p back surgery in 2013, who has had progressive memory loss for the past 2-3 years, more progressive after a recent hospitalization. Her history and physical exam are most likely in keeping with mild dementia. She has been tolerating Aricept 10 mg. She has remained fairly stable, her category fluency is a little better in fact. Given her medical history,  she is at risk for vascular dementia rather than Alzheimer's dementia. She does have a history of memory loss in her family in keeping with memory loss with older age. I again had a long chat with the patient and her husband about my findings and the diagnosis of memory loss and dementia, its prognosis and treatment options. We talked about medical treatments and non-pharmacological approaches. We talked about maintaining a healthy lifestyle in general and staying active mentally and physically. I encouraged the patient to eat healthy, exercise daily and keep well hydrated, to keep a scheduled bedtime and wake time routine, to not skip any meals and eat healthy snacks in between meals and to have protein with every meal. I stressed the importance of regular exercise, within of course the patient's own mobility limitations. I encouraged the patient to keep up with current events by reading the news paper or watching the news and to do word puzzles, or if feasible, to go on [email or web address].   We can consider formal cognitive testing in the form of neuropsychological evaluation down the [place].  As far as medications are concerned, I recommended the following at this time: Continue Aricept to 10 mg once daily. I answered all their questions today and the patient and her husband were in agreement with the above outlined plan. I would like to see the patient back in 6 months, sooner if the need arises and encouraged them to call with any interim questions, concerns, problems, updates and refill requests.

## 2014-02-21 DIAGNOSIS — E039 Hypothyroidism, unspecified: Secondary | ICD-10-CM | POA: Diagnosis not present

## 2014-02-21 DIAGNOSIS — I10 Essential (primary) hypertension: Secondary | ICD-10-CM | POA: Diagnosis not present

## 2014-02-21 DIAGNOSIS — N390 Urinary tract infection, site not specified: Secondary | ICD-10-CM | POA: Diagnosis not present

## 2014-02-21 DIAGNOSIS — Z23 Encounter for immunization: Secondary | ICD-10-CM | POA: Diagnosis not present

## 2014-02-21 DIAGNOSIS — J449 Chronic obstructive pulmonary disease, unspecified: Secondary | ICD-10-CM | POA: Diagnosis not present

## 2014-02-21 DIAGNOSIS — M810 Age-related osteoporosis without current pathological fracture: Secondary | ICD-10-CM | POA: Diagnosis not present

## 2014-03-25 ENCOUNTER — Emergency Department (HOSPITAL_COMMUNITY): Payer: Medicare Other

## 2014-03-25 ENCOUNTER — Emergency Department (HOSPITAL_COMMUNITY)
Admission: EM | Admit: 2014-03-25 | Discharge: 2014-03-25 | Discharge status: Absent without leave | Payer: Medicare Other | Source: Home / Self Care

## 2014-03-25 ENCOUNTER — Inpatient Hospital Stay (HOSPITAL_COMMUNITY)
Admission: EM | Admit: 2014-03-25 | Discharge: 2014-03-31 | DRG: 871 | Disposition: A | Discharge status: Home / Self Care | Payer: Medicare Other | Attending: Internal Medicine | Admitting: Internal Medicine

## 2014-03-25 ENCOUNTER — Encounter (HOSPITAL_COMMUNITY): Payer: Self-pay | Admitting: Emergency Medicine

## 2014-03-25 DIAGNOSIS — F0390 Unspecified dementia without behavioral disturbance: Secondary | ICD-10-CM | POA: Diagnosis present

## 2014-03-25 DIAGNOSIS — J189 Pneumonia, unspecified organism: Secondary | ICD-10-CM | POA: Diagnosis not present

## 2014-03-25 DIAGNOSIS — J441 Chronic obstructive pulmonary disease with (acute) exacerbation: Secondary | ICD-10-CM | POA: Diagnosis present

## 2014-03-25 DIAGNOSIS — Z952 Presence of prosthetic heart valve: Secondary | ICD-10-CM | POA: Diagnosis not present

## 2014-03-25 DIAGNOSIS — H5441 Blindness, right eye, normal vision left eye: Secondary | ICD-10-CM | POA: Diagnosis present

## 2014-03-25 DIAGNOSIS — K5730 Diverticulosis of large intestine without perforation or abscess without bleeding: Secondary | ICD-10-CM | POA: Diagnosis not present

## 2014-03-25 DIAGNOSIS — A0472 Enterocolitis due to Clostridium difficile, not specified as recurrent: Secondary | ICD-10-CM | POA: Insufficient documentation

## 2014-03-25 DIAGNOSIS — A047 Enterocolitis due to Clostridium difficile: Secondary | ICD-10-CM | POA: Diagnosis present

## 2014-03-25 DIAGNOSIS — I471 Supraventricular tachycardia: Secondary | ICD-10-CM | POA: Diagnosis present

## 2014-03-25 DIAGNOSIS — N183 Chronic kidney disease, stage 3 (moderate): Secondary | ICD-10-CM

## 2014-03-25 DIAGNOSIS — Z9889 Other specified postprocedural states: Secondary | ICD-10-CM

## 2014-03-25 DIAGNOSIS — K219 Gastro-esophageal reflux disease without esophagitis: Secondary | ICD-10-CM

## 2014-03-25 DIAGNOSIS — M1990 Unspecified osteoarthritis, unspecified site: Secondary | ICD-10-CM | POA: Diagnosis present

## 2014-03-25 DIAGNOSIS — D696 Thrombocytopenia, unspecified: Secondary | ICD-10-CM | POA: Diagnosis present

## 2014-03-25 DIAGNOSIS — Z87891 Personal history of nicotine dependence: Secondary | ICD-10-CM

## 2014-03-25 DIAGNOSIS — A419 Sepsis, unspecified organism: Principal | ICD-10-CM

## 2014-03-25 DIAGNOSIS — R197 Diarrhea, unspecified: Secondary | ICD-10-CM | POA: Diagnosis not present

## 2014-03-25 DIAGNOSIS — Z7982 Long term (current) use of aspirin: Secondary | ICD-10-CM

## 2014-03-25 DIAGNOSIS — K5790 Diverticulosis of intestine, part unspecified, without perforation or abscess without bleeding: Secondary | ICD-10-CM | POA: Diagnosis present

## 2014-03-25 DIAGNOSIS — I129 Hypertensive chronic kidney disease with stage 1 through stage 4 chronic kidney disease, or unspecified chronic kidney disease: Secondary | ICD-10-CM | POA: Diagnosis present

## 2014-03-25 DIAGNOSIS — Z86711 Personal history of pulmonary embolism: Secondary | ICD-10-CM | POA: Diagnosis not present

## 2014-03-25 DIAGNOSIS — E785 Hyperlipidemia, unspecified: Secondary | ICD-10-CM | POA: Diagnosis present

## 2014-03-25 DIAGNOSIS — Z885 Allergy status to narcotic agent status: Secondary | ICD-10-CM | POA: Diagnosis not present

## 2014-03-25 DIAGNOSIS — Z880 Allergy status to penicillin: Secondary | ICD-10-CM | POA: Diagnosis not present

## 2014-03-25 DIAGNOSIS — Z6825 Body mass index (BMI) 25.0-25.9, adult: Secondary | ICD-10-CM

## 2014-03-25 DIAGNOSIS — J984 Other disorders of lung: Secondary | ICD-10-CM | POA: Diagnosis not present

## 2014-03-25 DIAGNOSIS — R509 Fever, unspecified: Secondary | ICD-10-CM | POA: Diagnosis not present

## 2014-03-25 DIAGNOSIS — E440 Moderate protein-calorie malnutrition: Secondary | ICD-10-CM | POA: Diagnosis present

## 2014-03-25 DIAGNOSIS — Z79899 Other long term (current) drug therapy: Secondary | ICD-10-CM

## 2014-03-25 DIAGNOSIS — N179 Acute kidney failure, unspecified: Secondary | ICD-10-CM | POA: Diagnosis present

## 2014-03-25 DIAGNOSIS — I10 Essential (primary) hypertension: Secondary | ICD-10-CM | POA: Diagnosis not present

## 2014-03-25 DIAGNOSIS — E039 Hypothyroidism, unspecified: Secondary | ICD-10-CM | POA: Diagnosis present

## 2014-03-25 DIAGNOSIS — K8020 Calculus of gallbladder without cholecystitis without obstruction: Secondary | ICD-10-CM | POA: Diagnosis not present

## 2014-03-25 DIAGNOSIS — R062 Wheezing: Secondary | ICD-10-CM

## 2014-03-25 DIAGNOSIS — D631 Anemia in chronic kidney disease: Secondary | ICD-10-CM | POA: Diagnosis present

## 2014-03-25 DIAGNOSIS — F17210 Nicotine dependence, cigarettes, uncomplicated: Secondary | ICD-10-CM | POA: Diagnosis not present

## 2014-03-25 LAB — CBC WITH DIFFERENTIAL/PLATELET
BASOS PCT: 0 % (ref 0–1)
Basophils Absolute: 0 10*3/uL (ref 0.0–0.1)
EOS PCT: 0 % (ref 0–5)
Eosinophils Absolute: 0 10*3/uL (ref 0.0–0.7)
HCT: 37 % (ref 36.0–46.0)
HEMOGLOBIN: 12.2 g/dL (ref 12.0–15.0)
LYMPHS ABS: 2.3 10*3/uL (ref 0.7–4.0)
Lymphocytes Relative: 8 % — ABNORMAL LOW (ref 12–46)
MCH: 29.8 pg (ref 26.0–34.0)
MCHC: 33 g/dL (ref 30.0–36.0)
MCV: 90.2 fL (ref 78.0–100.0)
Monocytes Absolute: 3.4 10*3/uL — ABNORMAL HIGH (ref 0.1–1.0)
Monocytes Relative: 12 % (ref 3–12)
Neutro Abs: 22.8 10*3/uL — ABNORMAL HIGH (ref 1.7–7.7)
Neutrophils Relative %: 80 % — ABNORMAL HIGH (ref 43–77)
Platelets: 174 10*3/uL (ref 150–400)
RBC: 4.1 MIL/uL (ref 3.87–5.11)
RDW: 15.2 % (ref 11.5–15.5)
WBC: 28.5 10*3/uL — AB (ref 4.0–10.5)

## 2014-03-25 LAB — COMPREHENSIVE METABOLIC PANEL
ALBUMIN: 3.3 g/dL — AB (ref 3.5–5.2)
ALK PHOS: 82 U/L (ref 39–117)
ALT: 10 U/L (ref 0–35)
AST: 30 U/L (ref 0–37)
Anion gap: 16 — ABNORMAL HIGH (ref 5–15)
BUN: 18 mg/dL (ref 6–23)
CO2: 20 mEq/L (ref 19–32)
Calcium: 9.3 mg/dL (ref 8.4–10.5)
Chloride: 98 mEq/L (ref 96–112)
Creatinine, Ser: 1.89 mg/dL — ABNORMAL HIGH (ref 0.50–1.10)
GFR calc Af Amer: 28 mL/min — ABNORMAL LOW (ref >90)
GFR calc non Af Amer: 24 mL/min — ABNORMAL LOW (ref >90)
Glucose, Bld: 135 mg/dL — ABNORMAL HIGH (ref 70–99)
POTASSIUM: 4.2 meq/L (ref 3.7–5.3)
SODIUM: 134 meq/L — AB (ref 137–147)
TOTAL PROTEIN: 7.7 g/dL (ref 6.0–8.3)
Total Bilirubin: 1 mg/dL (ref 0.3–1.2)

## 2014-03-25 LAB — I-STAT CG4 LACTIC ACID, ED: Lactic Acid, Venous: 1.53 mmol/L (ref 0.5–2.2)

## 2014-03-25 MED ORDER — LEVOFLOXACIN IN D5W 750 MG/150ML IV SOLN
750.0000 mg | Freq: Once | INTRAVENOUS | Status: AC
  Filled 2014-03-25: qty 150

## 2014-03-25 MED ORDER — ACETAMINOPHEN 325 MG PO TABS
650.0000 mg | ORAL_TABLET | Freq: Once | ORAL | Status: AC
  Administered 2014-03-26: 650 mg via ORAL
  Filled 2014-03-25: qty 2

## 2014-03-25 MED ORDER — IOHEXOL 300 MG/ML  SOLN: 50.0000 mL | Freq: Once | INTRAMUSCULAR | Status: AC | PRN

## 2014-03-25 MED ORDER — METRONIDAZOLE IN NACL 5-0.79 MG/ML-% IV SOLN
500.0000 mg | Freq: Three times a day (TID) | INTRAVENOUS | Status: DC
  Administered 2014-03-26 – 2014-03-28 (×6): 500 mg via INTRAVENOUS
  Filled 2014-03-25 (×8): qty 100

## 2014-03-25 MED ORDER — LEVOFLOXACIN IN D5W 750 MG/150ML IV SOLN
750.0000 mg | Freq: Once | INTRAVENOUS | Status: AC
  Administered 2014-03-25: 750 mg via INTRAVENOUS
  Filled 2014-03-25: qty 150

## 2014-03-25 MED ORDER — METRONIDAZOLE IN NACL 5-0.79 MG/ML-% IV SOLN
500.0000 mg | Freq: Once | INTRAVENOUS | Status: AC
  Administered 2014-03-26: 500 mg via INTRAVENOUS
  Filled 2014-03-25: qty 100

## 2014-03-25 MED ORDER — SODIUM CHLORIDE 0.9 % IV BOLUS (SEPSIS)
1000.0000 mL | INTRAVENOUS | Status: AC
  Administered 2014-03-25 (×2): 1000 mL via INTRAVENOUS

## 2014-03-25 NOTE — Progress Notes (Signed)
ANTIBIOTIC CONSULT NOTE - INITIAL  Pharmacy Consult for metronidazole and levofloxacin Indication: intra-abdominal infection  Allergies  Allergen Reactions  . Codeine   . Penicillins Other (See Comments)    Patient and family don't recall specific rxn to PCN    Patient Measurements: Weight= 50.9kg on 01/10/14 IBW=54.6kg  Vital Signs: Temp: 99.2 F (37.3 C) (11/30 2014) Temp Source: Oral (11/30 2014) BP: 106/54 mmHg (11/30 2014) Pulse Rate: 96 (11/30 2014)  Labs:  Recent Labs  03/25/14 2033  WBC 28.5*  HGB 12.2  PLT 174    Medical History: Past Medical History  Diagnosis Date  . Emphysema     followed by Dr. Joya Gaskins  . S/P thoracic aortic aneurysm repair 1/10    also with bioprosthetic aortic valve prelacement [facility]). in 10/10 she has a descending thoraic aorta anuerysm and abdominal reapir [facility]). Vascular urgeon is Dr. Lunette Stands.   . Pulmonary embolism 2010    She is no onger taking coumadin  . Hypothyroidism   . Blind right eye   . Hyperlipidemia   . S/P aortic valve replacement with bioprosthetic valve     ehco (8/11) with EF 55-60%, mild-mod MR, mild-mod TR, poorly visualized bioprosthetic aortic valve but no significant regurgitation and gradient not significanly elevated.    . CKD (chronic kidney disease)   . HTN (hypertension)   . History of Doppler echocardiogram     carotid dopplers. (8/11) no significant stenosis  . COPD (chronic obstructive pulmonary disease)   . SVT (supraventricular tachycardia)     HOLTER MONITOR AFTER SURGERY   . GERD (gastroesophageal reflux disease)   . Arthritis   . Cataract     RT  . Hx of echocardiogram 2015    Echo (08/2013):  EF 60-65%, no RWMA, Gr 1 DD, AVR ok (mean 5 mmHg), mild MR, PASP 22 mmHg    Medications:  Scheduled:   Infusions:  . levofloxacin (LEVAQUIN) IV    . metronidazole    . sodium chloride     Assessment: 53 yoF admitted 11/30 with fever and diarrhea. Pt states she has Hx of  diverticulitis and has been septic about 1 year prior. CT of the abdomen has been ordered but not yet performed. Pharmacy is consulted to dose metronidazole and levofloxacin for intra-abdominal infection.  First doses ordered in the ED  Antiinfectives  11/30 >> metronidazole >> 11/30 >> levofloxacin >>    Labs / vitals Tmax: 99.2 WBCs: 28.5k Renal: IP  Microbiology 11/30 blood x2: ordered 11/30 urine: ordered    Goal of Therapy:  levofloxacin and metronidazole per indication and renal function  Plan:  - metronidazole 500mg  IV q8h - levofloxacin 750mg  IV x1 as ordered - will follow-up labs for continued levofloxacin dosing - follow-up clinical course, culture results, renal function - follow-up antibiotic de-escalation and length of therapy  Thank you for the consult.  Currie Paris, PharmD, BCPS Pager: [phone] Pharmacy: [phone] 03/25/2014 9:25 PM

## 2014-03-25 NOTE — ED Notes (Signed)
Patient states she feels weak.

## 2014-03-25 NOTE — ED Notes (Signed)
Patient ambulated to restroom and back to bed. She attempted to urinate but was unable to urinate. Will try again after giving IV fluids.

## 2014-03-25 NOTE — ED Provider Notes (Signed)
CSN: [id number]     Arrival date & time 03/25/14  1952 History   First MD Initiated Contact with Patient 03/25/14 2041     Chief Complaint  Patient presents with  . Fever  . Diarrhea   Patient is a 80 y.o. female presenting with fever and diarrhea. The history is provided by the patient.  Fever Max temp prior to arrival:  101.8 Severity:  Moderate Onset quality:  Gradual Duration:  1 day Timing:  Constant Relieved by:  Nothing Associated symptoms: chills, diarrhea, nausea and vomiting   Diarrhea Associated symptoms: chills, fever and vomiting   She had similar symptoms in the past about a year ago and she was diagnosed with diverticulitis and sepsis.  When she had another temperature today she called the doctor's office and was told to come to the ED.  She is not having any pain but she did not have any the last time as well.  No recent abx.  No recent travel.  Past Medical History  Diagnosis Date  . Emphysema     followed by Dr. Joya Gaskins  . S/P thoracic aortic aneurysm repair 1/10    also with bioprosthetic aortic valve prelacement [facility]). in 10/10 she has a descending thoraic aorta anuerysm and abdominal reapir [facility]). Vascular urgeon is Dr. Lunette Stands.   . Pulmonary embolism 2010    She is no onger taking coumadin  . Hypothyroidism   . Blind right eye   . Hyperlipidemia   . S/P aortic valve replacement with bioprosthetic valve     ehco (8/11) with EF 55-60%, mild-mod MR, mild-mod TR, poorly visualized bioprosthetic aortic valve but no significant regurgitation and gradient not significanly elevated.    . CKD (chronic kidney disease)   . HTN (hypertension)   . History of Doppler echocardiogram     carotid dopplers. (8/11) no significant stenosis  . COPD (chronic obstructive pulmonary disease)   . SVT (supraventricular tachycardia)     HOLTER MONITOR AFTER SURGERY   . GERD (gastroesophageal reflux disease)   . Arthritis   . Cataract     RT  . Hx of  echocardiogram 2015    Echo (08/2013):  EF 60-65%, no RWMA, Gr 1 DD, AVR ok (mean 5 mmHg), mild MR, PASP 22 mmHg   Past Surgical History  Procedure Laterality Date  . Thoracoabdominal aortic aneurysm repair  01/28/09    repair  . Anomalous pulmonary venous return repair    . Thoracic aortic aneurysm repair  1/10    VALVE ALSO DONE  . Back surgery  1970   . Eye surgery      AS BABY- RT EYE  POOR SIGHT  . Tonsillectomy    . Lumbar laminectomy/decompression microdiscectomy  05/31/2011    Procedure: LUMBAR LAMINECTOMY/DECOMPRESSION MICRODISCECTOMY;  Surgeon: Elaina Hoops, MD;  Location: [place] [facility];  Service: Neurosurgery;  Laterality: Bilateral;  Bilateral Lumbar three-four decompressionj lumbar laminectomy    Family History  Problem Relation Age of Onset  . Brain cancer Maternal Grandmother   . Coronary artery disease      no premature CAD   History  Substance Use Topics  . Smoking status: Former Smoker -- 1.00 packs/day for 20 years    Types: Cigarettes    Quit date: 04/26/2004  . Smokeless tobacco: Never Used     Comment: quit in 2006  . Alcohol Use: No   OB History    No data available     Review of Systems  Constitutional: Positive for fever and chills.  Gastrointestinal: Positive for nausea, vomiting and diarrhea.  All other systems reviewed and are negative.     Allergies  Codeine and Penicillins  Home Medications   Prior to Admission medications   Medication Sig Start Date End Date Taking? Authorizing Provider  amLODipine (NORVASC) 5 MG tablet Take 5 mg by mouth daily.     Yes Historical Provider, MD  aspirin EC 81 MG tablet Take 1 tablet (81 mg total) by mouth daily. 05/04/11  Yes Larey Dresser, MD  atorvastatin (LIPITOR) 20 MG tablet TAKE 1 TABLET BY MOUTH EVERY DAY   Yes Larey Dresser, MD  Calcium Carbonate-Vitamin D (CALCIUM-VITAMIN D) 600-200 MG-UNIT CAPS Take 1 tablet by mouth daily.   Yes Historical Provider, MD  donepezil (ARICEPT) 10 MG tablet Take  1 tablet (10 mg total) by mouth at bedtime. 08/20/13  Yes Star Age, MD  DULERA 100-5 MCG/ACT AERO INHALE 2 PUFFS INTO THE LUNGS 2 (TWO) TIMES DAILY. RINSE MOUTH AFTER EACH USE 04/27/13  Yes Elsie Stain, MD  ibandronate (BONIVA) 150 MG tablet Take 150 mg by mouth every 30 (thirty) days. Take in the morning with a full glass of water, on an empty stomach, and do not take anything else by mouth or lie down for the next 30 min.   Yes Historical Provider, MD  losartan (COZAAR) 100 MG tablet Take 100 mg by mouth daily.     Yes Historical Provider, MD  metoprolol (LOPRESSOR) 50 MG tablet Take 50 mg by mouth 2 (two) times daily.     Yes Historical Provider, MD  mometasone-formoterol (DULERA) 100-5 MCG/ACT AERO Inhale 2 puffs into the lungs 2 (two) times daily as needed for wheezing.   Yes Historical Provider, MD  Multiple Vitamin (MULITIVITAMIN WITH MINERALS) TABS Take 1 tablet by mouth daily.   Yes Historical Provider, MD  NAMENDA XR 28 MG CP24 Take 1 tablet by mouth daily. 07/17/13  Yes Historical Provider, MD  omeprazole (PRILOSEC) 20 MG capsule Take 20 mg by mouth daily.    Yes Historical Provider, MD  sertraline (ZOLOFT) 50 MG tablet Take 25 mg by mouth daily.  07/19/13  Yes Historical Provider, MD  SYNTHROID 125 MCG tablet Take 125 mcg by mouth Daily.  02/23/11  Yes Historical Provider, MD  cefUROXime (CEFTIN) 500 MG tablet Take 1 tablet (500 mg total) by mouth 2 (two) times daily with a meal. 04/26/13   Kelvin Cellar, MD  diphenoxylate-atropine (LOMOTIL) 2.5-0.025 MG per tablet Take 1 tablet by mouth 4 (four) times daily as needed for diarrhea or loose stools. 04/26/13   Kelvin Cellar, MD  hydrocodone-acetaminophen (LORCET-HD) 5-500 MG per capsule Take 1 capsule by mouth every 6 (six) hours as needed for pain.    Historical Provider, MD   BP 122/58 mmHg  Pulse 92  Temp(Src) 100.3 F (37.9 C) (Rectal)  Resp 24  Ht 5\' 2"  (1.575 m)  Wt 134 lb (60.782 kg)  BMI 24.50 kg/m2  SpO2 92% Physical  Exam  Constitutional: No distress.  HENT:  Head: Normocephalic and atraumatic.  Right Ear: External ear normal.  Left Ear: External ear normal.  Mouth/Throat: No oropharyngeal exudate.  Eyes: Conjunctivae are normal. Right eye exhibits no discharge. Left eye exhibits no discharge. No scleral icterus.  Neck: Neck supple. No tracheal deviation present.  Cardiovascular: Normal rate, regular rhythm and intact distal pulses.   Pulmonary/Chest: Effort normal. No stridor. No respiratory distress. She has wheezes. She has rales.  Abdominal: Soft. Bowel sounds are normal. She exhibits no distension. There is no tenderness. There is no rebound and no guarding.  Musculoskeletal: She exhibits no edema or tenderness.  Neurological: She is alert. She has normal strength. No cranial nerve deficit (no facial droop, extraocular movements intact, no slurred speech) or sensory deficit. She exhibits normal muscle tone. She displays no seizure activity. Coordination normal.  Skin: Skin is warm and dry. No rash noted.  Psychiatric: She has a normal mood and affect.  Nursing note and vitals reviewed.   ED Course  Procedures (including critical care time) Labs Review Labs Reviewed  CBC WITH DIFFERENTIAL - Abnormal; Notable for the following:    WBC 28.5 (*)    Neutrophils Relative % 80 (*)    Lymphocytes Relative 8 (*)    Neutro Abs 22.8 (*)    Monocytes Absolute 3.4 (*)    All other components within normal limits  COMPREHENSIVE METABOLIC PANEL - Abnormal; Notable for the following:    Sodium 134 (*)    Glucose, Bld 135 (*)    Creatinine, Ser 1.89 (*)    Albumin 3.3 (*)    GFR calc non Af Amer 24 (*)    GFR calc Af Amer 28 (*)    Anion gap 16 (*)    All other components within normal limits  URINE CULTURE  CULTURE, BLOOD (ROUTINE X 2)  CULTURE, BLOOD (ROUTINE X 2)  URINALYSIS, ROUTINE W REFLEX MICROSCOPIC  I-STAT CG4 LACTIC ACID, ED    Imaging Review Ct Abdomen Pelvis Wo  Contrast  03/25/2014   CLINICAL DATA:  80 year old female with fever and weakness. Initial encounter.  EXAM: CT ABDOMEN AND PELVIS WITHOUT CONTRAST  TECHNIQUE: Multidetector CT imaging of the abdomen and pelvis was performed following the standard protocol without IV contrast.  COMPARISON:  04/21/2013 and prior CTs  FINDINGS: Patchy right middle lobe opacity may represent infection/ pneumonia, including more atypical etiologies.  Mild left basilar atelectasis/scarring is present.  The liver, pancreas and adrenal glands are unremarkable.  Cholelithiasis noted without CT evidence of acute cholecystitis.  Mild bilateral renal atrophy, left greater than right, noted as well as a right renal cyst.  Aortic aneurysm repair again noted without change.  The spleen is not visualized -questions splenectomy.  Please note that parenchymal abnormalities may be missed without intravenous contrast.  There is no evidence of free fluid, enlarged lymph nodes or biliary dilatation.  There is no evidence of bowel obstruction, pneumoperitoneum or abscess.  A small supraumbilical hernia containing fat and large right inguinal hernia containing fat again noted.  No acute or suspicious bony abnormalities are identified. Degenerative changes within the lumbar spine again noted.  IMPRESSION: Patchy right middle lobe opacities suspicious for infection/pneumonia, including atypical mycobacterium.  No evidence of acute abnormality within the abdomen or pelvis.  Colonic diverticulosis without diverticulitis.  Cholelithiasis without CT evidence of acute cholecystitis.  Unchanged evidence of aortic repair.  Supraumbilical and right inguinal hernias containing fat.   Electronically Signed   By: Hassan Rowan M.D.   On: 03/25/2014 22:41   Dg Chest Port 1 View  03/25/2014   CLINICAL DATA:  Fever and diarrhea.  EXAM: PORTABLE CHEST - 1 VIEW  COMPARISON:  04/21/2013  FINDINGS: New airspace densities in the right mid lung. There may be chronic densities  or calcifications along the lateral left chest. There is concern for new airspace disease in left lower lung region. Upper lungs are clear. Heart size is within normal limits.  There are median sternotomy wires.  IMPRESSION: Concern for bilateral airspace disease, right side greater the left. Findings could represent pneumonia. Recommend follow up to ensure resolution.   Electronically Signed   By: Markus Daft M.D.   On: 03/25/2014 21:51    Medications  sodium chloride 0.9 % bolus 1,000 mL (1,000 mLs Intravenous New Bag/Given 03/25/14 2254)  metroNIDAZOLE (FLAGYL) IVPB 500 mg (not administered)  iohexol (OMNIPAQUE) 300 MG/ML solution 50 mL (not administered)  metroNIDAZOLE (FLAGYL) IVPB 500 mg (not administered)  levofloxacin (LEVAQUIN) IVPB 750 mg (not administered)  levofloxacin (LEVAQUIN) IVPB 750 mg (750 mg Intravenous New Bag/Given 03/25/14 2145)     MDM   Final diagnoses:  Fever  CAP (community acquired pneumonia)    CXR and CT scan suggest CAP.  No evidence of diverticulitis on CT scan.  IV abx started in the ED.  Plan on admission for further treatment.   Dorie Rank, MD 03/25/14 [phone]

## 2014-03-25 NOTE — H&P (Addendum)
History and Physical:    Sarah Johnson:[id number] DOB: 02/17/1933 DOA: 03/25/2014  Referring physician: Dr. Dorie Rank PCP: Tommy Medal, MD   Chief Complaint: Fever and diarrhea  History of Present Illness:   Sarah Johnson is an 80 y.o. female with a PMH of emphysema, PE (no longer on Coumadin), COPD, status post porcine AVR, hypertension, hyperlipidemia, hypothyroidism, chronic kidney disease who was brought to the ER by her daughters for evaluation of fever and lethargy.  The patient had an episode of sepsis caused by diverticulitis last year, and the patient's daughters were concerned that she was developing this again as the symptoms were similar.  In the ER, she has been incontinent of loose stools.  The patient's daughters report that she has had a problem with fecal incontinence since last year when she was diagnosed with diverticulitis.  Over the past 24 hours, the patient has developed malaise, fever, and anorexia.  She was treated for a UTI one month ago.    ROS:   Constitutional: + fever, no chills;  Appetite diminished; No weight loss, no weight gain, + fatigue.  HEENT: No blurry vision, no diplopia, no pharyngitis, no dysphagia CV: No chest pain, no palpitations, no PND, no orthopnea, no edema.  Resp: + SOB, no cough, no pleuritic pain. GI: + nausea, no vomiting, + diarrhea, no melena, no hematochezia, no constipation, no abdominal pain.  GU: No dysuria, no hematuria, no frequency, no urgency. MSK: no myalgias, no arthralgias.  Neuro:  No headache, no focal neurological deficits, no history of seizures.  Psych: + depression, no anxiety.  Endo: No heat intolerance, no cold intolerance, no polyuria, no polydipsia  Skin: No rashes, no skin lesions.  Heme: No easy bruising.  Travel history: No recent travel.   Past Medical History:   Past Medical History  Diagnosis Date  . Emphysema     followed by Dr. Joya Gaskins  . S/P thoracic aortic aneurysm repair 1/10    also with  bioprosthetic aortic valve prelacement [facility]). in 10/10 she has a descending thoraic aorta anuerysm and abdominal reapir [facility]). Vascular urgeon is Dr. Lunette Stands.   . Pulmonary embolism 2010    She is no onger taking coumadin  . Hypothyroidism   . Blind right eye   . Hyperlipidemia   . S/P aortic valve replacement with bioprosthetic valve     ehco (8/11) with EF 55-60%, mild-mod MR, mild-mod TR, poorly visualized bioprosthetic aortic valve but no significant regurgitation and gradient not significanly elevated.    . CKD (chronic kidney disease)   . HTN (hypertension)   . History of Doppler echocardiogram     carotid dopplers. (8/11) no significant stenosis  . COPD (chronic obstructive pulmonary disease)   . SVT (supraventricular tachycardia)     HOLTER MONITOR AFTER SURGERY   . GERD (gastroesophageal reflux disease)   . Arthritis   . Cataract     RT  . Hx of echocardiogram 2015    Echo (08/2013):  EF 60-65%, no RWMA, Gr 1 DD, AVR ok (mean 5 mmHg), mild MR, PASP 22 mmHg    Past Surgical History:   Past Surgical History  Procedure Laterality Date  . Thoracoabdominal aortic aneurysm repair  01/28/09    repair  . Anomalous pulmonary venous return repair    . Thoracic aortic aneurysm repair  1/10    VALVE ALSO DONE  . Back surgery  1970   . Eye surgery      AS  BABY- RT EYE  POOR SIGHT  . Tonsillectomy    . Lumbar laminectomy/decompression microdiscectomy  05/31/2011    Procedure: LUMBAR LAMINECTOMY/DECOMPRESSION MICRODISCECTOMY;  Surgeon: Elaina Hoops, MD;  Location: [place] [facility];  Service: Neurosurgery;  Laterality: Bilateral;  Bilateral Lumbar three-four decompressionj lumbar laminectomy     Social History:   History   Social History  . Marital Status: Married    Spouse Name: N/A    Number of Children: 3  . Years of Education: N/A   Occupational History  . Retired    Social History Main Topics  . Smoking status: Former Smoker -- 1.00 packs/day for 20 years      Types: Cigarettes    Quit date: 04/26/2004  . Smokeless tobacco: Never Used     Comment: quit in 2006  . Alcohol Use: No  . Drug Use: No  . Sexual Activity: Yes    Birth Control/ Protection: Post-menopausal   Other Topics Concern  . 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 Brain cancer Maternal Grandmother   . Coronary artery disease      no premature CAD    Allergies   Codeine and Penicillins  Current Medications:   Prior to Admission medications   Medication Sig Start Date End Date Taking? Authorizing Provider  amLODipine (NORVASC) 5 MG tablet Take 5 mg by mouth daily.     Yes Historical Provider, MD  aspirin EC 81 MG tablet Take 1 tablet (81 mg total) by mouth daily. 05/04/11  Yes Larey Dresser, MD  atorvastatin (LIPITOR) 20 MG tablet TAKE 1 TABLET BY MOUTH EVERY DAY   Yes Larey Dresser, MD  Calcium Carbonate-Vitamin D (CALCIUM-VITAMIN D) 600-200 MG-UNIT CAPS Take 1 tablet by mouth daily.   Yes Historical Provider, MD  donepezil (ARICEPT) 10 MG tablet Take 1 tablet (10 mg total) by mouth at bedtime. 08/20/13  Yes Star Age, MD  DULERA 100-5 MCG/ACT AERO INHALE 2 PUFFS INTO THE LUNGS 2 (TWO) TIMES DAILY. RINSE MOUTH AFTER EACH USE 04/27/13  Yes Elsie Stain, MD  ibandronate (BONIVA) 150 MG tablet Take 150 mg by mouth every 30 (thirty) days. Take in the morning with a full glass of water, on an empty stomach, and do not take anything else by mouth or lie down for the next 30 min.   Yes Historical Provider, MD  losartan (COZAAR) 100 MG tablet Take 100 mg by mouth daily.     Yes Historical Provider, MD  metoprolol (LOPRESSOR) 50 MG tablet Take 50 mg by mouth 2 (two) times daily.     Yes Historical Provider, MD  mometasone-formoterol (DULERA) 100-5 MCG/ACT AERO Inhale 2 puffs into the lungs 2  (two) times daily as needed for wheezing.   Yes Historical Provider, MD  Multiple Vitamin (MULITIVITAMIN WITH MINERALS) TABS Take 1 tablet by mouth daily.   Yes Historical Provider, MD  NAMENDA XR 28 MG CP24 Take 1 tablet by mouth daily. 07/17/13  Yes Historical Provider, MD  omeprazole (PRILOSEC) 20 MG capsule Take 20 mg by mouth daily.    Yes Historical Provider, MD  sertraline (ZOLOFT) 50 MG tablet Take 25 mg by mouth daily.  07/19/13  Yes Historical Provider, MD  SYNTHROID 125 MCG tablet Take 125 mcg by mouth Daily.  02/23/11  Yes Historical Provider, MD  cefUROXime (CEFTIN) 500 MG tablet Take 1 tablet (500 mg total) by mouth 2 (two) times daily with a meal. 04/26/13   Kelvin Cellar, MD  diphenoxylate-atropine (LOMOTIL) 2.5-0.025 MG per tablet Take 1 tablet by mouth 4 (four) times daily as needed for diarrhea or loose stools. 04/26/13   Kelvin Cellar, MD  hydrocodone-acetaminophen (LORCET-HD) 5-500 MG per capsule Take 1 capsule by mouth every 6 (six) hours as needed for pain.    Historical Provider, MD    Physical Exam:   Filed Vitals:   03/25/14 2148 03/25/14 2159 03/25/14 2250 03/25/14 2256  BP:  127/90 122/58   Pulse:  78 92   Temp:  98.6 F (37 C) 97.5 F (36.4 C) 100.3 F (37.9 C)  TempSrc:  Oral Oral Rectal  Resp:  20 24   Height: 5\' 2"  (1.575 m)     Weight: 60.782 kg (134 lb)     SpO2:  99% 92%      Physical Exam: Blood pressure 122/58, pulse 92, temperature 100.3 F (37.9 C), temperature source Rectal, resp. rate 24, height 5\' 2"  (1.575 m), weight 60.782 kg (134 lb), SpO2 92 %. Gen: Moderate distress from increased work of breathing. Head: Normocephalic, atraumatic. Eyes: PERRL, EOMI, sclerae nonicteric. Mouth: Oropharynx with dry mucous membranes. Neck: Supple, no thyromegaly, no lymphadenopathy, no jugular venous distention. Chest: Lungs diminished with expiratory wheezes and increased work of breathing. CV: Heart sounds are regular, no murmurs, rubs, or  gallops. Abdomen: Soft, nontender, slightly distended with normal active bowel sounds. Extremities: Extremities are without clubbing, edema, or cyanosis. Skin: Warm and dry. Neuro: Alert and oriented times 2; cranial nerves II through XII grossly intact. Psych: Mood and affect flat/depressed.   Data Review:    Labs: Basic Metabolic Panel:  Recent Labs Lab 03/25/14 2033  NA 134*  K 4.2  CL 98  CO2 20  GLUCOSE 135*  BUN 18  CREATININE 1.89*  CALCIUM 9.3   Liver Function Tests:  Recent Labs Lab 03/25/14 2033  AST 30  ALT 10  ALKPHOS 82  BILITOT 1.0  PROT 7.7  ALBUMIN 3.3*   CBC:  Recent Labs Lab 03/25/14 2033  WBC 28.5*  NEUTROABS 22.8*  HGB 12.2  HCT 37.0  MCV 90.2  PLT 174    Radiographic Studies: Ct Abdomen Pelvis Wo Contrast  03/25/2014   CLINICAL DATA:  80 year old female with fever and weakness. Initial encounter.  EXAM: CT ABDOMEN AND PELVIS WITHOUT CONTRAST  TECHNIQUE: Multidetector CT imaging of the abdomen and pelvis was performed following the standard protocol without IV contrast.  COMPARISON:  04/21/2013 and prior CTs  FINDINGS: Patchy right middle lobe opacity may represent infection/ pneumonia, including more atypical etiologies.  Mild left basilar atelectasis/scarring is present.  The liver, pancreas and adrenal glands are unremarkable.  Cholelithiasis noted without CT evidence of acute cholecystitis.  Mild bilateral renal atrophy, left greater than right, noted as well as a right renal cyst.  Aortic aneurysm repair again noted without change.  The spleen is not visualized -questions splenectomy.  Please note that parenchymal abnormalities may be missed without intravenous contrast.  There is no evidence of free fluid, enlarged lymph nodes or biliary dilatation.  There is no evidence of bowel obstruction, pneumoperitoneum or abscess.  A small supraumbilical hernia containing fat and large right inguinal hernia containing fat again noted.  No acute or  suspicious bony abnormalities are identified. Degenerative changes within the lumbar spine again noted.  IMPRESSION: Patchy  right middle lobe opacities suspicious for infection/pneumonia, including atypical mycobacterium.  No evidence of acute abnormality within the abdomen or pelvis.  Colonic diverticulosis without diverticulitis.  Cholelithiasis without CT evidence of acute cholecystitis.  Unchanged evidence of aortic repair.  Supraumbilical and right inguinal hernias containing fat.   Electronically Signed   By: Hassan Rowan M.D.   On: 03/25/2014 22:41   Dg Chest Port 1 View  03/25/2014   CLINICAL DATA:  Fever and diarrhea.  EXAM: PORTABLE CHEST - 1 VIEW  COMPARISON:  04/21/2013  FINDINGS: New airspace densities in the right mid lung. There may be chronic densities or calcifications along the lateral left chest. There is concern for new airspace disease in left lower lung region. Upper lungs are clear. Heart size is within normal limits. There are median sternotomy wires.  IMPRESSION: Concern for bilateral airspace disease, right side greater the left. Findings could represent pneumonia. Recommend follow up to ensure resolution.   Electronically Signed   By: Markus Daft M.D.   On: 03/25/2014 21:51    Assessment/Plan:   Principal Problem:   Sepsis from community-acquired pneumonia, rule out Clostridium difficile infection  Although the patient's principal complaint on admission was fever and diarrhea, she was found to have a bilateral pneumonia on radiography. She also has increased work of breathing and worsening dyspnea consistent with pneumonia, but given recent treatment with antibiotics for a urinary tract infection, Clostridium difficile colitis must be ruled out, especially given her significant leukocytosis.  Admitted to the stepdown unit given her increased work of breathing and frailty.  Start on empiric Levaquin to treat community-acquired pneumonia and empiric Flagyl to treat possible  Clostridium difficile.  Check blood cultures, urine culture, stool for C. difficile, strep pneumonia and legionella antigens.  Lactic acid WNL at 1.53.  Active Problems:   Hypothyroidism  Continue Synthroid.  Check TSH.    Dementia  Continue Aricept and Namenda.    Hyperlipidemia  Continue Lipitor.    Essential hypertension  Continue metoprolol and Norvasc but hold Cozaar for now.    GERD  Continue PPI therapy, but will need to discontinue this if C. difficile positive.     Acute renal failure in the setting of Stage III CKD (chronic kidney disease)  The patient's baseline creatinine is 0.89. Her current creatinine is 1.89.  Hold Cozaar and gently hydrate.    Diarrhea  Hold Lomotil and rule out C. difficile infection.  Continue empiric Flagyl while awaiting C. difficile studies.   COPD exacerbation   Start Solu-Medrol and nebulized bronchodilator therapy. Continue Dulera.  Provide supplemental oxygen.    DVT prophylaxis  Lovenox ordered.  Code Status: Full. Family Communication: 2 Daughters at the bedside. Disposition Plan: Home when stable.  Time spent: 70 minutes.  RAMA,CHRISTINA [facility] Pager [phone] Cell: [phone]   If 7PM-7AM, please contact night-coverage [email or web address] Password [email or web address] 03/26/2014, 12:11 AM

## 2014-03-25 NOTE — ED Notes (Addendum)
Pt started feeling 'listless' yesterday and today she was in bed all day. States that she had a fever with no symptoms before and had diverticulitis that went septic. Was told by doctor to come in if fever is over 100.4. Fever today was 101.8. Pt is alert and oriented at present.

## 2014-03-25 NOTE — ED Notes (Signed)
Pt ambulated to the bathroom for a urine sample, pt was not able to urinate at this time, RN notified

## 2014-03-25 NOTE — ED Notes (Signed)
Patient being transported to CT

## 2014-03-25 NOTE — ED Notes (Signed)
Placed patient on cardiac monitor, pulse ox, and BP cuff.

## 2014-03-26 ENCOUNTER — Encounter (HOSPITAL_COMMUNITY): Payer: Self-pay | Admitting: *Deleted

## 2014-03-26 DIAGNOSIS — N179 Acute kidney failure, unspecified: Secondary | ICD-10-CM | POA: Diagnosis present

## 2014-03-26 DIAGNOSIS — J441 Chronic obstructive pulmonary disease with (acute) exacerbation: Secondary | ICD-10-CM

## 2014-03-26 DIAGNOSIS — A419 Sepsis, unspecified organism: Secondary | ICD-10-CM | POA: Diagnosis present

## 2014-03-26 LAB — URINALYSIS, ROUTINE W REFLEX MICROSCOPIC
BILIRUBIN URINE: NEGATIVE
GLUCOSE, UA: NEGATIVE mg/dL
Ketones, ur: NEGATIVE mg/dL
NITRITE: NEGATIVE
PH: 5.5 (ref 5.0–8.0)
Protein, ur: NEGATIVE mg/dL
SPECIFIC GRAVITY, URINE: 1.008 (ref 1.005–1.030)
Urobilinogen, UA: 0.2 mg/dL (ref 0.0–1.0)

## 2014-03-26 LAB — CBC
HEMATOCRIT: 33.8 % — AB (ref 36.0–46.0)
HEMOGLOBIN: 10.6 g/dL — AB (ref 12.0–15.0)
MCH: 28.8 pg (ref 26.0–34.0)
MCHC: 31.4 g/dL (ref 30.0–36.0)
MCV: 91.8 fL (ref 78.0–100.0)
Platelets: UNDETERMINED 10*3/uL (ref 150–400)
RBC: 3.68 MIL/uL — ABNORMAL LOW (ref 3.87–5.11)
RDW: 15.7 % — ABNORMAL HIGH (ref 11.5–15.5)
WBC: 26.5 10*3/uL — ABNORMAL HIGH (ref 4.0–10.5)

## 2014-03-26 LAB — BASIC METABOLIC PANEL
Anion gap: 14 (ref 5–15)
BUN: 18 mg/dL (ref 6–23)
CALCIUM: 8.2 mg/dL — AB (ref 8.4–10.5)
CO2: 20 mEq/L (ref 19–32)
CREATININE: 1.58 mg/dL — AB (ref 0.50–1.10)
Chloride: 103 mEq/L (ref 96–112)
GFR calc Af Amer: 35 mL/min — ABNORMAL LOW (ref >90)
GFR, EST NON AFRICAN AMERICAN: 30 mL/min — AB (ref >90)
GLUCOSE: 119 mg/dL — AB (ref 70–99)
Potassium: 4.2 mEq/L (ref 3.7–5.3)
SODIUM: 137 meq/L (ref 137–147)

## 2014-03-26 LAB — MRSA PCR SCREENING: MRSA BY PCR: NEGATIVE

## 2014-03-26 LAB — URINE MICROSCOPIC-ADD ON

## 2014-03-26 LAB — TSH: TSH: 8.55 u[IU]/mL — AB (ref 0.350–4.500)

## 2014-03-26 LAB — LEGIONELLA ANTIGEN, URINE

## 2014-03-26 LAB — CLOSTRIDIUM DIFFICILE BY PCR: CDIFFPCR: POSITIVE — AB

## 2014-03-26 LAB — STREP PNEUMONIAE URINARY ANTIGEN: Strep Pneumo Urinary Antigen: NEGATIVE

## 2014-03-26 MED ORDER — ENOXAPARIN SODIUM 30 MG/0.3ML ~~LOC~~ SOLN
30.0000 mg | SUBCUTANEOUS | Status: DC
  Administered 2014-03-26 – 2014-03-27 (×2): 30 mg via SUBCUTANEOUS
  Filled 2014-03-26 (×2): qty 0.3

## 2014-03-26 MED ORDER — LEVOTHYROXINE SODIUM 125 MCG PO TABS
125.0000 ug | ORAL_TABLET | Freq: Every day | ORAL | Status: DC
  Administered 2014-03-26 – 2014-03-31 (×6): 125 ug via ORAL
  Filled 2014-03-26 (×8): qty 1

## 2014-03-26 MED ORDER — MEMANTINE HCL ER 28 MG PO CP24
28.0000 mg | ORAL_CAPSULE | Freq: Every day | ORAL | Status: DC
  Administered 2014-03-26 – 2014-03-31 (×6): 28 mg via ORAL
  Filled 2014-03-26 (×8): qty 28

## 2014-03-26 MED ORDER — ENSURE COMPLETE PO LIQD: 237.0000 mL | Freq: Two times a day (BID) | ORAL | Status: DC | PRN

## 2014-03-26 MED ORDER — ACETAMINOPHEN 325 MG PO TABS: 650.0000 mg | ORAL_TABLET | ORAL | Status: DC | PRN

## 2014-03-26 MED ORDER — DONEPEZIL HCL 10 MG PO TABS
10.0000 mg | ORAL_TABLET | Freq: Every day | ORAL | Status: DC
  Administered 2014-03-26 – 2014-03-30 (×5): 10 mg via ORAL
  Filled 2014-03-26 (×6): qty 1

## 2014-03-26 MED ORDER — ASPIRIN EC 81 MG PO TBEC
81.0000 mg | DELAYED_RELEASE_TABLET | Freq: Every day | ORAL | Status: DC
  Administered 2014-03-26 – 2014-03-31 (×6): 81 mg via ORAL
  Filled 2014-03-26 (×6): qty 1

## 2014-03-26 MED ORDER — METOPROLOL TARTRATE 25 MG PO TABS
50.0000 mg | ORAL_TABLET | Freq: Two times a day (BID) | ORAL | Status: DC
  Administered 2014-03-26 (×2): 50 mg via ORAL
  Filled 2014-03-26 (×3): qty 2

## 2014-03-26 MED ORDER — SERTRALINE HCL 25 MG PO TABS
25.0000 mg | ORAL_TABLET | Freq: Every day | ORAL | Status: DC
  Administered 2014-03-26 – 2014-03-31 (×6): 25 mg via ORAL
  Filled 2014-03-26 (×6): qty 1

## 2014-03-26 MED ORDER — SACCHAROMYCES BOULARDII 250 MG PO CAPS
250.0000 mg | ORAL_CAPSULE | Freq: Two times a day (BID) | ORAL | Status: DC
  Administered 2014-03-26 – 2014-03-31 (×10): 250 mg via ORAL
  Filled 2014-03-26 (×12): qty 1

## 2014-03-26 MED ORDER — VANCOMYCIN 50 MG/ML ORAL SOLUTION
125.0000 mg | Freq: Four times a day (QID) | ORAL | Status: DC
  Administered 2014-03-26 (×2): 125 mg via ORAL
  Filled 2014-03-26 (×6): qty 2.5

## 2014-03-26 MED ORDER — ONDANSETRON HCL 4 MG/2ML IJ SOLN: 4.0000 mg | Freq: Four times a day (QID) | INTRAMUSCULAR | Status: DC | PRN

## 2014-03-26 MED ORDER — ADULT MULTIVITAMIN W/MINERALS CH
1.0000 | ORAL_TABLET | Freq: Every day | ORAL | Status: DC
  Administered 2014-03-26 – 2014-03-31 (×6): 1 via ORAL
  Filled 2014-03-26 (×6): qty 1

## 2014-03-26 MED ORDER — ALBUTEROL SULFATE (2.5 MG/3ML) 0.083% IN NEBU: 2.5000 mg | INHALATION_SOLUTION | RESPIRATORY_TRACT | Status: DC | PRN

## 2014-03-26 MED ORDER — MOMETASONE FURO-FORMOTEROL FUM 100-5 MCG/ACT IN AERO
2.0000 | INHALATION_SPRAY | Freq: Two times a day (BID) | RESPIRATORY_TRACT | Status: DC
  Administered 2014-03-26 – 2014-03-31 (×11): 2 via RESPIRATORY_TRACT
  Filled 2014-03-26 (×2): qty 8.8

## 2014-03-26 MED ORDER — LEVOFLOXACIN IN D5W 750 MG/150ML IV SOLN: 750.0000 mg | INTRAVENOUS | Status: DC

## 2014-03-26 MED ORDER — METHYLPREDNISOLONE SODIUM SUCC 40 MG IJ SOLR
40.0000 mg | Freq: Two times a day (BID) | INTRAMUSCULAR | Status: DC
  Administered 2014-03-26 – 2014-03-27 (×3): 40 mg via INTRAVENOUS
  Filled 2014-03-26 (×3): qty 1

## 2014-03-26 MED ORDER — LEVOFLOXACIN IN D5W 500 MG/100ML IV SOLN: 500.0000 mg | INTRAVENOUS | Status: DC

## 2014-03-26 MED ORDER — IPRATROPIUM-ALBUTEROL 0.5-2.5 (3) MG/3ML IN SOLN
3.0000 mL | Freq: Four times a day (QID) | RESPIRATORY_TRACT | Status: DC
  Administered 2014-03-26 – 2014-03-29 (×14): 3 mL via RESPIRATORY_TRACT
  Filled 2014-03-26 (×17): qty 3

## 2014-03-26 MED ORDER — CETYLPYRIDINIUM CHLORIDE 0.05 % MT LIQD
7.0000 mL | Freq: Two times a day (BID) | OROMUCOSAL | Status: DC
  Administered 2014-03-26 – 2014-03-31 (×11): 7 mL via OROMUCOSAL

## 2014-03-26 MED ORDER — ATORVASTATIN CALCIUM 20 MG PO TABS
20.0000 mg | ORAL_TABLET | Freq: Every day | ORAL | Status: DC
Start: 2014-03-26 — End: 2014-03-31
  Administered 2014-03-26 – 2014-03-31 (×6): 20 mg via ORAL
  Filled 2014-03-26 (×3): qty 1
  Filled 2014-03-26: qty 2
  Filled 2014-03-26: qty 1
  Filled 2014-03-26: qty 2

## 2014-03-26 MED ORDER — AMLODIPINE BESYLATE 5 MG PO TABS
5.0000 mg | ORAL_TABLET | Freq: Every day | ORAL | Status: DC
  Administered 2014-03-26: 5 mg via ORAL
  Filled 2014-03-26 (×2): qty 1

## 2014-03-26 MED ORDER — GUAIFENESIN-DM 100-10 MG/5ML PO SYRP
5.0000 mL | ORAL_SOLUTION | ORAL | Status: DC | PRN
  Administered 2014-03-26: 5 mL via ORAL
  Filled 2014-03-26: qty 10

## 2014-03-26 MED ORDER — CALCIUM CARBONATE-VITAMIN D 500-200 MG-UNIT PO TABS
1.0000 | ORAL_TABLET | Freq: Every day | ORAL | Status: DC
  Administered 2014-03-26 – 2014-03-31 (×6): 1 via ORAL
  Filled 2014-03-26 (×6): qty 1

## 2014-03-26 MED ORDER — SODIUM CHLORIDE 0.9 % IV SOLN
INTRAVENOUS | Status: DC
  Administered 2014-03-26 – 2014-03-29 (×5): via INTRAVENOUS

## 2014-03-26 MED ORDER — PANTOPRAZOLE SODIUM 40 MG PO TBEC
40.0000 mg | DELAYED_RELEASE_TABLET | Freq: Every day | ORAL | Status: DC
  Administered 2014-03-26: 40 mg via ORAL
  Filled 2014-03-26: qty 1

## 2014-03-26 MED ORDER — CALCIUM-VITAMIN D 600-200 MG-UNIT PO CAPS: 1.0000 | ORAL_CAPSULE | Freq: Every day | ORAL | Status: DC

## 2014-03-26 NOTE — Progress Notes (Signed)
Patient ID: Sarah Johnson, female   DOB: 1932/09/23, 80 y.o.   MRN: [id number]  [facility] PROGRESS NOTE  Sarah Johnson KWI:[id number] DOB: 11-25-1932 DOA: 03/25/2014 PCP: Tommy Medal, MD  Brief narrative: 80 y.o. female with a PMH of emphysema, PE (no longer on Coumadin), COPD, status post porcine AVR, hypertension, hyperlipidemia, hypothyroidism, chronic kidney disease who was brought to the ER by her daughters for evaluation of fever and lethargy. The patient had an episode of sepsis caused by diverticulitis last year, and the patient's daughters were concerned that she was developing this again as the symptoms were similar. In the ER, she has been incontinent of loose stools. The patient's daughters report that she has had a problem with fecal incontinence since last year when she was diagnosed with diverticulitis. Over the past 24 hours, the patient has developed malaise, fever, and anorexia. She was treated for a UTI one month ago.   Assessment and Plan:   Principal Problem:  Sepsis from community-acquired pneumonia, rule out Clostridium difficile infection  Although the patient's principal complaint on admission was fever and diarrhea, she was found to have a bilateral pneumonia on radiography. She also has increased work of breathing and worsening dyspnea consistent with pneumonia, but given recent treatment with antibiotics for a urinary tract infection, Clostridium difficile colitis must be ruled out, especially given her significant leukocytosis.  Admitted to the stepdown unit given her increased work of breathing and frailty.  Started on empiric Levaquin to treat community-acquired pneumonia and empiric Flagyl to treat possible Clostridium difficile.  Blood cultures, urine culture, stool for C. difficile, strep pneumonia and legionella antigens still pending   Lactic acid WNL. Repeat CBC in AM.  Stable for transfer to telemetry   Active Problems:   Hypothyroidism  Continue Synthroid.  TSH 8.55. Once acute infection treated, will need repeat TSH in 4 weeks and if still elevated, may need increase in Synthroid  Dementia  Continue Aricept and Namenda.  Hyperlipidemia  Continue Lipitor.  Essential hypertension  Continue metoprolol and Norvasc   Continue to hold Cozaar for now.  GERD  Continue PPI therapy, but will need to discontinue this if C. difficile positive.  Acute renal failure in the setting of Stage III CKD (chronic kidney disease)  The patient's baseline creatinine is 0.89. Cr 1.89 on admission. Repeat BMP today  Hold Cozaar and continue to gently hydrate.  Diarrhea  Hold Lomotil and rule out C. difficile infection.  Continue empiric Flagyl while awaiting C. difficile studies. COPD exacerbation   Continue Solu-Medrol and nebulized bronchodilator therapy. Continue Dulera.  Provide supplemental oxygen.  DVT prophylaxis  Lovenox SQ    Moderate PCM  In the context of acute illness  Advance diet as pt able to tolerate   Code Status: Full Family Communication: Husband over the phone  Disposition Plan: Transfer to telemetry   IV Access:   Peripheral IV Procedures and diagnostic studies:    CT Abd Pelvis WO/C 03/25/2014  Patchy right middle lobe opacities, ? PNA, including atypical mycobacterium.  Colonic diverticulosis without diverticulitis.  Cholelithiasis, no acute cholecystitis.  Supraumbilical and right inguinal hernias containing fat.    CXR 03/25/2014    Concern for bilateral airspace disease, right side greater the left, ? PNA   Medical Consultants:   None  Other Consultants:   Physical therapy  Anti-Infectives:   Levaquin 11/30 -->  Flagyl 11/30 -->  Faye Ramsay, MD  [facility] Pager [phone]  If 7PM-7AM, please contact night-coverage [email or web address] Password [email or web address]  03/26/2014, 10:47 AM   LOS: 1 day   HPI/Subjective: No events overnight.   Objective: Filed Vitals:    03/26/14 0600 03/26/14 0800 03/26/14 0900 03/26/14 1000  BP: 128/61 123/52 112/70 123/35  Pulse: 78 1 79 72  Temp:  98.7 F (37.1 C)    TempSrc:  Oral    Resp: 25 24 23 28   Height:      Weight:      SpO2: 98% 96% 97% 97%    Intake/Output Summary (Last 24 hours) at 03/26/14 1047 Last data filed at 03/26/14 0845  Gross per 24 hour  Intake 291.67 ml  Output    600 ml  Net -308.33 ml    Exam:   General:  Pt is alert, follows commands appropriately, not in acute distress  Cardiovascular: Regular rate and rhythm, S1/S2, no murmurs, no rubs, no gallops  Respiratory: Clear to auscultation bilaterally, diminished air movement at bases with mild rhonchi, expiratory wheezing at bases   Abdomen: Soft, non tender, non distended, periumbilical hernia noted, bowel sounds present, no guarding  Extremities:  pulses DP and PT palpable bilaterally  Neuro: Grossly nonfocal  Data Reviewed: Basic Metabolic Panel:  Recent Labs Lab 03/25/14 2033  NA 134*  K 4.2  CL 98  CO2 20  GLUCOSE 135*  BUN 18  CREATININE 1.89*  CALCIUM 9.3   Liver Function Tests:  Recent Labs Lab 03/25/14 2033  AST 30  ALT 10  ALKPHOS 82  BILITOT 1.0  PROT 7.7  ALBUMIN 3.3*   CBC:  Recent Labs Lab 03/25/14 2033  WBC 28.5*  NEUTROABS 22.8*  HGB 12.2  HCT 37.0  MCV 90.2  PLT 174     Collection Time: 03/26/14  2:03 AM  Result Value Ref Range Status   MRSA by PCR NEGATIVE NEGATIVE Final     Scheduled Meds: . amLODipine  5 mg Oral Daily  . aspirin EC  81 mg Oral Daily  . atorvastatin  20 mg Oral Daily  . donepezil  10 mg Oral QHS  . enoxaparin  injection  30 mg Subcutaneous Q24H  . ipratropium-albuterol  3 mL Nebulization QID  . Levofloxacin  IV  500 mg Intravenous Q48H  . levothyroxine  125 mcg Oral QAC breakfast  . Memantine HCl ER  28 mg Oral Daily  . methylPREDNISolone  inj  40 mg Intravenous Q12H  . metoprolol  50 mg Oral BID  . metronidazole  500 mg Intravenous Q8H  .  mometasone-formoterol  2 puff Inhalation BID  . pantoprazole  40 mg Oral Daily  . sertraline  25 mg Oral Daily   Continuous Infusions: . sodium chloride 50 mL/hr at 03/26/14 0210

## 2014-03-26 NOTE — ED Notes (Signed)
Patient has a ready bed. Attempted to call report, patient is unable to go up to floor. Informed patient and family of the wait.

## 2014-03-26 NOTE — Plan of Care (Signed)
Problem: Consults Goal: General Medical Patient Education See Patient Education Module for specific education. Outcome: Completed/Met Date Met:  03/26/14 Goal: Skin Care Protocol Initiated - if Braden Score 18 or less If consults are not indicated, leave blank or document N/A Outcome: Completed/Met Date Met:  03/26/14 Goal: Nutrition Consult-if indicated Outcome: Completed/Met Date Met:  03/26/14 Goal: Diabetes Guidelines if Diabetic/Glucose > 140 If diabetic or lab glucose is > 140 mg/dl - Initiate Diabetes/Hyperglycemia Guidelines & Document Interventions  Outcome: Completed/Met Date Met:  03/26/14  Problem: Phase I Progression Outcomes Goal: Pain controlled with appropriate interventions Outcome: Completed/Met Date Met:  03/26/14 Goal: OOB as tolerated unless otherwise ordered Outcome: Progressing Goal: Hemodynamically stable Outcome: Progressing

## 2014-03-26 NOTE — Progress Notes (Signed)
INITIAL NUTRITION ASSESSMENT  DOCUMENTATION CODES Per approved criteria  -Not Applicable   INTERVENTION: -Encouraged PO intake and provided meal ordering assistance; recommend to continue with liberalized diet -Reviewed foods to assist with forming stool -Recommend Ensure Complete BID PRN, implement if pt continues to have <50% meal completion -RD to continue to monitor  NUTRITION DIAGNOSIS: Inadequate oral intake related to decreased appetite/loose stools as evidenced by decreased PO intake for past 3 days.   Goal: Pt to meet >/= 90% of their estimated nutrition needs    Monitor:  Total protein/energy intake, labs, weights, GI profile  Reason for Assessment: Consult to Assess  80 y.o. female  Admitting Dx: Sepsis  ASSESSMENT: Sarah Johnson is an 80 y.o. female with a PMH of emphysema, PE (no longer on Coumadin), COPD, status post porcine AVR, hypertension, hyperlipidemia, hypothyroidism, chronic kidney disease who was brought to the ER by her daughters for evaluation of fever and lethargy.  -Pt reported an acute decrease in appetite for past few days pta; however denied any unintentional wt loss -Diet recall indicated pt consuming small meals 2-3 times daily. Endorsed feelings of early satiety, anorexia, and loose stools that have contributed to decreased intake -Pt had not yet ordered meal, assisted in meal ordering. Encouraged pt to consume foods containing binding fiber such as bananas, rice, applesauce, potatoes, and toast, to help assist with forming stools. Pt verbalized understanding -C.diff results pending -Declined supplements at this time. Will order Ensure PRN if pt continues to have <50% meal completion  Height: Ht Readings from Last 1 Encounters:  03/26/14 5\' 2"  (1.575 m)    Weight: Wt Readings from Last 1 Encounters:  03/26/14 136 lb 11 oz (62 kg)    Ideal Body Weight: 110  % Ideal Body Weight: 124%  Wt Readings from Last 10 Encounters:  03/26/14  136 lb 11 oz (62 kg)  01/10/14 112 lb 3.2 oz (50.894 kg)  09/04/13 127 lb (57.607 kg)  08/20/13 125 lb (56.7 kg)  04/21/13 121 lb 7.6 oz (55.1 kg)  11/17/11 127 lb 6.4 oz (57.788 kg)  09/06/11 128 lb (58.06 kg)  06/23/11 131 lb 6.4 oz (59.603 kg)  06/04/11 128 lb (58.06 kg)  05/19/11 128 lb 8.5 oz (58.3 kg)    Usual Body Weight: pt unsure  % Usual Body Weight: unable to determine  BMI:  Body mass index is 24.99 kg/(m^2).  Estimated Nutritional Needs: Kcal: 1350-1550 Protein: 60-75 gram Fluid: >/= 1500 ml daily  Skin: WDL  Diet Order: Diet regular  EDUCATION NEEDS: -Education needs addressed   Intake/Output Summary (Last 24 hours) at 03/26/14 1107 Last data filed at 03/26/14 0845  Gross per 24 hour  Intake 291.67 ml  Output    600 ml  Net -308.33 ml    Last BM: 11/30   Labs:   Recent Labs Lab 03/25/14 2033  NA 134*  K 4.2  CL 98  CO2 20  BUN 18  CREATININE 1.89*  CALCIUM 9.3  GLUCOSE 135*    CBG (last 3)  No results for input(s): GLUCAP in the last 72 hours.  Scheduled Meds: . amLODipine  5 mg Oral Daily  . antiseptic oral rinse  7 mL Mouth Rinse BID  . aspirin EC  81 mg Oral Daily  . atorvastatin  20 mg Oral Daily  . calcium-vitamin D  1 tablet Oral Daily  . donepezil  10 mg Oral QHS  . enoxaparin (LOVENOX) injection  30 mg Subcutaneous Q24H  . ipratropium-albuterol  3 mL Nebulization QID  . [START ON 03/28/2014] levofloxacin (LEVAQUIN) IV  500 mg Intravenous Q48H  . levothyroxine  125 mcg Oral QAC breakfast  . Memantine HCl ER  28 mg Oral Daily  . methylPREDNISolone (SOLU-MEDROL) injection  40 mg Intravenous Q12H  . metoprolol  50 mg Oral BID  . metronidazole  500 mg Intravenous Q8H  . mometasone-formoterol  2 puff Inhalation BID  . multivitamin with minerals  1 tablet Oral Daily  . pantoprazole  40 mg Oral Daily  . sertraline  25 mg Oral Daily    Continuous Infusions: . sodium chloride 50 mL/hr at 03/26/14 0210    Past Medical  History  Diagnosis Date  . Emphysema     followed by Dr. Joya Gaskins  . S/P thoracic aortic aneurysm repair 1/10    also with bioprosthetic aortic valve prelacement [facility]). in 10/10 she has a descending thoraic aorta anuerysm and abdominal reapir [facility]). Vascular urgeon is Dr. Lunette Stands.   . Pulmonary embolism 2010    She is no onger taking coumadin  . Hypothyroidism   . Blind right eye   . Hyperlipidemia   . S/P aortic valve replacement with bioprosthetic valve     ehco (8/11) with EF 55-60%, mild-mod MR, mild-mod TR, poorly visualized bioprosthetic aortic valve but no significant regurgitation and gradient not significanly elevated.    . CKD (chronic kidney disease)   . HTN (hypertension)   . History of Doppler echocardiogram     carotid dopplers. (8/11) no significant stenosis  . COPD (chronic obstructive pulmonary disease)   . SVT (supraventricular tachycardia)     HOLTER MONITOR AFTER SURGERY   . GERD (gastroesophageal reflux disease)   . Arthritis   . Cataract     RT  . Hx of echocardiogram 2015    Echo (08/2013):  EF 60-65%, no RWMA, Gr 1 DD, AVR ok (mean 5 mmHg), mild MR, PASP 22 mmHg    Past Surgical History  Procedure Laterality Date  . Thoracoabdominal aortic aneurysm repair  01/28/09    repair  . Anomalous pulmonary venous return repair    . Thoracic aortic aneurysm repair  1/10    VALVE ALSO DONE  . Back surgery  1970   . Eye surgery      AS BABY- RT EYE  POOR SIGHT  . Tonsillectomy    . Lumbar laminectomy/decompression microdiscectomy  05/31/2011    Procedure: LUMBAR LAMINECTOMY/DECOMPRESSION MICRODISCECTOMY;  Surgeon: Elaina Hoops, MD;  Location: [place];  Service: Neurosurgery;  Laterality: Bilateral;  Bilateral Lumbar three-four decompressionj lumbar laminectomy     Atlee Abide [place] LDN Clinical Dietitian [phone]

## 2014-03-26 NOTE — Progress Notes (Signed)
Pharmacy - Levaquin  SCr reported as 1.89, CrCl ~33ml/min. Will order Levaquin 500mg  IV q48h to start 03/27/14. Pharmacy to f/u daily.  Romeo Rabon, PharmD, pager [phone]. 03/26/2014,12:15 AM.

## 2014-03-26 NOTE — Progress Notes (Signed)
CARE MANAGEMENT NOTE 03/26/2014  Patient:  Sarah Johnson, Sarah Johnson   Account Number:  [id number]  Date Initiated:  03/26/2014  Documentation initiated by:  Kahla Risdon  Subjective/Objective Assessment:   80 y.o. female with a PMH of emphysema, PE (no longer on Coumadin), COPD, status post porcine AVR, hypertension, hyperlipidemia, hypothyroidism, chronic kidney disease who was brought to the ER by her daughters for evaluation of fever and l     Action/Plan:   home when stable   Anticipated DC Date:  03/29/2014   Anticipated DC Plan:  HOME/SELF CARE  In-house referral  NA      DC Planning Services  NA      [facility] Choice  NA   Choice offered to / List presented to:  NA   DME arranged  NA      DME agency  NA     [place] arranged  NA      [place] agency  NA   Status of service:  In process, will continue to follow Medicare Important Message given?   (If response is "NO", the following Medicare IM given date fields will be blank) Date Medicare IM given:   Medicare IM given by:   Date Additional Medicare IM given:   Additional Medicare IM given by:    Discharge Disposition:    Per UR Regulation:  Reviewed for med. necessity/level of care/duration of stay  If discussed at [place] of Stay Meetings, dates discussed:    Comments:  03/26/2014/Kenzlei Runions L. Rosana Hoes, RN, BSN, CCM: Chart review for medical necessity and patient discharge needs. Case Manager will follow for patient condition changes. 03/08/2014/Anselm Aumiller L. Rosana Hoes, RN, BSN, CCM: CHART NOTE FOR PROGRESSION: Sepsis from community-acquired pneumonia, rule out Clostridium difficile infection

## 2014-03-26 NOTE — Progress Notes (Addendum)
Pt positive for Cdiff. Already on Flagyl. Add Vancomycin and isolation per order set. Added Florastor as well.  Clance Boll, NP [facility]

## 2014-03-27 ENCOUNTER — Inpatient Hospital Stay (HOSPITAL_COMMUNITY): Payer: Medicare Other

## 2014-03-27 LAB — CBC
HCT: 31.8 % — ABNORMAL LOW (ref 36.0–46.0)
Hemoglobin: 10.3 g/dL — ABNORMAL LOW (ref 12.0–15.0)
MCH: 28.9 pg (ref 26.0–34.0)
MCHC: 32.4 g/dL (ref 30.0–36.0)
MCV: 89.3 fL (ref 78.0–100.0)
PLATELETS: 128 10*3/uL — AB (ref 150–400)
RBC: 3.56 MIL/uL — ABNORMAL LOW (ref 3.87–5.11)
RDW: 15.3 % (ref 11.5–15.5)
WBC: 17.9 10*3/uL — ABNORMAL HIGH (ref 4.0–10.5)

## 2014-03-27 LAB — BASIC METABOLIC PANEL
ANION GAP: 14 (ref 5–15)
BUN: 20 mg/dL (ref 6–23)
CALCIUM: 8.5 mg/dL (ref 8.4–10.5)
CO2: 19 mEq/L (ref 19–32)
CREATININE: 1.39 mg/dL — AB (ref 0.50–1.10)
Chloride: 107 mEq/L (ref 96–112)
GFR calc non Af Amer: 35 mL/min — ABNORMAL LOW (ref >90)
GFR, EST AFRICAN AMERICAN: 41 mL/min — AB (ref >90)
Glucose, Bld: 140 mg/dL — ABNORMAL HIGH (ref 70–99)
Potassium: 4.2 mEq/L (ref 3.7–5.3)
Sodium: 140 mEq/L (ref 137–147)

## 2014-03-27 LAB — URINE CULTURE: Colony Count: 40000

## 2014-03-27 MED ORDER — LIP MEDEX EX OINT
TOPICAL_OINTMENT | CUTANEOUS | Status: DC | PRN
  Administered 2014-03-27: 1 via TOPICAL
  Filled 2014-03-27: qty 7

## 2014-03-27 MED ORDER — PREDNISONE 20 MG PO TABS
40.0000 mg | ORAL_TABLET | Freq: Every day | ORAL | Status: AC
  Administered 2014-03-28: 40 mg via ORAL
  Filled 2014-03-27: qty 2

## 2014-03-27 MED ORDER — SODIUM CHLORIDE 0.9 % IV BOLUS (SEPSIS): 1000.0000 mL | Freq: Once | INTRAVENOUS | Status: DC

## 2014-03-27 MED ORDER — PREDNISONE 10 MG PO TABS
10.0000 mg | ORAL_TABLET | Freq: Every day | ORAL | Status: AC
  Administered 2014-03-31: 10 mg via ORAL
  Filled 2014-03-27: qty 1

## 2014-03-27 MED ORDER — PREDNISONE 20 MG PO TABS: 50.0000 mg | ORAL_TABLET | Freq: Every day | ORAL | Status: DC

## 2014-03-27 MED ORDER — SODIUM CHLORIDE 0.9 % IV BOLUS (SEPSIS)
250.0000 mL | Freq: Once | INTRAVENOUS | Status: AC
  Administered 2014-03-27: 250 mL via INTRAVENOUS

## 2014-03-27 MED ORDER — LEVOFLOXACIN IN D5W 750 MG/150ML IV SOLN
750.0000 mg | INTRAVENOUS | Status: DC
  Administered 2014-03-27 – 2014-03-29 (×2): 750 mg via INTRAVENOUS
  Filled 2014-03-27 (×3): qty 150

## 2014-03-27 MED ORDER — PREDNISONE 20 MG PO TABS
20.0000 mg | ORAL_TABLET | Freq: Every day | ORAL | Status: AC
  Administered 2014-03-30: 20 mg via ORAL
  Filled 2014-03-27: qty 1

## 2014-03-27 MED ORDER — PREDNISONE 20 MG PO TABS
50.0000 mg | ORAL_TABLET | Freq: Every day | ORAL | Status: AC
  Administered 2014-03-27: 50 mg via ORAL
  Filled 2014-03-27: qty 3

## 2014-03-27 MED ORDER — METOPROLOL TARTRATE 25 MG PO TABS
25.0000 mg | ORAL_TABLET | Freq: Two times a day (BID) | ORAL | Status: DC
  Administered 2014-03-27 – 2014-03-31 (×7): 25 mg via ORAL
  Filled 2014-03-27 (×7): qty 1

## 2014-03-27 MED ORDER — PREDNISONE 20 MG PO TABS
30.0000 mg | ORAL_TABLET | Freq: Every day | ORAL | Status: AC
  Administered 2014-03-29: 30 mg via ORAL
  Filled 2014-03-27: qty 1

## 2014-03-27 NOTE — Evaluation (Signed)
Physical Therapy Evaluation Patient Details Name: Sarah Johnson MRN: [id number] DOB: 10-Apr-1933 Today's Date: 03/27/2014   History of Present Illness    Sarah Johnson is an 80 y.o. female with a PMH of emphysema, PE (no longer on Coumadin), COPD, status post porcine AVR, hypertension, hyperlipidemia, hypothyroidism, chronic kidney disease who was brought to the ER by her daughters for evaluation of fever and lethargy  Clinical Impression  Pt very pleasant, motivated and performing mobility tasks at min assist level.  Pt plans d/c to home with support of spouse.    Follow Up Recommendations No PT follow up    Equipment Recommendations  None recommended by PT    Recommendations for Other Services OT consult     Precautions / Restrictions Precautions Precautions: Fall Restrictions Weight Bearing Restrictions: No      Mobility  Bed Mobility               General bed mobility comments: Pt in chair with assist of nursing  Transfers Overall transfer level: Needs assistance Equipment used: None Transfers: Sit to/from Stand Sit to Stand: Min assist         General transfer comment: cues for use of UEs to self assist  Ambulation/Gait Ambulation/Gait assistance: Min assist Ambulation Distance (Feet): 180 Feet Assistive device: Rolling walker (2 wheeled) Gait Pattern/deviations: Step-through pattern;Decreased step length - right;Decreased step length - left;Shuffle;Trunk flexed     General Gait Details: cues for pacing and rest stops; multiple standing rests for task completion  Stairs            Wheelchair Mobility    Modified Rankin (Stroke Patients Only)       Balance                                             Pertinent Vitals/Pain Pain Assessment: No/denies pain    Home Living Family/patient expects to be discharged to:: Private residence Living Arrangements: Spouse/significant other Available Help at Discharge:  Family Type of Home: House Home Access: Level entry     Home Layout: One level Home Equipment: None      Prior Function Level of Independence: Independent         Comments: I was limited how far I could walk by my COPD     Hand Dominance   Dominant Hand: Right    Extremity/Trunk Assessment   Upper Extremity Assessment: Overall WFL for tasks assessed           Lower Extremity Assessment: Overall WFL for tasks assessed      Cervical / Trunk Assessment: Normal  Communication   Communication: No difficulties  Cognition Arousal/Alertness: Awake/alert Behavior During Therapy: WFL for tasks assessed/performed Overall Cognitive Status: Within Functional Limits for tasks assessed                      General Comments      Exercises        Assessment/Plan    PT Assessment Patient needs continued PT services  PT Diagnosis Difficulty walking   PT Problem List Decreased strength;Decreased range of motion;Decreased activity tolerance;Decreased mobility;Decreased knowledge of use of DME  PT Treatment Interventions DME instruction;Gait training;Functional mobility training;Therapeutic activities;Therapeutic exercise;Patient/family education   PT Goals (Current goals can be found in the Care Plan section) Acute Rehab PT Goals Patient Stated Goal: Resume previous lifestyle  PT Goal Formulation: With patient Time For Goal Achievement: 04/10/14 Potential to Achieve Goals: Good    Frequency Min 3X/week   Barriers to discharge        Co-evaluation               End of Session Equipment Utilized During Treatment: Gait belt;Oxygen Activity Tolerance: Patient tolerated treatment well Patient left: in chair;with call bell/phone within reach Nurse Communication: Mobility status         Time: 5400-8676 PT Time Calculation (min) (ACUTE ONLY): 20 min   Charges:   PT Evaluation $Initial PT Evaluation Tier I: 1 Procedure PT Treatments $Gait  Training: 8-22 mins   PT G Codes:          Elianie Hubers 03/27/2014, 12:57 PM

## 2014-03-27 NOTE — Progress Notes (Signed)
Patient ID: Sarah Johnson, female   DOB: 1932/04/27, 79 y.o.   MRN: [id number]  [facility] PROGRESS NOTE  Sarah Johnson [id number] DOB: July 14, 1932 DOA: 03/25/2014 PCP: Tommy Medal, MD   Brief narrative: 80 y.o. female with a PMH of emphysema, PE (no longer on Coumadin), COPD, status post porcine AVR, hypertension, hyperlipidemia, hypothyroidism, chronic kidney disease who was brought to the ER by her daughters for evaluation of fever and lethargy. The patient had an episode of sepsis caused by diverticulitis last year, and the patient's daughters were concerned that she was developing this again as the symptoms were similar. In the ER, she has been incontinent of loose stools. The patient's daughters report that she has had a problem with fecal incontinence since last year when she was diagnosed with diverticulitis. Over the past 24 hours, the patient has developed malaise, fever, and anorexia. She was treated for a UTI one month ago.   Assessment and Plan:   Principal Problem:  Sepsis from community-acquired pneumonia, Clostridium difficile infection  Although the patient's principal complaint on admission was fever and diarrhea, she was found to have a bilateral pneumonia on radiography. She also has increased work of breathing and worsening dyspnea consistent with pneumonia, but given recent treatment with antibiotics for a urinary tract infection, Clostridium difficile colitis was considered and now positive as expected  Started on empiric Levaquin to treat CAP and will continue day #3, also started empiric Flagyl on admission which will also continue as C. Diff is +  Blood cultures, urine culture, strep pneumonia and legionella antigens still pending   Lactic acid WNL. WBC is trending down: 28.5 --> 26.5 --> 17.9  Stable for transfer to telemetry  Active Problems:   Anemia of chronic disease, CKD stage III  Drop in Hg since admission likely dilutional    No signs of active bleeding   CBC in AM  Thrombocytopenia  Drop in Plt since admission  Close monitoring  May need to hold Lovenox if further drop noted   Hypothyroidism  Continue Synthroid.  TSH 8.55. Once acute infection treated, will need repeat TSH in 4 weeks and if still elevated, may need increase in Synthroid  Dementia  Continue Aricept and Namenda.  Hyperlipidemia  Continue Lipitor.  Essential hypertension  Continued metoprolol and Norvasc since admission but will hold Norvasc this AM as BP is soft 90's/60's  Continue to hold Cozaar for now.  GERD  Stop PPI as C. Diff positive  Acute renal failure in the setting of Stage III CKD (chronic kidney disease)  The patient's baseline creatinine is 0.89. Cr 1.89 on admission.   Cr trending down since admission: 1.89 --> 1.58 --> 1.39  Hold Cozaar and continue to gently hydrate.  Diarrhea  Secondary to C. Diff  Continue Flagyl as was started on admission  COPD exacerbation   Continue nebulized bronchodilator therapy. Continue Dulera.  D/C solumedrol and transition to Prednisone taper   Provide supplemental oxygen.  DVT prophylaxis  Lovenox SQ  Moderate PCM  In the context of acute illness  Advance diet as pt able to tolerate  Code Status: Full Family Communication: Husband over the phone  Disposition Plan: Transfer to telemetry   IV Access:    Peripheral IV Procedures and diagnostic studies:    CT Abd Pelvis WO/C 03/25/2014 Patchy right middle lobe opacities, ? PNA, including atypical mycobacterium. Colonic diverticulosis without diverticulitis. Cholelithiasis, no acute cholecystitis. Supraumbilical and right inguinal hernias containing fat.   CXR 03/25/2014 Concern for  bilateral airspace disease, right side greater the left, ? PNA  Medical Consultants:    None  Other Consultants:    Physical therapy  Anti-Infectives:    Levaquin 11/30 -->   Flagyl  11/30 -->  [name], Dedra Skeens, MD  [facility] Pager [phone]  If 7PM-7AM, please contact night-coverage [email or web address] Password [email or web address] 03/27/2014, 8:22 AM   LOS: 2 days    HPI/Subjective: No acute overnight events.  Objective: Filed Vitals:   03/27/14 0200 03/27/14 0300 03/27/14 0400 03/27/14 0600  BP: 108/51 119/50 99/45 97/44   Pulse: 70 71 70 66  Temp:      TempSrc:      Resp: 17 16 16 14   Height:      Weight:      SpO2: 96% 96% 93% 96%    Intake/Output Summary (Last 24 hours) at 03/27/14 1950 Last data filed at 03/27/14 0600  Gross per 24 hour  Intake   1425 ml  Output    350 ml  Net   1075 ml    Exam:   General:  Pt is alert, follows commands appropriately, not in acute distress  Cardiovascular: Regular rate and rhythm, S1/S2  Respiratory: Clear to auscultation bilaterally, no wheezing, mild rhonchi at bases   Abdomen: Soft, non tender, non distended, bowel sounds present  Extremities: No edema, pulses DP and PT palpable bilaterally  Data Reviewed: Basic Metabolic Panel:  Recent Labs Lab 03/25/14 2033 03/26/14 0345 03/27/14 0340  NA 134* 137 140  K 4.2 4.2 4.2  CL 98 103 107  CO2 20 20 19   GLUCOSE 135* 119* 140*  BUN 18 18 20   CREATININE 1.89* 1.58* 1.39*  CALCIUM 9.3 8.2* 8.5   Liver Function Tests:  Recent Labs Lab 03/25/14 2033  AST 30  ALT 10  ALKPHOS 82  BILITOT 1.0  PROT 7.7  ALBUMIN 3.3*   CBC:  Recent Labs Lab 03/25/14 2033 03/26/14 0345 03/27/14 0340  WBC 28.5* 26.5* 17.9*  NEUTROABS 22.8*  --   --   HGB 12.2 10.6* 10.3*  HCT 37.0 33.8* 31.8*  MCV 90.2 91.8 89.3  PLT 174 PLATELET CLUMPS NOTED ON SMEAR, UNABLE TO ESTIMATE 128*    Recent Results (from the past 240 hour(s))  MRSA PCR Screening     Status: None   Collection Time: 03/26/14  2:03 AM  Result Value Ref Range Status   MRSA by PCR NEGATIVE NEGATIVE Final    Comment:        The GeneXpert MRSA Assay (FDA approved for NASAL specimens only), is one  component of a comprehensive MRSA colonization surveillance program. It is not intended to diagnose MRSA infection nor to guide or monitor treatment for MRSA infections.   Clostridium Difficile by PCR     Status: Abnormal   Collection Time: 03/26/14  8:56 AM  Result Value Ref Range Status   C difficile by pcr POSITIVE (A) NEGATIVE Final    Comment: CRITICAL RESULT CALLED TO, READ BACK BY AND VERIFIED WITH: Lady Deutscher RN 11:55 03/26/14 ([email or web address]) Performed at [facility]      Scheduled Meds: . amLODipine  5 mg Oral Daily  . antiseptic oral rinse  7 mL Mouth Rinse BID  . aspirin EC  81 mg Oral Daily  . atorvastatin  20 mg Oral Daily  . calcium-vitamin D  1 tablet Oral Daily  . donepezil  10 mg Oral QHS  . enoxaparin (LOVENOX) injection  30 mg Subcutaneous Q24H  . ipratropium-albuterol  3 mL  Nebulization QID  . [START ON 03/28/2014] levofloxacin (LEVAQUIN) IV  500 mg Intravenous Q48H  . levothyroxine  125 mcg Oral QAC breakfast  . Memantine HCl ER  28 mg Oral Daily  . methylPREDNISolone (SOLU-MEDROL) injection  40 mg Intravenous Q12H  . metoprolol  50 mg Oral BID  . metronidazole  500 mg Intravenous Q8H  . mometasone-formoterol  2 puff Inhalation BID  . multivitamin with minerals  1 tablet Oral Daily  . pantoprazole  40 mg Oral Daily  . saccharomyces boulardii  250 mg Oral BID  . sertraline  25 mg Oral Daily  . vancomycin  125 mg Oral QID   Continuous Infusions: . sodium chloride 50 mL/hr at 03/27/14 0234

## 2014-03-27 NOTE — Progress Notes (Signed)
ANTIBIOTIC CONSULT NOTE - FOLLOW UP  Pharmacy Consult for Levaquin Indication: pneumonia  Allergies  Allergen Reactions  . Codeine   . Penicillins Other (See Comments)    Patient and family don't recall specific rxn to PCN    Patient Measurements: Height: 5\' 2"  (157.5 cm) Weight: 136 lb 11 oz (62 kg) IBW/kg (Calculated) : 50.1  Vital Signs: Temp: 99.1 F (37.3 C) (12/02 0000) Temp Source: Rectal (12/02 0000) BP: 102/44 mmHg (12/02 1115) Pulse Rate: 82 (12/02 1115) Intake/Output from previous day: 12/01 0701 - 12/02 0700 In: 1425 [I.V.:1125; IV Piggyback:300] Out: 600 [Urine:600] Intake/Output from this shift:    Labs:  Recent Labs  03/25/14 2033 03/26/14 0345 03/27/14 0340  WBC 28.5* 26.5* 17.9*  HGB 12.2 10.6* 10.3*  PLT 174 PLATELET CLUMPS NOTED ON SMEAR, UNABLE TO ESTIMATE 128*  CREATININE 1.89* 1.58* 1.39*   Estimated Creatinine Clearance: 28.4 mL/min (by C-G formula based on Cr of 1.39). No results for input(s): VANCOTROUGH, VANCOPEAK, VANCORANDOM, GENTTROUGH, GENTPEAK, GENTRANDOM, TOBRATROUGH, TOBRAPEAK, TOBRARND, AMIKACINPEAK, AMIKACINTROU, AMIKACIN in the last 72 hours.     Assessment: 30 yoF admitted 11/30 with fever and diarrhea. Pt states she has Hx of diverticulitis and has been septic about 1 year prior. Pharmacy is consulted to dose levofloxacin for sepsis 2/2 CAP and Flagyl started for r/o C.diff infection.  11/30 >> metronidazole >>  11/30 >> levofloxacin >>   Tmax: 99.3 WBCs: elevated but trending down Renal: SCr 1.39 (improving), CrCl 31 ml/min (CG)  11/30 blood x2: ngtd 12/1 urine: ordered  12/1 MRSA screen neg 12/1 strep pneumo ur ag: neg 12/1 legionella ur ag: neg 12/1 C.diff PCR (+)  Today is day #3 levofloxacin for sepsis 2/2 CAP and metronidazole 500mg  q8h for C.diff. CXR shows bilateral PNA. C.diff positive, severe episode d/t WBC >15K so would recommend switching C.diff treatment to PO vanc per C.diff treatment  protocol.  Goal of Therapy:  Doses adjusted per renal function Eradication of infection  Plan:  Adjust Levaquin to 750 mg IV q48h for improved SCr. F/u SCr.  Hershal Coria 03/27/2014,11:17 AM

## 2014-03-27 NOTE — Progress Notes (Signed)
O2 sats were in the low 90's on room air.  When patient went to sleep O2 sats dropped to 89% and remained. Replaced O2 Nasal cannula at 2 liters.

## 2014-03-27 NOTE — Progress Notes (Signed)
According to report patient has not urinated all day. Bladder scan showed 191 cc's. No complaints of abdominal discomfort or pressure. Abdomen is distended, but soft. NP on call made aware. New order given. Encouraged patient to drink fluids throughout the night. Will continue to monitor closely.

## 2014-03-27 NOTE — Progress Notes (Signed)
No urine output.  Patient has audible expiratory wheezes that have increased since the am.  Notified Dr. Charlies Silvers , and orders received.

## 2014-03-28 DIAGNOSIS — A047 Enterocolitis due to Clostridium difficile: Secondary | ICD-10-CM

## 2014-03-28 LAB — BASIC METABOLIC PANEL
Anion gap: 15 (ref 5–15)
BUN: 22 mg/dL (ref 6–23)
CALCIUM: 8.1 mg/dL — AB (ref 8.4–10.5)
CO2: 19 mEq/L (ref 19–32)
Chloride: 110 mEq/L (ref 96–112)
Creatinine, Ser: 1.34 mg/dL — ABNORMAL HIGH (ref 0.50–1.10)
GFR calc Af Amer: 42 mL/min — ABNORMAL LOW (ref >90)
GFR calc non Af Amer: 37 mL/min — ABNORMAL LOW (ref >90)
GLUCOSE: 135 mg/dL — AB (ref 70–99)
Potassium: 3.7 mEq/L (ref 3.7–5.3)
Sodium: 144 mEq/L (ref 137–147)

## 2014-03-28 LAB — CBC
HCT: 29.6 % — ABNORMAL LOW (ref 36.0–46.0)
HEMOGLOBIN: 9.7 g/dL — AB (ref 12.0–15.0)
MCH: 28.7 pg (ref 26.0–34.0)
MCHC: 32.8 g/dL (ref 30.0–36.0)
MCV: 87.6 fL (ref 78.0–100.0)
Platelets: 235 10*3/uL (ref 150–400)
RBC: 3.38 MIL/uL — ABNORMAL LOW (ref 3.87–5.11)
RDW: 15.5 % (ref 11.5–15.5)
WBC: 17.4 10*3/uL — ABNORMAL HIGH (ref 4.0–10.5)

## 2014-03-28 MED ORDER — VANCOMYCIN 50 MG/ML ORAL SOLUTION
125.0000 mg | Freq: Four times a day (QID) | ORAL | Status: DC
  Administered 2014-03-28 – 2014-03-31 (×13): 125 mg via ORAL
  Filled 2014-03-28 (×15): qty 2.5

## 2014-03-28 NOTE — Plan of Care (Signed)
Problem: Phase I Progression Outcomes Goal: Initial discharge plan identified Outcome: Completed/Met Date Met:  03/28/14 Goal: Voiding-avoid urinary catheter unless indicated Outcome: Completed/Met Date Met:  03/28/14  Problem: Phase II Progression Outcomes Goal: Progress activity as tolerated unless otherwise ordered Outcome: Completed/Met Date Met:  03/28/14

## 2014-03-28 NOTE — Progress Notes (Addendum)
ANTIBIOTIC CONSULT NOTE - INITIAL  Pharmacy Consult for Vancomycin (PO), Levofloxacin Indication: C.diff treatment, sepsis 2/2 CAP  Allergies  Allergen Reactions  . Codeine   . Penicillins Other (See Comments)    Patient and family don't recall specific rxn to PCN    Patient Measurements: Height: 5\' 2"  (157.5 cm) Weight: 139 lb 12.4 oz (63.4 kg) IBW/kg (Calculated) : 50.1  Vital Signs: Temp: 98.1 F (36.7 C) (12/03 0546) Temp Source: Oral (12/03 0546) BP: 122/60 mmHg (12/03 0546) Pulse Rate: 92 (12/03 0546) Intake/Output from previous day: 12/02 0701 - 12/03 0700 In: 2542.5 [P.O.:195; I.V.:1197.5; IV Piggyback:1150] Out: 350 [Urine:350] Intake/Output from this shift:    Labs:  Recent Labs  03/26/14 0345 03/27/14 0340 03/28/14 0453  WBC 26.5* 17.9* 17.4*  HGB 10.6* 10.3* 9.7*  PLT PLATELET CLUMPS NOTED ON SMEAR, UNABLE TO ESTIMATE 128* 235  CREATININE 1.58* 1.39* 1.34*   Estimated Creatinine Clearance: 29.8 mL/min (by C-G formula based on Cr of 1.34). No results for input(s): VANCOTROUGH, VANCOPEAK, VANCORANDOM, GENTTROUGH, GENTPEAK, GENTRANDOM, TOBRATROUGH, TOBRAPEAK, TOBRARND, AMIKACINPEAK, AMIKACINTROU, AMIKACIN in the last 72 hours.   Microbiology: Recent Results (from the past 720 hour(s))  Blood Culture (routine x 2)     Status: None (Preliminary result)   Collection Time: 03/25/14  9:07 PM  Result Value Ref Range Status   Specimen Description BLOOD RIGHT WRIST  Final   Special Requests BOTTLES DRAWN AEROBIC AND ANAEROBIC 3CC  Final   Culture  Setup Time   Final    03/26/2014 01:03 Performed at [facility]    Culture   Final           BLOOD CULTURE RECEIVED NO GROWTH TO DATE CULTURE WILL BE HELD FOR 5 DAYS BEFORE ISSUING A FINAL NEGATIVE REPORT Performed at [facility]    Report Status PENDING  Incomplete  Blood Culture (routine x 2)     Status: None (Preliminary result)   Collection Time: 03/25/14  9:58 PM  Result Value Ref Range  Status   Specimen Description BLOOD LEFT ANTECUBITAL  Final   Special Requests BOTTLES DRAWN AEROBIC AND ANAEROBIC 5ML  Final   Culture  Setup Time   Final    03/26/2014 01:03 Performed at [facility]    Culture   Final           BLOOD CULTURE RECEIVED NO GROWTH TO DATE CULTURE WILL BE HELD FOR 5 DAYS BEFORE ISSUING A FINAL NEGATIVE REPORT Performed at [facility]    Report Status PENDING  Incomplete  MRSA PCR Screening     Status: None   Collection Time: 03/26/14  2:03 AM  Result Value Ref Range Status   MRSA by PCR NEGATIVE NEGATIVE Final    Comment:        The GeneXpert MRSA Assay (FDA approved for NASAL specimens only), is one component of a comprehensive MRSA colonization surveillance program. It is not intended to diagnose MRSA infection nor to guide or monitor treatment for MRSA infections.   Urine culture     Status: None   Collection Time: 03/26/14  4:15 AM  Result Value Ref Range Status   Specimen Description URINE, CLEAN CATCH  Final   Special Requests NONE  Final   Culture  Setup Time   Final    03/26/2014 08:44 Performed at [place]   Final    40,000 COLONIES/ML Performed at [facility]  Final    Multiple bacterial morphotypes present, none predominant. Suggest appropriate recollection if clinically indicated. Performed at [facility]    Report Status 03/27/2014 FINAL  Final  Clostridium Difficile by PCR     Status: Abnormal   Collection Time: 03/26/14  8:56 AM  Result Value Ref Range Status   C difficile by pcr POSITIVE (A) NEGATIVE Final    Comment: CRITICAL RESULT CALLED TO, READ BACK BY AND VERIFIED WITH: Lady Deutscher RN 11:55 03/26/14 ([email or web address]) Performed at [place] History: Past Medical History  Diagnosis Date  . Emphysema     followed by Dr. Joya Gaskins  . S/P thoracic aortic aneurysm repair 1/10    also with bioprosthetic aortic valve prelacement  [facility]). in 10/10 she has a descending thoraic aorta anuerysm and abdominal reapir [facility]). Vascular urgeon is Dr. Lunette Stands.   . Pulmonary embolism 2010    She is no onger taking coumadin  . Hypothyroidism   . Blind right eye   . Hyperlipidemia   . S/P aortic valve replacement with bioprosthetic valve     ehco (8/11) with EF 55-60%, mild-mod MR, mild-mod TR, poorly visualized bioprosthetic aortic valve but no significant regurgitation and gradient not significanly elevated.    . CKD (chronic kidney disease)   . HTN (hypertension)   . History of Doppler echocardiogram     carotid dopplers. (8/11) no significant stenosis  . COPD (chronic obstructive pulmonary disease)   . SVT (supraventricular tachycardia)     HOLTER MONITOR AFTER SURGERY   . GERD (gastroesophageal reflux disease)   . Arthritis   . Cataract     RT  . Hx of echocardiogram 2015    Echo (08/2013):  EF 60-65%, no RWMA, Gr 1 DD, AVR ok (mean 5 mmHg), mild MR, PASP 22 mmHg    Assessment: 37 yoF admitted 11/30 with fever and diarrhea. Pt states she has hx of diverticulitis and had been septic about 1 year prior. Pharmacy is consulted to dose levofloxacin for sepsis 2/2 CAP.  Patient is currently on Flagyl for C.diff infection, and pharmacy now consulted to transition to PO Vancomycin dosing for C.diff.  11/30 >> Metronidazole >> 12/3 11/30 >> Levofloxacin >>  12/2 >> Vancomycin PO >>  Tmax: afebrile WBCs: elevated but trending down, 17.4 today Renal: SCr 1.34 (improving), CrCl 30 ml/min (CG)  11/30 blood x2: NGTD 12/1 urine: 40000 colonies/mL multiple bacterial morphotypes present, none predominant 12/1 MRSA screen: neg 12/1 strep pneumo ur ag: neg 12/1 legionella ur ag: neg 12/1 C.diff PCR (+)  Goal of Therapy:  Appropriate antibiotic dosing for renal function and indication Eradication of infection  Plan:   Continue Levofloxacin 750 mg q48h for CAP.  Vancomycin 125 mg PO QID x 14 days total for  C.diff treatment, as considered severe episode d/t WBC > 15K  D/C Metronidazole per guidelines per discussion with MD.  Follow up renal function, cultures, clinical course.   Lindell Spar, PharmD, BCPS Pager: [phone] 03/28/2014 8:23 AM

## 2014-03-28 NOTE — Care Management Note (Signed)
    Page 1 of 2   03/28/2014     4:12:07 PM CARE MANAGEMENT NOTE 03/28/2014  Patient:  Sarah Johnson, Sarah Johnson   Account Number:  [id number]  Date Initiated:  03/26/2014  Documentation initiated by:  DAVIS,RHONDA  Subjective/Objective Assessment:   80 y.o. female with a PMH of emphysema, PE (no longer on Coumadin), COPD, status post porcine AVR, hypertension, hyperlipidemia, hypothyroidism, chronic kidney disease who was brought to the ER by her daughters for evaluation of fever and l     Action/Plan:   home when stable   Anticipated DC Date:  03/29/2014   Anticipated DC Plan:  HOME/SELF CARE  In-house referral  NA      DC Planning Services  CM consult      PAC Choice  NA   Choice offered to / List presented to:  NA   DME arranged  NA      DME agency  NA     [place] arranged  NA      [place] agency  NA   Status of service:  In process, will continue to follow Medicare Important Message given?  YES (If response is "NO", the following Medicare IM given date fields will be blank) Date Medicare IM given:  03/28/2014 Medicare IM given by:  [facility] Date Additional Medicare IM given:   Additional Medicare IM given by:    Discharge Disposition:    Per UR Regulation:  Reviewed for med. necessity/level of care/duration of stay  If discussed at [place] of Stay Meetings, dates discussed:    Comments:  03/28/14 Marcel Gary RN,BSN NCM [place].MONITOR PROGRESS & D/C NEEDS.PT-NO F/U.  03/26/2014/Rhonda L. Rosana Hoes, RN, BSN, CCM: Chart review for medical necessity and patient discharge needs. Case Manager will follow for patient condition changes. 03/08/2014/Rhonda L. Rosana Hoes, RN, BSN, CCM: CHART NOTE FOR PROGRESSION: Sepsis from community-acquired pneumonia, rule out Clostridium difficile infection

## 2014-03-28 NOTE — Progress Notes (Addendum)
Patient ID: Sarah Johnson, female   DOB: 12-24-1932, 80 y.o.   MRN: [id number] [facility] PROGRESS NOTE  Sarah Johnson DDU:[id number] DOB: [birth year] DOA: 03/25/2014 PCP: Tommy Medal, MD  Brief narrative:    80 y.o. female with a PMH of emphysema, PE (no longer on Coumadin), COPD, status post porcine AVR, hypertension, hyperlipidemia, hypothyroidism, chronic kidney disease, recent trewatment with antibiotic for UTI who was brought to the ER by her daughters for evaluation of fever and lethargy. The patient had an episode of sepsis caused by diverticulitis last year, and the patient's daughters were concerned that she was developing this again as the symptoms were similar. In the ER, she has been incontinent of loose stools. The patient's daughters report that she has had a problem with fecal incontinence since last year when she was diagnosed with diverticulitis. Hospital course is complicated with persistent C.diff. Overnight, patient developed wheezing and was short of breath. CXR showed bilateral airspace disease stable on left and improving on the right. In addition, pt had minimal urine output. IV fluids given.  Assessment/Plan:    Principal Problem:  Sepsis from community-acquired pneumonia, Clostridium difficile infection - Although the patient's principal complaint on admission was fever and diarrhea, she was found to have a bilateral pneumonia on radiography. She also has increased work of breathing and worsening dyspnea consistent with pneumonia, but given recent treatment with antibiotics for a urinary tract infection, Clostridium difficile colitis was considered and now positive as expected - continue levaquin IV for pneumonia - stop flagyl and start vanco PO per pharmacy recommendations since patient is considered to have severe C.diff since pt has WBC count greater than 15K - blood cultures to date are negative, legionella and strep penumonia are both negative, urine  culture with multiple morphological species none predominant - lactic acid WNL. As mentioned above WBC count persistently high although id did trend down since admission from 28.5 to 17.4. - continue to monitor urine output. - pt was transferred out of SDU 03/27/2014  Active Problems:  Anemia of chronic disease, CKD stage III - Drop in Hg since admission likely dilutional  - No signs of active bleeding    Thrombocytopenia - Drop in Plt since admission - initially thought to be from Lovenox but platelet count now normalized.   Hypothyroidism - Continue Synthroid. - TSH 8.55. Once acute infection treated, will need repeat TSH in 4 weeks and if still elevated, may need increase in Synthroid   Dementia - Continue Aricept and Namenda.   Hyperlipidemia - Continue Lipitor.   Essential hypertension - cozaar and norvasc on hold since BP was on soft side. Continue metoprolol 25 mg PO BID.    GERD - Stop PPI as C. Diff positive. May use pepcid.   Acute renal failure in the setting of Stage III CKD (chronic kidney disease) - The patient's baseline creatinine is 0.89. Cr 1.89 on admission.  - Cr trending down since admission: 1.89 --> 1.58 --> 1.39 - we are holding cozaar which may have contributed to higher creatinine values.  COPD exacerbation  - Continue nebulized bronchodilator therapy. Continue Dulera. - on prednisone taper - continue supplemental oxygen     Moderate PCM - In the context of acute illness - Advance diet as pt able to tolerate  Code Status: Full Family Communication: Husband over the phone     IV access:   Peripheral IV  Procedures and diagnostic studies:    Ct Abdomen Pelvis Wo Contrast 03/25/2014   Patchy  right middle lobe opacities suspicious for infection/pneumonia, including atypical mycobacterium.  No evidence of acute abnormality within the abdomen or pelvis.  Colonic diverticulosis without diverticulitis.  Cholelithiasis without CT  evidence of acute cholecystitis.  Unchanged evidence of aortic repair.  Supraumbilical and right inguinal hernias containing fat.   Electronically Signed   By: Hassan Rowan M.D.   On: 03/25/2014 22:41   Dg Chest 1 View 03/27/2014  Bilateral airspace disease is stable on the left but improved on the right.   Electronically Signed   By: Maryclare Bean M.D.   On: 03/27/2014 20:13   Dg Chest Port 1 View 03/25/2014  Concern for bilateral airspace disease, right side greater the left. Findings could represent pneumonia. Recommend follow up to ensure resolution.   Electronically Signed   By: Markus Daft M.D.   On: 03/25/2014 21:51    Medical Consultants:   None   Other Consultants:   Physical therapy   IAnti-Infectives:    Levaquin 11/30 -->   Flagyl 11/30 --> 03/28/2014  Vanco 03/28/2014 -->    Gagan Dillion, Dedra Skeens, MD  [facility] Pager [phone]  If 7PM-7AM, please contact night-coverage [email or web address] Password [facility] 03/28/2014, 6:47 AM   LOS: 3 days    HPI/Subjective: No acute overnight events.  Objective: Filed Vitals:   03/27/14 1824 03/27/14 2000 03/27/14 2018 03/28/14 0546  BP:  103/39 105/45 122/60  Pulse: 88 97 97 92  Temp:  98.1 F (36.7 C) 98 F (36.7 C) 98.1 F (36.7 C)  TempSrc:  Oral Oral Oral  Resp: 30 23 23 22   Height:   5\' 2"  (1.575 m)   Weight:   63.4 kg (139 lb 12.4 oz)   SpO2: 93% 94% 97% 95%    Intake/Output Summary (Last 24 hours) at 03/28/14 0647 Last data filed at 03/28/14 0556  Gross per 24 hour  Intake   2220 ml  Output    350 ml  Net   1870 ml    Exam:   General:  Pt is alert, follows commands appropriately, not in acute distress  Cardiovascular: Regular rate and rhythm, S1/S2, no murmurs  Respiratory: Clear to auscultation bilaterally, no wheezing, no crackles, no rhonchi  Abdomen: Soft, non tender, non distended, bowel sounds present  Extremities: No edema, pulses DP and PT palpable bilaterally  Neuro: Grossly nonfocal  Data  Reviewed: Basic Metabolic Panel:  Recent Labs Lab 03/25/14 2033 03/26/14 0345 03/27/14 0340 03/28/14 0453  NA 134* 137 140 144  K 4.2 4.2 4.2 3.7  CL 98 103 107 110  CO2 20 20 19 19   GLUCOSE 135* 119* 140* 135*  BUN 18 18 20 22   CREATININE 1.89* 1.58* 1.39* 1.34*  CALCIUM 9.3 8.2* 8.5 8.1*   Liver Function Tests:  Recent Labs Lab 03/25/14 2033  AST 30  ALT 10  ALKPHOS 82  BILITOT 1.0  PROT 7.7  ALBUMIN 3.3*   No results for input(s): LIPASE, AMYLASE in the last 168 hours. No results for input(s): AMMONIA in the last 168 hours. CBC:  Recent Labs Lab 03/25/14 2033 03/26/14 0345 03/27/14 0340 03/28/14 0453  WBC 28.5* 26.5* 17.9* 17.4*  NEUTROABS 22.8*  --   --   --   HGB 12.2 10.6* 10.3* 9.7*  HCT 37.0 33.8* 31.8* 29.6*  MCV 90.2 91.8 89.3 87.6  PLT 174 PLATELET CLUMPS NOTED ON SMEAR, UNABLE TO ESTIMATE 128* 235   Cardiac Enzymes: No results for input(s): CKTOTAL, CKMB, CKMBINDEX, TROPONINI in the last 168 hours. BNP:  Invalid input(s): POCBNP CBG: No results for input(s): GLUCAP in the last 168 hours.  Recent Results (from the past 240 hour(s))  Blood Culture (routine x 2)     Status: None (Preliminary result)   Collection Time: 03/25/14  9:07 PM  Result Value Ref Range Status   Specimen Description BLOOD RIGHT WRIST  Final   Special Requests BOTTLES DRAWN AEROBIC AND ANAEROBIC 3CC  Final   Culture  Setup Time   Final    03/26/2014 01:03 Performed at [facility]    Culture   Final           BLOOD CULTURE RECEIVED NO GROWTH TO DATE CULTURE WILL BE HELD FOR 5 DAYS BEFORE ISSUING A FINAL NEGATIVE REPORT Performed at [facility]    Report Status PENDING  Incomplete  Blood Culture (routine x 2)     Status: None (Preliminary result)   Collection Time: 03/25/14  9:58 PM  Result Value Ref Range Status   Specimen Description BLOOD LEFT ANTECUBITAL  Final   Special Requests BOTTLES DRAWN AEROBIC AND ANAEROBIC 5ML  Final   Culture  Setup  Time   Final    03/26/2014 01:03 Performed at [facility]    Culture   Final           BLOOD CULTURE RECEIVED NO GROWTH TO DATE CULTURE WILL BE HELD FOR 5 DAYS BEFORE ISSUING A FINAL NEGATIVE REPORT Performed at [facility]    Report Status PENDING  Incomplete  MRSA PCR Screening     Status: None   Collection Time: 03/26/14  2:03 AM  Result Value Ref Range Status   MRSA by PCR NEGATIVE NEGATIVE Final  Urine culture     Status: None   Collection Time: 03/26/14  4:15 AM  Result Value Ref Range Status   Specimen Description URINE, CLEAN CATCH  Final   Special Requests NONE  Final   Culture  Setup Time   Final    03/26/2014 08:44 Performed at [place]   Final   Culture   Final    Multiple bacterial morphotypes present, none predominant. Suggest appropriate recollection if clinically indicated. Performed at [facility]    Report Status 03/27/2014 FINAL  Final  Clostridium Difficile by PCR     Status: Abnormal   Collection Time: 03/26/14  8:56 AM  Result Value Ref Range Status   C difficile by pcr POSITIVE (A) NEGATIVE Final    Comment: CRITICAL RESULT CALLED TO, READ BACK BY AND VERIFIED WITH: Lady Deutscher RN 11:55 03/26/14 ([email or web address]) Performed at [facility]      Scheduled Meds: . antiseptic oral rinse  7 mL Mouth Rinse BID  . aspirin EC  81 mg Oral Daily  . atorvastatin  20 mg Oral Daily  . calcium-vitamin D  1 tablet Oral Daily  . donepezil  10 mg Oral QHS  . ipratropium-albuterol  3 mL Nebulization QID  . levofloxacin (LEVAQUIN) IV  750 mg Intravenous Q48H  . levothyroxine  125 mcg Oral QAC breakfast  . Memantine HCl ER  28 mg Oral Daily  . metoprolol  25 mg Oral BID  . metronidazole  500 mg Intravenous Q8H  . mometasone-formoterol  2 puff Inhalation BID  . multivitamin with minerals  1 tablet Oral Daily  . predniSONE  40 mg Oral Q breakfast   Followed by  . [START ON 03/29/2014] predniSONE  30 mg Oral Q  breakfast  Followed by  . [START ON 03/30/2014] predniSONE  20 mg Oral Q breakfast   Followed by  . [START ON 03/31/2014] predniSONE  10 mg Oral Q breakfast  . saccharomyces boulardii  250 mg Oral BID  . sertraline  25 mg Oral Daily  . sodium chloride  1,000 mL Intravenous Once   Continuous Infusions: . sodium chloride 50 mL/hr at 03/27/14 2256

## 2014-03-28 NOTE — Plan of Care (Signed)
Problem: Phase II Progression Outcomes Goal: Obtain order to discontinue catheter if appropriate Outcome: Not Applicable Date Met:  77/41/28

## 2014-03-28 NOTE — Progress Notes (Signed)
Physical Therapy Treatment Patient Details Name: Sarah Johnson MRN: [id number] DOB: 12-Nov-1932 Today's Date: 03/28/2014    History of Present Illness 80 yo female admitted with sepsis.     PT Comments    Progressing with mobility. O2 sats dropped to 86% on 2L during ambulation.   Follow Up Recommendations  No PT follow up     Equipment Recommendations  None recommended by PT    Recommendations for Other Services OT consult     Precautions / Restrictions Precautions Precautions: Fall Precaution Comments: monitor O2 sats Restrictions Weight Bearing Restrictions: No    Mobility  Bed Mobility Overal bed mobility: Modified Independent                Transfers Overall transfer level: Modified independent                  Ambulation/Gait Ambulation/Gait assistance: Min guard Ambulation Distance (Feet): 250 Feet Assistive device: None Gait Pattern/deviations: Drifts right/left;Step-through pattern     General Gait Details: unsteady at times but no LOB. O2 sats 86% on 2L O2. Noted wheezing and mild dyspnea with ambulation.   Stairs            Wheelchair Mobility    Modified Rankin (Stroke Patients Only)       Balance                                    Cognition Arousal/Alertness: Awake/alert Behavior During Therapy: WFL for tasks assessed/performed Overall Cognitive Status: Within Functional Limits for tasks assessed                      Exercises      General Comments        Pertinent Vitals/Pain Pain Assessment: No/denies pain    Home Living                      Prior Function            PT Goals (current goals can now be found in the care plan section) Progress towards PT goals: Progressing toward goals    Frequency  Min 3X/week    PT Plan Current plan remains appropriate    Co-evaluation             End of Session Equipment Utilized During Treatment: Gait  belt;Oxygen Activity Tolerance: Patient tolerated treatment well Patient left: in chair;with call bell/phone within reach     Time: 0955-1018 PT Time Calculation (min) (ACUTE ONLY): 23 min  Charges:  $Gait Training: 23-37 mins                    G Codes:      Weston Anna, MPT Pager: [phone]

## 2014-03-28 NOTE — Plan of Care (Signed)
Problem: Phase I Progression Outcomes Goal: OOB as tolerated unless otherwise ordered Outcome: Completed/Met Date Met:  03/28/14 Goal: Voiding-avoid urinary catheter unless indicated Outcome: Progressing Goal: Other Phase I Outcomes/Goals Outcome: Completed/Met Date Met:  03/28/14

## 2014-03-29 ENCOUNTER — Telehealth: Payer: Self-pay | Admitting: Critical Care Medicine

## 2014-03-29 DIAGNOSIS — A0472 Enterocolitis due to Clostridium difficile, not specified as recurrent: Secondary | ICD-10-CM | POA: Insufficient documentation

## 2014-03-29 LAB — CBC
HCT: 32.3 % — ABNORMAL LOW (ref 36.0–46.0)
Hemoglobin: 10.7 g/dL — ABNORMAL LOW (ref 12.0–15.0)
MCH: 29.2 pg (ref 26.0–34.0)
MCHC: 33.1 g/dL (ref 30.0–36.0)
MCV: 88 fL (ref 78.0–100.0)
PLATELETS: 260 10*3/uL (ref 150–400)
RBC: 3.67 MIL/uL — ABNORMAL LOW (ref 3.87–5.11)
RDW: 15.9 % — AB (ref 11.5–15.5)
WBC: 18.1 10*3/uL — ABNORMAL HIGH (ref 4.0–10.5)

## 2014-03-29 MED ORDER — PREDNISONE 10 MG PO TABS: 10.0000 mg | ORAL_TABLET | Freq: Every day | ORAL | Status: DC

## 2014-03-29 MED ORDER — SACCHAROMYCES BOULARDII 250 MG PO CAPS: 250.0000 mg | ORAL_CAPSULE | Freq: Two times a day (BID) | ORAL | Status: AC

## 2014-03-29 MED ORDER — IPRATROPIUM-ALBUTEROL 0.5-2.5 (3) MG/3ML IN SOLN
3.0000 mL | Freq: Three times a day (TID) | RESPIRATORY_TRACT | Status: DC
  Administered 2014-03-29 – 2014-03-31 (×5): 3 mL via RESPIRATORY_TRACT
  Filled 2014-03-29 (×3): qty 3

## 2014-03-29 MED ORDER — PREDNISONE 20 MG PO TABS: 20.0000 mg | ORAL_TABLET | Freq: Every day | ORAL | Status: DC

## 2014-03-29 MED ORDER — ENSURE COMPLETE PO LIQD: 237.0000 mL | Freq: Two times a day (BID) | ORAL | Status: DC | PRN

## 2014-03-29 MED ORDER — LEVOFLOXACIN 750 MG PO TABS: 750.0000 mg | ORAL_TABLET | ORAL | Status: DC

## 2014-03-29 MED ORDER — VANCOMYCIN 50 MG/ML ORAL SOLUTION: 125.0000 mg | Freq: Four times a day (QID) | ORAL | Status: DC

## 2014-03-29 MED ORDER — ACETAMINOPHEN 325 MG PO TABS: 650.0000 mg | ORAL_TABLET | ORAL | Status: DC | PRN

## 2014-03-29 NOTE — Telephone Encounter (Signed)
Called made [place] aware. Nothing further needed

## 2014-03-29 NOTE — Progress Notes (Signed)
Patient ID: Sarah Johnson, female   DOB: [birth year], 80 y.o.   MRN: [id number] [facility] PROGRESS NOTE  Sarah Johnson WUJ:[id number] DOB: 10/15/1932 DOA: 03/25/2014 PCP: Tommy Medal, MD  Brief narrative:    80 y.o. female with a PMH of emphysema, PE (no longer on Coumadin), COPD, status post porcine AVR, hypertension, hyperlipidemia, hypothyroidism, chronic kidney disease, recent trewatment with antibiotic for UTI who was brought to the ER by her daughters for evaluation of fever and lethargy. The patient had an episode of sepsis caused by diverticulitis last year, and the patient's daughters were concerned that she was developing this again as the symptoms were similar. In the ER, she has been incontinent of loose stools. The patient's daughters report that she has had a problem with fecal incontinence since last year when she was diagnosed with diverticulitis. Hospital course is complicated with persistent C.diff. Overnight, patient developed wheezing and was short of breath. CXR showed bilateral airspace disease stable on left and improving on the right. In addition, pt had minimal urine output. IV fluids given. Barrier to discharge - i spoke with the patient's daughter who did not feels safe her mother to go back home. Sarah Johnson has refused oxygen to have at home (we initially planned for discharge today). Patient quickly desaturates upon ambulation and requires continuous oxygen. Patient's daughter asked if we can have PT evaluation for safety and if possible d/c to SNF rather than home.   Assessment/Plan:    Principal Problem:  Sepsis from community-acquired pneumonia, Clostridium difficile infection - Although the patient's principal complaint on admission was fever and diarrhea, she was found to have a bilateral pneumonia on radiography. She also had increased work of breathing and worsening dyspnea consistent with pneumonia, but given recent treatment with antibiotics  for a urinary tract infection, Clostridium difficile colitis was considered and positive as expected. - we will continue levaquin IV for pneumonia while pt is in hospital  - stopped flagyl and started vanco PO per pharmacy recommendations since patient is considered to have severe C.diff (pt has WBC count greater than 15K persistently) - blood cultures to date are negative, legionella and strep penumonia are both negative, urine culture with multiple morphological species none predominant - lactic acid WNL. As mentioned above WBC count persistently high although it did trend down since admission from 28.5 to 18. - pt was transferred out of SDU 03/27/2014  Active Problems:  Anemia of chronic disease, CKD stage III - Drop in Hg since admission likely dilutional  - No signs of active bleeding    Thrombocytopenia - Drop in Plt since admission - initially thought to be from Lovenox but platelet count now normalized.   Hypothyroidism - Continue Synthroid. - TSH 8.55. Once acute infection treated, will need repeat TSH in 4 weeks and if still elevated, may need increase in Synthroid   Dementia - Continue Aricept and Namenda.   Hyperlipidemia - Continue Lipitor.   Essential hypertension - cozaar and norvasc on hold since BP was on soft side. Continue metoprolol 25 mg PO BID.  - BP 138/65 this morning    GERD - Stopped PPI as C. Diff positive. May use pepcid.   Acute renal failure in the setting of Stage III CKD (chronic kidney disease) - The patient's baseline creatinine is 0.89. Cr 1.89 on admission.  - Cr trending down since admission: 1.89 --> 1.58 --> 1.39 --> 1.34 - obtain BMP in am - we are holding cozaar which may have contributed to  higher creatinine values.  COPD exacerbation  - Continue nebulized bronchodilator therapy. Continue Dulera. - on prednisone taper - continue supplemental oxygen    DVT prophylaxis - Lovenox SQ stopped due to thrombocytopenia and  using SCD's bilaterally    Moderate PCM - In the context of acute illness - diet as pt able to tolerate  Code Status: Full Family Communication: daughter at the bedside  Disposition Plan: possible SNF versus home depending on 2nd PT evaluation     IV access:   Peripheral IV  Procedures and diagnostic studies:    Ct Abdomen Pelvis Wo Contrast 03/25/2014  Patchy right middle lobe opacities suspicious for infection/pneumonia, including atypical mycobacterium.  No evidence of acute abnormality within the abdomen or pelvis.  Colonic diverticulosis without diverticulitis.  Cholelithiasis without CT evidence of acute cholecystitis.  Unchanged evidence of aortic repair.  Supraumbilical and right inguinal hernias containing fat.   Dg Chest 1 View 03/27/2014    Bilateral airspace disease is stable on the left but improved on the right.     Dg Chest Port 1 View 03/25/2014  Concern for bilateral airspace disease, right side greater the left. Findings could represent pneumonia. Recommend follow up to ensure resolution.     Medical Consultants:   None   Other Consultants:   Physical therapy  IAnti-Infectives:    Levaquin 11/30 -->   Flagyl 11/30 --> 03/28/2014  Vanco PO 03/28/2014 -->    [name], MD  [facility] Pager [phone]  If 7PM-7AM, please contact night-coverage [email or web address] Password [email or web address] 03/29/2014, 3:13 PM   LOS: 4 days    HPI/Subjective: No acute overnight events.  Objective: Filed Vitals:   03/29/14 0720 03/29/14 1023 03/29/14 1100 03/29/14 1410  BP:  138/65  98/81  Pulse:  84  84  Temp:   95 F (35 C) 97.8 F (36.6 C)  TempSrc:    Oral  Resp:    20  Height:      Weight:      SpO2: 96% 95%  96%    Intake/Output Summary (Last 24 hours) at 03/29/14 1513 Last data filed at 03/29/14 1200  Gross per 24 hour  Intake   1230 ml  Output      2 ml  Net   1228 ml    Exam:   General:  Pt is alert, follows commands appropriately, not in  acute distress  Cardiovascular: Regular rate and rhythm, S1/S2, no murmurs  Respiratory: Clear to auscultation bilaterally, no wheezing, no crackles, no rhonchi  Abdomen: Soft, non tender, non distended, bowel sounds present  Extremities: No edema, pulses DP and PT palpable bilaterally  Neuro: Grossly nonfocal  Data Reviewed: Basic Metabolic Panel:  Recent Labs Lab 03/25/14 2033 03/26/14 0345 03/27/14 0340 03/28/14 0453  NA 134* 137 140 144  K 4.2 4.2 4.2 3.7  CL 98 103 107 110  CO2 20 20 19 19   GLUCOSE 135* 119* 140* 135*  BUN 18 18 20 22   CREATININE 1.89* 1.58* 1.39* 1.34*  CALCIUM 9.3 8.2* 8.5 8.1*   Liver Function Tests:  Recent Labs Lab 03/25/14 2033  AST 30  ALT 10  ALKPHOS 82  BILITOT 1.0  PROT 7.7  ALBUMIN 3.3*   No results for input(s): LIPASE, AMYLASE in the last 168 hours. No results for input(s): AMMONIA in the last 168 hours. CBC:  Recent Labs Lab 03/25/14 2033 03/26/14 0345 03/27/14 0340 03/28/14 0453 03/29/14 0958  WBC 28.5* 26.5* 17.9* 17.4* 18.1*  NEUTROABS 22.8*  --   --   --   --  HGB 12.2 10.6* 10.3* 9.7* 10.7*  HCT 37.0 33.8* 31.8* 29.6* 32.3*  MCV 90.2 91.8 89.3 87.6 88.0  PLT 174 PLATELET CLUMPS NOTED ON SMEAR, UNABLE TO ESTIMATE 128* 235 260   Cardiac Enzymes: No results for input(s): CKTOTAL, CKMB, CKMBINDEX, TROPONINI in the last 168 hours. BNP: Invalid input(s): POCBNP CBG: No results for input(s): GLUCAP in the last 168 hours.  Recent Results (from the past 240 hour(s))  Blood Culture (routine x 2)     Status: None (Preliminary result)   Collection Time: 03/25/14  9:07 PM  Result Value Ref Range Status   Specimen Description BLOOD RIGHT WRIST  Final   Special Requests BOTTLES DRAWN AEROBIC AND ANAEROBIC 3CC  Final   Culture  Setup Time   Final    03/26/2014 01:03 Performed at [facility]    Culture   Final           BLOOD CULTURE RECEIVED NO GROWTH TO DATE CULTURE WILL BE HELD FOR 5 DAYS BEFORE ISSUING  A FINAL NEGATIVE REPORT Performed at [facility]    Report Status PENDING  Incomplete  Blood Culture (routine x 2)     Status: None (Preliminary result)   Collection Time: 03/25/14  9:58 PM  Result Value Ref Range Status   Specimen Description BLOOD LEFT ANTECUBITAL  Final   Special Requests BOTTLES DRAWN AEROBIC AND ANAEROBIC 5ML  Final   Culture  Setup Time   Final    03/26/2014 01:03 Performed at [facility]    Culture   Final           BLOOD CULTURE RECEIVED NO GROWTH TO DATE CULTURE WILL BE HELD FOR 5 DAYS BEFORE ISSUING A FINAL NEGATIVE REPORT Performed at [facility]    Report Status PENDING  Incomplete  MRSA PCR Screening     Status: None   Collection Time: 03/26/14  2:03 AM  Result Value Ref Range Status   MRSA by PCR NEGATIVE NEGATIVE Final    Comment:        The GeneXpert MRSA Assay (FDA approved for NASAL specimens only), is one component of a comprehensive MRSA colonization surveillance program. It is not intended to diagnose MRSA infection nor to guide or monitor treatment for MRSA infections.   Urine culture     Status: None   Collection Time: 03/26/14  4:15 AM  Result Value Ref Range Status   Specimen Description URINE, CLEAN CATCH  Final   Special Requests NONE  Final   Culture  Setup Time   Final    03/26/2014 08:44 Performed at [place]   Final    40,000 COLONIES/ML Performed at [facility]    Culture   Final    Multiple bacterial morphotypes present, none predominant. Suggest appropriate recollection if clinically indicated. Performed at [facility]    Report Status 03/27/2014 FINAL  Final  Clostridium Difficile by PCR     Status: Abnormal   Collection Time: 03/26/14  8:56 AM  Result Value Ref Range Status   C difficile by pcr POSITIVE (A) NEGATIVE Final    Comment: CRITICAL RESULT CALLED TO, READ BACK BY AND VERIFIED WITH: Lady Deutscher RN 11:55 03/26/14  ([email or web address]) Performed at [facility]      Scheduled Meds: . antiseptic oral rinse  7 mL Mouth Rinse BID  . aspirin EC  81 mg Oral Daily  . atorvastatin  20 mg Oral Daily  .  calcium-vitamin D  1 tablet Oral Daily  . donepezil  10 mg Oral QHS  . ipratropium-albuterol  3 mL Nebulization QID  . levofloxacin (LEVAQUIN) IV  750 mg Intravenous Q48H  . levothyroxine  125 mcg Oral QAC breakfast  . Memantine HCl ER  28 mg Oral Daily  . metoprolol  25 mg Oral BID  . mometasone-formoterol  2 puff Inhalation BID  . multivitamin with minerals  1 tablet Oral Daily  . [START ON 03/30/2014] predniSONE  20 mg Oral Q breakfast   Followed by  . [START ON 03/31/2014] predniSONE  10 mg Oral Q breakfast  . saccharomyces boulardii  250 mg Oral BID  . sertraline  25 mg Oral Daily  . vancomycin  125 mg Oral QID   Continuous Infusions: . sodium chloride 50 mL/hr at 03/29/14 1029

## 2014-03-29 NOTE — Plan of Care (Signed)
Problem: Phase II Progression Outcomes Goal: Discharge plan established Outcome: Completed/Met Date Met:  03/29/14

## 2014-03-29 NOTE — Progress Notes (Signed)
03/29/14 RECEIVED CALL FROM [facility] DME REP [place] THAT PATIENT DECLINED HOME 02.WENT TO PATIENT'S RM, SPOKE TO PATIENT/& DTR'S IN RM ABOUT D/C PLANS.FAMILY WANTED TO TALK TO ATTENDING.AFTER TALKING TO ATTENDING.MD WANTED PT TO RE-EVAL, D/C ORDER CANCELLED,& CSW NOTIFIED OF POSSIBLE REFERRAL IF HIGHER LEVEL OF CARE NEEDED PER PT RE EVAL.WILL AWAIT PT RECOMMENDATIONS.MEDICARE IM NOTICE GIVEN.

## 2014-03-29 NOTE — Telephone Encounter (Signed)
No real recs  Need to get hosp md to consult pulm

## 2014-03-29 NOTE — Discharge Instructions (Signed)
Clostridium Difficile Infection °Clostridium difficile (C. difficile) is a bacteria found in the intestinal tract or colon. Under certain conditions, it causes diarrhea and sometimes severe disease. The severe form of the disease is known as pseudomembranous colitis (often called C. difficile colitis). This disease can damage the lining of the colon or cause the colon to become enlarged (toxic megacolon). °CAUSES °Your colon normally contains many different bacteria, including C. difficile. The balance of bacteria in your colon can change during illness. This is especially true when you take antibiotic medicine. Taking antibiotics may allow the C. difficile to grow, multiply excessively, and make a toxin that then causes illness. The elderly and people with certain medical conditions have a greater risk of getting C. difficile infections. °SYMPTOMS °· Watery diarrhea. °· Fever. °· Fatigue. °· Loss of appetite. °· Nausea. °· Abdominal swelling, pain, or tenderness. °· Dehydration. °DIAGNOSIS °Your symptoms may make your caregiver suspect a C. difficile infection, especially if you have used antibiotics in the preceding weeks. However, there are only 2 ways to know for certain whether you have a C. difficile infection: °· A lab test that finds the toxin in your stool. °· The specific appearance of an abnormality (pseudomembrane) in your colon. This can only be seen by doing a sigmoidoscopy or colonoscopy. These procedures involve passing an instrument through your rectum to look at the inside of your colon. °Your caregiver will help determine if these tests are necessary. °TREATMENT °· Most people are successfully treated with one of two specific antibiotics, usually given by mouth. Other antibiotics you are receiving are stopped if possible. °· Intravenous (IV) fluids and correction of electrolyte imbalance may be necessary. °· Rarely, surgery may be needed to remove the infected part of the intestines. °· Careful  hand washing by you and your caregivers is important to prevent the spread of infection. In the hospital, your caregivers may also put on gowns and gloves to prevent the spread of the C. difficile bacteria. Your room is also cleaned regularly with a solution containing bleach or a product that is known to kill C. difficile. °HOME CARE INSTRUCTIONS °· Drink enough fluids to keep your urine clear or pale yellow. Avoid milk, caffeine, and alcohol. °· Ask your caregiver for specific rehydration instructions. °· Try eating small, frequent meals rather than large meals. °· Take your antibiotics as directed. Finish them even if you start to feel better. °· Do not use medicines to slow diarrhea. This could delay healing or cause complications. °· Wash your hands thoroughly after using the bathroom and before preparing food. °· Make sure people who live with you wash their hands often, too. °· Carefully disinfect all surfaces with a product that contains chlorine bleach. °SEEK MEDICAL CARE IF: °· Diarrhea persists longer than expected or recurs after completing your course of antibiotic treatment for the C. difficile infection. °· You have trouble staying hydrated. °SEEK IMMEDIATE MEDICAL CARE IF: °· You develop a new fever. °· You have increasing abdominal pain or tenderness. °· There is blood in your stools, or your stools are dark black and tarry. °· You cannot hold down food or liquids. °MAKE SURE YOU: °· Understand these instructions. °· Will watch your condition. °· Will get help right away if you are not doing well or get worse. °Document Released: 01/20/2005 Document Revised: 08/27/2013 Document Reviewed: 09/18/2010 °ExitCare® Patient Information ©2015 [employer]. This information is not intended to replace advice given to you by your health care provider. Make sure you   discuss any questions you have with your health care provider. Pneumonia Pneumonia is an infection of the lungs.  CAUSES Pneumonia may be  caused by bacteria or a virus. Usually, these infections are caused by breathing infectious particles into the lungs (respiratory tract). SIGNS AND SYMPTOMS   Cough.  Fever.  Chest pain.  Increased rate of breathing.  Wheezing.  Mucus production. DIAGNOSIS  If you have the common symptoms of pneumonia, your health care provider will typically confirm the diagnosis with a chest X-ray. The X-ray will show an abnormality in the lung (pulmonary infiltrate) if you have pneumonia. Other tests of your blood, urine, or sputum may be done to find the specific cause of your pneumonia. Your health care provider may also do tests (blood gases or pulse oximetry) to see how well your lungs are working. TREATMENT  Some forms of pneumonia may be spread to other people when you cough or sneeze. You may be asked to wear a mask before and during your exam. Pneumonia that is caused by bacteria is treated with antibiotic medicine. Pneumonia that is caused by the influenza virus may be treated with an antiviral medicine. Most other viral infections must run their course. These infections will not respond to antibiotics.  HOME CARE INSTRUCTIONS   Cough suppressants may be used if you are losing too much rest. However, coughing protects you by clearing your lungs. You should avoid using cough suppressants if you can.  Your health care provider may have prescribed medicine if he or she thinks your pneumonia is caused by bacteria or influenza. Finish your medicine even if you start to feel better.  Your health care provider may also prescribe an expectorant. This loosens the mucus to be coughed up.  Take medicines only as directed by your health care provider.  Do not smoke. Smoking is a common cause of bronchitis and can contribute to pneumonia. If you are a smoker and continue to smoke, your cough may last several weeks after your pneumonia has cleared.  A cold steam vaporizer or humidifier in your room or  home may help loosen mucus.  Coughing is often worse at night. Sleeping in a semi-upright position in a recliner or using a couple pillows under your head will help with this.  Get rest as you feel it is needed. Your body will usually let you know when you need to rest. PREVENTION A pneumococcal shot (vaccine) is available to prevent a common bacterial cause of pneumonia. This is usually suggested for:  People over 59 years old.  Patients on chemotherapy.  People with chronic lung problems, such as bronchitis or emphysema.  People with immune system problems. If you are over 65 or have a high risk condition, you may receive the pneumococcal vaccine if you have not received it before. In some countries, a routine influenza vaccine is also recommended. This vaccine can help prevent some cases of pneumonia.You may be offered the influenza vaccine as part of your care. If you smoke, it is time to quit. You may receive instructions on how to stop smoking. Your health care provider can provide medicines and counseling to help you quit. SEEK MEDICAL CARE IF: You have a fever. SEEK IMMEDIATE MEDICAL CARE IF:   Your illness becomes worse. This is especially true if you are elderly or weakened from any other disease.  You cannot control your cough with suppressants and are losing sleep.  You begin coughing up blood.  You develop pain which is getting  worse or is uncontrolled with medicines.  Any of the symptoms which initially brought you in for treatment are getting worse rather than better.  You develop shortness of breath or chest pain. MAKE SURE YOU:   Understand these instructions.  Will watch your condition.  Will get help right away if you are not doing well or get worse. Document Released: 04/12/2005 Document Revised: 08/27/2013 Document Reviewed: 07/02/2010 [facility] Patient Information 2015 [place], [place]. This information is not intended to replace advice given to you by  your health care provider. Make sure you discuss any questions you have with your health care provider.

## 2014-03-29 NOTE — Progress Notes (Addendum)
Physical Therapy Treatment Patient Details Name: Sarah Johnson MRN: [id number] DOB: [birth year] Today's Date: 03/29/2014    History of Present Illness 80 yo female admitted with sepsis.     PT Comments    Min assist on today for mobility. Discussed d/c plan-pt states she plans to return home and is open to home health but she doesn't want to have to take O2 home with her (fearful she will have to wear it forever). Attempted to explain to her that sometimes that isn't the case and that it's important for her to use it if MD feels she needs it. Also mentioned that she may need to use a walker for short term until mobility and activity tolerance improve. Encouraged pt to walk with nursing over weekend, at least once each day.  Follow Up Recommendations  Home health PT;Supervision - Intermittent; Home Health Aide     Equipment Recommendations  Rolling walker with 5" wheels    Recommendations for Other Services OT consult     Precautions / Restrictions Precautions Precautions: Fall Precaution Comments: monitor O2 sats Restrictions Weight Bearing Restrictions: No    Mobility  Bed Mobility Overal bed mobility: Modified Independent                Transfers     Transfers: Sit to/from Stand Sit to Stand: Min assist         General transfer comment: assist to steady  Ambulation/Gait Ambulation/Gait assistance: Min assist Ambulation Distance (Feet): 250 Feet Assistive device:  (IV pole) Gait Pattern/deviations: Step-through pattern;Decreased stride length;Staggering left;Staggering right;Drifts right/left     General Gait Details: Unsteady at times. LOB with first few steps in room requiring external assist to prevent fall however progressed to Min guard assist with support of IV pole. O2 sats 84% on 2L O2 during ambulation. Dyspnea 2/4   Stairs            Wheelchair Mobility    Modified Rankin (Stroke Patients Only)       Balance                                     Cognition Arousal/Alertness: Awake/alert Behavior During Therapy: WFL for tasks assessed/performed Overall Cognitive Status: Within Functional Limits for tasks assessed                      Exercises General Exercises - Lower Extremity Hip ABduction/ADduction: AROM;Both;10 reps;Standing Hip Flexion/Marching: AROM;Both;10 reps;Standing Heel Raises: AROM;Both;10 reps;Standing Mini-Sqauts: AROM;10 reps;Standing    General Comments        Pertinent Vitals/Pain Pain Assessment: No/denies pain    Home Living                      Prior Function            PT Goals (current goals can now be found in the care plan section) Progress towards PT goals: Progressing toward goals    Frequency  Min 3X/week    PT Plan Current plan remains appropriate    Co-evaluation             End of Session Equipment Utilized During Treatment: Gait belt;Oxygen Activity Tolerance: Patient tolerated treatment well Patient left: in bed;with call bell/phone within reach     Time: 2355-7322 PT Time Calculation (min) (ACUTE ONLY): 25 min  Charges:  $Gait Training: 23-37 mins  G Codes:      Weston Anna, MPT Pager: [phone]

## 2014-03-29 NOTE — Plan of Care (Signed)
Problem: Phase III Progression Outcomes Goal: Voiding independently Outcome: Completed/Met Date Met:  03/29/14 Goal: Foley discontinued Outcome: Not Applicable Date Met:  14/84/03

## 2014-03-29 NOTE — Progress Notes (Signed)
SATURATION QUALIFICATIONS: (This note is used to comply with regulatory documentation for home oxygen)  Patient Saturations on Room Air at Rest = 95%  Patient Saturations on Room Air while Ambulating = 74%  Patient Saturations on 2 Liters of oxygen while Ambulating = 86%  Please briefly explain why patient needs home oxygen:

## 2014-03-29 NOTE — Telephone Encounter (Signed)
Spoke with pt's daughter Drue Dun, states that pt is currently admitted at [facility] for pna, uti, and cDiff. Her 02 while there has been about 95% on room air, but dropped to 77% with ambulation.  Was placed on 2lpm, 02 stayed at 86% with ambulation.  Was told that she could be released today if she went home on 02, pt stated that "she would rather die than wear oxygen".  Because of this, they have decided to keep her at the hospital for a couple more days. It was also mentioned that pt could possibly sent to a rehab facility before coming home.  Pt's daughter is concerned that pt will be noncompliant with recs from a dr that she does not know, wanted to know what PW thought of her condition.  I advised her that PW had not seen pt since 10/2011.  Pt still wanted to at least make PW aware and see if he had any recommendations for pt.    Dr Joya Gaskins, fyi, and please share any recs if possible.  Thank you!

## 2014-03-30 DIAGNOSIS — I10 Essential (primary) hypertension: Secondary | ICD-10-CM

## 2014-03-30 LAB — CBC
HCT: 33.3 % — ABNORMAL LOW (ref 36.0–46.0)
Hemoglobin: 11 g/dL — ABNORMAL LOW (ref 12.0–15.0)
MCH: 28.8 pg (ref 26.0–34.0)
MCHC: 33 g/dL (ref 30.0–36.0)
MCV: 87.2 fL (ref 78.0–100.0)
Platelets: 235 10*3/uL (ref 150–400)
RBC: 3.82 MIL/uL — ABNORMAL LOW (ref 3.87–5.11)
RDW: 15.9 % — ABNORMAL HIGH (ref 11.5–15.5)
WBC: 16.8 10*3/uL — ABNORMAL HIGH (ref 4.0–10.5)

## 2014-03-30 LAB — BASIC METABOLIC PANEL
Anion gap: 11 (ref 5–15)
BUN: 20 mg/dL (ref 6–23)
CO2: 22 mEq/L (ref 19–32)
Calcium: 8.8 mg/dL (ref 8.4–10.5)
Chloride: 106 mEq/L (ref 96–112)
Creatinine, Ser: 1.39 mg/dL — ABNORMAL HIGH (ref 0.50–1.10)
GFR, EST AFRICAN AMERICAN: 41 mL/min — AB (ref >90)
GFR, EST NON AFRICAN AMERICAN: 35 mL/min — AB (ref >90)
Glucose, Bld: 88 mg/dL (ref 70–99)
Potassium: 3.7 mEq/L (ref 3.7–5.3)
Sodium: 139 mEq/L (ref 137–147)

## 2014-03-30 MED ORDER — FAMOTIDINE 20 MG PO TABS
20.0000 mg | ORAL_TABLET | Freq: Two times a day (BID) | ORAL | Status: DC
  Administered 2014-03-30 – 2014-03-31 (×2): 20 mg via ORAL
  Filled 2014-03-30 (×2): qty 1

## 2014-03-30 NOTE — Progress Notes (Signed)
Patient ID: Sarah Johnson, female   DOB: Oct 19, 1932, 80 y.o.   MRN: [id number] [facility] PROGRESS NOTE  Sarah Johnson [id number] DOB: [birth year] DOA: 03/25/2014 PCP: Tommy Medal, MD  Brief narrative:  80 y.o. female with a PMH of emphysema, PE (no longer on Coumadin), COPD, status post porcine AVR, hypertension, hyperlipidemia, hypothyroidism, chronic kidney disease, recent trewatment with antibiotic for UTI who was brought to the ER by her daughters for evaluation of fever and lethargy. The patient had an episode of sepsis caused by diverticulitis last year, and the patient's daughters were concerned that she was developing this again as the symptoms were similar. In the ER, she has been incontinent of loose stools. The patient's daughters report that she has had a problem with fecal incontinence since last year when she was diagnosed with diverticulitis. Hospital course is complicated with persistent C.diff. She is on vanco PO for this. Physical therapy has evaluated the patient twice with recommendation to have HHPT.    Assessment and Plan:     Principal Problem:  Sepsis from community-acquired pneumonia, Clostridium difficile infection - Although the patient's principal complaint on admission was fever and diarrhea, she was found to have a bilateral pneumonia on radiography. She also had increased work of breathing and worsening dyspnea consistent with pneumonia, but given recent treatment with antibiotics for a urinary tract infection, Clostridium difficile colitis was considered and positive as expected. - patient is on Levaquin IV for pneumonia. Repeat CXR showed some improvement in bilateral airspace disease. - stopped flagyl and started vanco PO per pharmacy recommendations since patient is considered to have severe C.diff (pt has WBC count greater than 15K persistently) - blood cultures to date are negative, legionella and strep penumonia are both negative, urine  culture with multiple morphological species none predominant - lactic acid WNL. As mentioned above WBC count persistently high although it did trend down since admission from 28.5 to 16.8 - pt was transferred out of SDU 03/27/2014  Active Problems:  Anemia of chronic disease, CKD stage III - Drop in Hg since admission likely dilutional  - No signs of active bleeding  - hemoglobin is 11 this morning    Thrombocytopenia - Drop in Plt since admission - initially thought to be from Lovenox but platelet count now normalized.   Hypothyroidism - Continue Synthroid. - TSH 8.55. Once acute infection treated, will need repeat TSH in 4 weeks and if still elevated, may need increase in Synthroid   Dementia - Continue Aricept and Namenda.   Hyperlipidemia - Continue Lipitor.   Essential hypertension - cozaar and norvasc on hold since BP was on soft side. Continue metoprolol 25 mg PO BID.  - BP 138/65 this morning    GERD - Stopped PPI as C. Diff positive. Start pepcid twice a day.    Acute renal failure in the setting of Stage III CKD (chronic kidney disease) - The patient's baseline creatinine is 0.89. Cr 1.89 on admission.  - Cr trended down since admission: 1.89 --> 1.58 --> 1.39 --> 1.34 - We are holding cozaar which may have contributed to higher creatinine values.  COPD exacerbation  - Continue nebulized bronchodilator therapy. Continue Dulera. - on prednisone taper - continue supplemental oxygen    DVT prophylaxis - Lovenox SQ stopped due to thrombocytopenia and using SCD's bilaterally    Moderate PCM - In the context of acute illness - diet as pt able to tolerate  Code Status: Full Family Communication: daughter at the bedside  Disposition Plan: home in next 24 hours    IV access:   Peripheral IV  Procedures and diagnostic studies:   Ct Abdomen Pelvis Wo Contrast 03/25/2014 Patchy right middle lobe opacities suspicious for  infection/pneumonia, including atypical mycobacterium. No evidence of acute abnormality within the abdomen or pelvis. Colonic diverticulosis without diverticulitis. Cholelithiasis without CT evidence of acute cholecystitis. Unchanged evidence of aortic repair. Supraumbilical and right inguinal hernias containing fat.   Dg Chest 1 View 03/27/2014 Bilateral airspace disease is stable on the left but improved on the right.   Dg Chest Port 1 View 03/25/2014 Concern for bilateral airspace disease, right side greater the left. Findings could represent pneumonia. Recommend follow up to ensure resolution.    Medical Consultants:   None  Other Consultants:   Physical therapy  IAnti-Infectives:    Levaquin 11/30 -->   Flagyl 11/30 --> 03/28/2014  Vanco PO 03/28/2014 -->   Faye Ramsay, MD  [facility] Pager [phone]  If 7PM-7AM, please contact night-coverage [email or web address] Password [facility] 03/30/2014, 5:41 PM   LOS: 5 days   HPI/Subjective: No events overnight.   Objective: Filed Vitals:   03/30/14 0520 03/30/14 0916 03/30/14 1523 03/30/14 1528  BP: 125/68  130/63   Pulse: 65  68   Temp: 98.3 F (36.8 C)  98.4 F (36.9 C)   TempSrc: Oral  Oral   Resp: 20  20   Height:      Weight:      SpO2: 98% 98% 94% 95%    Intake/Output Summary (Last 24 hours) at 03/30/14 1741 Last data filed at 03/30/14 1240  Gross per 24 hour  Intake   1084 ml  Output      0 ml  Net   1084 ml    Exam:   General:  Pt is alert, not in acute distress  Cardiovascular: Regular rate and rhythm, S1/S2 appreciated  Respiratory:  no wheezing, no crackles, no rhonchi  Abdomen: Soft, non tender, non distended, bowel sounds present, no guarding  Extremities: pulses DP and PT palpable bilaterally  Neuro: Grossly nonfocal  Data Reviewed: Basic Metabolic Panel:  Recent Labs Lab 03/25/14 2033 03/26/14 0345 03/27/14 0340 03/28/14 0453 03/30/14 0432  NA 134* 137 140 144 139   K 4.2 4.2 4.2 3.7 3.7  CL 98 103 107 110 106  CO2 20 20 19 19 22   GLUCOSE 135* 119* 140* 135* 88  BUN 18 18 20 22 20   CREATININE 1.89* 1.58* 1.39* 1.34* 1.39*  CALCIUM 9.3 8.2* 8.5 8.1* 8.8   Liver Function Tests:  Recent Labs Lab 03/25/14 2033  AST 30  ALT 10  ALKPHOS 82  BILITOT 1.0  PROT 7.7  ALBUMIN 3.3*   No results for input(s): LIPASE, AMYLASE in the last 168 hours. No results for input(s): AMMONIA in the last 168 hours. CBC:  Recent Labs Lab 03/25/14 2033 03/26/14 0345 03/27/14 0340 03/28/14 0453 03/29/14 0958 03/30/14 1058  WBC 28.5* 26.5* 17.9* 17.4* 18.1* 16.8*  NEUTROABS 22.8*  --   --   --   --   --   HGB 12.2 10.6* 10.3* 9.7* 10.7* 11.0*  HCT 37.0 33.8* 31.8* 29.6* 32.3* 33.3*  MCV 90.2 91.8 89.3 87.6 88.0 87.2  PLT 174 PLATELET CLUMPS NOTED ON SMEAR, UNABLE TO ESTIMATE 128* 235 260 235   Cardiac Enzymes: No results for input(s): CKTOTAL, CKMB, CKMBINDEX, TROPONINI in the last 168 hours. BNP: Invalid input(s): POCBNP CBG: No results for input(s): GLUCAP in the last 168 hours.  Recent Results (from the past 240 hour(s))  Blood Culture (routine x 2)     Status: None (Preliminary result)   Collection Time: 03/25/14  9:07 PM  Result Value Ref Range Status   Specimen Description BLOOD RIGHT WRIST  Final   Special Requests BOTTLES DRAWN AEROBIC AND ANAEROBIC 3CC  Final   Culture  Setup Time   Final    03/26/2014 01:03 Performed at [facility]    Culture   Final           BLOOD CULTURE RECEIVED NO GROWTH TO DATE CULTURE WILL BE HELD FOR 5 DAYS BEFORE ISSUING A FINAL NEGATIVE REPORT Performed at [facility]    Report Status PENDING  Incomplete  Blood Culture (routine x 2)     Status: None (Preliminary result)   Collection Time: 03/25/14  9:58 PM  Result Value Ref Range Status   Specimen Description BLOOD LEFT ANTECUBITAL  Final   Special Requests BOTTLES DRAWN AEROBIC AND ANAEROBIC 5ML  Final   Culture  Setup Time   Final     03/26/2014 01:03 Performed at [facility]    Culture   Final           BLOOD CULTURE RECEIVED NO GROWTH TO DATE CULTURE WILL BE HELD FOR 5 DAYS BEFORE ISSUING A FINAL NEGATIVE REPORT Performed at [facility]    Report Status PENDING  Incomplete  MRSA PCR Screening     Status: None   Collection Time: 03/26/14  2:03 AM  Result Value Ref Range Status   MRSA by PCR NEGATIVE NEGATIVE Final    Comment:        The GeneXpert MRSA Assay (FDA approved for NASAL specimens only), is one component of a comprehensive MRSA colonization surveillance program. It is not intended to diagnose MRSA infection nor to guide or monitor treatment for MRSA infections.   Urine culture     Status: None   Collection Time: 03/26/14  4:15 AM  Result Value Ref Range Status   Specimen Description URINE, CLEAN CATCH  Final   Special Requests NONE  Final   Culture  Setup Time   Final    03/26/2014 08:44 Performed at [facility]   Final    40,000 COLONIES/ML Performed at [facility]    Culture   Final    Multiple bacterial morphotypes present, none predominant. Suggest appropriate recollection if clinically indicated. Performed at [facility]    Report Status 03/27/2014 FINAL  Final  Clostridium Difficile by PCR     Status: Abnormal   Collection Time: 03/26/14  8:56 AM  Result Value Ref Range Status   C difficile by pcr POSITIVE (A) NEGATIVE Final    Comment: CRITICAL RESULT CALLED TO, READ BACK BY AND VERIFIED WITH: Lady Deutscher RN 11:55 03/26/14 ([email or web address]) Performed at [facility]      Scheduled Meds: . antiseptic oral rinse  7 mL Mouth Rinse BID  . aspirin EC  81 mg Oral Daily  . atorvastatin  20 mg Oral Daily  . calcium-vitamin D  1 tablet Oral Daily  . donepezil  10 mg Oral QHS  . ipratropium-albuterol  3 mL Nebulization TID  . levofloxacin (LEVAQUIN) IV  750 mg Intravenous Q48H  . levothyroxine  125 mcg Oral QAC breakfast  .  Memantine HCl ER  28 mg Oral Daily  . metoprolol  25 mg Oral BID  . mometasone-formoterol  2 puff  Inhalation BID  . multivitamin with minerals  1 tablet Oral Daily  . [START ON 03/31/2014] predniSONE  10 mg Oral Q breakfast  . saccharomyces boulardii  250 mg Oral BID  . sertraline  25 mg Oral Daily  . vancomycin  125 mg Oral QID   Continuous Infusions: . sodium chloride 10 mL/hr at 03/29/14 1936

## 2014-03-30 NOTE — Plan of Care (Signed)
Problem: Phase II Progression Outcomes Goal: Vital signs remain stable Outcome: Completed/Met Date Met:  03/30/14 Goal: IV changed to normal saline lock Outcome: Completed/Met Date Met:  03/30/14 Goal: Other Phase II Outcomes/Goals Outcome: Completed/Met Date Met:  03/30/14

## 2014-03-30 NOTE — Progress Notes (Addendum)
Physical Therapy Treatment Patient Details Name: Sarah Johnson MRN: [id number] DOB: 1932/08/28 Today's Date: 03/30/2014   SATURATION QUALIFICATIONS: (This note is used to comply with regulatory documentation for home oxygen)  Patient Saturations on Room Air at Rest = 94%  Patient Saturations on Room Air while Ambulating = 82%  Patient Saturations on 2 Liters of oxygen while Ambulating = 90%     History of Present Illness 80 yo female admitted with sepsis.     PT Comments    Progressing well with mobility. Improved steadiness this session. Noted MD asking for eval for SNF: discussed this with pt who does not want rehab and does not have skilled PT need for SNF. Will recommend home health PT for home safety evaluation and follow, if needed. Pt is okay to ambulate with nursing supervision.  Follow Up Recommendations  Home health PT;Supervision - Intermittent     Equipment Recommendations  None recommended by PT    Recommendations for Other Services OT consult     Precautions / Restrictions Precautions Precautions: Fall Precaution Comments: monitor O2 sats Restrictions Weight Bearing Restrictions: No    Mobility  Bed Mobility Overal bed mobility: Modified Independent                Transfers Overall transfer level: Modified independent                  Ambulation/Gait Ambulation/Gait assistance: Supervision Ambulation Distance (Feet): 250 Feet Assistive device: None Gait Pattern/deviations: Step-through pattern;Decreased stride length     General Gait Details: No LOB this session. Good gait speed.    Stairs            Wheelchair Mobility    Modified Rankin (Stroke Patients Only)       Balance                                    Cognition Arousal/Alertness: Awake/alert Behavior During Therapy: WFL for tasks assessed/performed Overall Cognitive Status: Within Functional Limits for tasks assessed                       Exercises      General Comments        Pertinent Vitals/Pain Pain Assessment: No/denies pain    Home Living                      Prior Function            PT Goals (current goals can now be found in the care plan section) Progress towards PT goals: Progressing toward goals    Frequency  Min 3X/week    PT Plan Current plan remains appropriate    Co-evaluation             End of Session Equipment Utilized During Treatment: Gait belt;Oxygen Activity Tolerance: Patient tolerated treatment well Patient left: in bed;with call bell/phone within reach     Time: 1320-1344 PT Time Calculation (min) (ACUTE ONLY): 24 min  Charges:  $Gait Training: 23-37 mins                    G Codes:      Weston Anna, MPT Pager: [phone]

## 2014-03-30 NOTE — Clinical Social Work Note (Signed)
CSW was to follow up with re evaluation from PT  Reevaluation done on 03/30/14 and reviewed by CSW  Re evaluation dated 03/30/14 reflects [place] PT  No further CSW needs  CSW signing off  Dede Query, [place] Social Worker - Weekend Coverage cell #: [phone]

## 2014-03-31 LAB — CBC
HCT: 33.8 % — ABNORMAL LOW (ref 36.0–46.0)
Hemoglobin: 11.1 g/dL — ABNORMAL LOW (ref 12.0–15.0)
MCH: 29.2 pg (ref 26.0–34.0)
MCHC: 32.8 g/dL (ref 30.0–36.0)
MCV: 88.9 fL (ref 78.0–100.0)
Platelets: 189 10*3/uL (ref 150–400)
RBC: 3.8 MIL/uL — ABNORMAL LOW (ref 3.87–5.11)
RDW: 16 % — AB (ref 11.5–15.5)
WBC: 18.2 10*3/uL — AB (ref 4.0–10.5)

## 2014-03-31 LAB — BASIC METABOLIC PANEL
ANION GAP: 11 (ref 5–15)
BUN: 20 mg/dL (ref 6–23)
CHLORIDE: 107 meq/L (ref 96–112)
CO2: 24 mEq/L (ref 19–32)
CREATININE: 1.42 mg/dL — AB (ref 0.50–1.10)
Calcium: 9.2 mg/dL (ref 8.4–10.5)
GFR, EST AFRICAN AMERICAN: 40 mL/min — AB (ref >90)
GFR, EST NON AFRICAN AMERICAN: 34 mL/min — AB (ref >90)
Glucose, Bld: 85 mg/dL (ref 70–99)
Potassium: 3.7 mEq/L (ref 3.7–5.3)
Sodium: 142 mEq/L (ref 137–147)

## 2014-03-31 MED ORDER — LEVOFLOXACIN 750 MG PO TABS: 750.0000 mg | ORAL_TABLET | ORAL | Status: DC

## 2014-03-31 MED ORDER — VANCOMYCIN 50 MG/ML ORAL SOLUTION: 125.0000 mg | Freq: Four times a day (QID) | ORAL | Status: DC

## 2014-03-31 MED ORDER — HYDROCODONE-ACETAMINOPHEN 5-500 MG PO CAPS: 1.0000 | ORAL_CAPSULE | Freq: Four times a day (QID) | ORAL | Status: DC | PRN

## 2014-03-31 MED ORDER — METOPROLOL TARTRATE 25 MG PO TABS: 25.0000 mg | ORAL_TABLET | Freq: Two times a day (BID) | ORAL | Status: DC

## 2014-03-31 NOTE — Discharge Summary (Signed)
Physician Discharge Summary  SYMIA HERDT KGY:[id number] DOB: 07-11-1932 DOA: 03/25/2014  PCP: Tommy Medal, MD  Admit date: 03/25/2014 Discharge date: 03/31/2014  Recommendations for Outpatient Follow-up:  1. Pt will need to follow up with PCP in 2-3 weeks post discharge 2. Please obtain BMP to evaluate electrolytes and kidney function 3. Please also check CBC to evaluate Hg and Hct levels 4. Continue taking Levaquin and Vancomycin as noted below under medication section 5. Please note pt advised to stop taking PPI until C. Diff is resolved 6. Pt also advised to stop taking Losartan until renal function stabilizes   Discharge Diagnoses:  Principal Problem:   Sepsis Active Problems:   Hyperlipidemia   Essential hypertension   GERD   CKD (chronic kidney disease)   Diarrhea   CAP (community acquired pneumonia)   Acute renal failure   COPD exacerbation   Enteritis due to Clostridium difficile  Discharge Condition: Stable  Diet recommendation: Heart healthy diet discussed in details   Brief narrative: 80 y.o. female with a PMH of emphysema, PE (no longer on Coumadin), COPD, status post porcine AVR, hypertension, hyperlipidemia, hypothyroidism, chronic kidney disease, recent trewatment with antibiotic for UTI who was brought to the ER by her daughters for evaluation of fever and lethargy. The patient had an episode of sepsis caused by diverticulitis last year, and the patient's daughters were concerned that she was developing this again as the symptoms were similar. In the ER, she has been incontinent of loose stools. The patient's daughters report that she has had a problem with fecal incontinence since last year when she was diagnosed with diverticulitis. Hospital course is complicated with persistent C.diff. She is on vanco PO for this. Physical therapy has evaluated the patient twice with recommendation to have HHPT.   Assessment and Plan:   Principal Problem:  Sepsis  from community-acquired pneumonia, Clostridium difficile infection - Although the patient's principal complaint on admission was fever and diarrhea, she was found to have a bilateral pneumonia on radiography. She also had increased work of breathing and worsening dyspnea consistent with pneumonia, but given recent treatment with antibiotics for a urinary tract infection, Clostridium difficile colitis was considered and positive as expected. - patient is on Levaquin IV for pneumonia. Repeat CXR showed some improvement in bilateral airspace disease. - stopped flagyl and started vanco PO per pharmacy recommendations since patient is considered to have severe C.diff (pt has WBC count greater than 15K persistently) - blood cultures to date are negative, legionella and strep penumonia are both negative, urine culture with multiple morphological species none predominant - lactic acid WNL. As mentioned above WBC count persistently high although it did trend down since admission from 28.5 to 16.8 - pt was transferred out of SDU 03/27/2014 - pt is to continue taking Vanc as noted below, also continue taking Levaquin for 4 more doses   Active Problems:  Anemia of chronic disease, CKD stage III - Drop in Hg since admission likely dilutional  - No signs of active bleeding  - hemoglobin is 11 this morning    Thrombocytopenia - Drop in Plt since admission - initially thought to be from Lovenox but platelet count now normalized.   Hypothyroidism - Continue Synthroid. - TSH 8.55. Once acute infection treated, will need repeat TSH in 4 weeks and if still elevated, may need increase in Synthroid   Dementia - Continue Aricept and Namenda.   Hyperlipidemia - Continue Lipitor.   Essential hypertension - cozaar and norvasc on hold since  BP was on soft side. Continue metoprolol 25 mg PO BID.  - BP 138/65 this morning    GERD - Stopped PPI as C. Diff positive. Start pepcid twice a day.    Acute  renal failure in the setting of Stage III CKD (chronic kidney disease) - The patient's baseline creatinine is 0.89. Cr 1.89 on admission.  - Cr trended down since admission: 1.89 --> 1.58 --> 1.39 --> 1.34 - We are holding cozaar which may have contributed to higher creatinine values. - Cozaar can be resumed if needed in the near future once renal function stabilizes   COPD exacerbation  - Continue nebulized bronchodilator therapy. Continue Dulera. - on prednisone taper - continue supplemental oxygen    DVT prophylaxis - Lovenox SQ stopped due to thrombocytopenia and using SCD's bilaterally while hospitalized    Moderate PCM - In the context of acute illness - diet as pt able to tolerate  Code Status: Full Family Communication: daughter at the bedside  Disposition Plan: home with [facility] PT   IV access:   Peripheral IV  Procedures and diagnostic studies:   Ct Abdomen Pelvis Wo Contrast 03/25/2014 Patchy right middle lobe opacities suspicious for infection/pneumonia, including atypical mycobacterium. No evidence of acute abnormality within the abdomen or pelvis. Colonic diverticulosis without diverticulitis. Cholelithiasis without CT evidence of acute cholecystitis. Unchanged evidence of aortic repair. Supraumbilical and right inguinal hernias containing fat.   Dg Chest 1 View 03/27/2014 Bilateral airspace disease is stable on the left but improved on the right.   Dg Chest Port 1 View 03/25/2014 Concern for bilateral airspace disease, right side greater the left. Findings could represent pneumonia. Recommend follow up to ensure resolution.    Medical Consultants:   None  Other Consultants:   Physical therapy  IAnti-Infectives:    Levaquin 11/30 -->   Flagyl 11/30 --> 03/28/2014  Vanco PO 03/28/2014 -->    Discharge Exam: Filed Vitals:   03/31/14 0551  BP: 155/65  Pulse: 55  Temp: 97.7 F (36.5 C)  Resp: 20   Filed Vitals:    03/30/14 2025 03/30/14 2100 03/31/14 0551 03/31/14 0813  BP:  133/71 155/65   Pulse:  88 55   Temp:  98.6 F (37 C) 97.7 F (36.5 C)   TempSrc:  Oral Oral   Resp:  20 20   Height:      Weight:      SpO2: 92% 94% 96% 97%    General: Pt is alert, follows commands appropriately, not in acute distress Cardiovascular: Regular rate and rhythm, no rubs, no gallops Respiratory: Clear to auscultation bilaterally, no wheezing, no crackles, no rhonchi Abdominal: Soft, non tender, non distended, bowel sounds +, no guarding  Discharge Instructions  Discharge Instructions    Call MD for:  difficulty breathing, headache or visual disturbances    Complete by:  As directed      Call MD for:  persistant nausea and vomiting    Complete by:  As directed      Call MD for:  redness, tenderness, or signs of infection (pain, swelling, redness, odor or green/yellow discharge around incision site)    Complete by:  As directed      Call MD for:  severe uncontrolled pain    Complete by:  As directed      Diet - low sodium heart healthy    Complete by:  As directed      Diet - low sodium heart healthy    Complete by:  As directed      Discharge instructions    Complete by:  As directed   1. Continue vancomycin by mouth for 2 weeks on discharge for treatment of C. difficile colitis. 2. Continue Levaquin for 4 more doses on discharge for pneumonia. Please note we've repeated chest x-ray 03/27/2014 which showed stable airspace disease on the left but improving on the right. 3. Your white blood cell count on discharge is 18. It is very important to follow with primary care physician in about one week to make sure your symptoms are being controlled. Please have your primary care physician recheck white blood cell count during that visit. 4. Please note that we are holding losartan which you're taking for blood pressure. Your kidney function was elevated during the admission. Usually we checked creatinine which  normal number is around 1.1 or less. Your creatinine was 1.39 but has trended down to 1.34. Since her blood pressure is good with other blood pressure medications including Norvasc and metoprolol it is okay to hold losartan. Have your primary care physician check your kidney function during next visit.     Increase activity slowly    Complete by:  As directed      Increase activity slowly    Complete by:  As directed             Medication List    STOP taking these medications        cefUROXime 500 MG tablet  Commonly known as:  CEFTIN     diphenoxylate-atropine 2.5-0.025 MG per tablet  Commonly known as:  LOMOTIL     losartan 100 MG tablet  Commonly known as:  COZAAR     omeprazole 20 MG capsule  Commonly known as:  PRILOSEC      TAKE these medications        acetaminophen 325 MG tablet  Commonly known as:  TYLENOL  Take 2 tablets (650 mg total) by mouth every 4 (four) hours as needed for fever, headache or mild pain.     amLODipine 5 MG tablet  Commonly known as:  NORVASC  Take 5 mg by mouth daily.     aspirin EC 81 MG tablet  Take 1 tablet (81 mg total) by mouth daily.     atorvastatin 20 MG tablet  Commonly known as:  LIPITOR  TAKE 1 TABLET BY MOUTH EVERY DAY     Calcium-Vitamin D 600-200 MG-UNIT Caps  Take 1 tablet by mouth daily.     donepezil 10 MG tablet  Commonly known as:  ARICEPT  Take 1 tablet (10 mg total) by mouth at bedtime.     mometasone-formoterol 100-5 MCG/ACT Aero  Commonly known as:  DULERA  Inhale 2 puffs into the lungs 2 (two) times daily as needed for wheezing.     DULERA 100-5 MCG/ACT Aero  Generic drug:  mometasone-formoterol  INHALE 2 PUFFS INTO THE LUNGS 2 (TWO) TIMES DAILY. RINSE MOUTH AFTER EACH USE     feeding supplement (ENSURE COMPLETE) Liqd  Take 237 mLs by mouth 2 (two) times daily between meals as needed (Provide if pt with <50% meal completion).     hydrocodone-acetaminophen 5-500 MG per capsule  Commonly known as:   LORCET-HD  Take 1 capsule by mouth every 6 (six) hours as needed for pain.     ibandronate 150 MG tablet  Commonly known as:  BONIVA  Take 150 mg by mouth every 30 (thirty) days. Take in the morning with a full  glass of water, on an empty stomach, and do not take anything else by mouth or lie down for the next 30 min.     levofloxacin 750 MG tablet  Commonly known as:  LEVAQUIN  Take 1 tablet (750 mg total) by mouth every other day. 4 more doses     metoprolol tartrate 25 MG tablet  Commonly known as:  LOPRESSOR  Take 1 tablet (25 mg total) by mouth 2 (two) times daily.     multivitamin with minerals Tabs tablet  Take 1 tablet by mouth daily.     NAMENDA XR 28 MG Cp24  Generic drug:  Memantine HCl ER  Take 1 tablet by mouth daily.     saccharomyces boulardii 250 MG capsule  Commonly known as:  FLORASTOR  Take 1 capsule (250 mg total) by mouth 2 (two) times daily.     sertraline 50 MG tablet  Commonly known as:  ZOLOFT  Take 25 mg by mouth daily.     SYNTHROID 125 MCG tablet  Generic drug:  levothyroxine  Take 125 mcg by mouth Daily.     vancomycin 50 mg/mL oral solution  Commonly known as:  VANCOCIN  Take 2.5 mLs (125 mg total) by mouth 4 (four) times daily. Continue taking for 12 more days, stop date Dec 17th, 2015           Follow-up Information    Follow up with Tommy Medal, MD On 04/02/2014.   Specialty:  Internal Medicine   Why:  Follow up appt after recent hospitalization; at 2:15 pm   Contact information:   [address] [place] [address] [phone]       Follow up with Faye Ramsay, MD.   Specialty:  Internal Medicine   Why:  As needed, If symptoms worsen call my cell phone [phone]   Contact information:   [place] [address] [phone]        The results of significant diagnostics from this hospitalization (including imaging, microbiology, ancillary and laboratory) are  listed below for reference.     Microbiology: Recent Results (from the past 240 hour(s))  Blood Culture (routine x 2)     Status: None (Preliminary result)   Collection Time: 03/25/14  9:07 PM  Result Value Ref Range Status   Specimen Description BLOOD RIGHT WRIST  Final   Special Requests BOTTLES DRAWN AEROBIC AND ANAEROBIC 3CC  Final   Culture  Setup Time   Final    03/26/2014 01:03 Performed at [facility]    Culture   Final           BLOOD CULTURE RECEIVED NO GROWTH TO DATE CULTURE WILL BE HELD FOR 5 DAYS BEFORE ISSUING A FINAL NEGATIVE REPORT Performed at [facility]    Report Status PENDING  Incomplete  Blood Culture (routine x 2)     Status: None (Preliminary result)   Collection Time: 03/25/14  9:58 PM  Result Value Ref Range Status   Specimen Description BLOOD LEFT ANTECUBITAL  Final   Special Requests BOTTLES DRAWN AEROBIC AND ANAEROBIC 5ML  Final   Culture  Setup Time   Final    03/26/2014 01:03 Performed at [facility]    Culture   Final           BLOOD CULTURE RECEIVED NO GROWTH TO DATE CULTURE WILL BE HELD FOR 5 DAYS BEFORE ISSUING A FINAL NEGATIVE REPORT Performed at [facility]  Report Status PENDING  Incomplete  MRSA PCR Screening     Status: None   Collection Time: 03/26/14  2:03 AM  Result Value Ref Range Status   MRSA by PCR NEGATIVE NEGATIVE Final    Comment:        The GeneXpert MRSA Assay (FDA approved for NASAL specimens only), is one component of a comprehensive MRSA colonization surveillance program. It is not intended to diagnose MRSA infection nor to guide or monitor treatment for MRSA infections.   Urine culture     Status: None   Collection Time: 03/26/14  4:15 AM  Result Value Ref Range Status   Specimen Description URINE, CLEAN CATCH  Final   Special Requests NONE  Final   Culture  Setup Time   Final    03/26/2014 08:44 Performed at [place]   Final    40,000  COLONIES/ML Performed at [facility]    Culture   Final    Multiple bacterial morphotypes present, none predominant. Suggest appropriate recollection if clinically indicated. Performed at [facility]    Report Status 03/27/2014 FINAL  Final  Clostridium Difficile by PCR     Status: Abnormal   Collection Time: 03/26/14  8:56 AM  Result Value Ref Range Status   C difficile by pcr POSITIVE (A) NEGATIVE Final    Comment: CRITICAL RESULT CALLED TO, READ BACK BY AND VERIFIED WITH: Lady Deutscher RN 11:55 03/26/14 ([email or web address]) Performed at [place]: Basic Metabolic Panel:  Recent Labs Lab 03/26/14 0345 03/27/14 0340 03/28/14 0453 03/30/14 0432 03/31/14 0504  NA 137 140 144 139 142  K 4.2 4.2 3.7 3.7 3.7  CL 103 107 110 106 107  CO2 20 19 19 22 24   GLUCOSE 119* 140* 135* 88 85  BUN 18 20 22 20 20   CREATININE 1.58* 1.39* 1.34* 1.39* 1.42*  CALCIUM 8.2* 8.5 8.1* 8.8 9.2   Liver Function Tests:  Recent Labs Lab 03/25/14 2033  AST 30  ALT 10  ALKPHOS 82  BILITOT 1.0  PROT 7.7  ALBUMIN 3.3*   CBC:  Recent Labs Lab 03/25/14 2033  03/27/14 0340 03/28/14 0453 03/29/14 0958 03/30/14 1058 03/31/14 0504  WBC 28.5*  < > 17.9* 17.4* 18.1* 16.8* 18.2*  NEUTROABS 22.8*  --   --   --   --   --   --   HGB 12.2  < > 10.3* 9.7* 10.7* 11.0* 11.1*  HCT 37.0  < > 31.8* 29.6* 32.3* 33.3* 33.8*  MCV 90.2  < > 89.3 87.6 88.0 87.2 88.9  PLT 174  < > 128* 235 260 235 189  < > = values in this interval not displayed.  SIGNED: Time coordinating discharge: Over 30 minutes  Faye Ramsay, MD  [facility] 03/31/2014, 8:59 AM Pager [phone]  If 7PM-7AM, please contact night-coverage [email or web address] Password [email or web address]

## 2014-03-31 NOTE — Progress Notes (Signed)
SATURATION QUALIFICATIONS:   Patient Saturations on Room Air at Rest = 96%  Patient Saturations on Room Air while Ambulating = 84%  Patient Saturations on 2 Liters of oxygen while Ambulating = 92%  Please briefly explain why patient needs home oxygen: When patient ambulates her saturations drop into mid 80's. Pt only needs O2 during ambulation per MD

## 2014-03-31 NOTE — Progress Notes (Signed)
ANTIBIOTIC CONSULT NOTE - follow up  Pharmacy Consult for Vancomycin (PO), Levofloxacin Indication: C.diff treatment, sepsis 2/2 CAP  Allergies  Allergen Reactions  . Codeine   . Penicillins Other (See Comments)    Patient and family don't recall specific rxn to PCN    Patient Measurements: Height: 5\' 2"  (157.5 cm) Weight: 139 lb 12.4 oz (63.4 kg) IBW/kg (Calculated) : 50.1  Vital Signs: Temp: 97.7 F (36.5 C) (12/06 0551) Temp Source: Oral (12/06 0551) BP: 155/65 mmHg (12/06 0551) Pulse Rate: 55 (12/06 0551) Intake/Output from previous day: 12/05 0701 - 12/06 0700 In: 960 [P.O.:960] Out: -  Intake/Output from this shift:    Labs:  Recent Labs  03/29/14 0958 03/30/14 0432 03/30/14 1058 03/31/14 0504  WBC 18.1*  --  16.8* 18.2*  HGB 10.7*  --  11.0* 11.1*  PLT 260  --  235 189  CREATININE  --  1.39*  --  1.42*   Estimated Creatinine Clearance: 28.1 mL/min (by C-G formula based on Cr of 1.42). No results for input(s): VANCOTROUGH, VANCOPEAK, VANCORANDOM, GENTTROUGH, GENTPEAK, GENTRANDOM, TOBRATROUGH, TOBRAPEAK, TOBRARND, AMIKACINPEAK, AMIKACINTROU, AMIKACIN in the last 72 hours.   Microbiology: Recent Results (from the past 720 hour(s))  Blood Culture (routine x 2)     Status: None (Preliminary result)   Collection Time: 03/25/14  9:07 PM  Result Value Ref Range Status   Specimen Description BLOOD RIGHT WRIST  Final   Special Requests BOTTLES DRAWN AEROBIC AND ANAEROBIC 3CC  Final   Culture  Setup Time   Final    03/26/2014 01:03 Performed at [facility]    Culture   Final           BLOOD CULTURE RECEIVED NO GROWTH TO DATE CULTURE WILL BE HELD FOR 5 DAYS BEFORE ISSUING A FINAL NEGATIVE REPORT Performed at [facility]    Report Status PENDING  Incomplete  Blood Culture (routine x 2)     Status: None (Preliminary result)   Collection Time: 03/25/14  9:58 PM  Result Value Ref Range Status   Specimen Description BLOOD LEFT ANTECUBITAL   Final   Special Requests BOTTLES DRAWN AEROBIC AND ANAEROBIC 5ML  Final   Culture  Setup Time   Final    03/26/2014 01:03 Performed at [facility]    Culture   Final           BLOOD CULTURE RECEIVED NO GROWTH TO DATE CULTURE WILL BE HELD FOR 5 DAYS BEFORE ISSUING A FINAL NEGATIVE REPORT Performed at [facility]    Report Status PENDING  Incomplete  MRSA PCR Screening     Status: None   Collection Time: 03/26/14  2:03 AM  Result Value Ref Range Status   MRSA by PCR NEGATIVE NEGATIVE Final    Comment:        The GeneXpert MRSA Assay (FDA approved for NASAL specimens only), is one component of a comprehensive MRSA colonization surveillance program. It is not intended to diagnose MRSA infection nor to guide or monitor treatment for MRSA infections.   Urine culture     Status: None   Collection Time: 03/26/14  4:15 AM  Result Value Ref Range Status   Specimen Description URINE, CLEAN CATCH  Final   Special Requests NONE  Final   Culture  Setup Time   Final    03/26/2014 08:44 Performed at [place]   Final    40,000 COLONIES/ML Performed at [facility]    Culture   Final    Multiple bacterial morphotypes present, none predominant. Suggest appropriate recollection if clinically indicated. Performed at [facility]    Report Status 03/27/2014 FINAL  Final  Clostridium Difficile by PCR     Status: Abnormal   Collection Time: 03/26/14  8:56 AM  Result Value Ref Range Status   C difficile by pcr POSITIVE (A) NEGATIVE Final    Comment: CRITICAL RESULT CALLED TO, READ BACK BY AND VERIFIED WITH: Lady Deutscher RN 11:55 03/26/14 ([email or web address]) Performed at [place] History: Past Medical History  Diagnosis Date  . Emphysema     followed by Dr. Joya Gaskins  . S/P thoracic aortic aneurysm repair 1/10    also with bioprosthetic aortic valve prelacement [facility]). in 10/10 she has a descending thoraic  aorta anuerysm and abdominal reapir [facility]). Vascular urgeon is Dr. Lunette Stands.   . Pulmonary embolism 2010    She is no onger taking coumadin  . Hypothyroidism   . Blind right eye   . Hyperlipidemia   . S/P aortic valve replacement with bioprosthetic valve     ehco (8/11) with EF 55-60%, mild-mod MR, mild-mod TR, poorly visualized bioprosthetic aortic valve but no significant regurgitation and gradient not significanly elevated.    . CKD (chronic kidney disease)   . HTN (hypertension)   . History of Doppler echocardiogram     carotid dopplers. (8/11) no significant stenosis  . COPD (chronic obstructive pulmonary disease)   . SVT (supraventricular tachycardia)     HOLTER MONITOR AFTER SURGERY   . GERD (gastroesophageal reflux disease)   . Arthritis   . Cataract     RT  . Hx of echocardiogram 2015    Echo (08/2013):  EF 60-65%, no RWMA, Gr 1 DD, AVR ok (mean 5 mmHg), mild MR, PASP 22 mmHg    Assessment: 67 yoF admitted 11/30 with fever and diarrhea. Pt states she has hx of diverticulitis and had been septic about 1 year prior. Pharmacy is consulted to dose levofloxacin for sepsis 2/2 CAP.  Patient is currently on Flagyl for C.diff infection, and pharmacy now consulted to transition to PO Vancomycin dosing for C.diff.  11/30 >> Metronidazole >> 12/3 11/30 >> Levofloxacin >>  12/2 >> Vancomycin PO >>  Goal of Therapy:  Appropriate antibiotic dosing for renal function and indication Eradication of infection  Plan:   Continue Levofloxacin 750 mg q48h for CAP.  Continue Vancomycin 125 mg PO QID, day 4/14 for C.diff treatment, as considered severe episode d/t WBC > 15K  Continue to follow renal function   Dolly Rias RPh 03/31/2014, 12:03 PM Pager [phone]

## 2014-03-31 NOTE — Progress Notes (Signed)
CARE MANAGEMENT NOTE 03/31/2014  Patient:  Sarah Johnson, Sarah Johnson   Account Number:  [id number]  Date Initiated:  03/26/2014  Documentation initiated by:  DAVIS,RHONDA  Subjective/Objective Assessment:   80 y.o. female with a PMH of emphysema, PE (no longer on Coumadin), COPD, status post porcine AVR, hypertension, hyperlipidemia, hypothyroidism, chronic kidney disease who was brought to the ER by her daughters for evaluation of fever and l     Action/Plan:   home when stable   Anticipated DC Date:  03/31/2014   Anticipated DC Plan:  HOME/SELF CARE  In-house referral  NA      DC Planning Services  CM consult      PAC Choice  NA   Choice offered to / List presented to:  NA   DME arranged  OXYGEN      DME agency  [place] arranged  NA      [place] agency  NA   Status of service:  Completed, signed off Medicare Important Message given?  YES (If response is "NO", the following Medicare IM given date fields will be blank) Date Medicare IM given:  03/28/2014 Medicare IM given by:  [facility] Date Additional Medicare IM given:  03/31/2014 Additional Medicare IM given by:  [name] HARRIS  Discharge Disposition:  HOME/SELF CARE  Per UR Regulation:  Reviewed for med. necessity/level of care/duration of stay  If discussed at [place] of Stay Meetings, dates discussed:    Comments:  03/31/2014 - NCM spoke with patient who agreed to home oxygen. Notified [facility]. Training and development officer RN CCM  03/28/14 KATHY MAHABIR RN,BSN NCM [phone] TRANSFER FROM SDU.MONITOR PROGRESS & D/C NEEDS.PT-NO F/U.  03/26/2014/Rhonda L. Rosana Hoes, RN, BSN, CCM: Chart review for medical necessity and patient discharge needs. Case Manager will follow for patient condition changes. 03/08/2014/Rhonda L. Rosana Hoes, RN, BSN, CCM: CHART NOTE FOR PROGRESSION: Sepsis from community-acquired pneumonia, rule out Clostridium difficile infection

## 2014-04-01 ENCOUNTER — Telehealth: Payer: Self-pay | Admitting: Critical Care Medicine

## 2014-04-01 LAB — CULTURE, BLOOD (ROUTINE X 2)
CULTURE: NO GROWTH
Culture: NO GROWTH

## 2014-04-01 NOTE — Telephone Encounter (Signed)
Called and spoke to pt's daughter, Alyse Low. Christy requesting appt for pt for f/u after hospital. Appt made for 05/01/14 with PW. Christy verbalized understanding and denied any further questions or concerns at this time.

## 2014-04-02 DIAGNOSIS — N390 Urinary tract infection, site not specified: Secondary | ICD-10-CM | POA: Diagnosis not present

## 2014-04-02 DIAGNOSIS — B9689 Other specified bacterial agents as the cause of diseases classified elsewhere: Secondary | ICD-10-CM | POA: Diagnosis not present

## 2014-04-02 DIAGNOSIS — J189 Pneumonia, unspecified organism: Secondary | ICD-10-CM | POA: Diagnosis not present

## 2014-04-10 DIAGNOSIS — N390 Urinary tract infection, site not specified: Secondary | ICD-10-CM | POA: Diagnosis not present

## 2014-04-10 DIAGNOSIS — B9689 Other specified bacterial agents as the cause of diseases classified elsewhere: Secondary | ICD-10-CM | POA: Diagnosis not present

## 2014-04-11 DIAGNOSIS — B9689 Other specified bacterial agents as the cause of diseases classified elsewhere: Secondary | ICD-10-CM | POA: Diagnosis not present

## 2014-04-11 DIAGNOSIS — N390 Urinary tract infection, site not specified: Secondary | ICD-10-CM | POA: Diagnosis not present

## 2014-04-16 DIAGNOSIS — R197 Diarrhea, unspecified: Secondary | ICD-10-CM | POA: Diagnosis not present

## 2014-04-17 DIAGNOSIS — R197 Diarrhea, unspecified: Secondary | ICD-10-CM | POA: Diagnosis not present

## 2014-04-17 DIAGNOSIS — J441 Chronic obstructive pulmonary disease with (acute) exacerbation: Secondary | ICD-10-CM | POA: Diagnosis not present

## 2014-04-17 DIAGNOSIS — N390 Urinary tract infection, site not specified: Secondary | ICD-10-CM | POA: Diagnosis not present

## 2014-04-23 DIAGNOSIS — A047 Enterocolitis due to Clostridium difficile: Secondary | ICD-10-CM | POA: Diagnosis not present

## 2014-04-24 ENCOUNTER — Other Ambulatory Visit: Payer: Self-pay | Admitting: Internal Medicine

## 2014-04-25 DIAGNOSIS — F419 Anxiety disorder, unspecified: Secondary | ICD-10-CM | POA: Diagnosis not present

## 2014-04-25 DIAGNOSIS — R197 Diarrhea, unspecified: Secondary | ICD-10-CM | POA: Diagnosis not present

## 2014-04-25 DIAGNOSIS — N390 Urinary tract infection, site not specified: Secondary | ICD-10-CM | POA: Diagnosis not present

## 2014-05-01 ENCOUNTER — Inpatient Hospital Stay: Payer: Medicare Other | Admitting: Critical Care Medicine

## 2014-05-08 ENCOUNTER — Ambulatory Visit (INDEPENDENT_AMBULATORY_CARE_PROVIDER_SITE_OTHER): Payer: Medicare Other | Admitting: Critical Care Medicine

## 2014-05-08 ENCOUNTER — Encounter: Payer: Self-pay | Admitting: Critical Care Medicine

## 2014-05-08 VITALS — BP 116/80 | HR 58 | Temp 98.2°F | Ht 62.0 in | Wt 131.2 lb

## 2014-05-08 DIAGNOSIS — Z8719 Personal history of other diseases of the digestive system: Secondary | ICD-10-CM

## 2014-05-08 DIAGNOSIS — Z23 Encounter for immunization: Secondary | ICD-10-CM | POA: Diagnosis not present

## 2014-05-08 DIAGNOSIS — J449 Chronic obstructive pulmonary disease, unspecified: Secondary | ICD-10-CM | POA: Diagnosis not present

## 2014-05-08 DIAGNOSIS — Z8619 Personal history of other infectious and parasitic diseases: Secondary | ICD-10-CM

## 2014-05-08 MED ORDER — MOMETASONE FURO-FORMOTEROL FUM 100-5 MCG/ACT IN AERO: INHALATION_SPRAY | RESPIRATORY_TRACT | Status: DC

## 2014-05-08 MED ORDER — PROAIR HFA 108 (90 BASE) MCG/ACT IN AERS: 2.0000 | INHALATION_SPRAY | Freq: Four times a day (QID) | RESPIRATORY_TRACT | Status: AC | PRN

## 2014-05-08 NOTE — Patient Instructions (Addendum)
Stay on Dulera 2 puff twice daily An overnight oxygen test will be obtained Stay on florastor twice daily You do not need to be treated for a UTI unless you have symptoms: fever, abdominal or groin or flank pain, burning on urination.   IF you get an antibiotic for UTI, make sure you INCREASE florastor to three times daily Prevnar 13 vaccine given Return 4 months

## 2014-05-08 NOTE — Progress Notes (Signed)
Subjective:    Patient ID: Sarah Johnson, female    DOB: 11-25-1932, 81 y.o.   MRN: [id number]  HPI This is a 81 y.o.  WF  woman with COPD and primary emphysema.  Pt dx with COPD and primary emphysema 6/09 when being eval for Thoracic aortic aneurysm  05/08/2014 Chief Complaint  Patient presents with  . [facility] Follow up    SOB unchanged.  Has wheezing.  No cough, chest tightness/CP, or f/c/s.    Pt in hosp 11/30>>CAP and C diff colits.    Pt had issues with diarrhea prior to the admission d/t  UTIs and then got ABX then got diarrhea.   Hx of diarrhea in the past. Now diarrhea gone, GI consult Outlaw feels is cleared with c diff.  Still on florastor.  Pt with repeated UTI.  No cough ,  Dyspnea is stable.  D/c on oxygen from hosp stay. Pt denies any significant sore throat, nasal congestion or excess secretions, fever, chills, sweats, unintended weight loss, pleurtic or exertional chest pain, orthopnea PND, or leg swelling Pt denies any increase in rescue therapy over baseline, denies waking up needing it or having any early am or nocturnal exacerbations of coughing/wheezing/or dyspnea. Pt also denies any obvious fluctuation in symptoms with  weather or environmental change or other alleviating or aggravating factors   Review of Systems  Constitutional:   No  weight loss, night sweats,  + Fevers, chills, fatigue, lassitude. HEENT:   No headaches,  Difficulty swallowing,  Tooth/dental problems,  Sore throat,                No sneezing, itching, ear ache, nasal congestion, post nasal drip,   CV:  No chest pain,  Orthopnea, PND, swelling in lower extremities, anasarca, dizziness, palpitations  GI  No heartburn, indigestion, abdominal pain, nausea, vomiting, diarrhea, change in bowel habits, loss of appetite  Resp:  No coughing up of blood.  Marland Kitchen  No chest wall deformity  Skin: no rash or lesions.  GU: no dysuria, change in color of urine, no urgency or frequency.  No flank  pain.  MS:  No joint pain or swelling.    Psych:  No change in mood or affect. No depression or anxiety.  No memory loss.      Objective:   Physical Exam Filed Vitals:   05/08/14 1050  BP: 116/80  Pulse: 58  Temp: 98.2 F (36.8 C)  TempSrc: Oral  Height: 5\' 2"  (1.575 m)  Weight: 131 lb 3.2 oz (59.512 kg)  SpO2: 93%    Gen: Pleasant, well-nourished, in no distress,  normal affect  ENT: No lesions,  mouth clear,  oropharynx clear, no postnasal drip  Neck: No JVD, no TMG, no carotid bruits  Lungs: Coarse BS w/ no wheezing or rales.  no dullness to percussion, distant BS  Cardiovascular: RRR, heart sounds normal, no murmur or gallops, no peripheral edema  Abdomen: soft and NT, no HSM,  BS normal  Musculoskeletal: No deformities, no cyanosis or clubbing  No edema, neg homans sign,   Neuro: alert, non focal  Skin: Warm, no lesions or rashes        Assessment & Plan:   History of Clostridium difficile colitis Hx of C diff colitis d/t recurrent ABX use for UTIs Pt advised to not get ABX for UTI unless symptomatic Stay on florastor    COPD with chronic bronchitis Copd with chronic bronchitis now improved Plan Stay on Dulera 2 puff  twice daily An overnight oxygen test will be obtained Prevnar 13 vaccine given Return 4 months      Updated Medication List Outpatient Encounter Prescriptions as of 05/08/2014  Medication Sig  . acetaminophen (TYLENOL) 325 MG tablet Take 2 tablets (650 mg total) by mouth every 4 (four) hours as needed for fever, headache or mild pain.  Marland Kitchen amLODipine (NORVASC) 5 MG tablet Take 5 mg by mouth daily.    Marland Kitchen aspirin EC 81 MG tablet Take 1 tablet (81 mg total) by mouth daily.  Marland Kitchen atorvastatin (LIPITOR) 20 MG tablet TAKE 1 TABLET BY MOUTH EVERY DAY  . Calcium Carbonate-Vitamin D (CALCIUM-VITAMIN D) 600-200 MG-UNIT CAPS Take 1 tablet by mouth daily.  Marland Kitchen donepezil (ARICEPT) 10 MG tablet Take 1 tablet (10 mg total) by mouth at bedtime.  .  ibandronate (BONIVA) 150 MG tablet Take 150 mg by mouth every 30 (thirty) days. Take in the morning with a full glass of water, on an empty stomach, and do not take anything else by mouth or lie down for the next 30 min.  . metoprolol (LOPRESSOR) 25 MG tablet Take 1 tablet (25 mg total) by mouth 2 (two) times daily.  . mometasone-formoterol (DULERA) 100-5 MCG/ACT AERO INHALE 2 PUFFS INTO THE LUNGS 2 (TWO) TIMES DAILY. RINSE MOUTH AFTER EACH USE  . Multiple Vitamin (MULITIVITAMIN WITH MINERALS) TABS Take 1 tablet by mouth daily.  Marland Kitchen NAMENDA XR 28 MG CP24 Take 1 tablet by mouth daily.  Marland Kitchen PROAIR HFA 108 (90 BASE) MCG/ACT inhaler Inhale 2 puffs into the lungs every 6 (six) hours as needed.  . saccharomyces boulardii (FLORASTOR) 250 MG capsule Take 1 capsule (250 mg total) by mouth 2 (two) times daily.  . sertraline (ZOLOFT) 50 MG tablet Take 25 mg by mouth daily.   Marland Kitchen SYNTHROID 125 MCG tablet Take 125 mcg by mouth Daily.   . [DISCONTINUED] DULERA 100-5 MCG/ACT AERO INHALE 2 PUFFS INTO THE LUNGS 2 (TWO) TIMES DAILY. RINSE MOUTH AFTER EACH USE  . [DISCONTINUED] mometasone-formoterol (DULERA) 100-5 MCG/ACT AERO Inhale 2 puffs into the lungs 2 (two) times daily as needed for wheezing.  . [DISCONTINUED] PROAIR HFA 108 (90 BASE) MCG/ACT inhaler Inhale 2 puffs into the lungs every 6 (six) hours as needed.  . [DISCONTINUED] feeding supplement, ENSURE COMPLETE, (ENSURE COMPLETE) LIQD Take 237 mLs by mouth 2 (two) times daily between meals as needed (Provide if pt with <50% meal completion). (Patient not taking: Reported on 05/08/2014)  . [DISCONTINUED] hydrocodone-acetaminophen (LORCET-HD) 5-500 MG per capsule Take 1 capsule by mouth every 6 (six) hours as needed for pain. (Patient not taking: Reported on 05/08/2014)  . [DISCONTINUED] levofloxacin (LEVAQUIN) 750 MG tablet Take 1 tablet (750 mg total) by mouth every other day. (Patient not taking: Reported on 05/08/2014)  . [DISCONTINUED] vancomycin (VANCOCIN) 50 mg/mL  oral solution Take 2.5 mLs (125 mg total) by mouth 4 (four) times daily. Continue taking for 12 more days, stop date Dec 17th, 2015 (Patient not taking: Reported on 05/08/2014)

## 2014-05-09 DIAGNOSIS — Z8619 Personal history of other infectious and parasitic diseases: Secondary | ICD-10-CM | POA: Insufficient documentation

## 2014-05-09 NOTE — Assessment & Plan Note (Signed)
Hx of C diff colitis d/t recurrent ABX use for UTIs Pt advised to not get ABX for UTI unless symptomatic Stay on florastor

## 2014-05-09 NOTE — Assessment & Plan Note (Signed)
Copd with chronic bronchitis now improved Plan Stay on Dulera 2 puff twice daily An overnight oxygen test will be obtained Prevnar 13 vaccine given Return 4 months

## 2014-05-13 DIAGNOSIS — E780 Pure hypercholesterolemia: Secondary | ICD-10-CM | POA: Diagnosis not present

## 2014-05-13 DIAGNOSIS — I10 Essential (primary) hypertension: Secondary | ICD-10-CM | POA: Diagnosis not present

## 2014-05-13 DIAGNOSIS — E039 Hypothyroidism, unspecified: Secondary | ICD-10-CM | POA: Diagnosis not present

## 2014-05-16 ENCOUNTER — Telehealth: Payer: Self-pay | Admitting: Critical Care Medicine

## 2014-05-16 DIAGNOSIS — J438 Other emphysema: Secondary | ICD-10-CM

## 2014-05-16 NOTE — Telephone Encounter (Signed)
I called spoke with pt. She is wanting her O2 picked up. She reports she does not use this anymore. She reports she had ONO done 1 week ago. Please advise thanks

## 2014-05-16 NOTE — Telephone Encounter (Signed)
Need results of ONO on RA

## 2014-05-20 NOTE — Telephone Encounter (Signed)
Spoke with Melissa with [facility].  She will fax ONO results to triage.  Will wait fax.

## 2014-05-22 NOTE — Telephone Encounter (Signed)
Attempted to call pt. Line was busy. Will try back later. 

## 2014-05-22 NOTE — Telephone Encounter (Signed)
Spoke with pt- aware of rec's per PW Order sent to [facility] to D/C O2. Nothing further needed.

## 2014-05-22 NOTE — Telephone Encounter (Signed)
Let pt know ONO on RA was NORMAL  Ok to Discontinue oxygen

## 2014-05-22 NOTE — Telephone Encounter (Signed)
ONO on RA results received and placed in PW's folder to address.

## 2014-06-28 ENCOUNTER — Inpatient Hospital Stay (HOSPITAL_COMMUNITY)
Admission: EM | Admit: 2014-06-28 | Discharge: 2014-07-05 | DRG: 392 | Disposition: A | Discharge status: Home / Self Care | Payer: Medicare Other | Attending: Internal Medicine | Admitting: Internal Medicine

## 2014-06-28 ENCOUNTER — Emergency Department (HOSPITAL_COMMUNITY): Payer: Medicare Other

## 2014-06-28 ENCOUNTER — Encounter (HOSPITAL_COMMUNITY): Payer: Self-pay | Admitting: *Deleted

## 2014-06-28 DIAGNOSIS — N390 Urinary tract infection, site not specified: Secondary | ICD-10-CM | POA: Diagnosis not present

## 2014-06-28 DIAGNOSIS — R509 Fever, unspecified: Secondary | ICD-10-CM | POA: Diagnosis not present

## 2014-06-28 DIAGNOSIS — N183 Chronic kidney disease, stage 3 (moderate): Secondary | ICD-10-CM | POA: Diagnosis present

## 2014-06-28 DIAGNOSIS — J449 Chronic obstructive pulmonary disease, unspecified: Secondary | ICD-10-CM | POA: Diagnosis not present

## 2014-06-28 DIAGNOSIS — Z886 Allergy status to analgesic agent status: Secondary | ICD-10-CM

## 2014-06-28 DIAGNOSIS — Z8619 Personal history of other infectious and parasitic diseases: Secondary | ICD-10-CM

## 2014-06-28 DIAGNOSIS — N179 Acute kidney failure, unspecified: Secondary | ICD-10-CM | POA: Diagnosis not present

## 2014-06-28 DIAGNOSIS — A047 Enterocolitis due to Clostridium difficile: Secondary | ICD-10-CM | POA: Diagnosis present

## 2014-06-28 DIAGNOSIS — R1011 Right upper quadrant pain: Secondary | ICD-10-CM | POA: Diagnosis not present

## 2014-06-28 DIAGNOSIS — Z952 Presence of prosthetic heart valve: Secondary | ICD-10-CM

## 2014-06-28 DIAGNOSIS — K5720 Diverticulitis of large intestine with perforation and abscess without bleeding: Principal | ICD-10-CM | POA: Diagnosis present

## 2014-06-28 DIAGNOSIS — K219 Gastro-esophageal reflux disease without esophagitis: Secondary | ICD-10-CM | POA: Diagnosis present

## 2014-06-28 DIAGNOSIS — K8020 Calculus of gallbladder without cholecystitis without obstruction: Secondary | ICD-10-CM | POA: Diagnosis present

## 2014-06-28 DIAGNOSIS — E876 Hypokalemia: Secondary | ICD-10-CM | POA: Diagnosis not present

## 2014-06-28 DIAGNOSIS — Z86711 Personal history of pulmonary embolism: Secondary | ICD-10-CM

## 2014-06-28 DIAGNOSIS — R109 Unspecified abdominal pain: Secondary | ICD-10-CM

## 2014-06-28 DIAGNOSIS — I714 Abdominal aortic aneurysm, without rupture: Secondary | ICD-10-CM | POA: Diagnosis not present

## 2014-06-28 DIAGNOSIS — I129 Hypertensive chronic kidney disease with stage 1 through stage 4 chronic kidney disease, or unspecified chronic kidney disease: Secondary | ICD-10-CM | POA: Diagnosis present

## 2014-06-28 DIAGNOSIS — H5441 Blindness, right eye, normal vision left eye: Secondary | ICD-10-CM | POA: Diagnosis present

## 2014-06-28 DIAGNOSIS — K4090 Unilateral inguinal hernia, without obstruction or gangrene, not specified as recurrent: Secondary | ICD-10-CM | POA: Diagnosis present

## 2014-06-28 DIAGNOSIS — A419 Sepsis, unspecified organism: Secondary | ICD-10-CM | POA: Diagnosis present

## 2014-06-28 DIAGNOSIS — Z953 Presence of xenogenic heart valve: Secondary | ICD-10-CM

## 2014-06-28 DIAGNOSIS — I10 Essential (primary) hypertension: Secondary | ICD-10-CM | POA: Diagnosis present

## 2014-06-28 DIAGNOSIS — I712 Thoracic aortic aneurysm, without rupture: Secondary | ICD-10-CM | POA: Diagnosis not present

## 2014-06-28 DIAGNOSIS — A0472 Enterocolitis due to Clostridium difficile, not specified as recurrent: Secondary | ICD-10-CM | POA: Insufficient documentation

## 2014-06-28 DIAGNOSIS — J4489 Other specified chronic obstructive pulmonary disease: Secondary | ICD-10-CM | POA: Diagnosis present

## 2014-06-28 DIAGNOSIS — Z880 Allergy status to penicillin: Secondary | ICD-10-CM

## 2014-06-28 DIAGNOSIS — E785 Hyperlipidemia, unspecified: Secondary | ICD-10-CM | POA: Diagnosis present

## 2014-06-28 DIAGNOSIS — E039 Hypothyroidism, unspecified: Secondary | ICD-10-CM | POA: Diagnosis present

## 2014-06-28 DIAGNOSIS — K5732 Diverticulitis of large intestine without perforation or abscess without bleeding: Secondary | ICD-10-CM | POA: Diagnosis not present

## 2014-06-28 DIAGNOSIS — Z7982 Long term (current) use of aspirin: Secondary | ICD-10-CM

## 2014-06-28 DIAGNOSIS — M1990 Unspecified osteoarthritis, unspecified site: Secondary | ICD-10-CM | POA: Diagnosis present

## 2014-06-28 DIAGNOSIS — Z87891 Personal history of nicotine dependence: Secondary | ICD-10-CM

## 2014-06-28 LAB — LIPASE, BLOOD
LIPASE: 31 U/L (ref 11–59)
Lipase: 27 U/L (ref 11–59)

## 2014-06-28 LAB — URINALYSIS, ROUTINE W REFLEX MICROSCOPIC
Bilirubin Urine: NEGATIVE
Glucose, UA: NEGATIVE mg/dL
KETONES UR: NEGATIVE mg/dL
NITRITE: NEGATIVE
Protein, ur: NEGATIVE mg/dL
SPECIFIC GRAVITY, URINE: 1.019 (ref 1.005–1.030)
Urobilinogen, UA: 0.2 mg/dL (ref 0.0–1.0)
pH: 5.5 (ref 5.0–8.0)

## 2014-06-28 LAB — HEPATIC FUNCTION PANEL
ALK PHOS: 58 U/L (ref 39–117)
ALT: 13 U/L (ref 0–35)
AST: 23 U/L (ref 0–37)
Albumin: 3.3 g/dL — ABNORMAL LOW (ref 3.5–5.2)
BILIRUBIN DIRECT: 0.1 mg/dL (ref 0.0–0.5)
BILIRUBIN INDIRECT: 0.3 mg/dL (ref 0.3–0.9)
Total Bilirubin: 0.4 mg/dL (ref 0.3–1.2)
Total Protein: 6.6 g/dL (ref 6.0–8.3)

## 2014-06-28 LAB — URINE MICROSCOPIC-ADD ON

## 2014-06-28 LAB — CBC
HCT: 37.2 % (ref 36.0–46.0)
HEMOGLOBIN: 11.5 g/dL — AB (ref 12.0–15.0)
MCH: 28 pg (ref 26.0–34.0)
MCHC: 30.9 g/dL (ref 30.0–36.0)
MCV: 90.5 fL (ref 78.0–100.0)
PLATELETS: 179 10*3/uL (ref 150–400)
RBC: 4.11 MIL/uL (ref 3.87–5.11)
RDW: 16 % — AB (ref 11.5–15.5)
WBC: 19.7 10*3/uL — ABNORMAL HIGH (ref 4.0–10.5)

## 2014-06-28 LAB — BASIC METABOLIC PANEL
Anion gap: 6 (ref 5–15)
BUN: 19 mg/dL (ref 6–23)
CHLORIDE: 107 mmol/L (ref 96–112)
CO2: 24 mmol/L (ref 19–32)
CREATININE: 1.51 mg/dL — AB (ref 0.50–1.10)
Calcium: 8.8 mg/dL (ref 8.4–10.5)
GFR calc Af Amer: 37 mL/min — ABNORMAL LOW (ref >90)
GFR calc non Af Amer: 32 mL/min — ABNORMAL LOW (ref >90)
GLUCOSE: 108 mg/dL — AB (ref 70–99)
POTASSIUM: 3.6 mmol/L (ref 3.5–5.1)
Sodium: 137 mmol/L (ref 135–145)

## 2014-06-28 LAB — I-STAT CG4 LACTIC ACID, ED: Lactic Acid, Venous: 1.07 mmol/L (ref 0.5–2.0)

## 2014-06-28 MED ORDER — FENTANYL CITRATE 0.05 MG/ML IJ SOLN
25.0000 ug | Freq: Once | INTRAMUSCULAR | Status: AC
  Administered 2014-06-28: 25 ug via INTRAVENOUS
  Filled 2014-06-28: qty 2

## 2014-06-28 MED ORDER — ONDANSETRON HCL 4 MG/2ML IJ SOLN
4.0000 mg | Freq: Once | INTRAMUSCULAR | Status: AC
  Administered 2014-06-28: 4 mg via INTRAVENOUS
  Filled 2014-06-28: qty 2

## 2014-06-28 MED ORDER — ACETAMINOPHEN 325 MG PO TABS
650.0000 mg | ORAL_TABLET | Freq: Four times a day (QID) | ORAL | Status: DC | PRN
  Administered 2014-06-28: 650 mg via ORAL
  Filled 2014-06-28 (×2): qty 2

## 2014-06-28 NOTE — ED Provider Notes (Signed)
CSN: [id number]     Arrival date & time 06/28/14  1749 History   First MD Initiated Contact with Patient 06/28/14 1941     Chief Complaint  Patient presents with  . Fever  . Diarrhea     (Consider location/radiation/quality/duration/timing/severity/associated sxs/prior Treatment) Patient is a 81 y.o. female presenting with abdominal pain. The history is provided by the patient. No language interpreter was used.  Abdominal Pain Pain location:  RUQ and suprapubic Pain quality: aching   Pain radiates to:  Does not radiate Pain severity:  Moderate Onset quality:  Sudden Duration:  1 day Timing:  Intermittent Progression:  Waxing and waning (worse with standing and walking) Chronicity:  New Context: not awakening from sleep, not diet changes, not eating, not medication withdrawal, not recent illness, not recent sexual activity and not sick contacts   Relieved by:  Nothing Exacerbated by: walking. Ineffective treatments:  None tried Associated symptoms: chills, diarrhea, fever and nausea   Associated symptoms: no anorexia, no chest pain, no cough, no dysuria, no fatigue, no hematuria, no shortness of breath, no sore throat and no vomiting     Past Medical History  Diagnosis Date  . Emphysema     followed by Dr. Joya Gaskins  . S/P thoracic aortic aneurysm repair 1/10    also with bioprosthetic aortic valve prelacement [facility]). in 10/10 she has a descending thoraic aorta anuerysm and abdominal reapir [facility]). Vascular urgeon is Dr. Lunette Stands.   . Pulmonary embolism 2010    She is no onger taking coumadin  . Hypothyroidism   . Blind right eye   . Hyperlipidemia   . S/P aortic valve replacement with bioprosthetic valve     ehco (8/11) with EF 55-60%, mild-mod MR, mild-mod TR, poorly visualized bioprosthetic aortic valve but no significant regurgitation and gradient not significanly elevated.    . CKD (chronic kidney disease)   . HTN (hypertension)   . History of Doppler  echocardiogram     carotid dopplers. (8/11) no significant stenosis  . COPD (chronic obstructive pulmonary disease)   . SVT (supraventricular tachycardia)     HOLTER MONITOR AFTER SURGERY   . GERD (gastroesophageal reflux disease)   . Arthritis   . Cataract     RT  . Hx of echocardiogram 2015    Echo (08/2013):  EF 60-65%, no RWMA, Gr 1 DD, AVR ok (mean 5 mmHg), mild MR, PASP 22 mmHg   Past Surgical History  Procedure Laterality Date  . Thoracoabdominal aortic aneurysm repair  01/28/09    repair  . Anomalous pulmonary venous return repair    . Thoracic aortic aneurysm repair  1/10    VALVE ALSO DONE  . Back surgery  1970   . Eye surgery      AS BABY- RT EYE  POOR SIGHT  . Tonsillectomy    . Lumbar laminectomy/decompression microdiscectomy  05/31/2011    Procedure: LUMBAR LAMINECTOMY/DECOMPRESSION MICRODISCECTOMY;  Surgeon: Elaina Hoops, MD;  Location: [place] NEURO ORS;  Service: Neurosurgery;  Laterality: Bilateral;  Bilateral Lumbar three-four decompressionj lumbar laminectomy    Family History  Problem Relation Age of Onset  . Brain cancer Maternal Grandmother   . Coronary artery disease      no premature CAD   History  Substance Use Topics  . Smoking status: Former Smoker -- 1.00 packs/day for 20 years    Types: Cigarettes    Quit date: 04/26/2004  . Smokeless tobacco: Never Used     Comment: quit in  2006  . Alcohol Use: No   OB History    No data available     Review of Systems  Constitutional: Positive for fever and chills. Negative for diaphoresis, activity change, appetite change and fatigue.  HENT: Negative for congestion, facial swelling, rhinorrhea and sore throat.   Eyes: Negative for photophobia and discharge.  Respiratory: Negative for cough, chest tightness and shortness of breath.   Cardiovascular: Negative for chest pain, palpitations and leg swelling.  Gastrointestinal: Positive for nausea, abdominal pain and diarrhea. Negative for vomiting and anorexia.   Endocrine: Negative for polydipsia and polyuria.  Genitourinary: Negative for dysuria, frequency, hematuria, difficulty urinating and pelvic pain.  Musculoskeletal: Negative for back pain, arthralgias, neck pain and neck stiffness.  Skin: Negative for color change and wound.  Allergic/Immunologic: Negative for immunocompromised state.  Neurological: Negative for facial asymmetry, weakness, numbness and headaches.  Hematological: Does not bruise/bleed easily.  Psychiatric/Behavioral: Negative for confusion and agitation.      Allergies  Codeine and Penicillins  Home Medications   Prior to Admission medications   Medication Sig Start Date End Date Taking? Authorizing Provider  acetaminophen (TYLENOL) 325 MG tablet Take 2 tablets (650 mg total) by mouth every 4 (four) hours as needed for fever, headache or mild pain. 03/29/14  Yes Robbie Lis, MD  amLODipine (NORVASC) 5 MG tablet Take 5 mg by mouth daily.     Yes Historical Provider, MD  aspirin EC 81 MG tablet Take 1 tablet (81 mg total) by mouth daily. 05/04/11  Yes Larey Dresser, MD  atorvastatin (LIPITOR) 20 MG tablet TAKE 1 TABLET BY MOUTH EVERY DAY   Yes Larey Dresser, MD  Calcium Carbonate-Vitamin D (CALCIUM-VITAMIN D) 600-200 MG-UNIT CAPS Take 1 tablet by mouth daily.   Yes Historical Provider, MD  donepezil (ARICEPT) 10 MG tablet Take 1 tablet (10 mg total) by mouth at bedtime. 08/20/13  Yes Star Age, MD  ergocalciferol (VITAMIN D2) 50000 UNITS capsule Take 50,000 Units by mouth every 30 (thirty) days.   Yes Historical Provider, MD  erythromycin base (E-MYCIN) 500 MG tablet Take 500 mg by mouth 3 (three) times daily.   Yes Historical Provider, MD  ibandronate (BONIVA) 150 MG tablet Take 150 mg by mouth every 30 (thirty) days. Take in the morning with a full glass of water, on an empty stomach, and do not take anything else by mouth or lie down for the next 30 min.   Yes Historical Provider, MD  levothyroxine (SYNTHROID,  LEVOTHROID) 112 MCG tablet Take 112 mcg by mouth daily before breakfast.   Yes Historical Provider, MD  metoprolol (LOPRESSOR) 25 MG tablet Take 1 tablet (25 mg total) by mouth 2 (two) times daily. 03/31/14  Yes Theodis Blaze, MD  mometasone-formoterol (DULERA) 100-5 MCG/ACT AERO INHALE 2 PUFFS INTO THE LUNGS 2 (TWO) TIMES DAILY. RINSE MOUTH AFTER EACH USE 05/08/14  Yes Elsie Stain, MD  Multiple Vitamin (MULITIVITAMIN WITH MINERALS) TABS Take 1 tablet by mouth daily.   Yes Historical Provider, MD  NAMENDA XR 28 MG CP24 Take 1 tablet by mouth daily. 07/17/13  Yes Historical Provider, MD  PRESCRIPTION MEDICATION Take 1 capsule by mouth as directed. Patient has an Antibiotic prescribed by dentist that patient takes 1 hour before dental work due to artificial heart valves. Unsure of name of antibiotic and can not clarify what it is due to it being an old refill patient keeps at home for dental work.   Yes Historical Provider, MD  PROAIR HFA 108 (90 BASE) MCG/ACT inhaler Inhale 2 puffs into the lungs every 6 (six) hours as needed. Patient taking differently: Inhale 2 puffs into the lungs every 6 (six) hours as needed for wheezing or shortness of breath.  05/08/14  Yes Elsie Stain, MD  saccharomyces boulardii (FLORASTOR) 250 MG capsule Take 1 capsule (250 mg total) by mouth 2 (two) times daily. 03/29/14  Yes Robbie Lis, MD  sertraline (ZOLOFT) 50 MG tablet Take 50 mg by mouth daily.  07/19/13  Yes Historical Provider, MD   BP 112/50 mmHg  Pulse 82  Temp(Src) 100.8 F (38.2 C) (Rectal)  Resp 17  Ht 5' (1.524 m)  Wt 131 lb (59.421 kg)  BMI 25.58 kg/m2  SpO2 93% Physical Exam  Constitutional: She is oriented to person, place, and time. She appears well-developed and well-nourished. No distress.  HENT:  Head: Normocephalic and atraumatic.  Mouth/Throat: No oropharyngeal exudate.  Eyes: Pupils are equal, round, and reactive to light.  Neck: Normal range of motion. Neck supple.  Cardiovascular:  Normal rate, regular rhythm and normal heart sounds.  Exam reveals no gallop and no friction rub.   No murmur heard. Pulmonary/Chest: Effort normal and breath sounds normal. No respiratory distress. She has no wheezes. She has no rales.  Abdominal: Soft. Bowel sounds are normal. She exhibits no distension and no mass. There is tenderness in the right upper quadrant and suprapubic area. There is no rigidity, no rebound and no guarding.  Musculoskeletal: Normal range of motion. She exhibits no edema or tenderness.  Neurological: She is alert and oriented to person, place, and time.  Skin: Skin is warm and dry.  Psychiatric: She has a normal mood and affect.    ED Course  Procedures (including critical care time) Labs Review Labs Reviewed  CBC - Abnormal; Notable for the following:    WBC 19.7 (*)    Hemoglobin 11.5 (*)    RDW 16.0 (*)    All other components within normal limits  BASIC METABOLIC PANEL - Abnormal; Notable for the following:    Glucose, Bld 108 (*)    Creatinine, Ser 1.51 (*)    GFR calc non Af Amer 32 (*)    GFR calc Af Amer 37 (*)    All other components within normal limits  URINALYSIS, ROUTINE W REFLEX MICROSCOPIC - Abnormal; Notable for the following:    Hgb urine dipstick TRACE (*)    Leukocytes, UA LARGE (*)    All other components within normal limits  URINE MICROSCOPIC-ADD ON - Abnormal; Notable for the following:    Squamous Epithelial / LPF FEW (*)    All other components within normal limits  HEPATIC FUNCTION PANEL - Abnormal; Notable for the following:    Albumin 3.3 (*)    All other components within normal limits  URINE CULTURE  CLOSTRIDIUM DIFFICILE BY PCR  LIPASE, BLOOD  LIPASE, BLOOD  I-STAT CG4 LACTIC ACID, ED    Imaging Review Ct Abdomen Pelvis Wo Contrast  06/29/2014   CLINICAL DATA:  Acute onset of fever. Not feeling well. Diarrhea. Leukocytosis. Initial encounter.  EXAM: CT ABDOMEN AND PELVIS WITHOUT CONTRAST  TECHNIQUE: Multidetector CT  imaging of the abdomen and pelvis was performed following the standard protocol without IV contrast.  COMPARISON:  CT of the abdomen and pelvis from 03/25/2014  FINDINGS: Minimal left basilar atelectasis is noted.  The liver is unremarkable in appearance. The spleen is absent. Stones are noted dependently within the gallbladder; the  gallbladder is otherwise unremarkable. The pancreas and adrenal glands are unremarkable.  A 3.7 cm cyst is noted at the upper pole of the right kidney. Mild nonspecific perinephric stranding is noted on the left. The kidneys are otherwise unremarkable. No hydronephrosis is seen. No renal or ureteral stones are identified.  There is herniation of a short segment of distal ileum into a small right inguinal hernia, without evidence for obstruction. The visualized small bowel is otherwise unremarkable. The stomach is within normal limits. No acute vascular abnormalities are seen.  There is aneurysmal dilatation of the distal thoracic and proximal abdominal aorta, measuring up to 4.8 cm just above the diaphragm, and 3.7 cm along the proximal abdominal aorta. Aneurysmal dilatation resolves just distal to the origin of the inferior mesenteric artery. Scattered calcification is seen along the abdominal aorta.  The celiac trunk, superior mesenteric artery and bilateral renal arteries are grossly unremarkable.  The appendix is grossly unremarkable in appearance; there is no evidence for appendicitis.  There is acute diverticulitis along the distal sigmoid colon, with diffuse soft tissue inflammation and trace free fluid. Two small foci of air are seen tracking superiorly along the mesentery, concerning for a small perforation.  Scattered diverticulosis is noted along the distal descending and sigmoid colon. Contrast progresses to the level of the rectum. The colon is otherwise unremarkable.  The bladder is mildly distended and grossly unremarkable. The uterus is unremarkable in appearance. The  ovaries are grossly symmetric; no suspicious adnexal masses are seen. No inguinal lymphadenopathy is seen.  No acute osseous abnormalities are identified. Vacuum phenomenon is noted at L3-L4 and L4-L5. There is partial sacralization of vertebral body L5. Underlying facet disease is noted. There is grade 1 anterolisthesis of L4 on L5.  IMPRESSION: 1. Acute diverticulitis along the distal sigmoid colon, with diffuse soft tissue inflammation and trace free fluid. Small perforation noted, with two small foci of air seen tracking superiorly along the mesentery. 2. Scattered diverticulosis along the distal descending and sigmoid colon. 3. Aneurysmal dilatation of the distal thoracic and proximal abdominal aorta is grossly stable, measuring up to 4.8 cm just above the diaphragm, and 3.7 cm along the proximal abdominal aorta. Aneurysmal dilatation resolves just distal to the origin of the inferior mesenteric artery. Scattered calcification along the abdominal aorta. 4. Cholelithiasis; gallbladder otherwise unremarkable in appearance. 5. Right renal cyst noted. 6. Mild degenerative change along the lower lumbar spine.  These results were called by telephone at the time of interpretation on 06/29/2014 at 12:25 am to Dr. Ernestina Patches, who verbally acknowledged these results.   Electronically Signed   By: Garald Balding M.D.   On: 06/29/2014 00:34   Dg Chest 2 View  06/28/2014   CLINICAL DATA:  Fever.  COPD.  EXAM: CHEST  2 VIEW  COMPARISON:  03/27/2014  FINDINGS: Mild cardiomegaly remains stable. No evidence of pulmonary infiltrate or edema. No evidence of pleural effusion. Mild scarring again seen in left lung base. Previous median sternotomy again noted.  IMPRESSION: Stable mild cardiomegaly and left basilar scarring. No active disease.   Electronically Signed   By: Earle Gell M.D.   On: 06/28/2014 20:58   US Abdomen Limited Ruq  06/28/2014   CLINICAL DATA:  Right upper quadrant pain history of gallstones  EXAM: US  ABDOMEN LIMITED - RIGHT UPPER QUADRANT  COMPARISON:  None.  FINDINGS: Gallbladder:  Cholelithiasis. There is wall thickening to 4 mm but this is likely from underdistention of the gallbladder. No focal tenderness.  Common bile duct:  Diameter: 4 mm  Liver:  No focal lesion identified. Within normal limits in parenchymal echogenicity.  Partly visible cyst from the right kidney measuring approximately 3 cm.  IMPRESSION: Cholelithiasis without acute cholecystitis.   Electronically Signed   By: Monte Fantasia M.D.   On: 06/28/2014 23:33     EKG Interpretation None      MDM   Final diagnoses:  Fever  Abdominal pain  RUQ pain  Diverticulitis of colon with perforation  UTI (lower urinary tract infection)  Pt is a 81 y.o. female with Pmhx as above who presents with low abdominal pain since this morning as well as associated fevers, chills and watery diarrhea.  On physical exam, patient is febrile signs otherwise stable.  She has both suprapubic and right upper quadrant tenderness without rebound or guarding.  Lip blood cell count elevated at 19.7.  Urine appears likely infected.  Creatinine is mildly worse than baseline.  LFTs and lipase are normal.  Her upper quadrant ultrasound shows cholelithiasis without cholecystitis.  CT abdomen pelvis with acute diverticulitis and small perforation.  IV Cipro and Flagyl ordered.  [facility] consulted for admission. They have requested [facility] also be involved.       Ernestina Patches, MD 06/29/14 [phone]

## 2014-06-28 NOTE — ED Notes (Signed)
Bed: [facility] Expected date:  Expected time:  Means of arrival:  Comments: EMS-fever

## 2014-06-28 NOTE — ED Notes (Signed)
[name]: [phone] Dun: [phone] daughters are leaving momentarily to get food/will be back

## 2014-06-28 NOTE — ED Notes (Signed)
Pt in from home. C/o fever, 101.4, this am. Generally not feeling well. Diarrhea a few times today. Hx diverticulitis. Also tooth pulled this week, on abx and ibuprofen.

## 2014-06-29 ENCOUNTER — Encounter (HOSPITAL_COMMUNITY): Payer: Self-pay | Admitting: Internal Medicine

## 2014-06-29 DIAGNOSIS — N189 Chronic kidney disease, unspecified: Secondary | ICD-10-CM | POA: Diagnosis not present

## 2014-06-29 DIAGNOSIS — M1990 Unspecified osteoarthritis, unspecified site: Secondary | ICD-10-CM | POA: Diagnosis present

## 2014-06-29 DIAGNOSIS — R1011 Right upper quadrant pain: Secondary | ICD-10-CM | POA: Diagnosis not present

## 2014-06-29 DIAGNOSIS — K5780 Diverticulitis of intestine, part unspecified, with perforation and abscess without bleeding: Secondary | ICD-10-CM | POA: Diagnosis not present

## 2014-06-29 DIAGNOSIS — I10 Essential (primary) hypertension: Secondary | ICD-10-CM | POA: Diagnosis not present

## 2014-06-29 DIAGNOSIS — Z8719 Personal history of other diseases of the digestive system: Secondary | ICD-10-CM | POA: Diagnosis not present

## 2014-06-29 DIAGNOSIS — J449 Chronic obstructive pulmonary disease, unspecified: Secondary | ICD-10-CM | POA: Diagnosis not present

## 2014-06-29 DIAGNOSIS — Z86711 Personal history of pulmonary embolism: Secondary | ICD-10-CM | POA: Diagnosis not present

## 2014-06-29 DIAGNOSIS — K5732 Diverticulitis of large intestine without perforation or abscess without bleeding: Secondary | ICD-10-CM | POA: Diagnosis not present

## 2014-06-29 DIAGNOSIS — K5720 Diverticulitis of large intestine with perforation and abscess without bleeding: Secondary | ICD-10-CM | POA: Diagnosis not present

## 2014-06-29 DIAGNOSIS — A419 Sepsis, unspecified organism: Secondary | ICD-10-CM | POA: Diagnosis not present

## 2014-06-29 DIAGNOSIS — Z7982 Long term (current) use of aspirin: Secondary | ICD-10-CM | POA: Diagnosis not present

## 2014-06-29 DIAGNOSIS — Z954 Presence of other heart-valve replacement: Secondary | ICD-10-CM | POA: Diagnosis not present

## 2014-06-29 DIAGNOSIS — I129 Hypertensive chronic kidney disease with stage 1 through stage 4 chronic kidney disease, or unspecified chronic kidney disease: Secondary | ICD-10-CM | POA: Diagnosis present

## 2014-06-29 DIAGNOSIS — H5441 Blindness, right eye, normal vision left eye: Secondary | ICD-10-CM | POA: Diagnosis present

## 2014-06-29 DIAGNOSIS — K5700 Diverticulitis of small intestine with perforation and abscess without bleeding: Secondary | ICD-10-CM | POA: Diagnosis not present

## 2014-06-29 DIAGNOSIS — N390 Urinary tract infection, site not specified: Secondary | ICD-10-CM

## 2014-06-29 DIAGNOSIS — Z880 Allergy status to penicillin: Secondary | ICD-10-CM | POA: Diagnosis not present

## 2014-06-29 DIAGNOSIS — E876 Hypokalemia: Secondary | ICD-10-CM | POA: Diagnosis not present

## 2014-06-29 DIAGNOSIS — N183 Chronic kidney disease, stage 3 (moderate): Secondary | ICD-10-CM

## 2014-06-29 DIAGNOSIS — Z87891 Personal history of nicotine dependence: Secondary | ICD-10-CM | POA: Diagnosis not present

## 2014-06-29 DIAGNOSIS — A047 Enterocolitis due to Clostridium difficile: Secondary | ICD-10-CM | POA: Diagnosis not present

## 2014-06-29 DIAGNOSIS — N179 Acute kidney failure, unspecified: Secondary | ICD-10-CM | POA: Diagnosis present

## 2014-06-29 DIAGNOSIS — Z886 Allergy status to analgesic agent status: Secondary | ICD-10-CM | POA: Diagnosis not present

## 2014-06-29 DIAGNOSIS — K4090 Unilateral inguinal hernia, without obstruction or gangrene, not specified as recurrent: Secondary | ICD-10-CM | POA: Diagnosis present

## 2014-06-29 DIAGNOSIS — I712 Thoracic aortic aneurysm, without rupture: Secondary | ICD-10-CM | POA: Diagnosis not present

## 2014-06-29 DIAGNOSIS — K219 Gastro-esophageal reflux disease without esophagitis: Secondary | ICD-10-CM | POA: Diagnosis present

## 2014-06-29 DIAGNOSIS — Z952 Presence of prosthetic heart valve: Secondary | ICD-10-CM | POA: Diagnosis not present

## 2014-06-29 DIAGNOSIS — K8020 Calculus of gallbladder without cholecystitis without obstruction: Secondary | ICD-10-CM | POA: Diagnosis present

## 2014-06-29 DIAGNOSIS — Z953 Presence of xenogenic heart valve: Secondary | ICD-10-CM | POA: Diagnosis not present

## 2014-06-29 DIAGNOSIS — I714 Abdominal aortic aneurysm, without rupture: Secondary | ICD-10-CM | POA: Diagnosis not present

## 2014-06-29 DIAGNOSIS — E785 Hyperlipidemia, unspecified: Secondary | ICD-10-CM | POA: Diagnosis present

## 2014-06-29 DIAGNOSIS — E039 Hypothyroidism, unspecified: Secondary | ICD-10-CM | POA: Diagnosis present

## 2014-06-29 LAB — COMPREHENSIVE METABOLIC PANEL
ALBUMIN: 3 g/dL — AB (ref 3.5–5.2)
ALT: 12 U/L (ref 0–35)
AST: 20 U/L (ref 0–37)
Alkaline Phosphatase: 55 U/L (ref 39–117)
Anion gap: 6 (ref 5–15)
BUN: 15 mg/dL (ref 6–23)
CO2: 22 mmol/L (ref 19–32)
Calcium: 8.1 mg/dL — ABNORMAL LOW (ref 8.4–10.5)
Chloride: 107 mmol/L (ref 96–112)
Creatinine, Ser: 1.2 mg/dL — ABNORMAL HIGH (ref 0.50–1.10)
GFR calc Af Amer: 48 mL/min — ABNORMAL LOW (ref >90)
GFR calc non Af Amer: 42 mL/min — ABNORMAL LOW (ref >90)
GLUCOSE: 102 mg/dL — AB (ref 70–99)
Potassium: 3.7 mmol/L (ref 3.5–5.1)
Sodium: 135 mmol/L (ref 135–145)
TOTAL PROTEIN: 6 g/dL (ref 6.0–8.3)
Total Bilirubin: 0.8 mg/dL (ref 0.3–1.2)

## 2014-06-29 LAB — MAGNESIUM: Magnesium: 1.5 mg/dL (ref 1.5–2.5)

## 2014-06-29 LAB — CBC
HCT: 33.9 % — ABNORMAL LOW (ref 36.0–46.0)
Hemoglobin: 10.7 g/dL — ABNORMAL LOW (ref 12.0–15.0)
MCH: 28.5 pg (ref 26.0–34.0)
MCHC: 31.6 g/dL (ref 30.0–36.0)
MCV: 90.4 fL (ref 78.0–100.0)
Platelets: 232 10*3/uL (ref 150–400)
RBC: 3.75 MIL/uL — ABNORMAL LOW (ref 3.87–5.11)
RDW: 16.2 % — ABNORMAL HIGH (ref 11.5–15.5)
WBC: 17.9 10*3/uL — AB (ref 4.0–10.5)

## 2014-06-29 LAB — CLOSTRIDIUM DIFFICILE BY PCR: Toxigenic C. Difficile by PCR: POSITIVE — AB

## 2014-06-29 LAB — TSH: TSH: 1.491 u[IU]/mL (ref 0.350–4.500)

## 2014-06-29 LAB — PHOSPHORUS: Phosphorus: 3 mg/dL (ref 2.3–4.6)

## 2014-06-29 MED ORDER — LEVOTHYROXINE SODIUM 112 MCG PO TABS
112.0000 ug | ORAL_TABLET | Freq: Every day | ORAL | Status: DC
  Administered 2014-06-29 – 2014-07-05 (×7): 112 ug via ORAL
  Filled 2014-06-29 (×9): qty 1

## 2014-06-29 MED ORDER — ENOXAPARIN SODIUM 30 MG/0.3ML ~~LOC~~ SOLN
30.0000 mg | SUBCUTANEOUS | Status: DC
  Administered 2014-06-29 – 2014-07-04 (×6): 30 mg via SUBCUTANEOUS
  Filled 2014-06-29 (×6): qty 0.3

## 2014-06-29 MED ORDER — CIPROFLOXACIN IN D5W 400 MG/200ML IV SOLN
400.0000 mg | Freq: Two times a day (BID) | INTRAVENOUS | Status: DC
  Administered 2014-06-29 – 2014-07-02 (×7): 400 mg via INTRAVENOUS
  Filled 2014-06-29 (×7): qty 200

## 2014-06-29 MED ORDER — SODIUM CHLORIDE 0.9 % IV SOLN
INTRAVENOUS | Status: AC
  Administered 2014-06-29: 03:00:00 via INTRAVENOUS

## 2014-06-29 MED ORDER — SACCHAROMYCES BOULARDII 250 MG PO CAPS
250.0000 mg | ORAL_CAPSULE | Freq: Two times a day (BID) | ORAL | Status: DC
  Administered 2014-06-29 – 2014-07-05 (×13): 250 mg via ORAL
  Filled 2014-06-29 (×14): qty 1

## 2014-06-29 MED ORDER — CIPROFLOXACIN IN D5W 400 MG/200ML IV SOLN
400.0000 mg | Freq: Once | INTRAVENOUS | Status: AC
  Administered 2014-06-29: 400 mg via INTRAVENOUS
  Filled 2014-06-29: qty 200

## 2014-06-29 MED ORDER — DEXTROSE-NACL 5-0.45 % IV SOLN
INTRAVENOUS | Status: DC
  Administered 2014-06-29 – 2014-06-30 (×3): via INTRAVENOUS

## 2014-06-29 MED ORDER — ATORVASTATIN CALCIUM 20 MG PO TABS
20.0000 mg | ORAL_TABLET | Freq: Every day | ORAL | Status: DC
  Administered 2014-06-29 – 2014-07-04 (×6): 20 mg via ORAL
  Filled 2014-06-29 (×7): qty 1

## 2014-06-29 MED ORDER — MOMETASONE FURO-FORMOTEROL FUM 100-5 MCG/ACT IN AERO
2.0000 | INHALATION_SPRAY | Freq: Two times a day (BID) | RESPIRATORY_TRACT | Status: DC
  Administered 2014-06-29 – 2014-07-05 (×13): 2 via RESPIRATORY_TRACT
  Filled 2014-06-29: qty 8.8

## 2014-06-29 MED ORDER — FENTANYL CITRATE 0.05 MG/ML IJ SOLN: 50.0000 ug | INTRAMUSCULAR | Status: DC | PRN

## 2014-06-29 MED ORDER — METRONIDAZOLE IN NACL 5-0.79 MG/ML-% IV SOLN
500.0000 mg | Freq: Three times a day (TID) | INTRAVENOUS | Status: DC
  Administered 2014-06-29 – 2014-07-02 (×9): 500 mg via INTRAVENOUS
  Filled 2014-06-29 (×11): qty 100

## 2014-06-29 MED ORDER — METRONIDAZOLE IN NACL 5-0.79 MG/ML-% IV SOLN
500.0000 mg | Freq: Once | INTRAVENOUS | Status: AC
  Administered 2014-06-29: 500 mg via INTRAVENOUS
  Filled 2014-06-29: qty 100

## 2014-06-29 MED ORDER — ACETAMINOPHEN 650 MG RE SUPP: 650.0000 mg | Freq: Four times a day (QID) | RECTAL | Status: DC | PRN

## 2014-06-29 MED ORDER — ONDANSETRON HCL 4 MG PO TABS: 4.0000 mg | ORAL_TABLET | Freq: Four times a day (QID) | ORAL | Status: DC | PRN

## 2014-06-29 MED ORDER — SERTRALINE HCL 50 MG PO TABS
50.0000 mg | ORAL_TABLET | Freq: Every day | ORAL | Status: DC
  Administered 2014-06-29 – 2014-07-05 (×7): 50 mg via ORAL
  Filled 2014-06-29 (×7): qty 1

## 2014-06-29 MED ORDER — ALBUTEROL SULFATE HFA 108 (90 BASE) MCG/ACT IN AERS: 2.0000 | INHALATION_SPRAY | RESPIRATORY_TRACT | Status: DC | PRN

## 2014-06-29 MED ORDER — ALBUTEROL SULFATE (2.5 MG/3ML) 0.083% IN NEBU: 2.5000 mg | INHALATION_SOLUTION | RESPIRATORY_TRACT | Status: DC | PRN

## 2014-06-29 MED ORDER — ASPIRIN EC 81 MG PO TBEC
81.0000 mg | DELAYED_RELEASE_TABLET | Freq: Every day | ORAL | Status: DC
  Administered 2014-06-29 – 2014-07-05 (×7): 81 mg via ORAL
  Filled 2014-06-29 (×7): qty 1

## 2014-06-29 MED ORDER — METOPROLOL TARTRATE 25 MG PO TABS
25.0000 mg | ORAL_TABLET | Freq: Two times a day (BID) | ORAL | Status: DC
  Administered 2014-06-29 – 2014-07-05 (×12): 25 mg via ORAL
  Filled 2014-06-29 (×15): qty 1

## 2014-06-29 MED ORDER — MEMANTINE HCL ER 28 MG PO CP24
28.0000 mg | ORAL_CAPSULE | Freq: Every day | ORAL | Status: DC
  Administered 2014-06-29 – 2014-07-05 (×7): 28 mg via ORAL
  Filled 2014-06-29 (×7): qty 1

## 2014-06-29 MED ORDER — HYDROCODONE-ACETAMINOPHEN 5-325 MG PO TABS
1.0000 | ORAL_TABLET | ORAL | Status: DC | PRN
Start: 2014-06-29 — End: 2014-07-05

## 2014-06-29 MED ORDER — ONDANSETRON HCL 4 MG/2ML IJ SOLN: 4.0000 mg | Freq: Four times a day (QID) | INTRAMUSCULAR | Status: DC | PRN

## 2014-06-29 MED ORDER — DONEPEZIL HCL 10 MG PO TABS
10.0000 mg | ORAL_TABLET | Freq: Every day | ORAL | Status: DC
  Administered 2014-06-29 – 2014-07-04 (×6): 10 mg via ORAL
  Filled 2014-06-29 (×7): qty 1

## 2014-06-29 MED ORDER — ACETAMINOPHEN 325 MG PO TABS
650.0000 mg | ORAL_TABLET | Freq: Four times a day (QID) | ORAL | Status: DC | PRN
  Administered 2014-06-30 (×2): 650 mg via ORAL
  Filled 2014-06-29 (×2): qty 2

## 2014-06-29 MED ORDER — MORPHINE SULFATE 2 MG/ML IJ SOLN: 2.0000 mg | INTRAMUSCULAR | Status: DC | PRN

## 2014-06-29 NOTE — Progress Notes (Addendum)
CRITICAL VALUE ALERT  Critical value received:  Positive Cdiff  Date of notification:  06/29/2014  Time of notification:  1225  Critical value read back:Yes  Nurse who received alert:  Abigail Butts  MD notified (1st page):  Dr. Olevia Bowens  Time of first page:  1300  MD notified (2nd page):  Time of second page:  Responding MD: Dr Olevia Bowens  Time MD responded:  1350

## 2014-06-29 NOTE — Consult Note (Signed)
Re:   Sarah Johnson DOB:   [birth year] MRN:   [id number]   [facility] consultation  ASSESSMENT AND PLAN: 1.  Diverticulitis with probable focal perforation  Cipro/Flagyl - 3/5 >>>  She is already feeling better.  Appears to be responding to med management.  Will follow  2.  Emphysema/COPD 3.  History of PE 4.  Hypothyroid 5.  HTN 6.  History of thoracic aorta repair        Has 4.8 x 3.7 cm aneurysm now on CT 7.  Aortic valve replacement 8.  Blind right eye 9.  Creatinine elevated - 1.2 - 3/4//2016 10.  Right inguinal hernia with SB - not incarcerated 11.  Gall stones 12.  History of C. Diff   Chief Complaint  Patient presents with  . Fever  . Diarrhea   REFERRING PHYSICIAN: PANG,RICHARD, MD  HISTORY OF PRESENT ILLNESS: Sarah Johnson is a 81 y.o. (DOB: 1933/01/02)  white  female whose primary care physician is PANG,RICHARD, MD. She is admitted for diverticulitis.  She is not the best historian.  She had prior diverticulitis about one year ago.  She was in the hospital from 04/21/2013 - 04/26/2013 for this. She developed acute abdominal pain on Thursday, 06/27/2014.  She was brought to the [facility] ER on 3/4 by ambulance for worsening abdominal pain.  She said it hurt in her abdomen when she walked.  She was hospitalized in 03/15/2014 - 03/31/2014 for sepsis from [place] and C. Diff.  CT abdomen/pelvis - 06/28/2014 - 1. Acute diverticulitis along the distal sigmoid colon, with diffuse soft tissue inflammation and trace free fluid. Small perforation noted, with two small foci of air seen tracking superiorly along the mesentery.  2. Scattered diverticulosis along the distal descending and sigmoid colon.  3. Aneurysmal dilatation of the distal thoracic and proximal abdominal aorta is grossly stable, measuring up to 4.8 cm just above the diaphragm, and 3.7 cm along the proximal abdominal aorta. Aneurysmal dilatation resolves just distal to the origin of the inferior mesenteric  artery.  4. Cholelithiasis; gallbladder otherwise unremarkable in appearance.  5. Right renal cyst noted.  6. Mild degenerative change along the lower lumbar spine. WBC - 17,900 - 06/29/2014    Past Medical History  Diagnosis Date  . Emphysema     followed by Dr. Joya Gaskins  . S/P thoracic aortic aneurysm repair 1/10    also with bioprosthetic aortic valve prelacement [facility]). in 10/10 she has a descending thoraic aorta anuerysm and abdominal reapir [facility]). Vascular urgeon is Dr. Lunette Stands.   . Pulmonary embolism 2010    She is no onger taking coumadin  . Hypothyroidism   . Blind right eye   . Hyperlipidemia   . S/P aortic valve replacement with bioprosthetic valve     ehco (8/11) with EF 55-60%, mild-mod MR, mild-mod TR, poorly visualized bioprosthetic aortic valve but no significant regurgitation and gradient not significanly elevated.    . CKD (chronic kidney disease)   . HTN (hypertension)   . History of Doppler echocardiogram     carotid dopplers. (8/11) no significant stenosis  . COPD (chronic obstructive pulmonary disease)   . SVT (supraventricular tachycardia)     HOLTER MONITOR AFTER SURGERY   . GERD (gastroesophageal reflux disease)   . Arthritis   . Cataract     RT  . Hx of echocardiogram 2015    Echo (08/2013):  EF 60-65%, no RWMA, Gr 1 DD, AVR ok (mean 5 mmHg), mild MR,  PASP 22 mmHg      Past Surgical History  Procedure Laterality Date  . Thoracoabdominal aortic aneurysm repair  01/28/09    repair  . Anomalous pulmonary venous return repair    . Thoracic aortic aneurysm repair  1/10    VALVE ALSO DONE  . Back surgery  1970   . Eye surgery      AS BABY- RT EYE  POOR SIGHT  . Tonsillectomy    . Lumbar laminectomy/decompression microdiscectomy  05/31/2011    Procedure: LUMBAR LAMINECTOMY/DECOMPRESSION MICRODISCECTOMY;  Surgeon: Elaina Hoops, MD;  Location: [place] [facility];  Service: Neurosurgery;  Laterality: Bilateral;  Bilateral Lumbar three-four  decompressionj lumbar laminectomy       Current Facility-Administered Medications  Medication Dose Route Frequency Provider Last Rate Last Dose  . 0.9 %  sodium chloride infusion   Intravenous Continuous Toy Baker, MD 100 mL/hr at 06/29/14 0322    . acetaminophen (TYLENOL) tablet 650 mg  650 mg Oral Q6H PRN Toy Baker, MD       Or  . acetaminophen (TYLENOL) suppository 650 mg  650 mg Rectal Q6H PRN Toy Baker, MD      . albuterol (PROVENTIL) (2.5 MG/3ML) 0.083% nebulizer solution 2.5 mg  2.5 mg Nebulization Q4H PRN Toy Baker, MD      . aspirin EC tablet 81 mg  81 mg Oral Daily Toy Baker, MD      . atorvastatin (LIPITOR) tablet 20 mg  20 mg Oral q1800 Toy Baker, MD      . ciprofloxacin (CIPRO) IVPB 400 mg  400 mg Intravenous Q12H Toy Baker, MD      . donepezil (ARICEPT) tablet 10 mg  10 mg Oral QHS Toy Baker, MD      . enoxaparin (LOVENOX) injection 30 mg  30 mg Subcutaneous Q24H Toy Baker, MD      . fentaNYL (SUBLIMAZE) injection 50 mcg  50 mcg Intravenous Q2H PRN Ernestina Patches, MD      . HYDROcodone-acetaminophen (NORCO/VICODIN) 5-325 MG per tablet 1-2 tablet  1-2 tablet Oral Q4H PRN Toy Baker, MD      . levothyroxine (SYNTHROID, LEVOTHROID) tablet 112 mcg  112 mcg Oral QAC breakfast Toy Baker, MD      . memantine (NAMENDA XR) 24 hr capsule 28 mg  28 mg Oral Daily Toy Baker, MD      . metoprolol tartrate (LOPRESSOR) tablet 25 mg  25 mg Oral BID Toy Baker, MD   25 mg at 06/29/14 0422  . metroNIDAZOLE (FLAGYL) IVPB 500 mg  500 mg Intravenous Q8H Anastassia Doutova, MD      . mometasone-formoterol (DULERA) 100-5 MCG/ACT inhaler 2 puff  2 puff Inhalation BID Toy Baker, MD      . morphine 2 MG/ML injection 2 mg  2 mg Intravenous Q4H PRN Toy Baker, MD      . ondansetron (ZOFRAN) tablet 4 mg  4 mg Oral Q6H PRN Toy Baker, MD       Or  . ondansetron  (ZOFRAN) injection 4 mg  4 mg Intravenous Q6H PRN Toy Baker, MD      . saccharomyces boulardii (FLORASTOR) capsule 250 mg  250 mg Oral BID Toy Baker, MD      . sertraline (ZOLOFT) tablet 50 mg  50 mg Oral Daily Toy Baker, MD          Allergies  Allergen Reactions  . Codeine Other (See Comments)    Pt. States it has been too long to remember  the reaction   . Penicillins Other (See Comments)    Pt. Does not recall the reaction.     REVIEW OF SYSTEMS: Skin:  No history of rash.  No history of abnormal moles. Infection:  History of C. Diff Neurologic:  No history of stroke.  No history of seizure.  Loss of vision in Right eye. Cardiac:  HTN   History of aortic valve replacement. Pulmonary:  Emphysema/COPD - Dr. Joya Gaskins. She quit smoking about 10 years ago.  HIstory of PE.  Endocrine:  No diabetes. On thyroid replacement Gastrointestinal:  GERD.  No history of liver disease.  No history of gall bladder disease.  No history of pancreas disease.  No history of colon disease. Urologic:  No history of kidney stones.  No history of bladder infections. Musculoskeletal:  No history of joint or back disease. Hematologic:  No bleeding disorder.  No history of anemia.  Not anticoagulated. Psycho-social:  The patient is oriented.   The patient has no obvious psychologic or social impairment to understanding our conversation and plan.  SOCIAL and FAMILY HISTORY: Widowed.  Her husband died in Apr 30, 2014. She lives by herself. She has 3 children - one son (who lives in [place]) and 2 daughters.  PHYSICAL EXAM: BP 108/49 mmHg  Pulse 66  Temp(Src) 99 F (37.2 C) (Oral)  Resp 16  Ht 5' (1.524 m)  Wt 131 lb (59.421 kg)  BMI 25.58 kg/m2  SpO2 98%  General: Mildly obese WF who is alert. HEENT: Normal. No vision in right eye. Neck: Supple. No mass.  No thyroid mass. Lymph Nodes:  No supraclavicular or cervical nodes. Lungs: Clear to auscultation and symmetric breath  sounds. Heart:  RRR. No murmur or rub. Abdomen: Soft. No mass.  LUQ scar.  Minimal abdominal tenderness.  She says this is much better than yesterday.  I cannot feel right inguinal hernia with her supine. Rectal: Not done. Extremities:  Good strength and ROM  in upper and lower extremities. Neurologic:  Grossly intact to motor and sensory function. Psychiatric: Has normal mood and affect. Behavior is normal.   DATA REVIEWED: Epic notes  Alphonsa Overall, MD,  [facility] [address] [place].,  [place], [place]    [place] Phone:  [place]:  [phone]

## 2014-06-29 NOTE — H&P (Signed)
PCP:  Tommy Medal, MD    Chief Complaint:  Diarrhea and abdominal pain HPI: Sarah Johnson is a 81 y.o. female   has a past medical history of Emphysema; S/P thoracic aortic aneurysm repair (1/10); Pulmonary embolism (2010); Hypothyroidism; Blind right eye; Hyperlipidemia; S/P aortic valve replacement with bioprosthetic valve; CKD (chronic kidney disease); HTN (hypertension); History of Doppler echocardiogram; COPD (chronic obstructive pulmonary disease); SVT (supraventricular tachycardia); GERD (gastroesophageal reflux disease); Arthritis; Cataract; and echocardiogram (2015).   Presented with  Patient has  Hx of prior diverticulitis resulting in E.coli sepsis in 2014. In November she developed c.dif and completed course with improvement. Patient started to have abdominal pain today she had a fever up to 101.4. She reports diarrhea starting yesterday. She had a tooth extraction and started on erythromycin.  On Arrival to ER patient met sepsis criteria given elevated WBC up to 19.7 fever up top 100.8, BP soft 110/46 initially but now improved.   CT of the abdomen showed Acute diverticulitis along the distal sigmoid colon, with diffuse soft tissue inflammation and trace free fluid. Small perforation noted, with two small foci of air seen tracking superiorly along the mesentery. Cholelithiasis without evidence of cholecystitis noted on [place].  She has extensive medical history including aortic valve replacement due to hx of aortic aneurism. She has Aneurysmal dilatation of the distal thoracic and proximal abdominal aorta that is grossly stable.    Hospitalist was called for admission for diverticulitis with microperforation  Review of Systems:    Pertinent positives include: Fevers, chills diarrhea,  abdominal pain,  Constitutional:  No weight loss, night sweats, , fatigue, weight loss  HEENT:  No headaches, Difficulty swallowing,Tooth/dental problems,Sore throat,  No sneezing, itching,  ear ache, nasal congestion, post nasal drip,  Cardio-vascular:  No chest pain, Orthopnea, PND, anasarca, dizziness, palpitations.no Bilateral lower extremity swelling  GI:  No heartburn, indigestion, nausea, vomiting, change in bowel habits, loss of appetite, melena, blood in stool, hematemesis Resp:  no shortness of breath at rest. No dyspnea on exertion, No excess mucus, no productive cough, No non-productive cough, No coughing up of blood.No change in color of mucus.No wheezing. Skin:  no rash or lesions. No jaundice GU:  no dysuria, change in color of urine, no urgency or frequency. No straining to urinate.  No flank pain.  Musculoskeletal:  No joint pain or no joint swelling. No decreased range of motion. No back pain.  Psych:  No change in mood or affect. No depression or anxiety. No memory loss.  Neuro: no localizing neurological complaints, no tingling, no weakness, no double vision, no gait abnormality, no slurred speech, no confusion  Otherwise ROS are negative except for above, 10 systems were reviewed  Past Medical History: Past Medical History  Diagnosis Date  . Emphysema     followed by Dr. Joya Gaskins  . S/P thoracic aortic aneurysm repair 1/10    also with bioprosthetic aortic valve prelacement [facility]). in 10/10 she has a descending thoraic aorta anuerysm and abdominal reapir [facility]). Vascular urgeon is Dr. Lunette Stands.   . Pulmonary embolism 2010    She is no onger taking coumadin  . Hypothyroidism   . Blind right eye   . Hyperlipidemia   . S/P aortic valve replacement with bioprosthetic valve     ehco (8/11) with EF 55-60%, mild-mod MR, mild-mod TR, poorly visualized bioprosthetic aortic valve but no significant regurgitation and gradient not significanly elevated.    . CKD (chronic kidney disease)   .  HTN (hypertension)   . History of Doppler echocardiogram     carotid dopplers. (8/11) no significant stenosis  . COPD (chronic obstructive pulmonary disease)    . SVT (supraventricular tachycardia)     HOLTER MONITOR AFTER SURGERY   . GERD (gastroesophageal reflux disease)   . Arthritis   . Cataract     RT  . Hx of echocardiogram 2015    Echo (08/2013):  EF 60-65%, no RWMA, Gr 1 DD, AVR ok (mean 5 mmHg), mild MR, PASP 22 mmHg   Past Surgical History  Procedure Laterality Date  . Thoracoabdominal aortic aneurysm repair  01/28/09    repair  . Anomalous pulmonary venous return repair    . Thoracic aortic aneurysm repair  1/10    VALVE ALSO DONE  . Back surgery  1970   . Eye surgery      AS BABY- RT EYE  POOR SIGHT  . Tonsillectomy    . Lumbar laminectomy/decompression microdiscectomy  05/31/2011    Procedure: LUMBAR LAMINECTOMY/DECOMPRESSION MICRODISCECTOMY;  Surgeon: Elaina Hoops, MD;  Location: [facility] [place] [facility];  Service: Neurosurgery;  Laterality: Bilateral;  Bilateral Lumbar three-four decompressionj lumbar laminectomy      Medications: Prior to Admission medications   Medication Sig Start Date End Date Taking? Authorizing Provider  acetaminophen (TYLENOL) 325 MG tablet Take 2 tablets (650 mg total) by mouth every 4 (four) hours as needed for fever, headache or mild pain. 03/29/14  Yes Robbie Lis, MD  amLODipine (NORVASC) 5 MG tablet Take 5 mg by mouth daily.     Yes Historical Provider, MD  aspirin EC 81 MG tablet Take 1 tablet (81 mg total) by mouth daily. 05/04/11  Yes Larey Dresser, MD  atorvastatin (LIPITOR) 20 MG tablet TAKE 1 TABLET BY MOUTH EVERY DAY   Yes Larey Dresser, MD  Calcium Carbonate-Vitamin D (CALCIUM-VITAMIN D) 600-200 MG-UNIT CAPS Take 1 tablet by mouth daily.   Yes Historical Provider, MD  donepezil (ARICEPT) 10 MG tablet Take 1 tablet (10 mg total) by mouth at bedtime. 08/20/13  Yes Star Age, MD  ergocalciferol (VITAMIN D2) 50000 UNITS capsule Take 50,000 Units by mouth every 30 (thirty) days.   Yes Historical Provider, MD  erythromycin base (E-MYCIN) 500 MG tablet Take 500 mg by mouth 3 (three) times daily.   Yes  Historical Provider, MD  ibandronate (BONIVA) 150 MG tablet Take 150 mg by mouth every 30 (thirty) days. Take in the morning with a full glass of water, on an empty stomach, and do not take anything else by mouth or lie down for the next 30 min.   Yes Historical Provider, MD  levothyroxine (SYNTHROID, LEVOTHROID) 112 MCG tablet Take 112 mcg by mouth daily before breakfast.   Yes Historical Provider, MD  metoprolol (LOPRESSOR) 25 MG tablet Take 1 tablet (25 mg total) by mouth 2 (two) times daily. 03/31/14  Yes Theodis Blaze, MD  mometasone-formoterol (DULERA) 100-5 MCG/ACT AERO INHALE 2 PUFFS INTO THE LUNGS 2 (TWO) TIMES DAILY. RINSE MOUTH AFTER EACH USE 05/08/14  Yes Elsie Stain, MD  Multiple Vitamin (MULITIVITAMIN WITH MINERALS) TABS Take 1 tablet by mouth daily.   Yes Historical Provider, MD  NAMENDA XR 28 MG CP24 Take 1 tablet by mouth daily. 07/17/13  Yes Historical Provider, MD  PRESCRIPTION MEDICATION Take 1 capsule by mouth as directed. Patient has an Antibiotic prescribed by dentist that patient takes 1 hour before dental work due to artificial heart valves. Unsure of name of  antibiotic and can not clarify what it is due to it being an old refill patient keeps at home for dental work.   Yes Historical Provider, MD  PROAIR HFA 108 (90 BASE) MCG/ACT inhaler Inhale 2 puffs into the lungs every 6 (six) hours as needed. Patient taking differently: Inhale 2 puffs into the lungs every 6 (six) hours as needed for wheezing or shortness of breath.  05/08/14  Yes Elsie Stain, MD  saccharomyces boulardii (FLORASTOR) 250 MG capsule Take 1 capsule (250 mg total) by mouth 2 (two) times daily. 03/29/14  Yes Robbie Lis, MD  sertraline (ZOLOFT) 50 MG tablet Take 50 mg by mouth daily.  07/19/13  Yes Historical Provider, MD    Allergies:   Allergies  Allergen Reactions  . Codeine Other (See Comments)    Pt. States it has been too long to remember the reaction   . Penicillins Other (See Comments)     Pt. Does not recall the reaction.     Social History:  Ambulatory   independently   Lives at home alone,         reports that she quit smoking about 10 years ago. Her smoking use included Cigarettes. She has a 20 pack-year smoking history. She has never used smokeless tobacco. She reports that she does not drink alcohol or use illicit drugs.    Family History: family history includes Brain cancer in her maternal grandmother; Coronary artery disease in an other family member.    Physical Exam: Patient Vitals for the past 24 hrs:  BP Temp Temp src Pulse Resp SpO2 Height Weight  06/29/14 0100 121/55 mmHg 98.5 F (36.9 C) Oral 67 16 98 % - -  06/28/14 1936 (!) 112/50 mmHg - - 82 17 93 % 5' (1.524 m) 59.421 kg (131 lb)  06/28/14 1911 - 100.8 F (38.2 C) Rectal - - - - -  06/28/14 1753 (!) 110/46 mmHg 100.3 F (37.9 C) Oral 89 16 93 % - -    1. General:  in No Acute distress 2. Psychological: Alert and   Oriented 3. Head/ENT:   Moist   Mucous Membranes                          Head Non traumatic, neck supple                          Normal  entition 4. SKIN:  decreased Skin turgor,  Skin clean Dry and intact no rash 5. Heart: Regular rate and rhythm sharp systolic Murmur, Rub or gallop 6. Lungs: Clear to auscultation bilaterally, no wheezes or crackles   7. Abdomen: Soft, non-tender, Non distended 8. Lower extremities: no clubbing, cyanosis, or edema 9. Neurologically Grossly intact, moving all 4 extremities equally 10. MSK: Normal range of motion  body mass index is 25.58 kg/(m^2).   Labs on Admission:   Results for orders placed or performed during the hospital encounter of 06/28/14 (from the past 24 hour(s))  CBC     Status: Abnormal   Collection Time: 06/28/14  6:21 PM  Result Value Ref Range   WBC 19.7 (H) 4.0 - 10.5 K/uL   RBC 4.11 3.87 - 5.11 MIL/uL   Hemoglobin 11.5 (L) 12.0 - 15.0 g/dL   HCT 37.2 36.0 - 46.0 %   MCV 90.5 78.0 - 100.0 fL   MCH 28.0 26.0 - 34.0  pg   MCHC  30.9 30.0 - 36.0 g/dL   RDW 16.0 (H) 11.5 - 15.5 %   Platelets 179 150 - 400 K/uL  Basic metabolic panel     Status: Abnormal   Collection Time: 06/28/14  6:21 PM  Result Value Ref Range   Sodium 137 135 - 145 mmol/L   Potassium 3.6 3.5 - 5.1 mmol/L   Chloride 107 96 - 112 mmol/L   CO2 24 19 - 32 mmol/L   Glucose, Bld 108 (H) 70 - 99 mg/dL   BUN 19 6 - 23 mg/dL   Creatinine, Ser 1.51 (H) 0.50 - 1.10 mg/dL   Calcium 8.8 8.4 - 10.5 mg/dL   GFR calc non Af Amer 32 (L) >90 mL/min   GFR calc Af Amer 37 (L) >90 mL/min   Anion gap 6 5 - 15  Lipase, blood     Status: None   Collection Time: 06/28/14  6:21 PM  Result Value Ref Range   Lipase 31 11 - 59 U/L  I-Stat CG4 Lactic Acid, ED     Status: None   Collection Time: 06/28/14  6:33 PM  Result Value Ref Range   Lactic Acid, Venous 1.07 0.5 - 2.0 mmol/L  Urinalysis, Routine w reflex microscopic     Status: Abnormal   Collection Time: 06/28/14  9:24 PM  Result Value Ref Range   Color, Urine YELLOW YELLOW   APPearance CLEAR CLEAR   Specific Gravity, Urine 1.019 1.005 - 1.030   pH 5.5 5.0 - 8.0   Glucose, UA NEGATIVE NEGATIVE mg/dL   Hgb urine dipstick TRACE (A) NEGATIVE   Bilirubin Urine NEGATIVE NEGATIVE   Ketones, ur NEGATIVE NEGATIVE mg/dL   Protein, ur NEGATIVE NEGATIVE mg/dL   Urobilinogen, UA 0.2 0.0 - 1.0 mg/dL   Nitrite NEGATIVE NEGATIVE   Leukocytes, UA LARGE (A) NEGATIVE  Urine microscopic-add on     Status: Abnormal   Collection Time: 06/28/14  9:24 PM  Result Value Ref Range   Squamous Epithelial / LPF FEW (A) RARE   WBC, UA 21-50 <3 WBC/hpf   RBC / HPF 0-2 <3 RBC/hpf   Bacteria, UA RARE RARE  Lipase, blood     Status: None   Collection Time: 06/28/14 10:18 PM  Result Value Ref Range   Lipase 27 11 - 59 U/L  Hepatic function panel     Status: Abnormal   Collection Time: 06/28/14 10:18 PM  Result Value Ref Range   Total Protein 6.6 6.0 - 8.3 g/dL   Albumin 3.3 (L) 3.5 - 5.2 g/dL   AST 23 0 - 37 U/L     ALT 13 0 - 35 U/L   Alkaline Phosphatase 58 39 - 117 U/L   Total Bilirubin 0.4 0.3 - 1.2 mg/dL   Bilirubin, Direct 0.1 0.0 - 0.5 mg/dL   Indirect Bilirubin 0.3 0.3 - 0.9 mg/dL    UA WBC 21-50  No results found for: HGBA1C  Estimated Creatinine Clearance: 24.4 mL/min (by C-G formula based on Cr of 1.51).  BNP (last 3 results) No results for input(s): PROBNP in the last 8760 hours.  Other results:  I have pearsonaly reviewed this: ECG not obtained   [facility] Weights   06/28/14 1936  Weight: 59.421 kg (131 lb)     Cultures:    Component Value Date/Time   SDES URINE, CLEAN CATCH 03/26/2014 [place], CLEAN CATCH 03/26/2014 [place] 03/26/2014 [place] 03/26/2014 0415   CULT  03/26/2014 0415    Multiple bacterial morphotypes present, none predominant. Suggest appropriate recollection if clinically indicated. Performed at [facility]    REPTSTATUS 03/27/2014 FINAL 03/26/2014 0415   REPTSTATUS 03/26/2014 FINAL 03/26/2014 0415     Radiological Exams on Admission: Ct Abdomen Pelvis Wo Contrast  06/29/2014   CLINICAL DATA:  Acute onset of fever. Not feeling well. Diarrhea. Leukocytosis. Initial encounter.  EXAM: CT ABDOMEN AND PELVIS WITHOUT CONTRAST  TECHNIQUE: Multidetector CT imaging of the abdomen and pelvis was performed following the standard protocol without IV contrast.  COMPARISON:  CT of the abdomen and pelvis from 03/25/2014  FINDINGS: Minimal left basilar atelectasis is noted.  The liver is unremarkable in appearance. The spleen is absent. Stones are noted dependently within the gallbladder; the gallbladder is otherwise unremarkable. The pancreas and adrenal glands are unremarkable.  A 3.7 cm cyst is noted at the upper pole of the right kidney. Mild nonspecific perinephric stranding is noted on the left. The kidneys are otherwise unremarkable. No hydronephrosis is seen. No renal or ureteral stones are identified.  There is  herniation of a short segment of distal ileum into a small right inguinal hernia, without evidence for obstruction. The visualized small bowel is otherwise unremarkable. The stomach is within normal limits. No acute vascular abnormalities are seen.  There is aneurysmal dilatation of the distal thoracic and proximal abdominal aorta, measuring up to 4.8 cm just above the diaphragm, and 3.7 cm along the proximal abdominal aorta. Aneurysmal dilatation resolves just distal to the origin of the inferior mesenteric artery. Scattered calcification is seen along the abdominal aorta.  The celiac trunk, superior mesenteric artery and bilateral renal arteries are grossly unremarkable.  The appendix is grossly unremarkable in appearance; there is no evidence for appendicitis.  There is acute diverticulitis along the distal sigmoid colon, with diffuse soft tissue inflammation and trace free fluid. Two small foci of air are seen tracking superiorly along the mesentery, concerning for a small perforation.  Scattered diverticulosis is noted along the distal descending and sigmoid colon. Contrast progresses to the level of the rectum. The colon is otherwise unremarkable.  The bladder is mildly distended and grossly unremarkable. The uterus is unremarkable in appearance. The ovaries are grossly symmetric; no suspicious adnexal masses are seen. No inguinal lymphadenopathy is seen.  No acute osseous abnormalities are identified. Vacuum phenomenon is noted at L3-L4 and L4-L5. There is partial sacralization of vertebral body L5. Underlying facet disease is noted. There is grade 1 anterolisthesis of L4 on L5.  IMPRESSION: 1. Acute diverticulitis along the distal sigmoid colon, with diffuse soft tissue inflammation and trace free fluid. Small perforation noted, with two small foci of air seen tracking superiorly along the mesentery. 2. Scattered diverticulosis along the distal descending and sigmoid colon. 3. Aneurysmal dilatation of the  distal thoracic and proximal abdominal aorta is grossly stable, measuring up to 4.8 cm just above the diaphragm, and 3.7 cm along the proximal abdominal aorta. Aneurysmal dilatation resolves just distal to the origin of the inferior mesenteric artery. Scattered calcification along the abdominal aorta. 4. Cholelithiasis; gallbladder otherwise unremarkable in appearance. 5. Right renal cyst noted. 6. Mild degenerative change along the lower lumbar spine.  These results were called by telephone at the time of interpretation on 06/29/2014 at 12:25 am to Dr. Toy Cookey, who verbally acknowledged these results.   Electronically Signed   By: Roanna Raider M.D.   On: 06/29/2014 00:34   Dg Chest 2 View  06/28/2014  CLINICAL DATA:  Fever.  COPD.  EXAM: CHEST  2 VIEW  COMPARISON:  03/27/2014  FINDINGS: Mild cardiomegaly remains stable. No evidence of pulmonary infiltrate or edema. No evidence of pleural effusion. Mild scarring again seen in left lung base. Previous median sternotomy again noted.  IMPRESSION: Stable mild cardiomegaly and left basilar scarring. No active disease.   Electronically Signed   By: Earle Gell M.D.   On: 06/28/2014 20:58   US Abdomen Limited Ruq  06/28/2014   CLINICAL DATA:  Right upper quadrant pain history of gallstones  EXAM: US ABDOMEN LIMITED - RIGHT UPPER QUADRANT  COMPARISON:  None.  FINDINGS: Gallbladder:  Cholelithiasis. There is wall thickening to 4 mm but this is likely from underdistention of the gallbladder. No focal tenderness.  Common bile duct:  Diameter: 4 mm  Liver:  No focal lesion identified. Within normal limits in parenchymal echogenicity.  Partly visible cyst from the right kidney measuring approximately 3 cm.  IMPRESSION: Cholelithiasis without acute cholecystitis.   Electronically Signed   By: Monte Fantasia M.D.   On: 06/28/2014 23:33    Chart has been reviewed  Assessment/Plan 81 yo F history of prior diverticulitis resulting in sepsis and hx of c. Diff  admitted with diverticulitis with microperforation and UTI Present on Admission: . Diverticulitis cover   with cipro and flagyl, surgery has been consulted Dr. Lucia Gaskins is aware . Essential hypertension - continuemetoprolol . COPD with chronic bronchitis - chronic now stable . CKD (chronic kidney disease) -stable . UTI (lower urinary tract infection) urine culture pending will cover with cipro Diarrhea give hx of C.diffand recent antibiotic expose will test for c.dif. And write for contact precoutions  Prophylaxis:   Lovenox,    CODE STATUS:  FULL CODE    Other plan as per orders.  I have spent a total of 55 min on this admission  Avalina Benko 06/29/2014, 1:35 AM  [facility]  Pager [phone]   after 2 AM please page floor coverage PA If 7AM-7PM, please contact the day team taking care of the patient  [email or web address]  Password [email or web address]

## 2014-06-29 NOTE — Progress Notes (Signed)
[facility] PROGRESS NOTE  Assessment/Plan: Diverticulitis of colon with perforation - started on cipro and flagyl IV. - feels better. - unlikely a UTI awaiting UC.  History of Clostridium difficile colitis - cont contact. - cont florastor.  AKI on  CKD (chronic kidney disease) - Started on IV fluids, with some improvement in Cr. - cont IV hydration most likely pre-renal.  Essential hypertension -stable, cont metoprolol.    Code Status: full Family Communication: noen  Disposition Plan: inpatient   Consultants:  surgery  Procedures:  CT ab and pelvis  Antibiotics:  Cipro and flagyl  HPI/Subjective: No complains feels better.  Objective: Filed Vitals:   06/28/14 1936 06/29/14 0100 06/29/14 0200 06/29/14 0630  BP: 112/50 121/55 120/55 108/49  Pulse: 82 67 69 66  Temp:  98.5 F (36.9 C) 98.5 F (36.9 C) 99 F (37.2 C)  TempSrc:  Oral Oral Oral  Resp: 17 16 16 16   Height: 5' (1.524 m)     Weight: 59.421 kg (131 lb)     SpO2: 93% 98% 99% 98%    Intake/Output Summary (Last 24 hours) at 06/29/14 1057 Last data filed at 06/29/14 0600  Gross per 24 hour  Intake 363.33 ml  Output      0 ml  Net 363.33 ml   Filed Weights   06/28/14 1936  Weight: 59.421 kg (131 lb)    Exam:  General: Alert, awake, oriented x3, in no acute distress.  HEENT: No bruits, no goiter.  Heart: Regular rate and rhythm. Lungs: Good air movement, clear Abdomen: Soft, nontender, nondistended, positive bowel sounds.  Neuro: Grossly intact, nonfocal.   Data Reviewed: Basic Metabolic Panel:  Recent Labs Lab 06/28/14 1821 06/29/14 0355  NA 137 135  K 3.6 3.7  CL 107 107  CO2 24 22  GLUCOSE 108* 102*  BUN 19 15  CREATININE 1.51* 1.20*  CALCIUM 8.8 8.1*  MG  --  1.5  PHOS  --  3.0   Liver Function Tests:  Recent Labs Lab 06/28/14 2218 06/29/14 0355  AST 23 20  ALT 13 12  ALKPHOS 58 55  BILITOT 0.4 0.8  PROT 6.6 6.0  ALBUMIN 3.3* 3.0*     Recent Labs Lab 06/28/14 1821 06/28/14 2218  LIPASE 31 27   No results for input(s): AMMONIA in the last 168 hours. CBC:  Recent Labs Lab 06/28/14 1821 06/29/14 0355  WBC 19.7* 17.9*  HGB 11.5* 10.7*  HCT 37.2 33.9*  MCV 90.5 90.4  PLT 179 232   Cardiac Enzymes: No results for input(s): CKTOTAL, CKMB, CKMBINDEX, TROPONINI in the last 168 hours. BNP (last 3 results) No results for input(s): BNP in the last 8760 hours.  ProBNP (last 3 results) No results for input(s): PROBNP in the last 8760 hours.  CBG: No results for input(s): GLUCAP in the last 168 hours.  No results found for this or any previous visit (from the past 240 hour(s)).   Studies: Ct Abdomen Pelvis Wo Contrast  06/29/2014   CLINICAL DATA:  Acute onset of fever. Not feeling well. Diarrhea. Leukocytosis. Initial encounter.  EXAM: CT ABDOMEN AND PELVIS WITHOUT CONTRAST  TECHNIQUE: Multidetector CT imaging of the abdomen and pelvis was performed following the standard protocol without IV contrast.  COMPARISON:  CT of the abdomen and pelvis from 03/25/2014  FINDINGS: Minimal left basilar atelectasis is noted.  The liver is unremarkable in appearance. The spleen is absent. Stones are noted dependently within the gallbladder; the gallbladder is otherwise unremarkable.  The pancreas and adrenal glands are unremarkable.  A 3.7 cm cyst is noted at the upper pole of the right kidney. Mild nonspecific perinephric stranding is noted on the left. The kidneys are otherwise unremarkable. No hydronephrosis is seen. No renal or ureteral stones are identified.  There is herniation of a short segment of distal ileum into a small right inguinal hernia, without evidence for obstruction. The visualized small bowel is otherwise unremarkable. The stomach is within normal limits. No acute vascular abnormalities are seen.  There is aneurysmal dilatation of the distal thoracic and proximal abdominal aorta, measuring up to 4.8 cm just above the  diaphragm, and 3.7 cm along the proximal abdominal aorta. Aneurysmal dilatation resolves just distal to the origin of the inferior mesenteric artery. Scattered calcification is seen along the abdominal aorta.  The celiac trunk, superior mesenteric artery and bilateral renal arteries are grossly unremarkable.  The appendix is grossly unremarkable in appearance; there is no evidence for appendicitis.  There is acute diverticulitis along the distal sigmoid colon, with diffuse soft tissue inflammation and trace free fluid. Two small foci of air are seen tracking superiorly along the mesentery, concerning for a small perforation.  Scattered diverticulosis is noted along the distal descending and sigmoid colon. Contrast progresses to the level of the rectum. The colon is otherwise unremarkable.  The bladder is mildly distended and grossly unremarkable. The uterus is unremarkable in appearance. The ovaries are grossly symmetric; no suspicious adnexal masses are seen. No inguinal lymphadenopathy is seen.  No acute osseous abnormalities are identified. Vacuum phenomenon is noted at L3-L4 and L4-L5. There is partial sacralization of vertebral body L5. Underlying facet disease is noted. There is grade 1 anterolisthesis of L4 on L5.  IMPRESSION: 1. Acute diverticulitis along the distal sigmoid colon, with diffuse soft tissue inflammation and trace free fluid. Small perforation noted, with two small foci of air seen tracking superiorly along the mesentery. 2. Scattered diverticulosis along the distal descending and sigmoid colon. 3. Aneurysmal dilatation of the distal thoracic and proximal abdominal aorta is grossly stable, measuring up to 4.8 cm just above the diaphragm, and 3.7 cm along the proximal abdominal aorta. Aneurysmal dilatation resolves just distal to the origin of the inferior mesenteric artery. Scattered calcification along the abdominal aorta. 4. Cholelithiasis; gallbladder otherwise unremarkable in appearance. 5.  Right renal cyst noted. 6. Mild degenerative change along the lower lumbar spine.  These results were called by telephone at the time of interpretation on 06/29/2014 at 12:25 am to Dr. Ernestina Patches, who verbally acknowledged these results.   Electronically Signed   By: Garald Balding M.D.   On: 06/29/2014 00:34   Dg Chest 2 View  06/28/2014   CLINICAL DATA:  Fever.  COPD.  EXAM: CHEST  2 VIEW  COMPARISON:  03/27/2014  FINDINGS: Mild cardiomegaly remains stable. No evidence of pulmonary infiltrate or edema. No evidence of pleural effusion. Mild scarring again seen in left lung base. Previous median sternotomy again noted.  IMPRESSION: Stable mild cardiomegaly and left basilar scarring. No active disease.   Electronically Signed   By: Earle Gell M.D.   On: 06/28/2014 20:58   US Abdomen Limited Ruq  06/28/2014   CLINICAL DATA:  Right upper quadrant pain history of gallstones  EXAM: US ABDOMEN LIMITED - RIGHT UPPER QUADRANT  COMPARISON:  None.  FINDINGS: Gallbladder:  Cholelithiasis. There is wall thickening to 4 mm but this is likely from underdistention of the gallbladder. No focal tenderness.  Common bile duct:  Diameter: 4 mm  Liver:  No focal lesion identified. Within normal limits in parenchymal echogenicity.  Partly visible cyst from the right kidney measuring approximately 3 cm.  IMPRESSION: Cholelithiasis without acute cholecystitis.   Electronically Signed   By: Monte Fantasia M.D.   On: 06/28/2014 23:33    Scheduled Meds: . aspirin EC  81 mg Oral Daily  . atorvastatin  20 mg Oral q1800  . ciprofloxacin  400 mg Intravenous Q12H  . donepezil  10 mg Oral QHS  . enoxaparin (LOVENOX) injection  30 mg Subcutaneous Q24H  . levothyroxine  112 mcg Oral QAC breakfast  . memantine  28 mg Oral Daily  . metoprolol  25 mg Oral BID  . metronidazole  500 mg Intravenous Q8H  . mometasone-formoterol  2 puff Inhalation BID  . saccharomyces boulardii  250 mg Oral BID  . sertraline  50 mg Oral Daily    Continuous Infusions: . sodium chloride 100 mL/hr at 06/29/14 0322     Charlynne Cousins  [facility] Pager [phone]. If 7PM-7AM, please contact night-coverage at [email or web address], password [facility] 06/29/2014, 10:57 AM  LOS: 0 days

## 2014-06-30 DIAGNOSIS — A047 Enterocolitis due to Clostridium difficile: Secondary | ICD-10-CM

## 2014-06-30 LAB — URINE CULTURE: Colony Count: 50000

## 2014-06-30 MED ORDER — WHITE PETROLATUM GEL
Status: DC | PRN
  Administered 2014-06-30: 1 via TOPICAL
  Filled 2014-06-30 (×2): qty 28.35

## 2014-06-30 NOTE — Progress Notes (Signed)
General Surgery Note  LOS: 1 day  POD -     Assessment/Plan: 1. Diverticulitis with probable focal perforation Cipro/Flagyl - 3/5 >>>  WBC - 17,900 - 06/30/2014  No abdominal pain, but not hungry  Seems to be getting better  Will keep NPO until tomorrow - but may start clear liquids tomorrow  2.  C. Diff - she has had this before!  Not sure how it is related to the diverticulitis  She is having some loose stools  3. Emphysema/COPD 4. History of PE 5. Hypothyroid 6. HTN 7. History of thoracic aorta repair  Has 4.8 x 3.7 cm aneurysm now on CT 8. Aortic valve replacement 9. Blind right eye 10. Creatinine elevated - 1.2 - 3/4//2016 11. Right inguinal hernia with SB - not incarcerated 12. Gall stones 13.  DVT prophylaxis - Lovenox 14.  Activity  She needs to ambulate more.   Active Problems:   Essential hypertension   COPD with chronic bronchitis   S/P aortic valve replacement   CKD (chronic kidney disease)   Diverticulitis   History of Clostridium difficile colitis   Diverticulitis of colon with perforation   Sepsis   Subjective:  Doing okay.  No abdominal pain  But has not been out of bed. Objective:   Filed Vitals:   06/30/14 0603  BP: 129/54  Pulse: 64  Temp: 97.7 F (36.5 C)  Resp: 18     Intake/Output from previous day:  03/05 0701 - 03/06 0700 In: -  Out: 200 [Urine:200]  Intake/Output this shift:      Physical Exam:   General: WN Older WF who is alert and oriented.    HEENT: Normal. Pupils equal. .   Lungs: Clear   Abdomen: Has bowel sounds.  No localized tenderness.   Lab Results:    Recent Labs  06/28/14 1821 06/29/14 0355  WBC 19.7* 17.9*  HGB 11.5* 10.7*  HCT 37.2 33.9*  PLT 179 232    BMET   Recent Labs  06/28/14 1821 06/29/14 0355  NA 137 135  K 3.6 3.7  CL 107 107  CO2 24 22  GLUCOSE 108* 102*  BUN 19 15  CREATININE 1.51* 1.20*  CALCIUM 8.8 8.1*    PT/INR  No results for input(s):  LABPROT, INR in the last 72 hours.  ABG  No results for input(s): PHART, HCO3 in the last 72 hours.  Invalid input(s): PCO2, PO2   Studies/Results:  Ct Abdomen Pelvis Wo Contrast  06/29/2014   CLINICAL DATA:  Acute onset of fever. Not feeling well. Diarrhea. Leukocytosis. Initial encounter.  EXAM: CT ABDOMEN AND PELVIS WITHOUT CONTRAST  TECHNIQUE: Multidetector CT imaging of the abdomen and pelvis was performed following the standard protocol without IV contrast.  COMPARISON:  CT of the abdomen and pelvis from 03/25/2014  FINDINGS: Minimal left basilar atelectasis is noted.  The liver is unremarkable in appearance. The spleen is absent. Stones are noted dependently within the gallbladder; the gallbladder is otherwise unremarkable. The pancreas and adrenal glands are unremarkable.  A 3.7 cm cyst is noted at the upper pole of the right kidney. Mild nonspecific perinephric stranding is noted on the left. The kidneys are otherwise unremarkable. No hydronephrosis is seen. No renal or ureteral stones are identified.  There is herniation of a short segment of distal ileum into a small right inguinal hernia, without evidence for obstruction. The visualized small bowel is otherwise unremarkable. The stomach is within normal limits. No acute vascular abnormalities are seen.  There is aneurysmal dilatation of the distal thoracic and proximal abdominal aorta, measuring up to 4.8 cm just above the diaphragm, and 3.7 cm along the proximal abdominal aorta. Aneurysmal dilatation resolves just distal to the origin of the inferior mesenteric artery. Scattered calcification is seen along the abdominal aorta.  The celiac trunk, superior mesenteric artery and bilateral renal arteries are grossly unremarkable.  The appendix is grossly unremarkable in appearance; there is no evidence for appendicitis.  There is acute diverticulitis along the distal sigmoid colon, with diffuse soft tissue inflammation and trace free fluid. Two small  foci of air are seen tracking superiorly along the mesentery, concerning for a small perforation.  Scattered diverticulosis is noted along the distal descending and sigmoid colon. Contrast progresses to the level of the rectum. The colon is otherwise unremarkable.  The bladder is mildly distended and grossly unremarkable. The uterus is unremarkable in appearance. The ovaries are grossly symmetric; no suspicious adnexal masses are seen. No inguinal lymphadenopathy is seen.  No acute osseous abnormalities are identified. Vacuum phenomenon is noted at L3-L4 and L4-L5. There is partial sacralization of vertebral body L5. Underlying facet disease is noted. There is grade 1 anterolisthesis of L4 on L5.  IMPRESSION: 1. Acute diverticulitis along the distal sigmoid colon, with diffuse soft tissue inflammation and trace free fluid. Small perforation noted, with two small foci of air seen tracking superiorly along the mesentery. 2. Scattered diverticulosis along the distal descending and sigmoid colon. 3. Aneurysmal dilatation of the distal thoracic and proximal abdominal aorta is grossly stable, measuring up to 4.8 cm just above the diaphragm, and 3.7 cm along the proximal abdominal aorta. Aneurysmal dilatation resolves just distal to the origin of the inferior mesenteric artery. Scattered calcification along the abdominal aorta. 4. Cholelithiasis; gallbladder otherwise unremarkable in appearance. 5. Right renal cyst noted. 6. Mild degenerative change along the lower lumbar spine.  These results were called by telephone at the time of interpretation on 06/29/2014 at 12:25 am to Dr. Ernestina Patches, who verbally acknowledged these results.   Electronically Signed   By: Garald Balding M.D.   On: 06/29/2014 00:34   Dg Chest 2 View  06/28/2014   CLINICAL DATA:  Fever.  COPD.  EXAM: CHEST  2 VIEW  COMPARISON:  03/27/2014  FINDINGS: Mild cardiomegaly remains stable. No evidence of pulmonary infiltrate or edema. No evidence of  pleural effusion. Mild scarring again seen in left lung base. Previous median sternotomy again noted.  IMPRESSION: Stable mild cardiomegaly and left basilar scarring. No active disease.   Electronically Signed   By: Earle Gell M.D.   On: 06/28/2014 20:58   US Abdomen Limited Ruq  06/28/2014   CLINICAL DATA:  Right upper quadrant pain history of gallstones  EXAM: US ABDOMEN LIMITED - RIGHT UPPER QUADRANT  COMPARISON:  None.  FINDINGS: Gallbladder:  Cholelithiasis. There is wall thickening to 4 mm but this is likely from underdistention of the gallbladder. No focal tenderness.  Common bile duct:  Diameter: 4 mm  Liver:  No focal lesion identified. Within normal limits in parenchymal echogenicity.  Partly visible cyst from the right kidney measuring approximately 3 cm.  IMPRESSION: Cholelithiasis without acute cholecystitis.   Electronically Signed   By: Monte Fantasia M.D.   On: 06/28/2014 23:33     Anti-infectives:   Anti-infectives    Start     Dose/Rate Route Frequency Ordered Stop   06/29/14 1400  metroNIDAZOLE (FLAGYL) IVPB 500 mg     500 mg  100 mL/hr over 60 Minutes Intravenous Every 8 hours 06/29/14 0314     06/29/14 1200  ciprofloxacin (CIPRO) IVPB 400 mg     400 mg 200 mL/hr over 60 Minutes Intravenous Every 12 hours 06/29/14 0314     06/29/14 0045  ciprofloxacin (CIPRO) IVPB 400 mg     400 mg 200 mL/hr over 60 Minutes Intravenous  Once 06/29/14 0030 06/29/14 0207   06/29/14 0045  metroNIDAZOLE (FLAGYL) IVPB 500 mg     500 mg 100 mL/hr over 60 Minutes Intravenous  Once 06/29/14 0030 06/29/14 0402      Alphonsa Overall, MD, FACS Pager: [facility] Office: [phone] 06/30/2014

## 2014-06-30 NOTE — Progress Notes (Signed)
[facility] PROGRESS NOTE  Assessment/Plan: Diverticulitis of colon with perforation - Cont  on cipro and flagyl IV. She seems to be improving. - Unlikely a UTI awaiting UC. - leave Pt NPO, appreciate surgery assistance.  History of Clostridium difficile colitis - C. Dif PCR positive. - she is currently on flagyl.  AKI on  CKD (chronic kidney disease) - Cont Iv fluid hydration., most likelt pre-renal etiology. - check b-met in am.  Essential hypertension -stable, cont metoprolol.    Code Status: full Family Communication: noen  Disposition Plan: inpatient   Consultants:  surgery  Procedures:  CT ab and pelvis  Antibiotics:  Cipro and flagyl  HPI/Subjective: No complains feels better.  she is not hungry. Objective: Filed Vitals:   06/29/14 2140 06/30/14 0025 06/30/14 0149 06/30/14 0603  BP:  107/72  129/54  Pulse:  70  64  Temp:  100.3 F (37.9 C) 98.2 F (36.8 C) 97.7 F (36.5 C)  TempSrc:  Oral Oral Oral  Resp:  18  18  Height:      Weight:      SpO2: 94% 93%  99%    Intake/Output Summary (Last 24 hours) at 06/30/14 1033 Last data filed at 06/30/14 0605  Gross per 24 hour  Intake      0 ml  Output    200 ml  Net   -200 ml   Filed Weights   06/28/14 1936  Weight: 59.421 kg (131 lb)    Exam:  General: Alert, awake, oriented x3, in no acute distress.  HEENT: No bruits, no goiter.  Heart: Regular rate and rhythm. Lungs: Good air movement, clear Abdomen: Soft, nontender. Neuro: Grossly intact, nonfocal.   Data Reviewed: Basic Metabolic Panel:  Recent Labs Lab 06/28/14 1821 06/29/14 0355  NA 137 135  K 3.6 3.7  CL 107 107  CO2 24 22  GLUCOSE 108* 102*  BUN 19 15  CREATININE 1.51* 1.20*  CALCIUM 8.8 8.1*  MG  --  1.5  PHOS  --  3.0   Liver Function Tests:  Recent Labs Lab 06/28/14 2218 06/29/14 0355  AST 23 20  ALT 13 12  ALKPHOS 58 55  BILITOT 0.4 0.8  PROT 6.6 6.0  ALBUMIN 3.3* 3.0*    Recent  Labs Lab 06/28/14 1821 06/28/14 2218  LIPASE 31 27   No results for input(s): AMMONIA in the last 168 hours. CBC:  Recent Labs Lab 06/28/14 1821 06/29/14 0355  WBC 19.7* 17.9*  HGB 11.5* 10.7*  HCT 37.2 33.9*  MCV 90.5 90.4  PLT 179 232   Cardiac Enzymes: No results for input(s): CKTOTAL, CKMB, CKMBINDEX, TROPONINI in the last 168 hours. BNP (last 3 results) No results for input(s): BNP in the last 8760 hours.  ProBNP (last 3 results) No results for input(s): PROBNP in the last 8760 hours.  CBG: No results for input(s): GLUCAP in the last 168 hours.  Recent Results (from the past 240 hour(s))  Urine culture     Status: None   Collection Time: 06/28/14  9:24 PM  Result Value Ref Range Status   Specimen Description URINE, CLEAN CATCH  Final   Special Requests NONE  Final   Colony Count   Final    50,000 COLONIES/ML Performed at [facility]    Culture   Final    Multiple bacterial morphotypes present, none predominant. Suggest appropriate recollection if clinically indicated. Performed at [facility]    Report Status 06/30/2014 FINAL  Final  Culture, blood (routine x 2)     Status: None (Preliminary result)   Collection Time: 06/29/14  3:50 AM  Result Value Ref Range Status   Specimen Description BLOOD LEFT ARM  Final   Special Requests BOTTLES DRAWN AEROBIC ONLY 10CC  Final   Culture   Final           BLOOD CULTURE RECEIVED NO GROWTH TO DATE CULTURE WILL BE HELD FOR 5 DAYS BEFORE ISSUING A FINAL NEGATIVE REPORT Note: Culture results may be compromised due to an excessive volume of blood received in culture bottles. Performed at [facility]    Report Status PENDING  Incomplete  Culture, blood (routine x 2)     Status: None (Preliminary result)   Collection Time: 06/29/14  3:55 AM  Result Value Ref Range Status   Specimen Description BLOOD LEFT HAND  Final   Special Requests BOTTLES DRAWN AEROBIC ONLY 10CC  Final   Culture   Final            BLOOD CULTURE RECEIVED NO GROWTH TO DATE CULTURE WILL BE HELD FOR 5 DAYS BEFORE ISSUING A FINAL NEGATIVE REPORT Performed at [facility]    Report Status PENDING  Incomplete  Clostridium Difficile by PCR     Status: Abnormal   Collection Time: 06/29/14 10:51 AM  Result Value Ref Range Status   C difficile by pcr POSITIVE (A) NEGATIVE Final    Comment: CRITICAL RESULT CALLED TO, READ BACK BY AND VERIFIED WITH: WILLARD,W AT 12:25PM ON 06/29/14 BY FESTERMAN,C      Studies: Ct Abdomen Pelvis Wo Contrast  06/29/2014   CLINICAL DATA:  Acute onset of fever. Not feeling well. Diarrhea. Leukocytosis. Initial encounter.  EXAM: CT ABDOMEN AND PELVIS WITHOUT CONTRAST  TECHNIQUE: Multidetector CT imaging of the abdomen and pelvis was performed following the standard protocol without IV contrast.  COMPARISON:  CT of the abdomen and pelvis from 03/25/2014  FINDINGS: Minimal left basilar atelectasis is noted.  The liver is unremarkable in appearance. The spleen is absent. Stones are noted dependently within the gallbladder; the gallbladder is otherwise unremarkable. The pancreas and adrenal glands are unremarkable.  A 3.7 cm cyst is noted at the upper pole of the right kidney. Mild nonspecific perinephric stranding is noted on the left. The kidneys are otherwise unremarkable. No hydronephrosis is seen. No renal or ureteral stones are identified.  There is herniation of a short segment of distal ileum into a small right inguinal hernia, without evidence for obstruction. The visualized small bowel is otherwise unremarkable. The stomach is within normal limits. No acute vascular abnormalities are seen.  There is aneurysmal dilatation of the distal thoracic and proximal abdominal aorta, measuring up to 4.8 cm just above the diaphragm, and 3.7 cm along the proximal abdominal aorta. Aneurysmal dilatation resolves just distal to the origin of the inferior mesenteric artery. Scattered calcification is seen  along the abdominal aorta.  The celiac trunk, superior mesenteric artery and bilateral renal arteries are grossly unremarkable.  The appendix is grossly unremarkable in appearance; there is no evidence for appendicitis.  There is acute diverticulitis along the distal sigmoid colon, with diffuse soft tissue inflammation and trace free fluid. Two small foci of air are seen tracking superiorly along the mesentery, concerning for a small perforation.  Scattered diverticulosis is noted along the distal descending and sigmoid colon. Contrast progresses to the level of the rectum. The colon is otherwise unremarkable.  The bladder is mildly distended  and grossly unremarkable. The uterus is unremarkable in appearance. The ovaries are grossly symmetric; no suspicious adnexal masses are seen. No inguinal lymphadenopathy is seen.  No acute osseous abnormalities are identified. Vacuum phenomenon is noted at L3-L4 and L4-L5. There is partial sacralization of vertebral body L5. Underlying facet disease is noted. There is grade 1 anterolisthesis of L4 on L5.  IMPRESSION: 1. Acute diverticulitis along the distal sigmoid colon, with diffuse soft tissue inflammation and trace free fluid. Small perforation noted, with two small foci of air seen tracking superiorly along the mesentery. 2. Scattered diverticulosis along the distal descending and sigmoid colon. 3. Aneurysmal dilatation of the distal thoracic and proximal abdominal aorta is grossly stable, measuring up to 4.8 cm just above the diaphragm, and 3.7 cm along the proximal abdominal aorta. Aneurysmal dilatation resolves just distal to the origin of the inferior mesenteric artery. Scattered calcification along the abdominal aorta. 4. Cholelithiasis; gallbladder otherwise unremarkable in appearance. 5. Right renal cyst noted. 6. Mild degenerative change along the lower lumbar spine.  These results were called by telephone at the time of interpretation on 06/29/2014 at 12:25 am to  Dr. Ernestina Patches, who verbally acknowledged these results.   Electronically Signed   By: Garald Balding M.D.   On: 06/29/2014 00:34   Dg Chest 2 View  06/28/2014   CLINICAL DATA:  Fever.  COPD.  EXAM: CHEST  2 VIEW  COMPARISON:  03/27/2014  FINDINGS: Mild cardiomegaly remains stable. No evidence of pulmonary infiltrate or edema. No evidence of pleural effusion. Mild scarring again seen in left lung base. Previous median sternotomy again noted.  IMPRESSION: Stable mild cardiomegaly and left basilar scarring. No active disease.   Electronically Signed   By: Earle Gell M.D.   On: 06/28/2014 20:58   US Abdomen Limited Ruq  06/28/2014   CLINICAL DATA:  Right upper quadrant pain history of gallstones  EXAM: US ABDOMEN LIMITED - RIGHT UPPER QUADRANT  COMPARISON:  None.  FINDINGS: Gallbladder:  Cholelithiasis. There is wall thickening to 4 mm but this is likely from underdistention of the gallbladder. No focal tenderness.  Common bile duct:  Diameter: 4 mm  Liver:  No focal lesion identified. Within normal limits in parenchymal echogenicity.  Partly visible cyst from the right kidney measuring approximately 3 cm.  IMPRESSION: Cholelithiasis without acute cholecystitis.   Electronically Signed   By: Monte Fantasia M.D.   On: 06/28/2014 23:33    Scheduled Meds: . aspirin EC  81 mg Oral Daily  . atorvastatin  20 mg Oral q1800  . ciprofloxacin  400 mg Intravenous Q12H  . donepezil  10 mg Oral QHS  . enoxaparin (LOVENOX) injection  30 mg Subcutaneous Q24H  . levothyroxine  112 mcg Oral QAC breakfast  . memantine  28 mg Oral Daily  . metoprolol  25 mg Oral BID  . metronidazole  500 mg Intravenous Q8H  . mometasone-formoterol  2 puff Inhalation BID  . saccharomyces boulardii  250 mg Oral BID  . sertraline  50 mg Oral Daily   Continuous Infusions: . dextrose 5 % and 0.45% NaCl 75 mL/hr at 06/30/14 0745     FELIZ Marguarite Arbour  [facility] Pager [phone]. If 7PM-7AM, please contact  night-coverage at [email or web address], password [facility] 06/30/2014, 10:33 AM  LOS: 1 day

## 2014-07-01 LAB — BASIC METABOLIC PANEL
ANION GAP: 7 (ref 5–15)
BUN: 6 mg/dL (ref 6–23)
CALCIUM: 8.1 mg/dL — AB (ref 8.4–10.5)
CO2: 24 mmol/L (ref 19–32)
Chloride: 106 mmol/L (ref 96–112)
Creatinine, Ser: 1.13 mg/dL — ABNORMAL HIGH (ref 0.50–1.10)
GFR calc Af Amer: 52 mL/min — ABNORMAL LOW (ref >90)
GFR calc non Af Amer: 45 mL/min — ABNORMAL LOW (ref >90)
Glucose, Bld: 107 mg/dL — ABNORMAL HIGH (ref 70–99)
Potassium: 3.5 mmol/L (ref 3.5–5.1)
Sodium: 137 mmol/L (ref 135–145)

## 2014-07-01 LAB — CBC
HCT: 33.7 % — ABNORMAL LOW (ref 36.0–46.0)
Hemoglobin: 10.4 g/dL — ABNORMAL LOW (ref 12.0–15.0)
MCH: 28 pg (ref 26.0–34.0)
MCHC: 30.9 g/dL (ref 30.0–36.0)
MCV: 90.6 fL (ref 78.0–100.0)
Platelets: 244 10*3/uL (ref 150–400)
RBC: 3.72 MIL/uL — AB (ref 3.87–5.11)
RDW: 16.1 % — ABNORMAL HIGH (ref 11.5–15.5)
WBC: 15.2 10*3/uL — AB (ref 4.0–10.5)

## 2014-07-01 NOTE — Progress Notes (Signed)
Patient ID: Sarah Johnson, female   DOB: 1932/12/21, 81 y.o.   MRN: [id number]    Subjective: Pt feels well today.  No pain.  Still having some diarrhea  Objective: Vital signs in last 24 hours: Temp:  [98.2 F (36.8 C)-98.6 F (37 C)] 98.6 F (37 C) (03/07 0519) Pulse Rate:  [59-68] 63 (03/07 0519) Resp:  [18-20] 20 (03/07 0519) BP: (124-134)/(48-60) 130/48 mmHg (03/07 0519) SpO2:  [98 %-100 %] 100 % (03/07 0904) Last BM Date: 07/01/14  Intake/Output from previous day:   Intake/Output this shift:    PE: Abd: soft, NT, ND, +BS Heart: regular  Lab Results:   Recent Labs  06/29/14 0355 07/01/14 0820  WBC 17.9* 15.2*  HGB 10.7* 10.4*  HCT 33.9* 33.7*  PLT 232 244   BMET  Recent Labs  06/29/14 0355 07/01/14 0550  NA 135 137  K 3.7 3.5  CL 107 106  CO2 22 24  GLUCOSE 102* 107*  BUN 15 6  CREATININE 1.20* 1.13*  CALCIUM 8.1* 8.1*   PT/INR No results for input(s): LABPROT, INR in the last 72 hours. CMP     Component Value Date/Time   NA 137 07/01/2014 0550   K 3.5 07/01/2014 0550   CL 106 07/01/2014 0550   CO2 24 07/01/2014 0550   GLUCOSE 107* 07/01/2014 0550   BUN 6 07/01/2014 0550   CREATININE 1.13* 07/01/2014 0550   CALCIUM 8.1* 07/01/2014 0550   PROT 6.0 06/29/2014 0355   ALBUMIN 3.0* 06/29/2014 0355   AST 20 06/29/2014 0355   ALT 12 06/29/2014 0355   ALKPHOS 55 06/29/2014 0355   BILITOT 0.8 06/29/2014 0355   GFRNONAA 45* 07/01/2014 0550   GFRAA 52* 07/01/2014 0550   Lipase     Component Value Date/Time   LIPASE 27 06/28/2014 2218       Studies/Results: No results found.  Anti-infectives: Anti-infectives    Start     Dose/Rate Route Frequency Ordered Stop   06/29/14 1400  metroNIDAZOLE (FLAGYL) IVPB 500 mg     500 mg 100 mL/hr over 60 Minutes Intravenous Every 8 hours 06/29/14 0314     06/29/14 1200  ciprofloxacin (CIPRO) IVPB 400 mg     400 mg 200 mL/hr over 60 Minutes Intravenous Every 12 hours 06/29/14 0314     06/29/14  0045  ciprofloxacin (CIPRO) IVPB 400 mg     400 mg 200 mL/hr over 60 Minutes Intravenous  Once 06/29/14 0030 06/29/14 0207   06/29/14 0045  metroNIDAZOLE (FLAGYL) IVPB 500 mg     500 mg 100 mL/hr over 60 Minutes Intravenous  Once 06/29/14 0030 06/29/14 0402       Assessment/Plan  1. Diverticulitis with microperforation -cont IV abx therapy -will give clear liquids today 2. C diff colitis -per medicine   LOS: 2 days    Rhianon Zabawa E 07/01/2014, 9:22 AM Pager: [phone]

## 2014-07-01 NOTE — Progress Notes (Signed)
[facility] PROGRESS NOTE  Assessment/Plan: Diverticulitis of colon with perforation - Cont  on cipro and flagyl IV. She seems to be improving. - Unlikely a UTI. - Advanceing to clear, appreciate surgery assistance.  History of Clostridium difficile colitis - C. Dif PCR positive. - she is currently on flagyl.  AKI on  CKD (chronic kidney disease) - Cont Iv fluid hydration., most likelt pre-renal etiology. - Resolved with hydration, KVO IV fluids.  Essential hypertension -stable, cont metoprolol.    Code Status: full Family Communication: noen  Disposition Plan: inpatient   Consultants:  surgery  Procedures:  CT ab and pelvis  Antibiotics:  Cipro and flagyl  HPI/Subjective: No complains feels better. Does not like the food. Objective: Filed Vitals:   06/30/14 1931 06/30/14 2109 07/01/14 0519 07/01/14 0904  BP:  134/52 130/48   Pulse:  59 63   Temp:  98.2 F (36.8 C) 98.6 F (37 C)   TempSrc:  Oral Oral   Resp:  20 20   Height:      Weight:      SpO2: 98% 100% 100% 100%   No intake or output data in the 24 hours ending 07/01/14 1215 Filed Weights   06/28/14 1936  Weight: 59.421 kg (131 lb)    Exam:  General: Alert, awake, oriented x3, in no acute distress.  HEENT: No bruits, no goiter.  Heart: Regular rate and rhythm. Lungs: Good air movement, clear Abdomen: Soft, nontender. Neuro: Grossly intact, nonfocal.   Data Reviewed: Basic Metabolic Panel:  Recent Labs Lab 06/28/14 1821 06/29/14 0355 07/01/14 0550  NA 137 135 137  K 3.6 3.7 3.5  CL 107 107 106  CO2 24 22 24   GLUCOSE 108* 102* 107*  BUN 19 15 6   CREATININE 1.51* 1.20* 1.13*  CALCIUM 8.8 8.1* 8.1*  MG  --  1.5  --   PHOS  --  3.0  --    Liver Function Tests:  Recent Labs Lab 06/28/14 2218 06/29/14 0355  AST 23 20  ALT 13 12  ALKPHOS 58 55  BILITOT 0.4 0.8  PROT 6.6 6.0  ALBUMIN 3.3* 3.0*    Recent Labs Lab 06/28/14 1821 06/28/14 2218  LIPASE 31  27   No results for input(s): AMMONIA in the last 168 hours. CBC:  Recent Labs Lab 06/28/14 1821 06/29/14 0355 07/01/14 0820  WBC 19.7* 17.9* 15.2*  HGB 11.5* 10.7* 10.4*  HCT 37.2 33.9* 33.7*  MCV 90.5 90.4 90.6  PLT 179 232 244   Cardiac Enzymes: No results for input(s): CKTOTAL, CKMB, CKMBINDEX, TROPONINI in the last 168 hours. BNP (last 3 results) No results for input(s): BNP in the last 8760 hours.  ProBNP (last 3 results) No results for input(s): PROBNP in the last 8760 hours.  CBG: No results for input(s): GLUCAP in the last 168 hours.  Recent Results (from the past 240 hour(s))  Urine culture     Status: None   Collection Time: 06/28/14  9:24 PM  Result Value Ref Range Status   Specimen Description URINE, CLEAN CATCH  Final   Special Requests NONE  Final   Colony Count   Final    50,000 COLONIES/ML Performed at [facility]    Culture   Final    Multiple bacterial morphotypes present, none predominant. Suggest appropriate recollection if clinically indicated. Performed at [facility]    Report Status 06/30/2014 FINAL  Final  Culture, blood (routine x 2)     Status:  None (Preliminary result)   Collection Time: 06/29/14  3:50 AM  Result Value Ref Range Status   Specimen Description BLOOD LEFT ARM  Final   Special Requests BOTTLES DRAWN AEROBIC ONLY 10CC  Final   Culture   Final           BLOOD CULTURE RECEIVED NO GROWTH TO DATE CULTURE WILL BE HELD FOR 5 DAYS BEFORE ISSUING A FINAL NEGATIVE REPORT Note: Culture results may be compromised due to an excessive volume of blood received in culture bottles. Performed at [facility]    Report Status PENDING  Incomplete  Culture, blood (routine x 2)     Status: None (Preliminary result)   Collection Time: 06/29/14  3:55 AM  Result Value Ref Range Status   Specimen Description BLOOD LEFT HAND  Final   Special Requests BOTTLES DRAWN AEROBIC ONLY 10CC  Final   Culture   Final            BLOOD CULTURE RECEIVED NO GROWTH TO DATE CULTURE WILL BE HELD FOR 5 DAYS BEFORE ISSUING A FINAL NEGATIVE REPORT Performed at [facility]    Report Status PENDING  Incomplete  Clostridium Difficile by PCR     Status: Abnormal   Collection Time: 06/29/14 10:51 AM  Result Value Ref Range Status   C difficile by pcr POSITIVE (A) NEGATIVE Final    Comment: CRITICAL RESULT CALLED TO, READ BACK BY AND VERIFIED WITH: WILLARD,W AT 12:25PM ON 06/29/14 BY FESTERMAN,C      Studies: No results found.  Scheduled Meds: . aspirin EC  81 mg Oral Daily  . atorvastatin  20 mg Oral q1800  . ciprofloxacin  400 mg Intravenous Q12H  . donepezil  10 mg Oral QHS  . enoxaparin (LOVENOX) injection  30 mg Subcutaneous Q24H  . levothyroxine  112 mcg Oral QAC breakfast  . memantine  28 mg Oral Daily  . metoprolol  25 mg Oral BID  . metronidazole  500 mg Intravenous Q8H  . mometasone-formoterol  2 puff Inhalation BID  . saccharomyces boulardii  250 mg Oral BID  . sertraline  50 mg Oral Daily   Continuous Infusions: . dextrose 5 % and 0.45% NaCl 75 mL/hr at 06/30/14 2114     Charlynne Cousins  [facility] Pager [phone]. If 7PM-7AM, please contact night-coverage at [email or web address], password [facility] 07/01/2014, 12:15 PM  LOS: 2 days

## 2014-07-02 DIAGNOSIS — K5700 Diverticulitis of small intestine with perforation and abscess without bleeding: Secondary | ICD-10-CM

## 2014-07-02 MED ORDER — METRONIDAZOLE 500 MG PO TABS
500.0000 mg | ORAL_TABLET | Freq: Three times a day (TID) | ORAL | Status: DC
  Administered 2014-07-02 – 2014-07-05 (×9): 500 mg via ORAL
  Filled 2014-07-02 (×12): qty 1

## 2014-07-02 MED ORDER — VANCOMYCIN 50 MG/ML ORAL SOLUTION
125.0000 mg | Freq: Four times a day (QID) | ORAL | Status: DC
  Administered 2014-07-02 – 2014-07-05 (×11): 125 mg via ORAL
  Filled 2014-07-02 (×19): qty 2.5

## 2014-07-02 MED ORDER — CIPROFLOXACIN HCL 500 MG PO TABS
500.0000 mg | ORAL_TABLET | Freq: Two times a day (BID) | ORAL | Status: DC
  Administered 2014-07-02 – 2014-07-05 (×6): 500 mg via ORAL
  Filled 2014-07-02 (×10): qty 1

## 2014-07-02 NOTE — Progress Notes (Addendum)
[facility] PROGRESS NOTE  Assessment/Plan: Diverticulitis of colon with perforation - Change cipro and flagyl to tab.Cont to have watery stool, will add vancomycin. - Advancing diet to full, appreciate surgery assistance. - will need to cont cipro for 2 week, flagyl and vanc for 3 weeks. - will be d/c home when stools more formed.  History of Clostridium difficile colitis/active c.dif: - C. Dif PCR positive. - she is currently on flagyl. - will need flagyl and vancomycin for 1 week after she finishes treatment for cipro.  AKI on  CKD (chronic kidney disease) - KVO Iv fluid. most likely pre-renal etiology. - Resolved with hydration.  Essential hypertension -stable, cont metoprolol.    Code Status: full Family Communication: noen  Disposition Plan: inpatient   Consultants:  surgery  Procedures:  CT ab and pelvis  Antibiotics:  Cipro and flagyl  HPI/Subjective: No complains feels better. Objective: Filed Vitals:   07/01/14 1444 07/01/14 2200 07/02/14 0525 07/02/14 0721  BP: 110/91 128/64 127/61   Pulse: 87 72 63   Temp:  98 F (36.7 C) 98.7 F (37.1 C)   TempSrc: Oral Oral Oral   Resp: 20 20 4 16   Height:      Weight:      SpO2: 93% 98% 98%     Intake/Output Summary (Last 24 hours) at 07/02/14 1146 Last data filed at 07/02/14 1043  Gross per 24 hour  Intake   2340 ml  Output      0 ml  Net   2340 ml   Filed Weights   06/28/14 1936  Weight: 59.421 kg (131 lb)    Exam:  General: Alert, awake, oriented x3, in no acute distress.  HEENT: No bruits, no goiter.  Heart: Regular rate and rhythm. Lungs: Good air movement, clear Abdomen: Soft, nontender. Neuro: Grossly intact, nonfocal.   Data Reviewed: Basic Metabolic Panel:  Recent Labs Lab 06/28/14 1821 06/29/14 0355 07/01/14 0550  NA 137 135 137  K 3.6 3.7 3.5  CL 107 107 106  CO2 24 22 24   GLUCOSE 108* 102* 107*  BUN 19 15 6   CREATININE 1.51* 1.20* 1.13*  CALCIUM 8.8  8.1* 8.1*  MG  --  1.5  --   PHOS  --  3.0  --    Liver Function Tests:  Recent Labs Lab 06/28/14 2218 06/29/14 0355  AST 23 20  ALT 13 12  ALKPHOS 58 55  BILITOT 0.4 0.8  PROT 6.6 6.0  ALBUMIN 3.3* 3.0*    Recent Labs Lab 06/28/14 1821 06/28/14 2218  LIPASE 31 27   No results for input(s): AMMONIA in the last 168 hours. CBC:  Recent Labs Lab 06/28/14 1821 06/29/14 0355 07/01/14 0820  WBC 19.7* 17.9* 15.2*  HGB 11.5* 10.7* 10.4*  HCT 37.2 33.9* 33.7*  MCV 90.5 90.4 90.6  PLT 179 232 244   Cardiac Enzymes: No results for input(s): CKTOTAL, CKMB, CKMBINDEX, TROPONINI in the last 168 hours. BNP (last 3 results) No results for input(s): BNP in the last 8760 hours.  ProBNP (last 3 results) No results for input(s): PROBNP in the last 8760 hours.  CBG: No results for input(s): GLUCAP in the last 168 hours.  Recent Results (from the past 240 hour(s))  Urine culture     Status: None   Collection Time: 06/28/14  9:24 PM  Result Value Ref Range Status   Specimen Description URINE, CLEAN CATCH  Final   Special Requests NONE  Final   Colony Count  Final    50,000 COLONIES/ML Performed at [facility]    Culture   Final    Multiple bacterial morphotypes present, none predominant. Suggest appropriate recollection if clinically indicated. Performed at [facility]    Report Status 06/30/2014 FINAL  Final  Culture, blood (routine x 2)     Status: None (Preliminary result)   Collection Time: 06/29/14  3:50 AM  Result Value Ref Range Status   Specimen Description BLOOD LEFT ARM  Final   Special Requests BOTTLES DRAWN AEROBIC ONLY 10CC  Final   Culture   Final           BLOOD CULTURE RECEIVED NO GROWTH TO DATE CULTURE WILL BE HELD FOR 5 DAYS BEFORE ISSUING A FINAL NEGATIVE REPORT Note: Culture results may be compromised due to an excessive volume of blood received in culture bottles. Performed at [facility]    Report Status PENDING   Incomplete  Culture, blood (routine x 2)     Status: None (Preliminary result)   Collection Time: 06/29/14  3:55 AM  Result Value Ref Range Status   Specimen Description BLOOD LEFT HAND  Final   Special Requests BOTTLES DRAWN AEROBIC ONLY 10CC  Final   Culture   Final           BLOOD CULTURE RECEIVED NO GROWTH TO DATE CULTURE WILL BE HELD FOR 5 DAYS BEFORE ISSUING A FINAL NEGATIVE REPORT Performed at [facility]    Report Status PENDING  Incomplete  Clostridium Difficile by PCR     Status: Abnormal   Collection Time: 06/29/14 10:51 AM  Result Value Ref Range Status   C difficile by pcr POSITIVE (A) NEGATIVE Final    Comment: CRITICAL RESULT CALLED TO, READ BACK BY AND VERIFIED WITH: WILLARD,W AT 12:25PM ON 06/29/14 BY FESTERMAN,C      Studies: No results found.  Scheduled Meds: . aspirin EC  81 mg Oral Daily  . atorvastatin  20 mg Oral q1800  . ciprofloxacin  400 mg Intravenous Q12H  . donepezil  10 mg Oral QHS  . enoxaparin (LOVENOX) injection  30 mg Subcutaneous Q24H  . levothyroxine  112 mcg Oral QAC breakfast  . memantine  28 mg Oral Daily  . metoprolol  25 mg Oral BID  . metronidazole  500 mg Intravenous Q8H  . mometasone-formoterol  2 puff Inhalation BID  . saccharomyces boulardii  250 mg Oral BID  . sertraline  50 mg Oral Daily   Continuous Infusions:     Charlynne Cousins  [facility] Pager [phone]. If 7PM-7AM, please contact night-coverage at [email or web address], password [facility] 07/02/2014, 11:46 AM  LOS: 3 days

## 2014-07-02 NOTE — Progress Notes (Signed)
CARE MANAGEMENT NOTE 07/02/2014  Patient:  Sarah Johnson, Sarah Johnson   Account Number:  [id number]  Date Initiated:  07/02/2014  Documentation initiated by:  Edwyna Shell  Subjective/Objective Assessment:   81 yo female admitted with Diverticulitis of colon with perforation from home     Action/Plan:   discharge planning   Anticipated DC Date:  07/05/2014   Anticipated DC Plan:  [place]  CM consult      Choice offered to / List presented to:             Status of service:   Medicare Important Message given?   (If response is "NO", the following Medicare IM given date fields will be blank) Date Medicare IM given:   Medicare IM given by:   Date Additional Medicare IM given:   Additional Medicare IM given by:    Discharge Disposition:    Per UR Regulation:    If discussed at Long Length of Stay Meetings, dates discussed:    Comments:  07/02/14 Edwyna Shell RN BSN CM [phone] Patient lives at home independently, drives self, and has no DME. She stated that she has 2 daughters that live nearby and they help out as needed. No CM needs at this time, will continue to follow.

## 2014-07-02 NOTE — Progress Notes (Signed)
Patient ID: Sarah Johnson, female   DOB: 10-Dec-1932, 81 y.o.   MRN: [id number]    Subjective: Pt sleeping.  When awoken, states she is not having any pain.  Tolerating clear liquids, but diarrhea did pick back up again last night.  No nausea  Objective: Vital signs in last 24 hours: Temp:  [98 F (36.7 C)-98.7 F (37.1 C)] 98.7 F (37.1 C) (03/08 0525) Pulse Rate:  [63-87] 63 (03/08 0525) Resp:  [4-20] 16 (03/08 0721) BP: (110-128)/(61-91) 127/61 mmHg (03/08 0525) SpO2:  [93 %-100 %] 98 % (03/08 0525) Last BM Date: 07/01/14  Intake/Output from previous day: 03/07 0701 - 03/08 0700 In: 2100 [IV Piggyback:2100] Out: -  Intake/Output this shift:    PE: Abd: soft, minimally tender in LLQ, +BS, ND Heart: regular Lungs: CTAB  Lab Results:   Recent Labs  07/01/14 0820  WBC 15.2*  HGB 10.4*  HCT 33.7*  PLT 244   BMET  Recent Labs  07/01/14 0550  NA 137  K 3.5  CL 106  CO2 24  GLUCOSE 107*  BUN 6  CREATININE 1.13*  CALCIUM 8.1*   PT/INR No results for input(s): LABPROT, INR in the last 72 hours. CMP     Component Value Date/Time   NA 137 07/01/2014 0550   K 3.5 07/01/2014 0550   CL 106 07/01/2014 0550   CO2 24 07/01/2014 0550   GLUCOSE 107* 07/01/2014 0550   BUN 6 07/01/2014 0550   CREATININE 1.13* 07/01/2014 0550   CALCIUM 8.1* 07/01/2014 0550   PROT 6.0 06/29/2014 0355   ALBUMIN 3.0* 06/29/2014 0355   AST 20 06/29/2014 0355   ALT 12 06/29/2014 0355   ALKPHOS 55 06/29/2014 0355   BILITOT 0.8 06/29/2014 0355   GFRNONAA 45* 07/01/2014 0550   GFRAA 52* 07/01/2014 0550   Lipase     Component Value Date/Time   LIPASE 27 06/28/2014 2218       Studies/Results: No results found.  Anti-infectives: Anti-infectives    Start     Dose/Rate Route Frequency Ordered Stop   06/29/14 1400  metroNIDAZOLE (FLAGYL) IVPB 500 mg     500 mg 100 mL/hr over 60 Minutes Intravenous Every 8 hours 06/29/14 0314     06/29/14 1200  ciprofloxacin (CIPRO) IVPB 400  mg     400 mg 200 mL/hr over 60 Minutes Intravenous Every 12 hours 06/29/14 0314     06/29/14 0045  ciprofloxacin (CIPRO) IVPB 400 mg     400 mg 200 mL/hr over 60 Minutes Intravenous  Once 06/29/14 0030 06/29/14 0207   06/29/14 0045  metroNIDAZOLE (FLAGYL) IVPB 500 mg     500 mg 100 mL/hr over 60 Minutes Intravenous  Once 06/29/14 0030 06/29/14 0402       Assessment/Plan  1. Diverticulitis with microperforation -can potentially change IV abx therapy to oral abx therapy.  Oral flagyl will likely help her c diff colitis as well. -will give full liquids today -seems to be improving 2. C diff colitis -per medicine    LOS: 3 days    Preciosa Bundrick E 07/02/2014, 8:48 AM Pager: [phone]

## 2014-07-03 DIAGNOSIS — Z954 Presence of other heart-valve replacement: Secondary | ICD-10-CM

## 2014-07-03 DIAGNOSIS — Z8719 Personal history of other diseases of the digestive system: Secondary | ICD-10-CM

## 2014-07-03 MED ORDER — CHOLESTYRAMINE LIGHT 4 G PO PACK
4.0000 g | PACK | Freq: Once | ORAL | Status: AC
  Administered 2014-07-04: 4 g via ORAL
  Filled 2014-07-03: qty 1

## 2014-07-03 NOTE — Discharge Instructions (Signed)
Diverticulitis Diverticulitis is inflammation or infection of small pouches in your colon that form when you have a condition called diverticulosis. The pouches in your colon are called diverticula. Your colon, or large intestine, is where water is absorbed and stool is formed. Complications of diverticulitis can include:  Bleeding.  Severe infection.  Severe pain.  Perforation of your colon.  Obstruction of your colon. CAUSES  Diverticulitis is caused by bacteria. Diverticulitis happens when stool becomes trapped in diverticula. This allows bacteria to grow in the diverticula, which can lead to inflammation and infection. RISK FACTORS People with diverticulosis are at risk for diverticulitis. Eating a diet that does not include enough fiber from fruits and vegetables may make diverticulitis more likely to develop. SYMPTOMS  Symptoms of diverticulitis may include:  Abdominal pain and tenderness. The pain is normally located on the left side of the abdomen, but may occur in other areas.  Fever and chills.  Bloating.  Cramping.  Nausea.  Vomiting.  Constipation.  Diarrhea.  Blood in your stool. DIAGNOSIS  Your health care provider will ask you about your medical history and do a physical exam. You may need to have tests done because many medical conditions can cause the same symptoms as diverticulitis. Tests may include:  Blood tests.  Urine tests.  Imaging tests of the abdomen, including X-rays and CT scans. When your condition is under control, your health care provider may recommend that you have a colonoscopy. A colonoscopy can show how severe your diverticula are and whether something else is causing your symptoms. TREATMENT  Most cases of diverticulitis are mild and can be treated at home. Treatment may include:  Taking over-the-counter pain medicines.  Following a clear liquid diet.  Taking antibiotic medicines by mouth for 7-10 days. More severe cases may  be treated at a hospital. Treatment may include:  Not eating or drinking.  Taking prescription pain medicine.  Receiving antibiotic medicines through an IV tube.  Receiving fluids and nutrition through an IV tube.  Surgery. HOME CARE INSTRUCTIONS   Follow your health care provider's instructions carefully.  Follow a full liquid diet or other diet as directed by your health care provider. After your symptoms improve, your health care provider may tell you to change your diet. He or she may recommend you eat a high-fiber diet. Fruits and vegetables are good sources of fiber. Fiber makes it easier to pass stool.  Take fiber supplements or probiotics as directed by your health care provider.  Only take medicines as directed by your health care provider.  Keep all your follow-up appointments. SEEK MEDICAL CARE IF:   Your pain does not improve.  You have a hard time eating food.  Your bowel movements do not return to normal. SEEK IMMEDIATE MEDICAL CARE IF:   Your pain becomes worse.  Your symptoms do not get better.  Your symptoms suddenly get worse.  You have a fever.  You have repeated vomiting.  You have bloody or black, tarry stools. MAKE SURE YOU:   Understand these instructions.  Will watch your condition.  Will get help right away if you are not doing well or get worse. Document Released: 01/20/2005 Document Revised: 04/17/2013 Document Reviewed: 03/07/2013 [facility] Patient Information 2015 [place], [place]. This information is not intended to replace advice given to you by your health care provider. Make sure you discuss any questions you have with your health care provider.  Low-Fiber Diet x2 weeks Fiber is found in fruits, vegetables, and whole grains.  A low-fiber diet restricts fibrous foods that are not digested in the small intestine. A diet containing about 10-15 grams of fiber per day is considered low fiber. Low-fiber diets may be used to:  Promote  healing and rest the bowel during intestinal flare-ups.  Prevent blockage of a partially obstructed or narrowed gastrointestinal tract.  Reduce fecal weight and volume.  Slow the movement of feces. You may be on a low-fiber diet as a transitional diet following surgery, after an injury (trauma), or because of a short (acute) or lifelong (chronic) illness. Your health care provider will determine the length of time you need to stay on this diet.  WHAT DO I NEED TO KNOW ABOUT A LOW-FIBER DIET? Always check the fiber content on the packaging's Nutrition Facts label, especially on foods from the grains list. Ask your dietitian if you have questions about specific foods that are related to your condition, especially if the food is not listed below. In general, a low-fiber food will have less than 2 g of fiber. WHAT FOODS CAN I EAT? Grains All breads and crackers made with white flour. Sweet rolls, doughnuts, waffles, pancakes, [place] toast, bagels. Pretzels, Melba toast, zwieback. Well-cooked cereals, such as cornmeal, farina, or cream cereals. Dry cereals that do not contain whole grains, fruit, or nuts, such as refined corn, wheat, rice, and oat cereals. Potatoes prepared any way without skins, plain pastas and noodles, refined white rice. Use white flour for baking and making sauces. Use allowed list of grains for casseroles, dumplings, and puddings.  Vegetables Strained tomato and vegetable juices. Fresh lettuce, cucumber, spinach. Well-cooked (no skin or pulp) or canned vegetables, such as asparagus, bean sprouts, beets, carrots, green beans, mushrooms, potatoes, pumpkin, spinach, yellow squash, tomato sauce/puree, turnips, yams, and zucchini. Keep servings limited to  cup.  Fruits All fruit juices except prune juice. Cooked or canned fruits without skin and seeds, such as applesauce, apricots, cherries, fruit cocktail, grapefruit, grapes, mandarin oranges, melons, peaches, pears, pineapple, and  plums. Fresh fruits without skin, such as apricots, avocados, bananas, melons, pineapple, nectarines, and peaches. Keep servings limited to  cup or 1 piece.  Meat and Other Protein Sources Ground or well-cooked tender beef, ham, veal, lamb, pork, or poultry. Eggs, plain cheese. Fish, oysters, shrimp, lobster, and other seafood. Liver, organ meats. Smooth nut butters. Dairy All milk products and alternative dairy substitutes, such as soy, rice, almond, and coconut, not containing added whole nuts, seeds, or added fruit. Beverages Decaf coffee, fruit, and vegetable juices or smoothies (small amounts, with no pulp or skins, and with fruits from allowed list), sports drinks, herbal tea. Condiments Ketchup, mustard, vinegar, cream sauce, cheese sauce, cocoa powder. Spices in moderation, such as allspice, basil, bay leaves, celery powder or leaves, cinnamon, cumin powder, curry powder, ginger, mace, marjoram, onion or garlic powder, oregano, paprika, parsley flakes, ground pepper, rosemary, sage, savory, tarragon, thyme, and turmeric. Sweets and Desserts Plain cakes and cookies, pie made with allowed fruit, pudding, custard, cream pie. Gelatin, fruit, ice, sherbet, frozen ice pops. Ice cream, ice milk without nuts. Plain hard candy, honey, jelly, molasses, syrup, sugar, chocolate syrup, gumdrops, marshmallows. Limit overall sugar intake.  Fats and Oil Margarine, butter, cream, mayonnaise, salad oils, plain salad dressings made from allowed foods. Choose healthy fats such as olive oil, canola oil, and omega-3 fatty acids (such as found in salmon or tuna) when possible.  Other Bouillon, broth, or cream soups made from allowed foods. Any strained soup. Casseroles or mixed dishes  made with allowed foods. The items listed above may not be a complete list of recommended foods or beverages. Contact your dietitian for more options.  WHAT FOODS ARE NOT RECOMMENDED? Grains All whole wheat and whole grain breads  and crackers. Multigrains, rye, bran seeds, nuts, or coconut. Cereals containing whole grains, multigrains, bran, coconut, nuts, raisins. Cooked or dry oatmeal, steel-cut oats. Coarse wheat cereals, granola. Cereals advertised as high fiber. Potato skins. Whole grain pasta, wild or brown rice. Popcorn. Coconut flour. Bran, buckwheat, corn bread, multigrains, rye, wheat germ.  Vegetables Fresh, cooked or canned vegetables, such as artichokes, asparagus, beet greens, broccoli, Brussels sprouts, cabbage, celery, cauliflower, corn, eggplant, kale, legumes or beans, okra, peas, and tomatoes. Avoid large servings of any vegetables, especially raw vegetables.  Fruits Fresh fruits, such as apples with or without skin, berries, cherries, figs, grapes, grapefruit, guavas, kiwis, mangoes, oranges, papayas, pears, persimmons, pineapple, and pomegranate. Prune juice and juices with pulp, stewed or dried prunes. Dried fruits, dates, raisins. Fruit seeds or skins. Avoid large servings of all fresh fruits. Meats and Other Protein Sources Tough, fibrous meats with gristle. Chunky nut butter. Cheese made with seeds, nuts, or other foods not recommended. Nuts, seeds, legumes (beans, including baked beans), dried peas, beans, lentils.  Dairy Yogurt or cheese that contains nuts, seeds, or added fruit.  Beverages Fruit juices with high pulp, prune juice. Caffeinated coffee and teas.  Condiments Coconut, maple syrup, pickles, olives. Sweets and Desserts Desserts, cookies, or candies that contain nuts or coconut, chunky peanut butter, dried fruits. Jams, preserves with seeds, marmalade. Large amounts of sugar and sweets. Any other dessert made with fruits from the not recommended list.  Other Soups made from vegetables that are not recommended or that contain other foods not recommended.  The items listed above may not be a complete list of foods and beverages to avoid. Contact your dietitian for more  information. Document Released: 10/02/2001 Document Revised: 04/17/2013 Document Reviewed: 03/05/2013 [facility] Patient Information 2015 [place], [place]. This information is not intended to replace advice given to you by your health care provider. Make sure you discuss any questions you have with your health care provider.  High-Fiber Diet after the 2 weeks of low fiber diet Fiber is found in fruits, vegetables, and grains. A high-fiber diet encourages the addition of more whole grains, legumes, fruits, and vegetables in your diet. The recommended amount of fiber for adult males is 38 g per day. For adult females, it is 25 g per day. Pregnant and lactating women should get 28 g of fiber per day. If you have a digestive or bowel problem, ask your caregiver for advice before adding high-fiber foods to your diet. Eat a variety of high-fiber foods instead of only a select few type of foods.  PURPOSE  To increase stool bulk.  To make bowel movements more regular to prevent constipation.  To lower cholesterol.  To prevent overeating. WHEN IS THIS DIET USED?  It may be used if you have constipation and hemorrhoids.  It may be used if you have uncomplicated diverticulosis (intestine condition) and irritable bowel syndrome.  It may be used if you need help with weight management.  It may be used if you want to add it to your diet as a protective measure against atherosclerosis, diabetes, and cancer. SOURCES OF FIBER  Whole-grain breads and cereals.  Fruits, such as apples, oranges, bananas, berries, prunes, and pears.  Vegetables, such as green peas, carrots, sweet potatoes, beets, broccoli, cabbage,  spinach, and artichokes.  Legumes, such split peas, soy, lentils.  Almonds. FIBER CONTENT IN FOODS Starches and Grains / Dietary Fiber (g)  Cheerios, 1 cup / 3 g  Corn Flakes cereal, 1 cup / 0.7 g  Rice crispy treat cereal, 1 cup / 0.3 g  Instant oatmeal (cooked),  cup / 2  g  Frosted wheat cereal, 1 cup / 5.1 g  Brown, long-grain rice (cooked), 1 cup / 3.5 g  White, long-grain rice (cooked), 1 cup / 0.6 g  Enriched macaroni (cooked), 1 cup / 2.5 g Legumes / Dietary Fiber (g)  Baked beans (canned, plain, or vegetarian),  cup / 5.2 g  Kidney beans (canned),  cup / 6.8 g  Pinto beans (cooked),  cup / 5.5 g Breads and Crackers / Dietary Fiber (g)  Plain or honey graham crackers, 2 squares / 0.7 g  Saltine crackers, 3 squares / 0.3 g  Plain, salted pretzels, 10 pieces / 1.8 g  Whole-wheat bread, 1 slice / 1.9 g  White bread, 1 slice / 0.7 g  Raisin bread, 1 slice / 1.2 g  Plain bagel, 3 oz / 2 g  Flour tortilla, 1 oz / 0.9 g  Corn tortilla, 1 small / 1.5 g  Hamburger or hotdog bun, 1 small / 0.9 g Fruits / Dietary Fiber (g)  Apple with skin, 1 medium / 4.4 g  Sweetened applesauce,  cup / 1.5 g  Banana,  medium / 1.5 g  Grapes, 10 grapes / 0.4 g  Orange, 1 small / 2.3 g  Raisin, 1.5 oz / 1.6 g  Melon, 1 cup / 1.4 g Vegetables / Dietary Fiber (g)  Green beans (canned),  cup / 1.3 g  Carrots (cooked),  cup / 2.3 g  Broccoli (cooked),  cup / 2.8 g  Peas (cooked),  cup / 4.4 g  Mashed potatoes,  cup / 1.6 g  Lettuce, 1 cup / 0.5 g  Corn (canned),  cup / 1.6 g  Tomato,  cup / 1.1 g Document Released: 04/12/2005 Document Revised: 10/12/2011 Document Reviewed: 07/15/2011 [employer] Patient Information 2015 [place], [place]. This information is not intended to replace advice given to you by your health care provider. Make sure you discuss any questions you have with your health care provider.  Clostridium Difficile Infection Clostridium difficile (C. difficile) is a bacteria found in the intestinal tract or colon. Under certain conditions, it causes diarrhea and sometimes severe disease. The severe form of the disease is known as pseudomembranous colitis (often called C. difficile colitis). This disease can damage the  lining of the colon or cause the colon to become enlarged (toxic megacolon). CAUSES Your colon normally contains many different bacteria, including C. difficile. The balance of bacteria in your colon can change during illness. This is especially true when you take antibiotic medicine. Taking antibiotics may allow the C. difficile to grow, multiply excessively, and make a toxin that then causes illness. The elderly and people with certain medical conditions have a greater risk of getting C. difficile infections. SYMPTOMS  Watery diarrhea.  Fever.  Fatigue.  Loss of appetite.  Nausea.  Abdominal swelling, pain, or tenderness.  Dehydration. DIAGNOSIS Your symptoms may make your caregiver suspect a C. difficile infection, especially if you have used antibiotics in the preceding weeks. However, there are only 2 ways to know for certain whether you have a C. difficile infection:  A lab test that finds the toxin in your stool.  The specific appearance  of an abnormality (pseudomembrane) in your colon. This can only be seen by doing a sigmoidoscopy or colonoscopy. These procedures involve passing an instrument through your rectum to look at the inside of your colon. Your caregiver will help determine if these tests are necessary. TREATMENT  Most people are successfully treated with one of two specific antibiotics, usually given by mouth. Other antibiotics you are receiving are stopped if possible.  Intravenous (IV) fluids and correction of electrolyte imbalance may be necessary.  Rarely, surgery may be needed to remove the infected part of the intestines.  Careful hand washing by you and your caregivers is important to prevent the spread of infection. In the hospital, your caregivers may also put on gowns and gloves to prevent the spread of the C. difficile bacteria. Your room is also cleaned regularly with a solution containing bleach or a product that is known to kill C. difficile. HOME  CARE INSTRUCTIONS  Drink enough fluids to keep your urine clear or pale yellow. Avoid milk, caffeine, and alcohol.  Ask your caregiver for specific rehydration instructions.  Try eating small, frequent meals rather than large meals.  Take your antibiotics as directed. Finish them even if you start to feel better.  Do not use medicines to slow diarrhea. This could delay healing or cause complications.  Wash your hands thoroughly after using the bathroom and before preparing food.  Make sure people who live with you wash their hands often, too.  Carefully disinfect all surfaces with a product that contains chlorine bleach. SEEK MEDICAL CARE IF:  Diarrhea persists longer than expected or recurs after completing your course of antibiotic treatment for the C. difficile infection.  You have trouble staying hydrated. SEEK IMMEDIATE MEDICAL CARE IF:  You develop a new fever.  You have increasing abdominal pain or tenderness.  There is blood in your stools, or your stools are dark black and tarry.  You cannot hold down food or liquids. MAKE SURE YOU:  Understand these instructions.  Will watch your condition.  Will get help right away if you are not doing well or get worse. Document Released: 01/20/2005 Document Revised: 08/27/2013 Document Reviewed: 09/18/2010 [facility] Patient Information 2015 [place], [place]. This information is not intended to replace advice given to you by your health care provider. Make sure you discuss any questions you have with your health care provider.

## 2014-07-03 NOTE — Progress Notes (Addendum)
Patients daughter concerned about possibility of discharge tomorrow.  States that patient had large loose stool, and doesn't think that patient is ready for discharge.  Also, states that patient has history of memory impairment, and will not remember how many loose stools she has had.

## 2014-07-03 NOTE — Progress Notes (Signed)
Patient ID: Sarah Johnson  female  OZD:[id number]    DOB: 1932-11-20    DOA: 06/28/2014  PCP: Tommy Medal, MD   Brief history of present illness   Patient is a 81 year old female with emphysema, thoracic aortic aneurysm repair in 1/10, pulmonary embolism, 2010, hypothyroidism, hyperlipidemia, chronic kidney disease, hypertension, GERD, prior history of diverticulitis resulting in Escherichia coli sepsis in 2014. Patient had developed C. difficile and completed course with improvement in November. On the day of admission, presented with abdominal pain, fever 101.4, diarrhea that started a day before. Patient had a tooth extraction and had just been placed on erythromycin. On Arrival to ER patient met sepsis criteria given elevated WBC up to 19.7 fever up top 100.8, BP soft 110/46 initially but now improved.  CT of the abdomen showed Acute diverticulitis along the distal sigmoid colon, with diffuse soft tissue inflammation and trace free fluid. Small perforation noted, with two small foci of air seen tracking superiorly along the mesentery. Cholelithiasis without evidence of cholecystitis noted on [place].  Assessment/Plan: Principal Problem:  Acute Diverticulitis of colon with perforation - Currently on ciprofloxacin, Flagyl. Patient was still having some watery stool on 3/8 and oral vancomycin was added. Since that diarrhea is improving, only do one BM today.  - Gen. surgery following. Patient will be to continue oral ciprofloxacin total of 10 days of antibiotics therapy and 14 days of Flagyl due to C. difficile colitis. We'll also discuss with infectious disease prior to discharge. - She will need colonoscopy in 6-8 weeks and follow-up with PCP and GI referral.  Active Problems:  C. difficile colitis  - currently diarrhea improving, placed on oral vancomycin yesterday     Essential hypertension - Currently BP stable     COPD with chronic bronchitis - Currently stable, no wheezing    S/P aortic valve replacement    CKD (chronic kidney disease) stage III - Creatinine function improving, 1.5 at the time of admission, now 1.1   hypothyroidism - Continue Synthroid   DVT Prophylaxis: Lovenox   Code Status: full CODE STATUS   Family Communication: Discussed in detail with the patient, all imaging results, lab results explained to the patient    Disposition: hopefully DC home tomorrow  Consultants:  surgery  Procedures:  none  Antibiotics:  Cipro  Flagyl  vanc oral  Subjective: the patient seen and examined earlier this morning, around 11 AM, diarrhea improving, only one BM today, per patient mucousy. No fevers or chills or abdominal pain.  Objective: Weight change:  No intake or output data in the 24 hours ending 07/03/14 1314 Blood pressure 124/45, pulse 63, temperature 98.2 F (36.8 C), temperature source Oral, resp. rate 20, height 5' (1.524 m), weight 59.421 kg (131 lb), SpO2 97 %.  Physical Exam: General: Alert and awake, oriented x3, not in any acute distress. CVS: S1-S2 clear, no murmur rubs or gallops Chest: clear to auscultation bilaterally, no wheezing, rales or rhonchi Abdomen: soft nontender, nondistended, normal bowel sounds  Extremities: no cyanosis, clubbing or edema noted bilaterally Neuro: Cranial nerves II-XII intact, no focal neurological deficits  Lab Results: Basic Metabolic Panel:  Recent Labs Lab 06/29/14 0355 07/01/14 0550  NA 135 137  K 3.7 3.5  CL 107 106  CO2 22 24  GLUCOSE 102* 107*  BUN 15 6  CREATININE 1.20* 1.13*  CALCIUM 8.1* 8.1*  MG 1.5  --   PHOS 3.0  --    Liver Function Tests:  Recent Labs Lab 06/28/14  2218 06/29/14 0355  AST 23 20  ALT 13 12  ALKPHOS 58 55  BILITOT 0.4 0.8  PROT 6.6 6.0  ALBUMIN 3.3* 3.0*    Recent Labs Lab 06/28/14 1821 06/28/14 2218  LIPASE 31 27   No results for input(s): AMMONIA in the last 168 hours. CBC:  Recent Labs Lab 06/29/14 0355 07/01/14 0820   WBC 17.9* 15.2*  HGB 10.7* 10.4*  HCT 33.9* 33.7*  MCV 90.4 90.6  PLT 232 244   Cardiac Enzymes: No results for input(s): CKTOTAL, CKMB, CKMBINDEX, TROPONINI in the last 168 hours. BNP: Invalid input(s): POCBNP CBG: No results for input(s): GLUCAP in the last 168 hours.   Micro Results: Recent Results (from the past 240 hour(s))  Urine culture     Status: None   Collection Time: 06/28/14  9:24 PM  Result Value Ref Range Status   Specimen Description URINE, CLEAN CATCH  Final   Special Requests NONE  Final   Colony Count   Final    50,000 COLONIES/ML Performed at [facility]    Culture   Final    Multiple bacterial morphotypes present, none predominant. Suggest appropriate recollection if clinically indicated. Performed at [facility]    Report Status 06/30/2014 FINAL  Final  Culture, blood (routine x 2)     Status: None (Preliminary result)   Collection Time: 06/29/14  3:50 AM  Result Value Ref Range Status   Specimen Description BLOOD LEFT ARM  Final   Special Requests BOTTLES DRAWN AEROBIC ONLY 10CC  Final   Culture   Final           BLOOD CULTURE RECEIVED NO GROWTH TO DATE CULTURE WILL BE HELD FOR 5 DAYS BEFORE ISSUING A FINAL NEGATIVE REPORT Note: Culture results may be compromised due to an excessive volume of blood received in culture bottles. Performed at [facility]    Report Status PENDING  Incomplete  Culture, blood (routine x 2)     Status: None (Preliminary result)   Collection Time: 06/29/14  3:55 AM  Result Value Ref Range Status   Specimen Description BLOOD LEFT HAND  Final   Special Requests BOTTLES DRAWN AEROBIC ONLY 10CC  Final   Culture   Final           BLOOD CULTURE RECEIVED NO GROWTH TO DATE CULTURE WILL BE HELD FOR 5 DAYS BEFORE ISSUING A FINAL NEGATIVE REPORT Performed at [facility]    Report Status PENDING  Incomplete  Clostridium Difficile by PCR     Status: Abnormal   Collection Time: 06/29/14 10:51  AM  Result Value Ref Range Status   C difficile by pcr POSITIVE (A) NEGATIVE Final    Comment: CRITICAL RESULT CALLED TO, READ BACK BY AND VERIFIED WITH: WILLARD,W AT 12:25PM ON 06/29/14 BY FESTERMAN,C     Studies/Results: Ct Abdomen Pelvis Wo Contrast  06/29/2014   CLINICAL DATA:  Acute onset of fever. Not feeling well. Diarrhea. Leukocytosis. Initial encounter.  EXAM: CT ABDOMEN AND PELVIS WITHOUT CONTRAST  TECHNIQUE: Multidetector CT imaging of the abdomen and pelvis was performed following the standard protocol without IV contrast.  COMPARISON:  CT of the abdomen and pelvis from 03/25/2014  FINDINGS: Minimal left basilar atelectasis is noted.  The liver is unremarkable in appearance. The spleen is absent. Stones are noted dependently within the gallbladder; the gallbladder is otherwise unremarkable. The pancreas and adrenal glands are unremarkable.  A 3.7 cm cyst is noted at the upper pole  of the right kidney. Mild nonspecific perinephric stranding is noted on the left. The kidneys are otherwise unremarkable. No hydronephrosis is seen. No renal or ureteral stones are identified.  There is herniation of a short segment of distal ileum into a small right inguinal hernia, without evidence for obstruction. The visualized small bowel is otherwise unremarkable. The stomach is within normal limits. No acute vascular abnormalities are seen.  There is aneurysmal dilatation of the distal thoracic and proximal abdominal aorta, measuring up to 4.8 cm just above the diaphragm, and 3.7 cm along the proximal abdominal aorta. Aneurysmal dilatation resolves just distal to the origin of the inferior mesenteric artery. Scattered calcification is seen along the abdominal aorta.  The celiac trunk, superior mesenteric artery and bilateral renal arteries are grossly unremarkable.  The appendix is grossly unremarkable in appearance; there is no evidence for appendicitis.  There is acute diverticulitis along the distal sigmoid  colon, with diffuse soft tissue inflammation and trace free fluid. Two small foci of air are seen tracking superiorly along the mesentery, concerning for a small perforation.  Scattered diverticulosis is noted along the distal descending and sigmoid colon. Contrast progresses to the level of the rectum. The colon is otherwise unremarkable.  The bladder is mildly distended and grossly unremarkable. The uterus is unremarkable in appearance. The ovaries are grossly symmetric; no suspicious adnexal masses are seen. No inguinal lymphadenopathy is seen.  No acute osseous abnormalities are identified. Vacuum phenomenon is noted at L3-L4 and L4-L5. There is partial sacralization of vertebral body L5. Underlying facet disease is noted. There is grade 1 anterolisthesis of L4 on L5.  IMPRESSION: 1. Acute diverticulitis along the distal sigmoid colon, with diffuse soft tissue inflammation and trace free fluid. Small perforation noted, with two small foci of air seen tracking superiorly along the mesentery. 2. Scattered diverticulosis along the distal descending and sigmoid colon. 3. Aneurysmal dilatation of the distal thoracic and proximal abdominal aorta is grossly stable, measuring up to 4.8 cm just above the diaphragm, and 3.7 cm along the proximal abdominal aorta. Aneurysmal dilatation resolves just distal to the origin of the inferior mesenteric artery. Scattered calcification along the abdominal aorta. 4. Cholelithiasis; gallbladder otherwise unremarkable in appearance. 5. Right renal cyst noted. 6. Mild degenerative change along the lower lumbar spine.  These results were called by telephone at the time of interpretation on 06/29/2014 at 12:25 am to Dr. Ernestina Patches, who verbally acknowledged these results.   Electronically Signed   By: Garald Balding M.D.   On: 06/29/2014 00:34   Dg Chest 2 View  06/28/2014   CLINICAL DATA:  Fever.  COPD.  EXAM: CHEST  2 VIEW  COMPARISON:  03/27/2014  FINDINGS: Mild cardiomegaly  remains stable. No evidence of pulmonary infiltrate or edema. No evidence of pleural effusion. Mild scarring again seen in left lung base. Previous median sternotomy again noted.  IMPRESSION: Stable mild cardiomegaly and left basilar scarring. No active disease.   Electronically Signed   By: Earle Gell M.D.   On: 06/28/2014 20:58   US Abdomen Limited Ruq  06/28/2014   CLINICAL DATA:  Right upper quadrant pain history of gallstones  EXAM: US ABDOMEN LIMITED - RIGHT UPPER QUADRANT  COMPARISON:  None.  FINDINGS: Gallbladder:  Cholelithiasis. There is wall thickening to 4 mm but this is likely from underdistention of the gallbladder. No focal tenderness.  Common bile duct:  Diameter: 4 mm  Liver:  No focal lesion identified. Within normal limits in parenchymal echogenicity.  Partly visible cyst from the right kidney measuring approximately 3 cm.  IMPRESSION: Cholelithiasis without acute cholecystitis.   Electronically Signed   By: Monte Fantasia M.D.   On: 06/28/2014 23:33    Medications: Scheduled Meds: . aspirin EC  81 mg Oral Daily  . atorvastatin  20 mg Oral q1800  . ciprofloxacin  500 mg Oral BID  . donepezil  10 mg Oral QHS  . enoxaparin (LOVENOX) injection  30 mg Subcutaneous Q24H  . levothyroxine  112 mcg Oral QAC breakfast  . memantine  28 mg Oral Daily  . metoprolol  25 mg Oral BID  . metroNIDAZOLE  500 mg Oral 3 times per day  . mometasone-formoterol  2 puff Inhalation BID  . saccharomyces boulardii  250 mg Oral BID  . sertraline  50 mg Oral Daily  . vancomycin  125 mg Oral 4 times per day    time spent 25 minutes    LOS: 4 days   RAI,RIPUDEEP M.D. [facility] 07/03/2014, 1:14 PM Pager: [phone]  If 7PM-7AM, please contact night-coverage [email or web address] Password [email or web address]

## 2014-07-03 NOTE — Progress Notes (Signed)
Patient ID: CHIVONNE RASCON, female   DOB: Oct 11, 1932, 81 y.o.   MRN: [id number]    Subjective: Pt feels well today, except for diarrhea.  Tolerating full liquids well.  No abdominal pain  Objective: Vital signs in last 24 hours: Temp:  [98.2 F (36.8 C)-98.8 F (37.1 C)] 98.2 F (36.8 C) (03/09 0524) Pulse Rate:  [63-70] 63 (03/09 0524) Resp:  [18-20] 20 (03/09 0524) BP: (116-124)/(45-61) 124/45 mmHg (03/09 0524) SpO2:  [92 %-97 %] 97 % (03/09 0524) Last BM Date: 07/02/14  Intake/Output from previous day: 03/08 0701 - 03/09 0700 In: 240 [P.O.:240] Out: -  Intake/Output this shift:    PE: Abd: soft, NT, ND, +BS Heart: regular LungS: CTAB  Lab Results:   Recent Labs  07/01/14 0820  WBC 15.2*  HGB 10.4*  HCT 33.7*  PLT 244   BMET  Recent Labs  07/01/14 0550  NA 137  K 3.5  CL 106  CO2 24  GLUCOSE 107*  BUN 6  CREATININE 1.13*  CALCIUM 8.1*   PT/INR No results for input(s): LABPROT, INR in the last 72 hours. CMP     Component Value Date/Time   NA 137 07/01/2014 0550   K 3.5 07/01/2014 0550   CL 106 07/01/2014 0550   CO2 24 07/01/2014 0550   GLUCOSE 107* 07/01/2014 0550   BUN 6 07/01/2014 0550   CREATININE 1.13* 07/01/2014 0550   CALCIUM 8.1* 07/01/2014 0550   PROT 6.0 06/29/2014 0355   ALBUMIN 3.0* 06/29/2014 0355   AST 20 06/29/2014 0355   ALT 12 06/29/2014 0355   ALKPHOS 55 06/29/2014 0355   BILITOT 0.8 06/29/2014 0355   GFRNONAA 45* 07/01/2014 0550   GFRAA 52* 07/01/2014 0550   Lipase     Component Value Date/Time   LIPASE 27 06/28/2014 2218       Studies/Results: No results found.  Anti-infectives: Anti-infectives    Start     Dose/Rate Route Frequency Ordered Stop   07/02/14 1600  vancomycin (VANCOCIN) 50 mg/mL oral solution 125 mg     125 mg Oral 4 times per day 07/02/14 1336     07/02/14 1400  metroNIDAZOLE (FLAGYL) tablet 500 mg     500 mg Oral 3 times per day 07/02/14 1149     07/02/14 1200  ciprofloxacin (CIPRO)  tablet 500 mg     500 mg Oral 2 times daily 07/02/14 1149     06/29/14 1400  metroNIDAZOLE (FLAGYL) IVPB 500 mg  Status:  Discontinued     500 mg 100 mL/hr over 60 Minutes Intravenous Every 8 hours 06/29/14 0314 07/02/14 1149   06/29/14 1200  ciprofloxacin (CIPRO) IVPB 400 mg  Status:  Discontinued     400 mg 200 mL/hr over 60 Minutes Intravenous Every 12 hours 06/29/14 0314 07/02/14 1149   06/29/14 0045  ciprofloxacin (CIPRO) IVPB 400 mg     400 mg 200 mL/hr over 60 Minutes Intravenous  Once 06/29/14 0030 06/29/14 0207   06/29/14 0045  metroNIDAZOLE (FLAGYL) IVPB 500 mg     500 mg 100 mL/hr over 60 Minutes Intravenous  Once 06/29/14 0030 06/29/14 0402       Assessment/Plan   1. Diverticulitis with microperforation -total of 10 days of abx therapy, + additional flagyl as needed for c diff -will give low fiber diet today.  If she tolerates this, she is surgically stable for dc home from our standpoint when stable medically -she will need a colonoscopy in 6-8 weeks.  Patient  does not think she has ever had a colonoscopy -follow up with PCP 2. C diff colitis -per medicine    LOS: 4 days    Yariana Hoaglund E 07/03/2014, 8:58 AM Pager: [phone]

## 2014-07-04 DIAGNOSIS — K5780 Diverticulitis of intestine, part unspecified, with perforation and abscess without bleeding: Secondary | ICD-10-CM

## 2014-07-04 DIAGNOSIS — J449 Chronic obstructive pulmonary disease, unspecified: Secondary | ICD-10-CM

## 2014-07-04 DIAGNOSIS — N189 Chronic kidney disease, unspecified: Secondary | ICD-10-CM

## 2014-07-04 DIAGNOSIS — A0472 Enterocolitis due to Clostridium difficile, not specified as recurrent: Secondary | ICD-10-CM | POA: Insufficient documentation

## 2014-07-04 LAB — CBC
HCT: 32.8 % — ABNORMAL LOW (ref 36.0–46.0)
HEMOGLOBIN: 10.4 g/dL — AB (ref 12.0–15.0)
MCH: 28.1 pg (ref 26.0–34.0)
MCHC: 31.7 g/dL (ref 30.0–36.0)
MCV: 88.6 fL (ref 78.0–100.0)
Platelets: 250 10*3/uL (ref 150–400)
RBC: 3.7 MIL/uL — AB (ref 3.87–5.11)
RDW: 16 % — ABNORMAL HIGH (ref 11.5–15.5)
WBC: 13.4 10*3/uL — ABNORMAL HIGH (ref 4.0–10.5)

## 2014-07-04 LAB — BASIC METABOLIC PANEL
Anion gap: 8 (ref 5–15)
BUN: 11 mg/dL (ref 6–23)
CO2: 24 mmol/L (ref 19–32)
CREATININE: 1.18 mg/dL — AB (ref 0.50–1.10)
Calcium: 8.9 mg/dL (ref 8.4–10.5)
Chloride: 109 mmol/L (ref 96–112)
GFR calc non Af Amer: 43 mL/min — ABNORMAL LOW (ref >90)
GFR, EST AFRICAN AMERICAN: 49 mL/min — AB (ref >90)
GLUCOSE: 97 mg/dL (ref 70–99)
Potassium: 3.4 mmol/L — ABNORMAL LOW (ref 3.5–5.1)
SODIUM: 141 mmol/L (ref 135–145)

## 2014-07-04 MED ORDER — POTASSIUM CHLORIDE CRYS ER 20 MEQ PO TBCR
40.0000 meq | EXTENDED_RELEASE_TABLET | Freq: Every day | ORAL | Status: DC
  Administered 2014-07-04 – 2014-07-05 (×2): 40 meq via ORAL
  Filled 2014-07-04 (×2): qty 2

## 2014-07-04 MED ORDER — ENOXAPARIN SODIUM 40 MG/0.4ML ~~LOC~~ SOLN
40.0000 mg | SUBCUTANEOUS | Status: DC
  Administered 2014-07-05: 40 mg via SUBCUTANEOUS
  Filled 2014-07-04: qty 0.4

## 2014-07-04 NOTE — Evaluation (Signed)
Physical Therapy Evaluation Patient Details Name: DEVORY MCKINZIE MRN: [id number] DOB: 1932-05-30 Today's Date: 07/04/2014   History of Present Illness  admitted 06/28/14 with abdominal pain and diarrhe, recurrent C Diff, acute colon diverticuli  Clinical Impression  Patient  independent in room. Ambulated in hall without assistance, mildly imbalanced turning and talking.. Patient will benefit from PT to address problems listed in note below. Recommend HHPT safety eval as patient was very independent.    Follow Up Recommendations Home health PT;Supervision/Assistance - 24 hour (may not need bt DC)    Equipment Recommendations  None recommended by PT    Recommendations for Other Services       Precautions / Restrictions Precautions Precautions: Fall Precaution Comments: diarrhea      Mobility  Bed Mobility Overal bed mobility: Independent                Transfers Overall transfer level: Independent                  Ambulation/Gait Ambulation/Gait assistance: Modified independent (Device/Increase time);Supervision Ambulation Distance (Feet): 20 Feet Assistive device: None Gait Pattern/deviations: Step-through pattern     General Gait Details: , mild balance loss during turning head and talking,  maintains balance though  Stairs            Wheelchair Mobility    Modified Rankin (Stroke Patients Only)       Balance Overall balance assessment: Needs assistance         Standing balance support: During functional activity;No upper extremity supported Standing balance-Leahy Scale: Good                               Pertinent Vitals/Pain Pain Assessment: No/denies pain    Home Living Family/patient expects to be discharged to:: Private residence Living Arrangements: Alone Available Help at Discharge: Family;Friend(s) Type of Home: House Home Access: Level entry     Home Layout: One level Home Equipment: None      Prior  Function Level of Independence: Independent         Comments: drives,     Hand Dominance        Extremity/Trunk Assessment   Upper Extremity Assessment: Overall WFL for tasks assessed           Lower Extremity Assessment: Overall WFL for tasks assessed      Cervical / Trunk Assessment: Normal  Communication   Communication: No difficulties  Cognition Arousal/Alertness: Awake/alert Behavior During Therapy: WFL for tasks assessed/performed Overall Cognitive Status: Within Functional Limits for tasks assessed                      General Comments      Exercises        Assessment/Plan    PT Assessment Patient needs continued PT services  PT Diagnosis Generalized weakness   PT Problem List Decreased strength;Decreased activity tolerance;Decreased balance;Decreased mobility;Decreased knowledge of precautions;Decreased safety awareness  PT Treatment Interventions Gait training;Functional mobility training;Therapeutic activities;Therapeutic exercise;Patient/family education   PT Goals (Current goals can be found in the Care Plan section) Acute Rehab PT Goals Patient Stated Goal: to go home tomorrow PT Goal Formulation: With patient Time For Goal Achievement: 07/18/14 Potential to Achieve Goals: Good    Frequency Min 3X/week   Barriers to discharge        Co-evaluation  End of Session   Activity Tolerance: Patient tolerated treatment well Patient left: in bed;with call bell/phone within reach Nurse Communication: Mobility status         Time: 8101-7510 PT Time Calculation (min) (ACUTE ONLY): 13 min   Charges:   PT Evaluation $Initial PT Evaluation Tier I: 1 Procedure     PT G CodesClaretha Cooper 07/04/2014, 3:29 PM Tresa Endo PT [phone]

## 2014-07-04 NOTE — Progress Notes (Signed)
Patient ID: Sarah Johnson  female  ZYS:[id number]    DOB: 04-Jun-1932    DOA: 06/28/2014  PCP: Tommy Medal, MD   Brief history of present illness   Patient is a 81 year old female with emphysema, thoracic aortic aneurysm repair in 1/10, pulmonary embolism, 2010, hypothyroidism, hyperlipidemia, chronic kidney disease, hypertension, GERD, prior history of diverticulitis resulting in Escherichia coli sepsis in 2014. Patient had developed C. difficile and completed course with improvement in November. On the day of admission, presented with abdominal pain, fever 101.4, diarrhea that started a day before. Patient had a tooth extraction and had just been placed on erythromycin. On Arrival to ER patient met sepsis criteria given elevated WBC up to 19.7 fever up top 100.8, BP soft 110/46 initially but now improved.  CT of the abdomen showed Acute diverticulitis along the distal sigmoid colon, with diffuse soft tissue inflammation and trace free fluid. Small perforation noted, with two small foci of air seen tracking superiorly along the mesentery. Cholelithiasis without evidence of cholecystitis noted on [place].  Assessment/Plan: Principal Problem:  Acute Diverticulitis of colon with microperforation - Currently on ciprofloxacin, Flagyl. Patient was still having some watery stool on 3/8 and oral vancomycin was added. Per patient she had only 1 bowel movement this morning, was watery  - Gen. surgery following, recommended ciprofloxacin and Flagyl for total of 10 days. She will need colonoscopy in 6-8 weeks and follow-up with PCP and GI referral.  Active Problems:  C. difficile colitis  - Patient was placed on oral vancomycin on 3/8. Discussed with infectious disease, Dr. Johnnye Sima, recommended ciprofloxacin and Flagyl for a week after discharge and continue oral vancomycin prolonged 6 weeks taper    Essential hypertension - Currently BP stable     COPD with chronic bronchitis - Currently stable, no  wheezing     S/P aortic valve replacement    CKD (chronic kidney disease) stage III - Creatinine function improving, 1.5 at the time of admission, now 1.1   hypothyroidism - Continue Synthroid  Hypokalemia Replaced   DVT Prophylaxis: Lovenox   Code Status: full CODE STATUS   Family Communication: Discussed in detail with the patient, all imaging results, lab results explained to the patient    Disposition: hopefully DC home tomorrow if improving  Consultants:  Surgery  Infectious disease, Dr. Johnnye Sima  Procedures:  none  Antibiotics:  Cipro  Flagyl  vanc oral  Subjective: Patient seen and examined, reported that she did have another watery bowel movement around 5 AM in the morning.No fevers or chills, nausea, vomiting or abdominal pain.  Objective: Weight change:   Intake/Output Summary (Last 24 hours) at 07/04/14 1204 Last data filed at 07/04/14 0900  Gross per 24 hour  Intake    300 ml  Output      0 ml  Net    300 ml   Blood pressure 151/57, pulse 63, temperature 98.8 F (37.1 C), temperature source Oral, resp. rate 20, height 5' (1.524 m), weight 59.421 kg (131 lb), SpO2 91 %.  Physical Exam: General: Alert and awake, oriented x3, not in any acute distress. CVS: S1-S2 clear, no murmur rubs or gallops Chest: clear to auscultation bilaterally, no wheezing, rales or rhonchi Abdomen: soft nontender, nondistended, normal bowel sounds  Extremities: no cyanosis, clubbing or edema noted bilaterally   Lab Results: Basic Metabolic Panel:  Recent Labs Lab 06/29/14 0355 07/01/14 0550 07/04/14 0543  NA 135 137 141  K 3.7 3.5 3.4*  CL 107 106 109  CO2 '22 24 24  '$ GLUCOSE 102* 107* 97  BUN $Re'15 6 11  'Ecu$ CREATININE 1.20* 1.13* 1.18*  CALCIUM 8.1* 8.1* 8.9  MG 1.5  --   --   PHOS 3.0  --   --    Liver Function Tests:  Recent Labs Lab 06/28/14 2218 06/29/14 0355  AST 23 20  ALT 13 12  ALKPHOS 58 55  BILITOT 0.4 0.8  PROT 6.6 6.0  ALBUMIN 3.3*  3.0*    Recent Labs Lab 06/28/14 1821 06/28/14 2218  LIPASE 31 27   No results for input(s): AMMONIA in the last 168 hours. CBC:  Recent Labs Lab 07/01/14 0820 07/04/14 0543  WBC 15.2* 13.4*  HGB 10.4* 10.4*  HCT 33.7* 32.8*  MCV 90.6 88.6  PLT 244 250   Cardiac Enzymes: No results for input(s): CKTOTAL, CKMB, CKMBINDEX, TROPONINI in the last 168 hours. BNP: Invalid input(s): POCBNP CBG: No results for input(s): GLUCAP in the last 168 hours.   Micro Results: Recent Results (from the past 240 hour(s))  Urine culture     Status: None   Collection Time: 06/28/14  9:24 PM  Result Value Ref Range Status   Specimen Description URINE, CLEAN CATCH  Final   Special Requests NONE  Final   Colony Count   Final    50,000 COLONIES/ML Performed at [facility]    Culture   Final    Multiple bacterial morphotypes present, none predominant. Suggest appropriate recollection if clinically indicated. Performed at [facility]    Report Status 06/30/2014 FINAL  Final  Culture, blood (routine x 2)     Status: None (Preliminary result)   Collection Time: 06/29/14  3:50 AM  Result Value Ref Range Status   Specimen Description BLOOD LEFT ARM  Final   Special Requests BOTTLES DRAWN AEROBIC ONLY 10CC  Final   Culture   Final           BLOOD CULTURE RECEIVED NO GROWTH TO DATE CULTURE WILL BE HELD FOR 5 DAYS BEFORE ISSUING A FINAL NEGATIVE REPORT Note: Culture results may be compromised due to an excessive volume of blood received in culture bottles. Performed at [facility]    Report Status PENDING  Incomplete  Culture, blood (routine x 2)     Status: None (Preliminary result)   Collection Time: 06/29/14  3:55 AM  Result Value Ref Range Status   Specimen Description BLOOD LEFT HAND  Final   Special Requests BOTTLES DRAWN AEROBIC ONLY 10CC  Final   Culture   Final           BLOOD CULTURE RECEIVED NO GROWTH TO DATE CULTURE WILL BE HELD FOR 5 DAYS BEFORE  ISSUING A FINAL NEGATIVE REPORT Performed at [facility]    Report Status PENDING  Incomplete  Clostridium Difficile by PCR     Status: Abnormal   Collection Time: 06/29/14 10:51 AM  Result Value Ref Range Status   C difficile by pcr POSITIVE (A) NEGATIVE Final    Comment: CRITICAL RESULT CALLED TO, READ BACK BY AND VERIFIED WITH: WILLARD,W AT 12:25PM ON 06/29/14 BY FESTERMAN,C     Studies/Results: Ct Abdomen Pelvis Wo Contrast  06/29/2014   CLINICAL DATA:  Acute onset of fever. Not feeling well. Diarrhea. Leukocytosis. Initial encounter.  EXAM: CT ABDOMEN AND PELVIS WITHOUT CONTRAST  TECHNIQUE: Multidetector CT imaging of the abdomen and pelvis was performed following the standard protocol without IV contrast.  COMPARISON:  CT of the abdomen and  pelvis from 03/25/2014  FINDINGS: Minimal left basilar atelectasis is noted.  The liver is unremarkable in appearance. The spleen is absent. Stones are noted dependently within the gallbladder; the gallbladder is otherwise unremarkable. The pancreas and adrenal glands are unremarkable.  A 3.7 cm cyst is noted at the upper pole of the right kidney. Mild nonspecific perinephric stranding is noted on the left. The kidneys are otherwise unremarkable. No hydronephrosis is seen. No renal or ureteral stones are identified.  There is herniation of a short segment of distal ileum into a small right inguinal hernia, without evidence for obstruction. The visualized small bowel is otherwise unremarkable. The stomach is within normal limits. No acute vascular abnormalities are seen.  There is aneurysmal dilatation of the distal thoracic and proximal abdominal aorta, measuring up to 4.8 cm just above the diaphragm, and 3.7 cm along the proximal abdominal aorta. Aneurysmal dilatation resolves just distal to the origin of the inferior mesenteric artery. Scattered calcification is seen along the abdominal aorta.  The celiac trunk, superior mesenteric artery and  bilateral renal arteries are grossly unremarkable.  The appendix is grossly unremarkable in appearance; there is no evidence for appendicitis.  There is acute diverticulitis along the distal sigmoid colon, with diffuse soft tissue inflammation and trace free fluid. Two small foci of air are seen tracking superiorly along the mesentery, concerning for a small perforation.  Scattered diverticulosis is noted along the distal descending and sigmoid colon. Contrast progresses to the level of the rectum. The colon is otherwise unremarkable.  The bladder is mildly distended and grossly unremarkable. The uterus is unremarkable in appearance. The ovaries are grossly symmetric; no suspicious adnexal masses are seen. No inguinal lymphadenopathy is seen.  No acute osseous abnormalities are identified. Vacuum phenomenon is noted at L3-L4 and L4-L5. There is partial sacralization of vertebral body L5. Underlying facet disease is noted. There is grade 1 anterolisthesis of L4 on L5.  IMPRESSION: 1. Acute diverticulitis along the distal sigmoid colon, with diffuse soft tissue inflammation and trace free fluid. Small perforation noted, with two small foci of air seen tracking superiorly along the mesentery. 2. Scattered diverticulosis along the distal descending and sigmoid colon. 3. Aneurysmal dilatation of the distal thoracic and proximal abdominal aorta is grossly stable, measuring up to 4.8 cm just above the diaphragm, and 3.7 cm along the proximal abdominal aorta. Aneurysmal dilatation resolves just distal to the origin of the inferior mesenteric artery. Scattered calcification along the abdominal aorta. 4. Cholelithiasis; gallbladder otherwise unremarkable in appearance. 5. Right renal cyst noted. 6. Mild degenerative change along the lower lumbar spine.  These results were called by telephone at the time of interpretation on 06/29/2014 at 12:25 am to Dr. Ernestina Patches, who verbally acknowledged these results.   Electronically  Signed   By: Garald Balding M.D.   On: 06/29/2014 00:34   Dg Chest 2 View  06/28/2014   CLINICAL DATA:  Fever.  COPD.  EXAM: CHEST  2 VIEW  COMPARISON:  03/27/2014  FINDINGS: Mild cardiomegaly remains stable. No evidence of pulmonary infiltrate or edema. No evidence of pleural effusion. Mild scarring again seen in left lung base. Previous median sternotomy again noted.  IMPRESSION: Stable mild cardiomegaly and left basilar scarring. No active disease.   Electronically Signed   By: Earle Gell M.D.   On: 06/28/2014 20:58   US Abdomen Limited Ruq  06/28/2014   CLINICAL DATA:  Right upper quadrant pain history of gallstones  EXAM: US ABDOMEN LIMITED - RIGHT  UPPER QUADRANT  COMPARISON:  None.  FINDINGS: Gallbladder:  Cholelithiasis. There is wall thickening to 4 mm but this is likely from underdistention of the gallbladder. No focal tenderness.  Common bile duct:  Diameter: 4 mm  Liver:  No focal lesion identified. Within normal limits in parenchymal echogenicity.  Partly visible cyst from the right kidney measuring approximately 3 cm.  IMPRESSION: Cholelithiasis without acute cholecystitis.   Electronically Signed   By: Monte Fantasia M.D.   On: 06/28/2014 23:33    Medications: Scheduled Meds: . aspirin EC  81 mg Oral Daily  . atorvastatin  20 mg Oral q1800  . ciprofloxacin  500 mg Oral BID  . donepezil  10 mg Oral QHS  . enoxaparin (LOVENOX) injection  30 mg Subcutaneous Q24H  . levothyroxine  112 mcg Oral QAC breakfast  . memantine  28 mg Oral Daily  . metoprolol  25 mg Oral BID  . metroNIDAZOLE  500 mg Oral 3 times per day  . mometasone-formoterol  2 puff Inhalation BID  . potassium chloride  40 mEq Oral Daily  . saccharomyces boulardii  250 mg Oral BID  . sertraline  50 mg Oral Daily  . vancomycin  125 mg Oral 4 times per day    time spent 25 minutes    LOS: 5 days   Kenyon Eshleman M.D. [facility] 07/04/2014, 12:04 PM Pager: [phone]  If 7PM-7AM, please contact  night-coverage [email or web address] Password [email or web address]

## 2014-07-04 NOTE — Progress Notes (Addendum)
[facility]  Date of Admission:  06/28/2014  Date of Consult:  07/04/2014  Reason for Consult: C diff, recurrent  (2nd episode) Referring Physician: Rai  Impression/Recommendation C diff, recurrent  (2nd episode) Acute diverticulitis with micro perforation COPD HTN, CKD AVR, bioprosthetic ([facility] 2010)  Would Continue cipro/flagyl for 14 day total (end date 07-12-14) 6 week tapering course of po vanco as follows-  155m qid for 14 days 1279mq12h for 7 days 12577mday for 7 days 125m58md for 14 days  Comment- She appears to be doing well, loose BM have slowed. Her Cr has improved.  Available if questions.   Thank you so much for this interesting consult,   JeffBobby Johnson) [phone]  Sarah TACKERan 81 y30. female.  HPI: 81 y78F with hx of  C diff 02-2014 when being treated for pneumonia. She returns 3-5 with abd pain and fever 101.4 that started on 3-4 after taking e-mycin after dental extraction. She was found on CT to have acute diverticulitis with small perforation. Her WBC was 19.7. She was started on cipro/flagyl and then developed some loose stools. She had C diff PCR positive from admission.  She defervesced in hospital and her WBC has decreased to 13.4.  Her diarrhea worsened on 3-8 and po vanco was added to her anbx.  With regards to her diverticulitis, she was managed medically and seen by surgery. She was recommended to have 2 weeks of cipro/flagyl.     Past Medical History  Diagnosis Date  . Emphysema     followed by Dr. WrigJoya GaskinsS/P thoracic aortic aneurysm repair 1/10    also [facility] she has a descending thoraic aorta anuerysm and [facility] KincLunette Stands. Pulmonary embolism 2010    She is no onger taking coumadin  . Hypothyroidism   . Blind right eye   . Hyperlipidemia   . S/P aortic  valve replacement with bioprosthetic valve     ehco (8/11) with EF 55-60%, mild-mod MR, mild-mod TR, poorly visualized bioprosthetic aortic valve but no significant regurgitation and gradient not significanly elevated.    . CKD (chronic kidney disease)   . HTN (hypertension)   . History of Doppler echocardiogram     carotid dopplers. (8/11) no significant stenosis  . COPD (chronic obstructive pulmonary disease)   . SVT (supraventricular tachycardia)     HOLTER MONITOR AFTER SURGERY   . GERD (gastroesophageal reflux disease)   . Arthritis   . Cataract     RT  . Hx of echocardiogram 2015    Echo (08/2013):  EF 60-65%, no RWMA, Gr 1 DD, AVR ok (mean 5 mmHg), mild MR, PASP 22 mmHg    Past Surgical History  Procedure Laterality Date  . Thoracoabdominal aortic aneurysm repair  01/28/09    repair  . Anomalous pulmonary venous return repair    . Thoracic aortic aneurysm repair  1/10    VALVE ALSO DONE  . Back surgery  1970   . Eye surgery      AS BABY- RT EYE  POOR SIGHT  . Tonsillectomy    . Lumbar laminectomy/decompression microdiscectomy  05/31/2011    Procedure: LUMBAR LAMINECTOMY/DECOMPRESSION MICRODISCECTOMY;  Surgeon: GaryElaina Hoops;  [facility];  Service: Neurosurgery;  Laterality: Bilateral;  Bilateral Lumbar three-four decompressionj lumbar laminectomy  Allergies  Allergen Reactions  . Codeine Other (See Comments)    Pt. States it has been too long to remember the reaction   . Penicillins Other (See Comments)    Pt. Does not recall the reaction.     Medications:  Scheduled: . aspirin EC  81 mg Oral Daily  . atorvastatin  20 mg Oral q1800  . ciprofloxacin  500 mg Oral BID  . donepezil  10 mg Oral QHS  . [START ON 07/05/2014] enoxaparin (LOVENOX) injection  40 mg Subcutaneous Q24H  . levothyroxine  112 mcg Oral QAC breakfast  . memantine  28 mg Oral Daily  . metoprolol  25 mg Oral BID  . metroNIDAZOLE  500 mg Oral 3 times per day  .  mometasone-formoterol  2 puff Inhalation BID  . potassium chloride  40 mEq Oral Daily  . saccharomyces boulardii  250 mg Oral BID  . sertraline  50 mg Oral Daily  . vancomycin  125 mg Oral 4 times per day    Abtx:  Anti-infectives    Start     Dose/Rate Route Frequency Ordered Stop   07/02/14 1600  vancomycin (VANCOCIN) 50 mg/mL oral solution 125 mg     125 mg Oral 4 times per day 07/02/14 1336     07/02/14 1400  metroNIDAZOLE (FLAGYL) tablet 500 mg     500 mg Oral 3 times per day 07/02/14 1149     07/02/14 1200  ciprofloxacin (CIPRO) tablet 500 mg     500 mg Oral 2 times daily 07/02/14 1149     06/29/14 1400  metroNIDAZOLE (FLAGYL) IVPB 500 mg  Status:  Discontinued     500 mg 100 mL/hr over 60 Minutes Intravenous Every 8 hours 06/29/14 0314 07/02/14 1149   06/29/14 1200  ciprofloxacin (CIPRO) IVPB 400 mg  Status:  Discontinued     400 mg 200 mL/hr over 60 Minutes Intravenous Every 12 hours 06/29/14 0314 07/02/14 1149   06/29/14 0045  ciprofloxacin (CIPRO) IVPB 400 mg     400 mg 200 mL/hr over 60 Minutes Intravenous  Once 06/29/14 0030 06/29/14 0207   06/29/14 0045  metroNIDAZOLE (FLAGYL) IVPB 500 mg     500 mg 100 mL/hr over 60 Minutes Intravenous  Once 06/29/14 0030 06/29/14 0402      Total days of antibiotics 6          Social History:  reports that she quit smoking about 10 years ago. Her smoking use included Cigarettes. She has a 20 pack-year smoking history. She has never used smokeless tobacco. She reports that she does not drink alcohol or use illicit drugs.  Family History  Problem Relation Age of Onset  . Brain cancer Maternal Grandmother   . Coronary artery disease      no premature CAD    General ROS: blind in R eye. normal L. eating well. no SOB, no cough. no loose BM today. see HPI   Blood pressure 116/48, pulse 64, temperature 98.5 F (36.9 C), temperature source Oral, resp. rate 18, height 5' (1.524 m), weight 59.421 kg (131 lb), SpO2 94 %. General  appearance: alert, cooperative and no distress Eyes: R eye cloudy Throat: normal findings: oropharynx pink & moist without lesions or evidence of thrush Lungs: clear to auscultation bilaterally Heart: regular rate and rhythm Abdomen: normal findings: bowel sounds normal and soft, non-tender Extremities: edema none   Results for orders placed or performed during the hospital encounter of 06/28/14 (from the past 48  hour(s))  CBC     Status: Abnormal   Collection Time: 07/04/14  5:43 AM  Result Value Ref Range   WBC 13.4 (H) 4.0 - 10.5 K/uL   RBC 3.70 (L) 3.87 - 5.11 MIL/uL   Hemoglobin 10.4 (L) 12.0 - 15.0 g/dL   HCT 32.8 (L) 36.0 - 46.0 %   MCV 88.6 78.0 - 100.0 fL   MCH 28.1 26.0 - 34.0 pg   MCHC 31.7 30.0 - 36.0 g/dL   RDW 16.0 (H) 11.5 - 15.5 %   Platelets 250 150 - 400 K/uL  Basic metabolic panel     Status: Abnormal   Collection Time: 07/04/14  5:43 AM  Result Value Ref Range   Sodium 141 135 - 145 mmol/L   Potassium 3.4 (L) 3.5 - 5.1 mmol/L   Chloride 109 96 - 112 mmol/L   CO2 24 19 - 32 mmol/L   Glucose, Bld 97 70 - 99 mg/dL   BUN 11 6 - 23 mg/dL   Creatinine, Ser 1.18 (H) 0.50 - 1.10 mg/dL   Calcium 8.9 8.4 - 10.5 mg/dL   GFR calc non Af Amer 43 (L) >90 mL/min   GFR calc Af Amer 49 (L) >90 mL/min    Comment: (NOTE) The eGFR has been calculated using the CKD EPI equation. This calculation has not been validated in all clinical situations. eGFR's persistently <90 mL/min signify possible Chronic Kidney Disease.    Anion gap 8 5 - 15      Component Value Date/Time   SDES BLOOD LEFT HAND 06/29/2014 0355   SPECREQUEST BOTTLES DRAWN AEROBIC ONLY 10CC 06/29/2014 0355   CULT  06/29/2014 0355           BLOOD CULTURE RECEIVED NO GROWTH TO DATE CULTURE WILL BE HELD FOR 5 DAYS BEFORE ISSUING A FINAL NEGATIVE REPORT Performed at [place] PENDING 06/29/2014 0355   No results found. Recent Results (from the past 240 hour(s))  Urine culture      Status: None   Collection Time: 06/28/14  9:24 PM  Result Value Ref Range Status   Specimen Description URINE, CLEAN CATCH  Final   Special Requests NONE  Final   Colony Count   Final    50,000 COLONIES/ML Performed at [facility]    Culture   Final    Multiple bacterial morphotypes present, none predominant. Suggest appropriate recollection if clinically indicated. Performed at [facility]    Report Status 06/30/2014 FINAL  Final  Culture, blood (routine x 2)     Status: None (Preliminary result)   Collection Time: 06/29/14  3:50 AM  Result Value Ref Range Status   Specimen Description BLOOD LEFT ARM  Final   Special Requests BOTTLES DRAWN AEROBIC ONLY 10CC  Final   Culture   Final           BLOOD CULTURE RECEIVED NO GROWTH TO DATE CULTURE WILL BE HELD FOR 5 DAYS BEFORE ISSUING A FINAL NEGATIVE REPORT Note: Culture results may be compromised due to an excessive volume of blood received in culture bottles. Performed at [facility]    Report Status PENDING  Incomplete  Culture, blood (routine x 2)     Status: None (Preliminary result)   Collection Time: 06/29/14  3:55 AM  Result Value Ref Range Status   Specimen Description BLOOD LEFT HAND  Final   Special Requests BOTTLES DRAWN AEROBIC ONLY 10CC  Final   Culture  Final           BLOOD CULTURE RECEIVED NO GROWTH TO DATE CULTURE WILL BE HELD FOR 5 DAYS BEFORE ISSUING A FINAL NEGATIVE REPORT Performed at [facility]    Report Status PENDING  Incomplete  Clostridium Difficile by PCR     Status: Abnormal   Collection Time: 06/29/14 10:51 AM  Result Value Ref Range Status   C difficile by pcr POSITIVE (A) NEGATIVE Final    Comment: CRITICAL RESULT CALLED TO, READ BACK BY AND VERIFIED WITH: WILLARD,W AT 12:25PM ON 06/29/14 BY FESTERMAN,C       07/04/2014, 3:16 PM     LOS: 5 days

## 2014-07-05 ENCOUNTER — Other Ambulatory Visit: Payer: Self-pay | Admitting: Internal Medicine

## 2014-07-05 LAB — CULTURE, BLOOD (ROUTINE X 2)
Culture: NO GROWTH
Culture: NO GROWTH

## 2014-07-05 MED ORDER — CIPROFLOXACIN HCL 500 MG PO TABS: 500.0000 mg | ORAL_TABLET | Freq: Two times a day (BID) | ORAL | Status: AC

## 2014-07-05 MED ORDER — POTASSIUM CHLORIDE CRYS ER 20 MEQ PO TBCR: 20.0000 meq | EXTENDED_RELEASE_TABLET | Freq: Every day | ORAL | Status: DC

## 2014-07-05 MED ORDER — WHITE PETROLATUM GEL: 1.0000 "application " | Status: DC | PRN

## 2014-07-05 MED ORDER — METRONIDAZOLE 500 MG PO TABS: 500.0000 mg | ORAL_TABLET | Freq: Three times a day (TID) | ORAL | Status: AC

## 2014-07-05 MED ORDER — HYDROCODONE-ACETAMINOPHEN 5-325 MG PO TABS: 1.0000 | ORAL_TABLET | Freq: Four times a day (QID) | ORAL | Status: DC | PRN

## 2014-07-05 MED ORDER — VANCOMYCIN 50 MG/ML ORAL SOLUTION: 125.0000 mg | Freq: Four times a day (QID) | ORAL | Status: DC

## 2014-07-05 NOTE — Progress Notes (Signed)
Pt discharged home.  Discharge instructions reviewed with patient and daughter in law.  Prescriptions given to daughter in law.  IV d/c with catheter intact.  VSS and no patient complaints. Pt discharged to awaiting private vehicle.

## 2014-07-05 NOTE — Discharge Summary (Signed)
Physician Discharge Summary  Patient ID: Sarah Johnson MRN: [id number] DOB/AGE: [id number] 79 y.o.  Admit date: 06/28/2014 Discharge date: 07/05/2014  Primary Care Physician:  Tommy Medal, MD  Discharge Diagnoses:    Acute diverticulitis of the colon with microperforation  C. difficile colitis  . Essential hypertension . COPD with chronic bronchitis . CKD (chronic kidney disease) . Diverticulitis of colon with perforation   Consults: Gen. surgery, Dr. Lucia Gaskins Infectious disease, Dr. Johnnye Sima   Recommendations for Outpatient Follow-up:  Per ID recommendations  Continue cipro/flagyl for 14 day total (end date 07-12-14) for acute diverticulitis with microperforation  6 week tapering course of po vancomycin for C. difficile colitis, second episode as follows-  14m qid for 14 days 1210mq12h for 7 days 12519mday for 7 days 125m50md for 14 days  TESTS THAT NEED FOLLOW-UP CBC, BMET   DIET: Soft diet    Allergies:   Allergies  Allergen Reactions  . Codeine Other (See Comments)    Pt. States it has been too long to remember the reaction   . Penicillins Other (See Comments)    Pt. Does not recall the reaction.      Discharge Medications:   Medication List    STOP taking these medications        erythromycin base 500 MG tablet  Commonly known as:  E-MYCIN      TAKE these medications        acetaminophen 325 MG tablet  Commonly known as:  TYLENOL  Take 2 tablets (650 mg total) by mouth every 4 (four) hours as needed for fever, headache or mild pain.     amLODipine 5 MG tablet  Commonly known as:  NORVASC  Take 5 mg by mouth daily.     aspirin EC 81 MG tablet  Take 1 tablet (81 mg total) by mouth daily.     atorvastatin 20 MG tablet  Commonly known as:  LIPITOR  TAKE 1 TABLET BY MOUTH EVERY DAY     Calcium-Vitamin D 600-200 MG-UNIT Caps  Take 1 tablet by mouth daily.     ciprofloxacin 500 MG tablet  Commonly known as:  CIPRO  Take 1 tablet  (500 mg total) by mouth 2 (two) times daily. X 8 more days     donepezil 10 MG tablet  Commonly known as:  ARICEPT  Take 1 tablet (10 mg total) by mouth at bedtime.     ergocalciferol 50000 UNITS capsule  Commonly known as:  VITAMIN D2  Take 50,000 Units by mouth every 30 (thirty) days.     HYDROcodone-acetaminophen 5-325 MG per tablet  Commonly known as:  NORCO/VICODIN  Take 1 tablet by mouth every 6 (six) hours as needed for severe pain.     ibandronate 150 MG tablet  Commonly known as:  BONIVA  Take 150 mg by mouth every 30 (thirty) days. Take in the morning with a full glass of water, on an empty stomach, and do not take anything else by mouth or lie down for the next 30 min.     levothyroxine 112 MCG tablet  Commonly known as:  SYNTHROID, LEVOTHROID  Take 112 mcg by mouth daily before breakfast.     metoprolol tartrate 25 MG tablet  Commonly known as:  LOPRESSOR  Take 1 tablet (25 mg total) by mouth 2 (two) times daily.     metroNIDAZOLE 500 MG tablet  Commonly known as:  FLAGYL  Take 1 tablet (500 mg total) by mouth 3 (three)  times daily. 8 more days, Stop date 3/18     mometasone-formoterol 100-5 MCG/ACT Aero  Commonly known as:  DULERA  INHALE 2 PUFFS INTO THE LUNGS 2 (TWO) TIMES DAILY. RINSE MOUTH AFTER EACH USE     multivitamin with minerals Tabs tablet  Take 1 tablet by mouth daily.     NAMENDA XR 28 MG Cp24 24 hr capsule  Generic drug:  memantine  Take 1 tablet by mouth daily.     potassium chloride SA 20 MEQ tablet  Commonly known as:  K-DUR,KLOR-CON  Take 1 tablet (20 mEq total) by mouth daily.     PRESCRIPTION MEDICATION  Take 1 capsule by mouth as directed. Patient has an Antibiotic prescribed by dentist that patient takes 1 hour before dental work due to artificial heart valves. Unsure of name of antibiotic and can not clarify what it is due to it being an old refill patient keeps at home for dental work.     PROAIR HFA 108 (90 BASE) MCG/ACT inhaler   Generic drug:  albuterol  Inhale 2 puffs into the lungs every 6 (six) hours as needed.     saccharomyces boulardii 250 MG capsule  Commonly known as:  FLORASTOR  Take 1 capsule (250 mg total) by mouth 2 (two) times daily.     sertraline 50 MG tablet  Commonly known as:  ZOLOFT  Take 50 mg by mouth daily.     vancomycin 50 mg/mL oral solution  Commonly known as:  VANCOCIN  - Take 2.5 mLs (125 mg total) by mouth every 6 (six) hours. 6 week tapering course of po vanco as follows-   - 1104m qid for 14 days, started on 07/02/14  - 1282mq12h for 7 days- start on 3/23  - 12590mday for 7 days- start on 3/30  - 125m63md for 14 days- start on 4/6 till 4/19     white petrolatum Gel  Commonly known as:  VASELINE  Apply 1 application topically as needed (apply to bottom).         Brief H and P: For complete details please refer to admission H and P, but in brief Patient is a 81 y66r old female with emphysema, thoracic aortic aneurysm repair in 1/10, pulmonary embolism, 2010, hypothyroidism, hyperlipidemia, chronic kidney disease, hypertension, GERD, prior history of diverticulitis resulting in Escherichia coli sepsis in 2014. Patient had developed C. difficile and completed course with improvement in November. On the day of admission, presented with abdominal pain, fever 101.4, diarrhea that started a day before. Patient had a tooth extraction and had just been placed on erythromycin. On Arrival to ER patient met sepsis criteria given elevated WBC up to 19.7 fever up top 100.8, BP soft 110/46 initially but now improved.  CT of the abdomen showed Acute diverticulitis along the distal sigmoid colon, with diffuse soft tissue inflammation and trace free fluid. Small perforation noted, with two small foci of air seen tracking superiorly along the mesentery. Cholelithiasis without evidence of cholecystitis noted on [place]. [place] Course:  Acute Diverticulitis of colon with microperforation The  patient was placed on IV ciprofloxacin and Flagyl. General surgery was consulted. Per general surgery, continue medical management, no surgical indication. Surgery recommended ciprofloxacin and Flagyl for diverticulitis and she will need colonoscopy in 6-8 weeks. Patient will likely need GI referral per PCP   C. difficile colitis - recurrent, second episode Patient was noticed to have watery diarrhea, on 3/8 patient was placed on oral vancomycin. She was already  on IV Flagyl. She has a prior history of C. difficile colitis in November 2015. ID consult was obtained, patient was seen by Dr. Johnnye Sima who recommended prolonged oral vancomycin with a taper for 6 weeks as described in the instructions.   Essential hypertension - BP remained stable   COPD with chronic bronchitis - Currently stable, no wheezing    S/P aortic valve replacement   CKD (chronic kidney disease) stage III - Creatinine function improved, 1.1 at the time of discharge  hypothyroidism - Continue Synthroid   Day of Discharge BP 139/60 mmHg  Pulse 67  Temp(Src) 98.2 F (36.8 C) (Oral)  Resp 18  Ht 5' (1.524 m)  Wt 59.421 kg (131 lb)  BMI 25.58 kg/m2  SpO2 87%  Physical Exam: General: Alert and awake oriented x3 not in any acute distress. HEENT: anicteric sclera, pupils reactive to light and accommodation CVS: S1-S2 clear no murmur rubs or gallops Chest: clear to auscultation bilaterally, no wheezing rales or rhonchi Abdomen: soft nontender, nondistended, normal bowel sounds Extremities: no cyanosis, clubbing or edema noted bilaterally Neuro: Cranial nerves II-XII intact, no focal neurological deficits   The results of significant diagnostics from this hospitalization (including imaging, microbiology, ancillary and laboratory) are listed below for reference.    LAB RESULTS: Basic Metabolic Panel:  Recent Labs Lab 06/29/14 0355 07/01/14 0550 07/04/14 0543  NA 135 137 141  K 3.7 3.5 3.4*  CL 107  106 109  CO2 _0 GLUCOSE 102* 107* 97  BUN _1 CREATININE 1.20* 1.13* 1.18*  CALCIUM 8.1* 8.1* 8.9  MG 1.5  --   --   PHOS 3.0  --   --    Liver Function Tests:  Recent Labs Lab 06/28/14 2218 06/29/14 0355  AST 23 20  ALT 13 12  ALKPHOS 58 55  BILITOT 0.4 0.8  PROT 6.6 6.0  ALBUMIN 3.3* 3.0*    Recent Labs Lab 06/28/14 1821 06/28/14 2218  LIPASE 31 27   No results for input(s): AMMONIA in the last 168 hours. CBC:  Recent Labs Lab 07/01/14 0820 07/04/14 0543  WBC 15.2* 13.4*  HGB 10.4* 10.4*  HCT 33.7* 32.8*  MCV 90.6 88.6  PLT 244 250   Cardiac Enzymes: No results for input(s): CKTOTAL, CKMB, CKMBINDEX, TROPONINI in the last 168 hours. BNP: Invalid input(s): POCBNP CBG: No results for input(s): GLUCAP in the last 168 hours.  Significant Diagnostic Studies:  Ct Abdomen Pelvis Wo Contrast  06/29/2014   CLINICAL DATA:  Acute onset of fever. Not feeling well. Diarrhea. Leukocytosis. Initial encounter.  EXAM: CT ABDOMEN AND PELVIS WITHOUT CONTRAST  TECHNIQUE: Multidetector CT imaging of the abdomen and pelvis was performed following the standard protocol without IV contrast.  COMPARISON:  CT of the abdomen and pelvis from 03/25/2014  FINDINGS: Minimal left basilar atelectasis is noted.  The liver is unremarkable in appearance. The spleen is absent. Stones are noted dependently within the gallbladder; the gallbladder is otherwise unremarkable. The pancreas and adrenal glands are unremarkable.  A 3.7 cm cyst is noted at the upper pole of the right kidney. Mild nonspecific perinephric stranding is noted on the left. The kidneys are otherwise unremarkable. No hydronephrosis is seen. No renal or ureteral stones are identified.  There is herniation of a short segment of distal ileum into a small right inguinal hernia, without evidence for obstruction. The visualized small bowel is otherwise unremarkable. The stomach is within normal limits. No acute vascular  abnormalities are  seen.  There is aneurysmal dilatation of the distal thoracic and proximal abdominal aorta, measuring up to 4.8 cm just above the diaphragm, and 3.7 cm along the proximal abdominal aorta. Aneurysmal dilatation resolves just distal to the origin of the inferior mesenteric artery. Scattered calcification is seen along the abdominal aorta.  The celiac trunk, superior mesenteric artery and bilateral renal arteries are grossly unremarkable.  The appendix is grossly unremarkable in appearance; there is no evidence for appendicitis.  There is acute diverticulitis along the distal sigmoid colon, with diffuse soft tissue inflammation and trace free fluid. Two small foci of air are seen tracking superiorly along the mesentery, concerning for a small perforation.  Scattered diverticulosis is noted along the distal descending and sigmoid colon. Contrast progresses to the level of the rectum. The colon is otherwise unremarkable.  The bladder is mildly distended and grossly unremarkable. The uterus is unremarkable in appearance. The ovaries are grossly symmetric; no suspicious adnexal masses are seen. No inguinal lymphadenopathy is seen.  No acute osseous abnormalities are identified. Vacuum phenomenon is noted at L3-L4 and L4-L5. There is partial sacralization of vertebral body L5. Underlying facet disease is noted. There is grade 1 anterolisthesis of L4 on L5.  IMPRESSION: 1. Acute diverticulitis along the distal sigmoid colon, with diffuse soft tissue inflammation and trace free fluid. Small perforation noted, with two small foci of air seen tracking superiorly along the mesentery. 2. Scattered diverticulosis along the distal descending and sigmoid colon. 3. Aneurysmal dilatation of the distal thoracic and proximal abdominal aorta is grossly stable, measuring up to 4.8 cm just above the diaphragm, and 3.7 cm along the proximal abdominal aorta. Aneurysmal dilatation resolves just distal to the origin of the  inferior mesenteric artery. Scattered calcification along the abdominal aorta. 4. Cholelithiasis; gallbladder otherwise unremarkable in appearance. 5. Right renal cyst noted. 6. Mild degenerative change along the lower lumbar spine.  These results were called by telephone at the time of interpretation on 06/29/2014 at 12:25 am to Dr. Ernestina Patches, who verbally acknowledged these results.   Electronically Signed   By: Garald Balding M.D.   On: 06/29/2014 00:34   Dg Chest 2 View  06/28/2014   CLINICAL DATA:  Fever.  COPD.  EXAM: CHEST  2 VIEW  COMPARISON:  03/27/2014  FINDINGS: Mild cardiomegaly remains stable. No evidence of pulmonary infiltrate or edema. No evidence of pleural effusion. Mild scarring again seen in left lung base. Previous median sternotomy again noted.  IMPRESSION: Stable mild cardiomegaly and left basilar scarring. No active disease.   Electronically Signed   By: Earle Gell M.D.   On: 06/28/2014 20:58   US Abdomen Limited Ruq  06/28/2014   CLINICAL DATA:  Right upper quadrant pain history of gallstones  EXAM: US ABDOMEN LIMITED - RIGHT UPPER QUADRANT  COMPARISON:  None.  FINDINGS: Gallbladder:  Cholelithiasis. There is wall thickening to 4 mm but this is likely from underdistention of the gallbladder. No focal tenderness.  Common bile duct:  Diameter: 4 mm  Liver:  No focal lesion identified. Within normal limits in parenchymal echogenicity.  Partly visible cyst from the right kidney measuring approximately 3 cm.  IMPRESSION: Cholelithiasis without acute cholecystitis.   Electronically Signed   By: Monte Fantasia M.D.   On: 06/28/2014 23:33    2D ECHO:   Disposition and Follow-up:     Discharge Instructions    Diet - low sodium heart healthy    Complete by:  As directed  Discharge instructions    Complete by:  As directed   Please continue Cipro and flagyl till 07/12/14  6 week tapering course of po vanco as follows-  19m qid for 14 days, started on 07/02/14 till  3/22 1239mq12h for 7 days- start on 3/23 12542mday for 7 days- start on 3/30 125m2md for 14 days- start on 4/6 till 4/19     Increase activity slowly    Complete by:  As directed             DISPOSITION:home   DISCHARGE FOLLOW-UP Follow-up Information    Follow up with PANG,RICHARD, MD. Schedule an appointment as soon as possible for a visit in 2 weeks.   Specialty:  Internal Medicine   Contact information:   [phone]   Follow up with [employer]:  will contact you to set up initial visit for PT, OT, RN services   Contact information:   [phone]    Time spent on Discharge: 40 mins  Signed:   RAI,RIPUDEEP M.D. [facility] 07/05/2014, 3:27 PM Pager: [phone]

## 2014-07-05 NOTE — Progress Notes (Signed)
CARE MANAGEMENT NOTE 07/05/2014  Patient:  Sarah Johnson, Sarah Johnson   Account Number:  [id number]  Date Initiated:  07/02/2014  Documentation initiated by:  Edwyna Shell  Subjective/Objective Assessment:   81 yo female admitted with Diverticulitis of colon with perforation from home     Action/Plan:   discharge planning   Anticipated DC Date:  07/05/2014   Anticipated DC Plan:  [place]  CM consult      [place]   Choice offered to / List presented to:  C-1 Patient        [place] arranged  Kyle RN  [place].   Status of service:  Completed, signed off Medicare Important Message given?  YES (If response is "NO", the following Medicare IM given date fields will be blank) Date Medicare IM given:  07/05/2014 Medicare IM given by:  Edwyna Shell Date Additional Medicare IM given:   Additional Medicare IM given by:    Discharge Disposition:  [place]  Per UR Regulation:    If discussed at Long Length of Stay Meetings, dates discussed:    Comments:  07/05/14 Edwyna Shell RN BSN CM 12 [phone] Spoke with patient regarding [facility] services and she stated that she has used [facility] in the past and would like to use them again. Referral communicated to Marceline, [facility] rep.  07/02/14 Edwyna Shell RN BSN CM [phone] Patient lives at home independently, drives self, and has no DME. She stated that she has 2 daughters that live nearby and they help out as needed. No CM needs at this time, will continue to follow.

## 2014-07-11 ENCOUNTER — Ambulatory Visit: Payer: Medicare Other | Admitting: Neurology

## 2014-07-15 ENCOUNTER — Encounter: Payer: Self-pay | Admitting: Critical Care Medicine

## 2014-07-15 DIAGNOSIS — E559 Vitamin D deficiency, unspecified: Secondary | ICD-10-CM | POA: Diagnosis not present

## 2014-07-15 DIAGNOSIS — E039 Hypothyroidism, unspecified: Secondary | ICD-10-CM | POA: Diagnosis not present

## 2014-07-15 DIAGNOSIS — A047 Enterocolitis due to Clostridium difficile: Secondary | ICD-10-CM | POA: Diagnosis not present

## 2014-07-15 DIAGNOSIS — I10 Essential (primary) hypertension: Secondary | ICD-10-CM | POA: Diagnosis not present

## 2014-07-16 ENCOUNTER — Other Ambulatory Visit: Payer: Self-pay | Admitting: Critical Care Medicine

## 2014-08-01 DIAGNOSIS — B9689 Other specified bacterial agents as the cause of diseases classified elsewhere: Secondary | ICD-10-CM | POA: Diagnosis not present

## 2014-08-01 DIAGNOSIS — E039 Hypothyroidism, unspecified: Secondary | ICD-10-CM | POA: Diagnosis not present

## 2014-08-01 DIAGNOSIS — J449 Chronic obstructive pulmonary disease, unspecified: Secondary | ICD-10-CM | POA: Diagnosis not present

## 2014-08-01 DIAGNOSIS — I10 Essential (primary) hypertension: Secondary | ICD-10-CM | POA: Diagnosis not present

## 2014-08-02 ENCOUNTER — Other Ambulatory Visit: Payer: Self-pay | Admitting: Critical Care Medicine

## 2014-08-08 DIAGNOSIS — M810 Age-related osteoporosis without current pathological fracture: Secondary | ICD-10-CM | POA: Diagnosis not present

## 2014-09-16 ENCOUNTER — Ambulatory Visit: Payer: Medicare Other | Admitting: Neurology

## 2014-09-16 ENCOUNTER — Telehealth: Payer: Self-pay

## 2014-09-16 NOTE — Telephone Encounter (Signed)
[  09/16/2014 11:05 AM] Ileana Roup:  Patient's daughter Drue Dun called to let us know her mother Sarah Johnson dob 2032-09-20 will not be in today at 3:30.  Her mother refuses to come in. Drue Dun states her mind is not right and she just won't come.  Thanks!

## 2014-09-19 ENCOUNTER — Other Ambulatory Visit: Payer: Self-pay | Admitting: Cardiology

## 2014-09-20 NOTE — Telephone Encounter (Signed)
Per note 5.9.15

## 2014-10-18 ENCOUNTER — Other Ambulatory Visit: Payer: Self-pay | Admitting: Neurology

## 2014-10-18 ENCOUNTER — Other Ambulatory Visit: Payer: Self-pay | Admitting: Cardiology

## 2014-11-12 DIAGNOSIS — N390 Urinary tract infection, site not specified: Secondary | ICD-10-CM | POA: Diagnosis not present

## 2014-11-12 DIAGNOSIS — M8580 Other specified disorders of bone density and structure, unspecified site: Secondary | ICD-10-CM | POA: Diagnosis not present

## 2014-11-12 DIAGNOSIS — E559 Vitamin D deficiency, unspecified: Secondary | ICD-10-CM | POA: Diagnosis not present

## 2014-11-12 DIAGNOSIS — I10 Essential (primary) hypertension: Secondary | ICD-10-CM | POA: Diagnosis not present

## 2014-11-12 DIAGNOSIS — F419 Anxiety disorder, unspecified: Secondary | ICD-10-CM | POA: Diagnosis not present

## 2014-11-12 DIAGNOSIS — E039 Hypothyroidism, unspecified: Secondary | ICD-10-CM | POA: Diagnosis not present

## 2014-11-12 DIAGNOSIS — E780 Pure hypercholesterolemia: Secondary | ICD-10-CM | POA: Diagnosis not present

## 2015-01-22 ENCOUNTER — Encounter: Payer: Self-pay | Admitting: Neurology

## 2015-01-22 ENCOUNTER — Ambulatory Visit (INDEPENDENT_AMBULATORY_CARE_PROVIDER_SITE_OTHER): Payer: Medicare Other | Admitting: Neurology

## 2015-01-22 VITALS — BP 112/60 | HR 60 | Resp 24 | Ht 62.0 in | Wt 143.0 lb

## 2015-01-22 DIAGNOSIS — Z954 Presence of other heart-valve replacement: Secondary | ICD-10-CM | POA: Diagnosis not present

## 2015-01-22 DIAGNOSIS — Z9889 Other specified postprocedural states: Secondary | ICD-10-CM

## 2015-01-22 DIAGNOSIS — F0390 Unspecified dementia without behavioral disturbance: Secondary | ICD-10-CM

## 2015-01-22 DIAGNOSIS — J449 Chronic obstructive pulmonary disease, unspecified: Secondary | ICD-10-CM | POA: Diagnosis not present

## 2015-01-22 DIAGNOSIS — Z952 Presence of prosthetic heart valve: Secondary | ICD-10-CM

## 2015-01-22 DIAGNOSIS — J438 Other emphysema: Secondary | ICD-10-CM | POA: Diagnosis not present

## 2015-01-22 DIAGNOSIS — Z8679 Personal history of other diseases of the circulatory system: Secondary | ICD-10-CM

## 2015-01-22 NOTE — Progress Notes (Signed)
Subjective:    Patient ID: Sarah Johnson is a 81 y.o. female.  HPI     Interim history:   Sarah Johnson is a 81 year old right-handed woman with an underlying complex medical history of COPD, prior PE, status post AVR (porcine), hyperlipidemia, hypothyroidism, R eye loss as infant, hypertension, vitamin D deficiency, status post aortic aneurysm repair (12/2007 and 1/10) and chronic renal insufficiency, s/p back surgery in 2013, who presents for followup consultation of her memory loss. She is accompanied by her daughter, Sarah Johnson, today. Sadly, her husband in December 2015. Of note, she no showed for an appointment on 09/16/2014. I last saw her on 01/10/2014, at which time she reported feeling stable. She was not drinking enough water. She was tolerating Aricept 10 mg daily. She was taking an over-the-counter supplement for memory called Prevagen, which is I believe made of jellyfish protein. Her MMSE was 25 out of 30 at the time. I suggested she continue with Aricept generic at 10 mg strength.   Today, 01/22/2015: She reports doing fairly. She is on dual memory medications, and has been on Namenda XR 28 mg and generic Aricept 10 md daily for over a year, per prior PCP, Sarah Johnson. Sarah Johnson lives a couple of miles away. Patient had to sell her [place]. Sarah Johnson is also local and helps out with appointments. Patient refused the nocturnal O2 in January. Has not seen Sarah Johnson since January and has not rescheduled. She had an ONO on RA in Jan, which I reviewed. Her baseline O2 sats were 89.3%, nadir was 77%, time below 88% saturation was 77 min. she does not always drink enough water. She likes sodas and also drinks tea with dinner. She has managed fairly well after her husband died. She drives some, typically within 3-4 mile radius, typically to 3 or 4 different places that she usually goes to such as bank, grocery store, church etc. She does not drive at night or long-distance or on highways. Her son lives in  [place]. All 3 of them are helping out.  Previously:   I first met her on 08/20/2013 at the request of her primary care physician, at which time she and her family reported a progressive memory loss for the past 2-3 years and more progressive since her last hospitalization in December 2014. Her MMSE was 26/30, clock drawing was 4 out of 4 but animal fluency was only 3 per minute at the first visit with me. She was already on Aricept 5 mg and I suggested we increase it to 10 mg strength. We also discussed potentially pursuing formal neuropsychological testing in the near future. She had been started on antidepressant recently and I suggested she continue with that. I suspected a component of anxiety and depression also underlying. I suggested she have her B12 level checked with her primary care physician next time.   She was hospitalized from 04/21/2013 through 04/26/2013 due to diverticulitis and E. Coli sepsis. She had a head CT without contrast on 07/19/2013 which showed no acute findings but was in keeping with generalized cerebral atrophy and showed white matter changes. I personally reviewed the images through the PACS system. Prior to that she had a head CT on 11/16/2012 which showed no acute findings.    Her Past Medical History Is Significant For: Past Medical History  Diagnosis Date  . Emphysema     followed by Sarah Johnson  . S/P thoracic aortic aneurysm repair 1/10    also with bioprosthetic aortic valve  prelacement [facility]). in 10/10 she has a descending thoraic aorta anuerysm and abdominal reapir [facility]). Vascular urgeon is Dr. Lunette Stands.   . Pulmonary embolism 2010    She is no onger taking coumadin  . Hypothyroidism   . Blind right eye   . Hyperlipidemia   . S/P aortic valve replacement with bioprosthetic valve     ehco (8/11) with EF 55-60%, mild-mod MR, mild-mod TR, poorly visualized bioprosthetic aortic valve but no significant regurgitation and gradient not  significanly elevated.    . CKD (chronic kidney disease)   . HTN (hypertension)   . History of Doppler echocardiogram     carotid dopplers. (8/11) no significant stenosis  . COPD (chronic obstructive pulmonary disease)   . SVT (supraventricular tachycardia)     HOLTER MONITOR AFTER SURGERY   . GERD (gastroesophageal reflux disease)   . Arthritis   . Cataract     RT  . Hx of echocardiogram 2015    Echo (08/2013):  EF 60-65%, no RWMA, Gr 1 DD, AVR ok (mean 5 mmHg), mild MR, PASP 22 mmHg    Her Past Surgical History Is Significant For: Past Surgical History  Procedure Laterality Date  . Thoracoabdominal aortic aneurysm repair  01/28/09    repair  . Anomalous pulmonary venous return repair    . Thoracic aortic aneurysm repair  1/10    VALVE ALSO DONE  . Back surgery  1970   . Eye surgery      AS BABY- RT EYE  POOR SIGHT  . Tonsillectomy    . Lumbar laminectomy/decompression microdiscectomy  05/31/2011    Procedure: LUMBAR LAMINECTOMY/DECOMPRESSION MICRODISCECTOMY;  Surgeon: Elaina Hoops, MD;  Location: [place] [facility];  Service: Neurosurgery;  Laterality: Bilateral;  Bilateral Lumbar three-four decompressionj lumbar laminectomy     Her Family History Is Significant For: Family History  Problem Relation Age of Onset  . Brain cancer Maternal Grandmother   . Coronary artery disease      no premature CAD    Her Social History Is Significant For: Social History   Social History  . Marital Status: Married    Spouse Name: N/A  . Number of Children: 3  . Years of Education: N/A   Occupational History  . Retired    Social History Main Topics  . Smoking status: Former Smoker -- 1.00 packs/day for 20 years    Types: Cigarettes    Quit date: 04/26/2004  . Smokeless tobacco: Never Used     Comment: quit in 2006  . Alcohol Use: No  . Drug Use: No  . Sexual Activity: Yes    Birth Control/ Protection: Post-menopausal   Other Topics Concern  . 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 Codeine Other (See Comments)    Pt. States it has been too long to remember the reaction   . Penicillins Other (See Comments)    Pt. Does not recall the reaction.   :   Her Current Medications Are:  Outpatient Encounter Prescriptions as of 01/22/2015  Medication Sig  . acetaminophen (TYLENOL) 325 MG tablet Take 2 tablets (650 mg total) by mouth every 4 (four) hours as needed for fever, headache or mild pain.  Marland Kitchen amLODipine (NORVASC) 5 MG tablet Take 5 mg by mouth daily.    Marland Kitchen aspirin EC  81 MG tablet Take 1 tablet (81 mg total) by mouth daily.  Marland Kitchen atorvastatin (LIPITOR) 20 MG tablet TAKE 1 TABLET BY MOUTH EVERY DAY  . Calcium Carbonate-Vitamin D (CALCIUM-VITAMIN D) 600-200 MG-UNIT CAPS Take 1 tablet by mouth daily.  Marland Kitchen donepezil (ARICEPT) 10 MG tablet TAKE 1 TABLET (10 MG TOTAL) BY MOUTH AT BEDTIME.  . DULERA 100-5 MCG/ACT AERO INHALE 2 PUFFS INTO THE LUNGS 2 TIMES DAILY. RINSE MOUTH AFTER EACH USE  . ergocalciferol (VITAMIN D2) 50000 UNITS capsule Take 50,000 Units by mouth every 30 (thirty) days.  Marland Kitchen HYDROcodone-acetaminophen (NORCO/VICODIN) 5-325 MG per tablet Take 1 tablet by mouth every 6 (six) hours as needed for severe pain.  Marland Kitchen ibandronate (BONIVA) 150 MG tablet Take 150 mg by mouth every 30 (thirty) days. Take in the morning with a full glass of water, on an empty stomach, and do not take anything else by mouth or lie down for the next 30 min.  Marland Kitchen levothyroxine (SYNTHROID, LEVOTHROID) 112 MCG tablet Take 112 mcg by mouth daily before breakfast.  . metoprolol (LOPRESSOR) 25 MG tablet Take 1 tablet (25 mg total) by mouth 2 (two) times daily.  . Multiple Vitamin (MULITIVITAMIN WITH MINERALS) TABS Take 1 tablet by mouth daily.  Marland Kitchen NAMENDA XR 28 MG CP24 Take 1 tablet by mouth daily.  . potassium chloride SA (K-DUR,KLOR-CON) 20 MEQ  tablet Take 1 tablet (20 mEq total) by mouth daily.  Marland Kitchen PRESCRIPTION MEDICATION Take 1 capsule by mouth as directed. Patient has an Antibiotic prescribed by dentist that patient takes 1 hour before dental work due to artificial heart valves. Unsure of name of antibiotic and can not clarify what it is due to it being an old refill patient keeps at home for dental work.  Marland Kitchen PROAIR HFA 108 (90 BASE) MCG/ACT inhaler Inhale 2 puffs into the lungs every 6 (six) hours as needed. (Patient taking differently: Inhale 2 puffs into the lungs every 6 (six) hours as needed for wheezing or shortness of breath. )  . saccharomyces boulardii (FLORASTOR) 250 MG capsule Take 1 capsule (250 mg total) by mouth 2 (two) times daily.  . sertraline (ZOLOFT) 50 MG tablet Take 50 mg by mouth daily.   . vancomycin (VANCOCIN) 50 mg/mL oral solution Take 2.5 mLs (125 mg total) by mouth every 6 (six) hours. 6 week tapering course of po vanco as follows-  149m qid for 14 days, started on 07/02/14 [id number] for 7 days- start on 3/23 12564mday for 7 days- start on 3/30 125m42md for 14 days- start on 4/6 till 4/19  . white petrolatum (VASELINE) GEL Apply 1 application topically as needed (apply to bottom).  . [DISCONTINUED] DULERA 100-5 MCG/ACT AERO INHALE 2 PUFFS INTO THE LUNGS 2 TIMES DAILY. RINSE MOUTH AFTER EACH USE  . [DISCONTINUED] mometasone-formoterol (DULERA) 100-5 MCG/ACT AERO INHALE 2 PUFFS INTO THE LUNGS 2 (TWO) TIMES DAILY. RINSE MOUTH AFTER EACH USE   No facility-administered encounter medications on file as of 01/22/2015.  :  Review of Systems:  Out of a complete 14 point review of systems, all are reviewed and negative with the exception of these symptoms as listed below:   Review of Systems  Neurological:       Patient is here with daughter. They would like to discuss patient's medications.     Objective:  Neurologic Exam  Physical Exam Physical Examination:   Filed Vitals:   01/22/15 0833  BP: 112/60   Pulse: 60  Resp: 24  General Examination: The patient is a very pleasant 81 y.o. female in no acute distress. She is calm and cooperative with the exam. She denies Auditory Hallucinations and Visual Hallucinations. She is well groomed and situated in a chair. She is minimally verbal.   HEENT: Normocephalic, atraumatic, with the exception of damaged right eye. Left pupil is round and reactive to light and accommodation. Extraocular tracking shows no significant saccadic breakdown on the L without nystagmus noted. Hearing is fairly intact. Face is symmetric with no facial masking and normal facial sensation. There is no lip, neck or jaw tremor. Neck is not rigid with intact passive ROM. There are no carotid bruits on auscultation. Oropharynx exam reveals moderate mouth dryness. No significant airway crowding is noted. Mallampati is class II. Tongue protrudes centrally and palate elevates symmetrically.    Chest: is clear to auscultation without wheezing, rhonchi or crackles noted.  Heart: sounds are regular and normal without murmurs, rubs or gallops noted.   Abdomen: is soft, non-tender and non-distended with normal bowel sounds appreciated on auscultation.  Extremities: There is no pitting edema in the distal lower extremities bilaterally. Pedal pulses are intact.   Skin: is warm and dry with no trophic changes noted. Age-related changes are noted on the skin. Multiple small old bruises of varying ages are noted on the forearms. Of note, She is on baby ASA.  Musculoskeletal: exam reveals no obvious joint deformities, tenderness or joint swelling or erythema. Mild changes consistent with OA of the hands are noted bilaterally.   Neurologically:  Mental status: The patient is awake and alert, paying good  attention. She is able to partially provide the history. Her daughter provides most of the Hx. She is oriented to: person, place, situation, day of week and month of year. Her memory, attention,  language and knowledge are impaired mildly. There is no aphasia, agnosia, apraxia or anomia. There is a mild degree of bradyphrenia. Speech is mildly hypophonic with no dysarthria noted. Mood is congruent and affect is blunted.   On 08/20/13: MMSE (Mini-Mental state exam) score is 26/30. CDT (Clock Drawing Test) score is 4/4. AFT (Animal Fluency Test) score is 3.   On 01/10/2014: MMSE: 25/30, CDT 4/4, AFT 8.   On 01/22/2015: MMSE: 22/30, CDT:3/4, AFT: 8/min, GDS: 1/15.  Cranial nerves are as described above under HEENT exam. In addition, shoulder shrug is normal with equal shoulder height noted.  Motor exam: Normal bulk, and strength for age is noted. Tone is not rigid with absence of cogwheeling in the bilateral extremities. There is overall no significant bradykinesia. There is no drift or rebound. There is no tremor.   Romberg is negative. Reflexes are 1+ in the upper extremities and 1+ in the lower extremities. Toes are downgoing bilaterally. Fine motor skills: Finger taps, hand movements, and rapid alternating patting are mildly impaired bilaterally. Foot taps and foot agility are very mildly impaired bilaterally.   Cerebellar testing shows no dysmetria or intention tremor on finger to nose testing. There is no truncal or gait ataxia.   Sensory exam is intact to light touch in the upper and lower extremities.   Gait, station and balance: She stands up from the seated position with no difficulty and needs no assistance. No veering to one side is noted. No leaning to one side. Posture is mildly stooped, age appropriate. Stance is narrow-based. She turns in in 3 steps. Tandem walk is not possible without help. Balance is very mildly impaired.   Assessment and Plan:  In summary, KENIA TEAGARDEN is a very pleasant 81 year old female with a complex medical history of COPD, prior PE, status post AVR (porcine), hyperlipidemia, hypothyroidism, R eye loss as infant, hypertension, vitamin D deficiency,  status post aortic aneurysm repair (12/2007 and 1/10) and chronic renal insufficiency, s/p back surgery in 2013, who has had memory loss for the past 3+ years. More progressive after hospitalization in 2015. She has been on dual medication for her memory loss. She has evidence of dementia, she may have a combination of Alzheimer's and vascular dementia. She does have vascular risk factors. She has significant COPD and required overnight supplemental oxygen but refused it and had the oxygen picked up in January 2016. She has since then not followed up with her pulmonologist, Sarah Johnson. I reviewed his office note. He recommended that she follow-up in 4 months. She is advised to reschedule this appointment. She is advised to discuss with him the benefits of using oxygen at night. She did have a pulse oximetry test in January 2016 which I reviewed. She is advised to drink more water and reduce her caffeine intake. She is advised to exercise within her limitations. She is advised to continue with Aricept 10 mg generic once daily and Namenda long-acting 28 mg once daily. These are prescribed by her primary care physician currently. I talked to the patient and her daughter at length today. We also talked to her about her driving. I am asking her daughter [name] or Adonis Brook to monitor her driving. Currently she is driving locally only and to familiar places and not at night. She lives alone and a single story home and had to sell her [place] after her husband died in 2014/04/23 and overall has coped well. She denies depression.  We talked about maintaining a healthy lifestyle in general and staying active mentally and physically. I encouraged the patient to eat healthy, exercise daily and keep well hydrated, to keep a scheduled bedtime and wake time routine, to not skip any meals and eat healthy snacks in between meals and to have protein with every meal. I stressed the importance of regular exercise, within of course  the patient's own mobility limitations. I encouraged the patient to keep up with current events by reading the news paper or watching the news and to do word puzzles, or if feasible, to go on [email or web address].   We can consider formal cognitive testing in the form of neuropsychological evaluation down the [place].  As far as medications are concerned, I recommended the following at this time: Continue Aricept to 10 mg once daily and Namenda XR 28 mg once daily. I will see her back routinely in 6 months, sooner if needed.  I answered all their questions today and the patient and her daughter, Sarah Johnson, were in agreement with the above outlined plan.  I spent 40 minutes in total face-to-face time with the patient, more than 50% of which was spent in counseling and coordination of care, reviewing test results, reviewing medication and discussing or reviewing the diagnosis of dementia, its prognosis and treatment options.

## 2015-01-22 NOTE — Patient Instructions (Signed)
I think overall you are doing fairly well but I do want to suggest a few things today:   Remember to drink plenty of fluid, eat healthy meals and do not skip any meals. Try to eat protein with a every meal and eat a healthy snack such as fruit or nuts in between meals. Try to keep a regular sleep-wake schedule and try to exercise daily, particularly in the form of walking, if you can. Good nutrition, proper sleep and exercise can help her cognitive function.   You need to follow up with Dr. Joya Gaskins and perhaps reconsider overnight oxygen, if he recommends.   Engage in social activities in your community and with your family and try to keep up with current events by reading the newspaper or watching the news. If you have computer and can go online, try [email or web address]. Also, you may like to do word finding puzzles or crossword puzzles.  As far as your medications are concerned, I would like to suggest no changes.   I would like to see you back in 6 months, sooner if we need to. Please call us with any interim questions, concerns, problems, updates or refill requests.  Our phone number is [phone]. We also have an after hours call service for urgent matters and there is a physician on-call for urgent questions. For any emergencies you know to call 911 or go to the nearest emergency room.

## 2015-02-04 ENCOUNTER — Emergency Department (HOSPITAL_COMMUNITY): Payer: Medicare Other

## 2015-02-04 ENCOUNTER — Inpatient Hospital Stay (HOSPITAL_COMMUNITY)
Admission: EM | Admit: 2015-02-04 | Discharge: 2015-02-12 | DRG: 871 | Disposition: A | Discharge status: Home Health | Payer: Medicare Other | Attending: Internal Medicine | Admitting: Internal Medicine

## 2015-02-04 ENCOUNTER — Encounter (HOSPITAL_COMMUNITY): Payer: Self-pay | Admitting: Emergency Medicine

## 2015-02-04 ENCOUNTER — Inpatient Hospital Stay (HOSPITAL_COMMUNITY): Payer: Medicare Other

## 2015-02-04 DIAGNOSIS — N189 Chronic kidney disease, unspecified: Secondary | ICD-10-CM

## 2015-02-04 DIAGNOSIS — I129 Hypertensive chronic kidney disease with stage 1 through stage 4 chronic kidney disease, or unspecified chronic kidney disease: Secondary | ICD-10-CM | POA: Diagnosis present

## 2015-02-04 DIAGNOSIS — E039 Hypothyroidism, unspecified: Secondary | ICD-10-CM | POA: Diagnosis present

## 2015-02-04 DIAGNOSIS — E876 Hypokalemia: Secondary | ICD-10-CM | POA: Diagnosis present

## 2015-02-04 DIAGNOSIS — R52 Pain, unspecified: Secondary | ICD-10-CM | POA: Diagnosis not present

## 2015-02-04 DIAGNOSIS — A419 Sepsis, unspecified organism: Principal | ICD-10-CM | POA: Diagnosis present

## 2015-02-04 DIAGNOSIS — F0390 Unspecified dementia without behavioral disturbance: Secondary | ICD-10-CM | POA: Diagnosis present

## 2015-02-04 DIAGNOSIS — A0472 Enterocolitis due to Clostridium difficile, not specified as recurrent: Secondary | ICD-10-CM | POA: Diagnosis present

## 2015-02-04 DIAGNOSIS — A047 Enterocolitis due to Clostridium difficile: Secondary | ICD-10-CM | POA: Diagnosis present

## 2015-02-04 DIAGNOSIS — D72829 Elevated white blood cell count, unspecified: Secondary | ICD-10-CM | POA: Diagnosis present

## 2015-02-04 DIAGNOSIS — R197 Diarrhea, unspecified: Secondary | ICD-10-CM | POA: Diagnosis not present

## 2015-02-04 DIAGNOSIS — M7989 Other specified soft tissue disorders: Secondary | ICD-10-CM | POA: Diagnosis not present

## 2015-02-04 DIAGNOSIS — J189 Pneumonia, unspecified organism: Secondary | ICD-10-CM | POA: Diagnosis present

## 2015-02-04 DIAGNOSIS — R509 Fever, unspecified: Secondary | ICD-10-CM | POA: Diagnosis not present

## 2015-02-04 DIAGNOSIS — Z8249 Family history of ischemic heart disease and other diseases of the circulatory system: Secondary | ICD-10-CM | POA: Diagnosis not present

## 2015-02-04 DIAGNOSIS — M19071 Primary osteoarthritis, right ankle and foot: Secondary | ICD-10-CM | POA: Diagnosis not present

## 2015-02-04 DIAGNOSIS — N179 Acute kidney failure, unspecified: Secondary | ICD-10-CM | POA: Diagnosis present

## 2015-02-04 DIAGNOSIS — M179 Osteoarthritis of knee, unspecified: Secondary | ICD-10-CM | POA: Diagnosis present

## 2015-02-04 DIAGNOSIS — D631 Anemia in chronic kidney disease: Secondary | ICD-10-CM | POA: Diagnosis present

## 2015-02-04 DIAGNOSIS — K219 Gastro-esophageal reflux disease without esophagitis: Secondary | ICD-10-CM | POA: Diagnosis not present

## 2015-02-04 DIAGNOSIS — M25571 Pain in right ankle and joints of right foot: Secondary | ICD-10-CM | POA: Diagnosis not present

## 2015-02-04 DIAGNOSIS — R531 Weakness: Secondary | ICD-10-CM | POA: Diagnosis not present

## 2015-02-04 DIAGNOSIS — Z8719 Personal history of other diseases of the digestive system: Secondary | ICD-10-CM | POA: Diagnosis not present

## 2015-02-04 DIAGNOSIS — D696 Thrombocytopenia, unspecified: Secondary | ICD-10-CM | POA: Diagnosis present

## 2015-02-04 DIAGNOSIS — H5441 Blindness, right eye, normal vision left eye: Secondary | ICD-10-CM | POA: Diagnosis present

## 2015-02-04 DIAGNOSIS — R627 Adult failure to thrive: Secondary | ICD-10-CM

## 2015-02-04 DIAGNOSIS — J181 Lobar pneumonia, unspecified organism: Secondary | ICD-10-CM

## 2015-02-04 DIAGNOSIS — R51 Headache: Secondary | ICD-10-CM | POA: Diagnosis not present

## 2015-02-04 DIAGNOSIS — R6520 Severe sepsis without septic shock: Secondary | ICD-10-CM | POA: Diagnosis present

## 2015-02-04 DIAGNOSIS — Z951 Presence of aortocoronary bypass graft: Secondary | ICD-10-CM

## 2015-02-04 DIAGNOSIS — N182 Chronic kidney disease, stage 2 (mild): Secondary | ICD-10-CM | POA: Diagnosis not present

## 2015-02-04 DIAGNOSIS — R109 Unspecified abdominal pain: Secondary | ICD-10-CM

## 2015-02-04 DIAGNOSIS — Z953 Presence of xenogenic heart valve: Secondary | ICD-10-CM | POA: Diagnosis not present

## 2015-02-04 DIAGNOSIS — J449 Chronic obstructive pulmonary disease, unspecified: Secondary | ICD-10-CM | POA: Diagnosis not present

## 2015-02-04 DIAGNOSIS — E785 Hyperlipidemia, unspecified: Secondary | ICD-10-CM | POA: Diagnosis present

## 2015-02-04 DIAGNOSIS — Z87891 Personal history of nicotine dependence: Secondary | ICD-10-CM | POA: Diagnosis not present

## 2015-02-04 DIAGNOSIS — Z86711 Personal history of pulmonary embolism: Secondary | ICD-10-CM

## 2015-02-04 DIAGNOSIS — N183 Chronic kidney disease, stage 3 (moderate): Secondary | ICD-10-CM

## 2015-02-04 DIAGNOSIS — M25462 Effusion, left knee: Secondary | ICD-10-CM | POA: Diagnosis not present

## 2015-02-04 DIAGNOSIS — J439 Emphysema, unspecified: Secondary | ICD-10-CM | POA: Diagnosis not present

## 2015-02-04 DIAGNOSIS — I82401 Acute embolism and thrombosis of unspecified deep veins of right lower extremity: Secondary | ICD-10-CM | POA: Diagnosis not present

## 2015-02-04 DIAGNOSIS — Z8673 Personal history of transient ischemic attack (TIA), and cerebral infarction without residual deficits: Secondary | ICD-10-CM | POA: Diagnosis not present

## 2015-02-04 DIAGNOSIS — M79671 Pain in right foot: Secondary | ICD-10-CM | POA: Diagnosis not present

## 2015-02-04 DIAGNOSIS — Z9081 Acquired absence of spleen: Secondary | ICD-10-CM | POA: Diagnosis not present

## 2015-02-04 DIAGNOSIS — R519 Headache, unspecified: Secondary | ICD-10-CM

## 2015-02-04 DIAGNOSIS — Z8619 Personal history of other infectious and parasitic diseases: Secondary | ICD-10-CM

## 2015-02-04 DIAGNOSIS — R410 Disorientation, unspecified: Secondary | ICD-10-CM | POA: Diagnosis not present

## 2015-02-04 DIAGNOSIS — I82491 Acute embolism and thrombosis of other specified deep vein of right lower extremity: Secondary | ICD-10-CM | POA: Diagnosis not present

## 2015-02-04 LAB — BLOOD GAS, VENOUS
Acid-base deficit: 1.6 mmol/L (ref 0.0–2.0)
BICARBONATE: 23.7 meq/L (ref 20.0–24.0)
O2 SAT: 75.1 %
PATIENT TEMPERATURE: 98.6
TCO2: 21.5 mmol/L (ref 0–100)
pCO2, Ven: 44.8 mmHg — ABNORMAL LOW (ref 45.0–50.0)
pH, Ven: 7.344 — ABNORMAL HIGH (ref 7.250–7.300)
pO2, Ven: 42.8 mmHg (ref 30.0–45.0)

## 2015-02-04 LAB — I-STAT CG4 LACTIC ACID, ED
LACTIC ACID, VENOUS: 2.36 mmol/L — AB (ref 0.5–2.0)
Lactic Acid, Venous: 4.27 mmol/L (ref 0.5–2.0)

## 2015-02-04 LAB — CBC WITH DIFFERENTIAL/PLATELET
BASOS ABS: 0 10*3/uL (ref 0.0–0.1)
BASOS ABS: 0 10*3/uL (ref 0.0–0.1)
BASOS PCT: 0 %
BASOS PCT: 0 %
EOS ABS: 0 10*3/uL (ref 0.0–0.7)
EOS ABS: 0 10*3/uL (ref 0.0–0.7)
Eosinophils Relative: 0 %
Eosinophils Relative: 0 %
HCT: 40.6 % (ref 36.0–46.0)
HEMATOCRIT: 33.5 % — AB (ref 36.0–46.0)
HEMOGLOBIN: 13 g/dL (ref 12.0–15.0)
HEMOGLOBIN: 10.7 g/dL — AB (ref 12.0–15.0)
LYMPHS PCT: 12 %
Lymphocytes Relative: 10 %
Lymphs Abs: 3.2 10*3/uL (ref 0.7–4.0)
Lymphs Abs: 2.1 10*3/uL (ref 0.7–4.0)
MCH: 28.6 pg (ref 26.0–34.0)
MCH: 28.7 pg (ref 26.0–34.0)
MCHC: 32 g/dL (ref 30.0–36.0)
MCHC: 31.9 g/dL (ref 30.0–36.0)
MCV: 89.2 fL (ref 78.0–100.0)
MCV: 89.8 fL (ref 78.0–100.0)
MONO ABS: 1.8 10*3/uL — AB (ref 0.1–1.0)
MONOS PCT: 8 %
Monocytes Absolute: 1.7 10*3/uL — ABNORMAL HIGH (ref 0.1–1.0)
Monocytes Relative: 7 %
NEUTROS ABS: 18.2 10*3/uL — AB (ref 1.7–7.7)
NEUTROS PCT: 81 %
NEUTROS PCT: 82 %
Neutro Abs: 21.4 10*3/uL — ABNORMAL HIGH (ref 1.7–7.7)
PLATELETS: 128 10*3/uL — AB (ref 150–400)
Platelets: 165 10*3/uL (ref 150–400)
RBC: 4.55 MIL/uL (ref 3.87–5.11)
RBC: 3.73 MIL/uL — AB (ref 3.87–5.11)
RDW: 16.3 % — ABNORMAL HIGH (ref 11.5–15.5)
RDW: 16.5 % — ABNORMAL HIGH (ref 11.5–15.5)
WBC: 26.4 10*3/uL — ABNORMAL HIGH (ref 4.0–10.5)
WBC: 22 10*3/uL — AB (ref 4.0–10.5)

## 2015-02-04 LAB — COMPREHENSIVE METABOLIC PANEL
ALBUMIN: 4 g/dL (ref 3.5–5.0)
ALBUMIN: 3.1 g/dL — AB (ref 3.5–5.0)
ALK PHOS: 76 U/L (ref 38–126)
ALK PHOS: 58 U/L (ref 38–126)
ALT: 19 U/L (ref 14–54)
ALT: 15 U/L (ref 14–54)
ANION GAP: 10 (ref 5–15)
AST: 34 U/L (ref 15–41)
AST: 31 U/L (ref 15–41)
Anion gap: 11 (ref 5–15)
BILIRUBIN TOTAL: 0.2 mg/dL — AB (ref 0.3–1.2)
BUN: 20 mg/dL (ref 6–20)
BUN: 20 mg/dL (ref 6–20)
CALCIUM: 8.1 mg/dL — AB (ref 8.9–10.3)
CHLORIDE: 105 mmol/L (ref 101–111)
CO2: 22 mmol/L (ref 22–32)
CO2: 20 mmol/L — AB (ref 22–32)
Calcium: 9.7 mg/dL (ref 8.9–10.3)
Chloride: 107 mmol/L (ref 101–111)
Creatinine, Ser: 1.45 mg/dL — ABNORMAL HIGH (ref 0.44–1.00)
Creatinine, Ser: 1.49 mg/dL — ABNORMAL HIGH (ref 0.44–1.00)
GFR calc Af Amer: 37 mL/min — ABNORMAL LOW (ref >60)
GFR calc non Af Amer: 33 mL/min — ABNORMAL LOW (ref >60)
GFR calc non Af Amer: 32 mL/min — ABNORMAL LOW (ref >60)
GFR, EST AFRICAN AMERICAN: 38 mL/min — AB (ref >60)
GLUCOSE: 126 mg/dL — AB (ref 65–99)
GLUCOSE: 139 mg/dL — AB (ref 65–99)
Potassium: 3.9 mmol/L (ref 3.5–5.1)
Potassium: 3 mmol/L — ABNORMAL LOW (ref 3.5–5.1)
SODIUM: 138 mmol/L (ref 135–145)
SODIUM: 137 mmol/L (ref 135–145)
TOTAL PROTEIN: 6.3 g/dL — AB (ref 6.5–8.1)
Total Bilirubin: 0.9 mg/dL (ref 0.3–1.2)
Total Protein: 7.6 g/dL (ref 6.5–8.1)

## 2015-02-04 LAB — URINALYSIS, ROUTINE W REFLEX MICROSCOPIC
Bilirubin Urine: NEGATIVE
Glucose, UA: NEGATIVE mg/dL
Hgb urine dipstick: NEGATIVE
KETONES UR: NEGATIVE mg/dL
LEUKOCYTES UA: NEGATIVE
NITRITE: NEGATIVE
PROTEIN: NEGATIVE mg/dL
Specific Gravity, Urine: 1.019 (ref 1.005–1.030)
Urobilinogen, UA: 0.2 mg/dL (ref 0.0–1.0)
pH: 5.5 (ref 5.0–8.0)

## 2015-02-04 LAB — LACTIC ACID, PLASMA: Lactic Acid, Venous: 4.5 mmol/L (ref 0.5–2.0)

## 2015-02-04 LAB — C DIFFICILE QUICK SCREEN W PCR REFLEX
C DIFFICILE (CDIFF) TOXIN: NEGATIVE
C Diff antigen: POSITIVE — AB

## 2015-02-04 LAB — TSH: TSH: 3.51 u[IU]/mL (ref 0.350–4.500)

## 2015-02-04 LAB — I-STAT TROPONIN, ED: Troponin i, poc: 0.01 ng/mL (ref 0.00–0.08)

## 2015-02-04 LAB — APTT: aPTT: 28 seconds (ref 24–37)

## 2015-02-04 LAB — PROCALCITONIN: Procalcitonin: 0.12 ng/mL

## 2015-02-04 LAB — MAGNESIUM: MAGNESIUM: 1.3 mg/dL — AB (ref 1.7–2.4)

## 2015-02-04 LAB — PROTIME-INR
INR: 1.1 (ref 0.00–1.49)
PROTHROMBIN TIME: 14.4 s (ref 11.6–15.2)

## 2015-02-04 LAB — LIPASE, BLOOD: Lipase: 29 U/L (ref 22–51)

## 2015-02-04 MED ORDER — IPRATROPIUM-ALBUTEROL 0.5-2.5 (3) MG/3ML IN SOLN: 3.0000 mL | RESPIRATORY_TRACT | Status: DC

## 2015-02-04 MED ORDER — ALBUTEROL SULFATE (2.5 MG/3ML) 0.083% IN NEBU: 3.0000 mL | INHALATION_SOLUTION | Freq: Four times a day (QID) | RESPIRATORY_TRACT | Status: DC | PRN

## 2015-02-04 MED ORDER — DEXTROSE 5 % IV SOLN
500.0000 mg | Freq: Once | INTRAVENOUS | Status: DC
  Administered 2015-02-04: 500 mg via INTRAVENOUS
  Filled 2015-02-04: qty 500

## 2015-02-04 MED ORDER — ADULT MULTIVITAMIN W/MINERALS CH
1.0000 | ORAL_TABLET | Freq: Every day | ORAL | Status: DC
  Administered 2015-02-04 – 2015-02-12 (×9): 1 via ORAL
  Filled 2015-02-04 (×9): qty 1

## 2015-02-04 MED ORDER — VANCOMYCIN 50 MG/ML ORAL SOLUTION
125.0000 mg | Freq: Four times a day (QID) | ORAL | Status: DC
  Administered 2015-02-04: 125 mg via ORAL
  Filled 2015-02-04 (×3): qty 2.5

## 2015-02-04 MED ORDER — LEVOFLOXACIN IN D5W 750 MG/150ML IV SOLN: 750.0000 mg | INTRAVENOUS | Status: DC

## 2015-02-04 MED ORDER — SODIUM CHLORIDE 0.9 % IV BOLUS (SEPSIS)
1000.0000 mL | Freq: Once | INTRAVENOUS | Status: AC
  Administered 2015-02-04: 1000 mL via INTRAVENOUS

## 2015-02-04 MED ORDER — DONEPEZIL HCL 5 MG PO TABS
10.0000 mg | ORAL_TABLET | Freq: Every day | ORAL | Status: DC
  Administered 2015-02-04 – 2015-02-11 (×8): 10 mg via ORAL
  Filled 2015-02-04 (×9): qty 2

## 2015-02-04 MED ORDER — ACETAMINOPHEN 325 MG PO TABS
650.0000 mg | ORAL_TABLET | Freq: Once | ORAL | Status: AC
  Administered 2015-02-04: 650 mg via ORAL
  Filled 2015-02-04: qty 2

## 2015-02-04 MED ORDER — VANCOMYCIN 50 MG/ML ORAL SOLUTION
125.0000 mg | Freq: Four times a day (QID) | ORAL | Status: DC
  Administered 2015-02-04 – 2015-02-10 (×24): 125 mg via ORAL
  Filled 2015-02-04 (×28): qty 2.5

## 2015-02-04 MED ORDER — SODIUM CHLORIDE 0.9 % IJ SOLN
3.0000 mL | Freq: Two times a day (BID) | INTRAMUSCULAR | Status: DC
  Administered 2015-02-06 – 2015-02-12 (×12): 3 mL via INTRAVENOUS

## 2015-02-04 MED ORDER — ACETAMINOPHEN 325 MG PO TABS
650.0000 mg | ORAL_TABLET | Freq: Four times a day (QID) | ORAL | Status: DC | PRN
  Administered 2015-02-06 – 2015-02-10 (×10): 650 mg via ORAL
  Filled 2015-02-04 (×10): qty 2

## 2015-02-04 MED ORDER — CALCIUM-VITAMIN D 600-200 MG-UNIT PO CAPS: 1.0000 | ORAL_CAPSULE | Freq: Every day | ORAL | Status: DC

## 2015-02-04 MED ORDER — VITAMIN D (ERGOCALCIFEROL) 1.25 MG (50000 UNIT) PO CAPS: 50000.0000 [IU] | ORAL_CAPSULE | ORAL | Status: DC

## 2015-02-04 MED ORDER — ADULT MULTIVITAMIN W/MINERALS CH: 1.0000 | ORAL_TABLET | Freq: Every day | ORAL | Status: DC

## 2015-02-04 MED ORDER — SODIUM CHLORIDE 0.9 % IV SOLN
INTRAVENOUS | Status: DC
  Administered 2015-02-04 – 2015-02-06 (×2): via INTRAVENOUS

## 2015-02-04 MED ORDER — DEXTROSE 5 % IV SOLN
1.0000 g | Freq: Once | INTRAVENOUS | Status: AC
  Administered 2015-02-04: 1 g via INTRAVENOUS
  Filled 2015-02-04: qty 10

## 2015-02-04 MED ORDER — ONDANSETRON HCL 4 MG/2ML IJ SOLN: 4.0000 mg | Freq: Four times a day (QID) | INTRAMUSCULAR | Status: DC | PRN

## 2015-02-04 MED ORDER — METRONIDAZOLE IN NACL 5-0.79 MG/ML-% IV SOLN
500.0000 mg | Freq: Three times a day (TID) | INTRAVENOUS | Status: DC
  Administered 2015-02-05 (×2): 500 mg via INTRAVENOUS
  Filled 2015-02-04 (×2): qty 100

## 2015-02-04 MED ORDER — VITAMIN B-1 100 MG PO TABS
100.0000 mg | ORAL_TABLET | Freq: Every day | ORAL | Status: DC
  Administered 2015-02-04 – 2015-02-12 (×9): 100 mg via ORAL
  Filled 2015-02-04 (×9): qty 1

## 2015-02-04 MED ORDER — IPRATROPIUM BROMIDE 0.02 % IN SOLN
0.5000 mg | Freq: Once | RESPIRATORY_TRACT | Status: AC
  Administered 2015-02-04: 0.5 mg via RESPIRATORY_TRACT
  Filled 2015-02-04: qty 2.5

## 2015-02-04 MED ORDER — LEVOTHYROXINE SODIUM 112 MCG PO TABS
112.0000 ug | ORAL_TABLET | Freq: Every day | ORAL | Status: DC
  Administered 2015-02-05 – 2015-02-12 (×8): 112 ug via ORAL
  Filled 2015-02-04 (×8): qty 1

## 2015-02-04 MED ORDER — METRONIDAZOLE IN NACL 5-0.79 MG/ML-% IV SOLN
500.0000 mg | Freq: Once | INTRAVENOUS | Status: AC
  Administered 2015-02-04: 500 mg via INTRAVENOUS
  Filled 2015-02-04: qty 100

## 2015-02-04 MED ORDER — MOMETASONE FURO-FORMOTEROL FUM 100-5 MCG/ACT IN AERO
2.0000 | INHALATION_SPRAY | Freq: Two times a day (BID) | RESPIRATORY_TRACT | Status: DC
  Administered 2015-02-05 – 2015-02-12 (×13): 2 via RESPIRATORY_TRACT
  Filled 2015-02-04 (×2): qty 8.8

## 2015-02-04 MED ORDER — FOLIC ACID 1 MG PO TABS
1.0000 mg | ORAL_TABLET | Freq: Every day | ORAL | Status: DC
  Administered 2015-02-04 – 2015-02-12 (×9): 1 mg via ORAL
  Filled 2015-02-04 (×9): qty 1

## 2015-02-04 MED ORDER — POTASSIUM CHLORIDE CRYS ER 20 MEQ PO TBCR
30.0000 meq | EXTENDED_RELEASE_TABLET | ORAL | Status: AC
  Administered 2015-02-04 – 2015-02-05 (×2): 30 meq via ORAL
  Filled 2015-02-04 (×4): qty 1

## 2015-02-04 MED ORDER — ATORVASTATIN CALCIUM 10 MG PO TABS
20.0000 mg | ORAL_TABLET | Freq: Every day | ORAL | Status: DC
  Administered 2015-02-04 – 2015-02-12 (×9): 20 mg via ORAL
  Filled 2015-02-04 (×9): qty 2

## 2015-02-04 MED ORDER — MEMANTINE HCL ER 28 MG PO CP24
28.0000 mg | ORAL_CAPSULE | Freq: Every day | ORAL | Status: DC
  Administered 2015-02-04 – 2015-02-12 (×9): 28 mg via ORAL
  Filled 2015-02-04 (×9): qty 1

## 2015-02-04 MED ORDER — IBANDRONATE SODIUM 150 MG PO TABS: 150.0000 mg | ORAL_TABLET | ORAL | Status: DC

## 2015-02-04 MED ORDER — ASPIRIN EC 81 MG PO TBEC
81.0000 mg | DELAYED_RELEASE_TABLET | Freq: Every day | ORAL | Status: DC
  Administered 2015-02-04 – 2015-02-11 (×8): 81 mg via ORAL
  Filled 2015-02-04 (×8): qty 1

## 2015-02-04 MED ORDER — CALCIUM CARBONATE-VITAMIN D 500-200 MG-UNIT PO TABS
1.0000 | ORAL_TABLET | Freq: Every day | ORAL | Status: DC
  Administered 2015-02-05 – 2015-02-12 (×7): 1 via ORAL
  Filled 2015-02-04 (×8): qty 1

## 2015-02-04 MED ORDER — HEPARIN SODIUM (PORCINE) 5000 UNIT/ML IJ SOLN
5000.0000 [IU] | Freq: Three times a day (TID) | INTRAMUSCULAR | Status: DC
  Administered 2015-02-04 – 2015-02-07 (×9): 5000 [IU] via SUBCUTANEOUS
  Filled 2015-02-04 (×9): qty 1

## 2015-02-04 MED ORDER — ONDANSETRON HCL 4 MG PO TABS: 4.0000 mg | ORAL_TABLET | Freq: Four times a day (QID) | ORAL | Status: DC | PRN

## 2015-02-04 MED ORDER — SODIUM CHLORIDE 0.9 % IV BOLUS (SEPSIS)
1000.0000 mL | INTRAVENOUS | Status: AC
  Administered 2015-02-04 (×2): 1000 mL via INTRAVENOUS

## 2015-02-04 MED ORDER — ACETAMINOPHEN 650 MG RE SUPP: 650.0000 mg | Freq: Four times a day (QID) | RECTAL | Status: DC | PRN

## 2015-02-04 MED ORDER — SERTRALINE HCL 25 MG PO TABS
25.0000 mg | ORAL_TABLET | Freq: Every day | ORAL | Status: DC
  Administered 2015-02-04 – 2015-02-12 (×9): 25 mg via ORAL
  Filled 2015-02-04 (×9): qty 1

## 2015-02-04 MED ORDER — LEVOFLOXACIN IN D5W 750 MG/150ML IV SOLN: 750.0000 mg | Freq: Once | INTRAVENOUS | Status: DC

## 2015-02-04 MED ORDER — SODIUM CHLORIDE 0.9 % IV BOLUS (SEPSIS)
500.0000 mL | Freq: Once | INTRAVENOUS | Status: AC
  Administered 2015-02-04: 500 mL via INTRAVENOUS

## 2015-02-04 MED ORDER — SACCHAROMYCES BOULARDII 250 MG PO CAPS
250.0000 mg | ORAL_CAPSULE | Freq: Two times a day (BID) | ORAL | Status: DC
  Administered 2015-02-04 – 2015-02-12 (×16): 250 mg via ORAL
  Filled 2015-02-04 (×16): qty 1

## 2015-02-04 MED ORDER — ALBUTEROL (5 MG/ML) CONTINUOUS INHALATION SOLN
10.0000 mg/h | INHALATION_SOLUTION | Freq: Once | RESPIRATORY_TRACT | Status: AC
  Administered 2015-02-04: 10 mg/h via RESPIRATORY_TRACT
  Filled 2015-02-04: qty 20

## 2015-02-04 NOTE — ED Notes (Signed)
Bed: RESA Expected date:  Expected time:  Means of arrival:  Comments: EMS/11F/fever/resp. distress

## 2015-02-04 NOTE — Progress Notes (Signed)
PHARMACIST - PHYSICIAN COMMUNICATION  CONCERNING: P&T Medication Policy Regarding Oral Bisphosphonates  RECOMMENDATION: Your order for alendronate (Fosamax), ibandronate (Boniva), or risedronate (Actonel) has been discontinued at this time.  If the patient's post-hospital medical condition warrants safe use of this class of drugs, please resume the pre-hospital regimen upon discharge.  DESCRIPTION:  Alendronate (Fosamax), ibandronate (Boniva), and risedronate (Actonel) can cause severe esophageal erosions in patients who are unable to remain upright at least 30 minutes after taking this medication.   Since brief interruptions in therapy are thought to have minimal impact on bone mineral density, the [facility] has established that bisphosphonate orders should be routinely discontinued during hospitalization.   To override this safety policy and permit administration of Boniva, Fosamax, or Actonel in the hospital, prescribers must write "DO NOT HOLD" in the comments section when placing the order for this class of medications.   Reuel Boom, PharmD, BCPS Pager: [phone] 02/04/2015, 10:03 PM

## 2015-02-04 NOTE — ED Notes (Signed)
Patient transported to X-ray 

## 2015-02-04 NOTE — Progress Notes (Signed)
ANTIBIOTIC CONSULT NOTE - INITIAL  Pharmacy Consult for Levofloxacin, Metronidazole, Vanc PO Indication: Bacterial vs C diff colitis  Allergies  Allergen Reactions  . Codeine Other (See Comments)    Pt. States it has been too long to remember the reaction   . Penicillins Other (See Comments)    Pt. Does not recall the reaction.  Has patient had a PCN reaction causing immediate rash, facial/tongue/throat swelling, SOB or lightheadedness with hypotension:  Has patient had a PCN reaction causing severe rash involving mucus membranes or skin necrosis: Has patient had a PCN reaction that required hospitalization  Has patient had a PCN reaction occurring within the last 10 years:  If all of the above answers are "NO", then may proceed with Cephalosporin use.     Patient Measurements: Height: 5\' 2"  (157.5 cm) Weight: 143 lb 14.4 oz (65.273 kg) IBW/kg (Calculated) : 50.1  Vital Signs: Temp: 98.6 F (37 C) (10/11 2050) Temp Source: Oral (10/11 2050) BP: 90/42 mmHg (10/11 2013) Pulse Rate: 80 (10/11 2013) Intake/Output from previous day:   Intake/Output from this shift:    Labs:  Recent Labs  02/04/15 1708  WBC 26.4*  HGB 13.0  PLT 128*  CREATININE 1.45*   Estimated Creatinine Clearance: 27.5 mL/min (by C-G formula based on Cr of 1.45). No results for input(s): VANCOTROUGH, VANCOPEAK, VANCORANDOM, GENTTROUGH, GENTPEAK, GENTRANDOM, TOBRATROUGH, TOBRAPEAK, TOBRARND, AMIKACINPEAK, AMIKACINTROU, AMIKACIN in the last 72 hours.   Microbiology: Recent Results (from the past 720 hour(s))  C difficile quick scan w PCR reflex     Status: Abnormal   Collection Time: 02/04/15  6:43 PM  Result Value Ref Range Status   C Diff antigen POSITIVE (A) NEGATIVE Final   C Diff toxin NEGATIVE NEGATIVE Final   C Diff interpretation   Final    C. difficile present, but toxin not detected. This indicates colonization. In most cases, this does not require treatment. If patient has signs and  symptoms consistent with colitis, consider treatment. Requires ENTERIC precautions.    Medical History: Past Medical History  Diagnosis Date  . Emphysema     followed by Dr. Joya Gaskins  . S/P thoracic aortic aneurysm repair 1/10    also with bioprosthetic aortic valve prelacement [facility]). in 10/10 she has a descending thoraic aorta anuerysm and abdominal reapir [facility]). Vascular urgeon is Dr. Lunette Stands.   . Pulmonary embolism ([place]) 2010    She is no onger taking coumadin  . Hypothyroidism   . Blind right eye   . Hyperlipidemia   . S/P aortic valve replacement with bioprosthetic valve     ehco (8/11) with EF 55-60%, mild-mod MR, mild-mod TR, poorly visualized bioprosthetic aortic valve but no significant regurgitation and gradient not significanly elevated.    . CKD (chronic kidney disease)   . HTN (hypertension)   . History of Doppler echocardiogram     carotid dopplers. (8/11) no significant stenosis  . COPD (chronic obstructive pulmonary disease) ([place])   . SVT (supraventricular tachycardia) (HCC)     HOLTER MONITOR AFTER SURGERY   . GERD (gastroesophageal reflux disease)   . Arthritis   . Cataract     RT  . Hx of echocardiogram 2015    Echo (08/2013):  EF 60-65%, no RWMA, Gr 1 DD, AVR ok (mean 5 mmHg), mild MR, PASP 22 mmHg    Medications:  Anti-infectives    Start     Dose/Rate Route Frequency Ordered Stop   02/05/15 2000  levofloxacin (LEVAQUIN) IVPB 750  mg     750 mg 100 mL/hr over 90 Minutes Intravenous Every 48 hours 02/04/15 2135     02/05/15 0600  metroNIDAZOLE (FLAGYL) IVPB 500 mg     500 mg 100 mL/hr over 60 Minutes Intravenous 3 times per day 02/04/15 2135     02/04/15 2200  metroNIDAZOLE (FLAGYL) IVPB 500 mg     500 mg 100 mL/hr over 60 Minutes Intravenous  Once 02/04/15 2048     02/04/15 2200  vancomycin (VANCOCIN) 50 mg/mL oral solution 125 mg     125 mg Oral 4 times per day 02/04/15 2135     02/04/15 2100  levofloxacin (LEVAQUIN) IVPB 750 mg  Status:   Discontinued     750 mg 100 mL/hr over 90 Minutes Intravenous  Once 02/04/15 2048 02/04/15 2135   02/04/15 1830  vancomycin (VANCOCIN) 50 mg/mL oral solution 125 mg  Status:  Discontinued     125 mg Oral 4 times per day 02/04/15 1827 02/04/15 2048   02/04/15 1830  azithromycin (ZITHROMAX) 500 mg in dextrose 5 % 250 mL IVPB  Status:  Discontinued     500 mg 250 mL/hr over 60 Minutes Intravenous  Once 02/04/15 1827 02/04/15 2048   02/04/15 1830  cefTRIAXone (ROCEPHIN) 1 g in dextrose 5 % 50 mL IVPB     1 g 100 mL/hr over 30 Minutes Intravenous  Once 02/04/15 1828 02/04/15 1953     Assessment: 81 y.o. female with history of COPD, splenectomy, and recurrent C diff infections in Dec 2015 and Mar 2016  presents with fevers and diarrhea.  She has had similar presentation in the past with C diff colitis so they were concerned for another exacerbation. She states that the diarrhea was stringy and foul smelling.  CXR shows atx vs PNA, and initially also suspected CAP.  Admitting for sepsis d/t bacterial vs C diff colitis.  Lactate markedly elevated on admission.  Pharmacy to dose levaquin/Flagyl and PO vancomycin.  10/11 >> CTX/Zmax x 1 10/11 >> vancomycin PO >>   10/11 >> Flagyl >>   10/12 >> Levofloxacin >>    10/11 blood: IP 10/11 C diff: Antigen +, Tox -  Tmax 100.4 WBC markedly elevated Renal: SCr elevated; baseline appears to be ~1.2; CrCl 28 CG   Goal of Therapy:  Eradication of infection Appropriate antibiotic dosing for indication and renal function  Plan:  Day 1 antibiotics  Levofloxacin 750 mg IV q48 hrs.  Will delay first dose until tomorrow as already received Rocephin/Zithromax tonight  Metronidazole 500 mg IV q8 hrs  Vancomycin 125 mg PO q6 hrs  Follow clinical course, renal function, culture results as available  Follow for de-escalation of antibiotics and LOT  C diff status unclear; appears to be colonized but with s/s of infection.  Since fluoroquinolones and  other broad spectrum antibiotics can prolong recovery from C diff infection, would encourage stopping Levaquin promptly if non-C diff causes of diarrhea are ruled out.   Reuel Boom, PharmD, BCPS Pager: [phone] 02/04/2015, 9:39 PM

## 2015-02-04 NOTE — ED Provider Notes (Signed)
CSN: [id number]     Arrival date & time 02/04/15  1652 History   First MD Initiated Contact with Patient 02/04/15 1654     Chief Complaint  Patient presents with  . Fever  . Diarrhea  . Shortness of Breath     (Consider location/radiation/quality/duration/timing/severity/associated sxs/prior Treatment) HPI  81 year old female who presents with diarrhea and fever. History of COPD/emphysema, who is not compliant with home oxygen. Prior history of PE not on anticoagulation, thoracic aortic aneurysm repair with porcine aortic valve replacement, CK D, and hypertension. Also history of recurrent C. difficile colitis, last hospitalized in March. History is provided by the patient and her daughters who reports that she felt unwell today and had multiple episodes of loose diarrhea. States that this is consistent with prior C. difficile colitis and subsequently came to the ED for evaluation. Denies any abdominal pain, nausea, vomiting, chest pain. Does note some shortness of breath, but denies any significant cough or sputum production. Denies dysuria or urinary frequency.  Past Medical History  Diagnosis Date  . Emphysema     followed by Dr. Joya Gaskins  . S/P thoracic aortic aneurysm repair 1/10    also with bioprosthetic aortic valve prelacement [facility]). in 10/10 she has a descending thoraic aorta anuerysm and abdominal reapir [facility]). Vascular urgeon is Dr. Lunette Stands.   . Pulmonary embolism ([place]) 2010    She is no onger taking coumadin  . Hypothyroidism   . Blind right eye   . Hyperlipidemia   . S/P aortic valve replacement with bioprosthetic valve     ehco (8/11) with EF 55-60%, mild-mod MR, mild-mod TR, poorly visualized bioprosthetic aortic valve but no significant regurgitation and gradient not significanly elevated.    . CKD (chronic kidney disease)   . HTN (hypertension)   . History of Doppler echocardiogram     carotid dopplers. (8/11) no significant stenosis  . COPD (chronic  obstructive pulmonary disease) ([place])   . SVT (supraventricular tachycardia) (HCC)     HOLTER MONITOR AFTER SURGERY   . GERD (gastroesophageal reflux disease)   . Arthritis   . Cataract     RT  . Hx of echocardiogram 2015    Echo (08/2013):  EF 60-65%, no RWMA, Gr 1 DD, AVR ok (mean 5 mmHg), mild MR, PASP 22 mmHg   Past Surgical History  Procedure Laterality Date  . Thoracoabdominal aortic aneurysm repair  01/28/09    repair  . Anomalous pulmonary venous return repair    . Thoracic aortic aneurysm repair  1/10    VALVE ALSO DONE  . Back surgery  1970   . Eye surgery      AS BABY- RT EYE  POOR SIGHT  . Tonsillectomy    . Lumbar laminectomy/decompression microdiscectomy  05/31/2011    Procedure: LUMBAR LAMINECTOMY/DECOMPRESSION MICRODISCECTOMY;  Surgeon: Elaina Hoops, MD;  Location: [facility];  Service: Neurosurgery;  Laterality: Bilateral;  Bilateral Lumbar three-four decompressionj lumbar laminectomy    Family History  Problem Relation Age of Onset  . Brain cancer Maternal Grandmother   . Coronary artery disease      no premature CAD   Social History  Substance Use Topics  . Smoking status: Former Smoker -- 1.00 packs/day for 20 years    Types: Cigarettes    Quit date: 04/26/2004  . Smokeless tobacco: Never Used     Comment: quit in 2006  . Alcohol Use: No   OB History    No data available  Review of Systems 10/14 systems reviewed and are negative other than those stated in the HPI   Allergies  Codeine and Penicillins  Home Medications   Prior to Admission medications   Medication Sig Start Date End Date Taking? Authorizing Provider  amLODipine (NORVASC) 5 MG tablet Take 5 mg by mouth daily. 01/16/15  Yes Historical Provider, MD  aspirin EC 81 MG tablet Take 1 tablet (81 mg total) by mouth daily. 05/04/11  Yes Larey Dresser, MD  atorvastatin (LIPITOR) 20 MG tablet TAKE 1 TABLET BY MOUTH EVERY DAY 09/20/14  Yes Larey Dresser, MD  Calcium Carbonate-Vitamin D  (CALCIUM-VITAMIN D) 600-200 MG-UNIT CAPS Take 1 tablet by mouth daily.   Yes Historical Provider, MD  donepezil (ARICEPT) 10 MG tablet TAKE 1 TABLET (10 MG TOTAL) BY MOUTH AT BEDTIME. 10/19/14  Yes Star Age, MD  DULERA 100-5 MCG/ACT AERO INHALE 2 PUFFS INTO THE LUNGS 2 TIMES DAILY. RINSE MOUTH AFTER EACH USE 07/16/14  Yes Elsie Stain, MD  ergocalciferol (VITAMIN D2) 50000 UNITS capsule Take 50,000 Units by mouth every 30 (thirty) days.   Yes Historical Provider, MD  ibandronate (BONIVA) 150 MG tablet Take 150 mg by mouth every 30 (thirty) days. Take in the morning with a full glass of water, on an empty stomach, and do not take anything else by mouth or lie down for the next 30 min.   Yes Historical Provider, MD  ibuprofen (ADVIL,MOTRIN) 200 MG tablet Take 400 mg by mouth daily as needed for moderate pain.   Yes Historical Provider, MD  levothyroxine (SYNTHROID, LEVOTHROID) 112 MCG tablet Take 112 mcg by mouth daily. 12/24/14  Yes Historical Provider, MD  metoprolol (LOPRESSOR) 25 MG tablet Take 1 tablet (25 mg total) by mouth 2 (two) times daily. 03/31/14  Yes Theodis Blaze, MD  Multiple Vitamin (MULITIVITAMIN WITH MINERALS) TABS Take 1 tablet by mouth daily.   Yes Historical Provider, MD  NAMENDA XR 28 MG CP24 Take 1 tablet by mouth daily. 07/17/13  Yes Historical Provider, MD  PROAIR HFA 108 (90 BASE) MCG/ACT inhaler Inhale 2 puffs into the lungs every 6 (six) hours as needed. Patient taking differently: Inhale 2 puffs into the lungs every 6 (six) hours as needed for wheezing or shortness of breath.  05/08/14  Yes Elsie Stain, MD  saccharomyces boulardii (FLORASTOR) 250 MG capsule Take 1 capsule (250 mg total) by mouth 2 (two) times daily. 03/29/14  Yes Robbie Lis, MD  sertraline (ZOLOFT) 50 MG tablet Take 25 mg by mouth daily.  07/19/13  Yes Historical Provider, MD   BP 99/52 mmHg  Pulse 94  Temp(Src) 100.4 F (38 C) (Rectal)  Resp 26  SpO2 97% Physical Exam Physical Exam  Nursing  note and vitals reviewed. Constitutional: elderly woman, appears unwell but is not toxic and in no acute distress Head: Normocephalic and atraumatic.  Mouth/Throat: Oropharynx is clear and dry.  Neck: Normal range of motion. Neck supple.  Cardiovascular: Normal rate and regular rhythm.   No edema. Pulmonary/Chest:tachypnea. No conversational dyspnea. Diffuse expiratory wheezing, with overall poor air movement. Abdominal: Soft. Mild distension. There is no tenderness. There is no rebound and no guarding.  Musculoskeletal: Normal range of motion.  Neurological: Alert, no facial droop, fluent speech, moves all extremities symmetrically Skin: Skin is warm and dry.  Psychiatric: Cooperative  ED Course  Procedures (including critical care time) Labs Review Labs Reviewed  CBC WITH DIFFERENTIAL/PLATELET - Abnormal; Notable for the following:  WBC 26.4 (*)    RDW 16.3 (*)    Platelets 128 (*)    Neutro Abs 21.4 (*)    Monocytes Absolute 1.8 (*)    All other components within normal limits  COMPREHENSIVE METABOLIC PANEL - Abnormal; Notable for the following:    Glucose, Bld 126 (*)    Creatinine, Ser 1.45 (*)    GFR calc non Af Amer 33 (*)    GFR calc Af Amer 38 (*)    All other components within normal limits  BLOOD GAS, VENOUS - Abnormal; Notable for the following:    pH, Ven 7.344 (*)    pCO2, Ven 44.8 (*)    All other components within normal limits  I-STAT CG4 LACTIC ACID, ED - Abnormal; Notable for the following:    Lactic Acid, Venous 2.36 (*)    All other components within normal limits  CULTURE, BLOOD (ROUTINE X 2)  CULTURE, BLOOD (ROUTINE X 2)  C DIFFICILE QUICK SCREEN W PCR REFLEX  LIPASE, BLOOD  URINALYSIS, ROUTINE W REFLEX MICROSCOPIC (NOT AT [facility])  Randolm Idol, ED    Imaging Review Dg Chest 2 View  02/04/2015   CLINICAL DATA:  Fever, COPD  EXAM: CHEST  2 VIEW  COMPARISON:  06/28/2014  FINDINGS: There is mild bilateral interstitial thickening. Hazy left  lower lobe airspace disease which may reflect atelectasis versus pneumonia. There is no pleural effusion or pneumothorax. There is mild stable cardiomegaly.  The osseous structures are unremarkable.  IMPRESSION: Hazy left lower lobe airspace disease which may reflect atelectasis versus pneumonia.   Electronically Signed   By: Kathreen Devoid   On: 02/04/2015 18:11   I have personally reviewed and evaluated these images and lab results as part of my medical decision-making.   EKG Interpretation   Date/Time:  Tuesday February 04 2015 17:03:55 EDT Ventricular Rate:  93 PR Interval:  125 QRS Duration: 79 QT Interval:  391 QTC Calculation: 486 R Axis:   59 Text Interpretation:  Sinus rhythm Borderline prolonged QT interval No  prior EKG for comparison Confirmed by Quinzell Malcomb MD, Lashonda Sonneborn [phone]) on 02/04/2015  5:09:10 PM      CRITICAL CARE Performed by: Forde Dandy   Total critical care time: 35 minutes  Critical care time was exclusive of separately billable procedures and treating other patients.  Critical care was necessary to treat or prevent imminent or life-threatening deterioration.  Critical care was time spent personally by me on the following activities: development of treatment plan with patient and/or surrogate as well as nursing, discussions with consultants, evaluation of patient's response to treatment, examination of patient, obtaining history from patient or surrogate, ordering and performing treatments and interventions, ordering and review of laboratory studies, ordering and review of radiographic studies, pulse oximetry and re-evaluation of patient's condition.   MDM   Final diagnoses:  C. difficile colitis  Severe sepsis ([place])    81 year old female with history of C. difficile colitis, emphysema, who presents with fever, diarrhea, and shortness of breath. She has evidence of sepsis with elevated lactate of 2.8, tachypnea, fever, AKI Cr 1.45 and leukocytosis of 26.  Presentation is concerning for recurrent C. difficile given her leukocytosis and diarrhea similar to C. difficile presentations. She is empirically treated with oral vancomycin in the setting. C diff stool study ordered. Also has a 2 L oxygen requirement here, but may be that she is not compliant with home oxygen. She has diffuse expiratory wheezing, with possible infiltrate on chest x-ray. No  clinical signs of cough or sputum production, but given her sepsis is covered for community-acquired pneumonia with ceftriaxone and azithromycin. Given a duo neb treatments for COPD exacerbation. He was febrile here to 100.4 and given Tylenol as well as a liter of fluids. Has remained normotensive, with near tachycardia here in the ED. I discussed this patient with [facility] hospitalist, who will admit for ongoing management.   Forde Dandy, MD 02/04/15 [id number]

## 2015-02-04 NOTE — ED Notes (Signed)
Elevated result info MD Oleta Mouse

## 2015-02-04 NOTE — H&P (Signed)
[facility] History and Physical  CYENNA REBELLO GYI:[id number] DOB: 1932-08-25 DOA: 02/04/2015  Referring physician: Brantley Stage, MD PCP: Jani Gravel, MD   Chief Complaint: Diarrhea  HPI: KANDIE KEIPER is a 81 y.o. female with history of HTN COPD HLD C Diff presents with fevers and diarrhea. Patients daughter stated that she had not felt well today. She had been having multiple bouts of diarrhea today. It started last night. Her daughter called her around 66 and she told her she was weak. She checked her temperature which was 100.31F and rising. She has had similar presentation in the past with C diff colitis so they were concerned for another exacerbation. She has not been drinking much either. Patient does also have some memory problems so the daughter is not sure if she really recalls. She had no belly pain but noted that her stomach was more distended than normal. No nausea or vomiting. She states that the diarrhea was stringy and foul smelling. Patient has a prior history of splenectomy.   Review of Systems:  Complete 12 point ROS performed and is unremarkable other than HPI.   Past Medical History  Diagnosis Date  . Emphysema     followed by Dr. Joya Gaskins  . S/P thoracic aortic aneurysm repair 1/10    also with bioprosthetic aortic valve prelacement [facility]). in 10/10 she has a descending thoraic aorta anuerysm and abdominal reapir [facility]). Vascular urgeon is Dr. Lunette Stands.   . Pulmonary embolism ([place]) 2010    She is no onger taking coumadin  . Hypothyroidism   . Blind right eye   . Hyperlipidemia   . S/P aortic valve replacement with bioprosthetic valve     ehco (8/11) with EF 55-60%, mild-mod MR, mild-mod TR, poorly visualized bioprosthetic aortic valve but no significant regurgitation and gradient not significanly elevated.    . CKD (chronic kidney disease)   . HTN (hypertension)   . History of Doppler echocardiogram     carotid dopplers. (8/11) no significant  stenosis  . COPD (chronic obstructive pulmonary disease) ([place])   . SVT (supraventricular tachycardia) (HCC)     HOLTER MONITOR AFTER SURGERY   . GERD (gastroesophageal reflux disease)   . Arthritis   . Cataract     RT  . Hx of echocardiogram 2015    Echo (08/2013):  EF 60-65%, no RWMA, Gr 1 DD, AVR ok (mean 5 mmHg), mild MR, PASP 22 mmHg   Past Surgical History  Procedure Laterality Date  . Thoracoabdominal aortic aneurysm repair  01/28/09    repair  . Anomalous pulmonary venous return repair    . Thoracic aortic aneurysm repair  1/10    VALVE ALSO DONE  . Back surgery  1970   . Eye surgery      AS BABY- RT EYE  POOR SIGHT  . Tonsillectomy    . Lumbar laminectomy/decompression microdiscectomy  05/31/2011    Procedure: LUMBAR LAMINECTOMY/DECOMPRESSION MICRODISCECTOMY;  Surgeon: Elaina Hoops, MD;  Location: [facility];  Service: Neurosurgery;  Laterality: Bilateral;  Bilateral Lumbar three-four decompressionj lumbar laminectomy    Social History:  reports that she quit smoking about 10 years ago. Her smoking use included Cigarettes. She has a 20 pack-year smoking history. She has never used smokeless tobacco. She reports that she does not drink alcohol or use illicit drugs.  Allergies  Allergen Reactions  . Codeine Other (See Comments)    Pt. States it has been too long to remember the reaction   .  Penicillins Other (See Comments)    Pt. Does not recall the reaction.  Has patient had a PCN reaction causing immediate rash, facial/tongue/throat swelling, SOB or lightheadedness with hypotension:  Has patient had a PCN reaction causing severe rash involving mucus membranes or skin necrosis: Has patient had a PCN reaction that required hospitalization  Has patient had a PCN reaction occurring within the last 10 years:  If all of the above answers are "NO", then may proceed with Cephalosporin use.     Family History  Problem Relation Age of Onset  . Brain cancer Maternal Grandmother    . Coronary artery disease      no premature CAD     Prior to Admission medications   Medication Sig Start Date End Date Taking? Authorizing Provider  amLODipine (NORVASC) 5 MG tablet Take 5 mg by mouth daily. 01/16/15  Yes Historical Provider, MD  aspirin EC 81 MG tablet Take 1 tablet (81 mg total) by mouth daily. 05/04/11  Yes Larey Dresser, MD  atorvastatin (LIPITOR) 20 MG tablet TAKE 1 TABLET BY MOUTH EVERY DAY 09/20/14  Yes Larey Dresser, MD  Calcium Carbonate-Vitamin D (CALCIUM-VITAMIN D) 600-200 MG-UNIT CAPS Take 1 tablet by mouth daily.   Yes Historical Provider, MD  donepezil (ARICEPT) 10 MG tablet TAKE 1 TABLET (10 MG TOTAL) BY MOUTH AT BEDTIME. 10/19/14  Yes Star Age, MD  DULERA 100-5 MCG/ACT AERO INHALE 2 PUFFS INTO THE LUNGS 2 TIMES DAILY. RINSE MOUTH AFTER EACH USE 07/16/14  Yes Elsie Stain, MD  ergocalciferol (VITAMIN D2) 50000 UNITS capsule Take 50,000 Units by mouth every 30 (thirty) days.   Yes Historical Provider, MD  ibandronate (BONIVA) 150 MG tablet Take 150 mg by mouth every 30 (thirty) days. Take in the morning with a full glass of water, on an empty stomach, and do not take anything else by mouth or lie down for the next 30 min.   Yes Historical Provider, MD  ibuprofen (ADVIL,MOTRIN) 200 MG tablet Take 400 mg by mouth daily as needed for moderate pain.   Yes Historical Provider, MD  levothyroxine (SYNTHROID, LEVOTHROID) 112 MCG tablet Take 112 mcg by mouth daily. 12/24/14  Yes Historical Provider, MD  metoprolol (LOPRESSOR) 25 MG tablet Take 1 tablet (25 mg total) by mouth 2 (two) times daily. 03/31/14  Yes Theodis Blaze, MD  Multiple Vitamin (MULITIVITAMIN WITH MINERALS) TABS Take 1 tablet by mouth daily.   Yes Historical Provider, MD  NAMENDA XR 28 MG CP24 Take 1 tablet by mouth daily. 07/17/13  Yes Historical Provider, MD  PROAIR HFA 108 (90 BASE) MCG/ACT inhaler Inhale 2 puffs into the lungs every 6 (six) hours as needed. Patient taking differently: Inhale 2 puffs  into the lungs every 6 (six) hours as needed for wheezing or shortness of breath.  05/08/14  Yes Elsie Stain, MD  saccharomyces boulardii (FLORASTOR) 250 MG capsule Take 1 capsule (250 mg total) by mouth 2 (two) times daily. 03/29/14  Yes Robbie Lis, MD  sertraline (ZOLOFT) 50 MG tablet Take 25 mg by mouth daily.  07/19/13  Yes Historical Provider, MD   Physical Exam: Filed Vitals:   02/04/15 1725 02/04/15 1730 02/04/15 1843 02/04/15 1900  BP: 105/57 106/47 99/52 104/44  Pulse: 91 86 94 90  Temp:      TempSrc:      Resp: 20 22 26 14   SpO2: 96% 96% 97% 96%    Wt Readings from Last 3 Encounters:  01/22/15 64.864  kg (143 lb)  06/28/14 59.421 kg (131 lb)  05/08/14 59.512 kg (131 lb 3.2 oz)    General:  Appears calm and comfortable Eyes: PERRL, normal lids, irises & conjunctiva ENT: grossly normal hearing, lips & tongue Neck: no LAD, masses or thyromegaly Cardiovascular: RRR, no m/r/g. No LE edema. Respiratory: CTA bilaterally, no w/r/r. Normal respiratory effort. Abdomen: soft, ntnd Skin: no rash or induration seen on limited exam Musculoskeletal: grossly normal tone BUE/BLE Psychiatric: grossly normal mood and affect Neurologic: grossly non-focal.          Labs on Admission:  Basic Metabolic Panel:  Recent Labs Lab 02/04/15 1708  NA 138  K 3.9  CL 105  CO2 22  GLUCOSE 126*  BUN 20  CREATININE 1.45*  CALCIUM 9.7   Liver Function Tests:  Recent Labs Lab 02/04/15 1708  AST 34  ALT 19  ALKPHOS 76  BILITOT 0.9  PROT 7.6  ALBUMIN 4.0    Recent Labs Lab 02/04/15 1708  LIPASE 29   No results for input(s): AMMONIA in the last 168 hours. CBC:  Recent Labs Lab 02/04/15 1708  WBC 26.4*  NEUTROABS 21.4*  HGB 13.0  HCT 40.6  MCV 89.2  PLT 128*   Cardiac Enzymes: No results for input(s): CKTOTAL, CKMB, CKMBINDEX, TROPONINI in the last 168 hours.  BNP (last 3 results) No results for input(s): BNP in the last 8760 hours.  ProBNP (last 3  results) No results for input(s): PROBNP in the last 8760 hours.  CBG: No results for input(s): GLUCAP in the last 168 hours.  Radiological Exams on Admission: Dg Chest 2 View  02/04/2015   CLINICAL DATA:  Fever, COPD  EXAM: CHEST  2 VIEW  COMPARISON:  06/28/2014  FINDINGS: There is mild bilateral interstitial thickening. Hazy left lower lobe airspace disease which may reflect atelectasis versus pneumonia. There is no pleural effusion or pneumothorax. There is mild stable cardiomegaly.  The osseous structures are unremarkable.  IMPRESSION: Hazy left lower lobe airspace disease which may reflect atelectasis versus pneumonia.   Electronically Signed   By: Kathreen Devoid   On: 02/04/2015 18:11      Assessment/Plan Principal Problem:   Sepsis ([place]) Active Problems:   Hyperlipidemia   COPD with chronic bronchitis (HCC)   GERD   History of Clostridium difficile colitis   Acute kidney injury superimposed on chronic kidney disease ([place])   1. Probable sepsis related to C Diff colitis -started on sepsis protocol -will get blood cultures -follow up lactate -will start on empiric levaquin and flagyl for now -pressures noted to be soft will fluid resucitate -will also check stool C diff  2. AKI on CKD -will hydrate -repeat labs in am  3. COPD -continue with inhalers  4. GERD 5. Hyperlipidemia -on statins  6. Hypothyroid -on synthroid -check TSH  7. HTN -will hold her antihypertensives for now as her pressures are soft (lopressor, Norvasc -will reassess and restart pressures once she is more stable     Code Status: full code (must indicate code status--if unknown or must be presumed, indicate so) DVT Prophylaxis:heparin Family Communication: none (indicate person spoken with, if applicable, with phone number if by telephone) Disposition Plan: home (indicate anticipated LOS)    [facility] Pager [phone]

## 2015-02-04 NOTE — ED Notes (Signed)
Patient coming from home. Complaint of fever, diarrhea with some blood today. Temp 101 F per EMS. Complaint of shortness of breath. Expiratory wheezing in lower lobes per EMS. 5 Albuterol with prior to arrival. 1 g Tylenol prior to arrival

## 2015-02-05 DIAGNOSIS — N183 Chronic kidney disease, stage 3 unspecified: Secondary | ICD-10-CM | POA: Diagnosis present

## 2015-02-05 DIAGNOSIS — D631 Anemia in chronic kidney disease: Secondary | ICD-10-CM

## 2015-02-05 DIAGNOSIS — N182 Chronic kidney disease, stage 2 (mild): Secondary | ICD-10-CM

## 2015-02-05 DIAGNOSIS — D696 Thrombocytopenia, unspecified: Secondary | ICD-10-CM | POA: Diagnosis present

## 2015-02-05 DIAGNOSIS — N179 Acute kidney failure, unspecified: Secondary | ICD-10-CM | POA: Diagnosis present

## 2015-02-05 DIAGNOSIS — A0472 Enterocolitis due to Clostridium difficile, not specified as recurrent: Secondary | ICD-10-CM | POA: Diagnosis present

## 2015-02-05 DIAGNOSIS — J189 Pneumonia, unspecified organism: Secondary | ICD-10-CM | POA: Diagnosis present

## 2015-02-05 DIAGNOSIS — J181 Lobar pneumonia, unspecified organism: Secondary | ICD-10-CM

## 2015-02-05 LAB — OCCULT BLOOD X 1 CARD TO LAB, STOOL: Fecal Occult Bld: NEGATIVE

## 2015-02-05 LAB — COMPREHENSIVE METABOLIC PANEL
ALBUMIN: 2.8 g/dL — AB (ref 3.5–5.0)
ALT: 14 U/L (ref 14–54)
ANION GAP: 4 — AB (ref 5–15)
AST: 22 U/L (ref 15–41)
Alkaline Phosphatase: 53 U/L (ref 38–126)
BUN: 16 mg/dL (ref 6–20)
CHLORIDE: 116 mmol/L — AB (ref 101–111)
CO2: 20 mmol/L — AB (ref 22–32)
Calcium: 7.4 mg/dL — ABNORMAL LOW (ref 8.9–10.3)
Creatinine, Ser: 1.28 mg/dL — ABNORMAL HIGH (ref 0.44–1.00)
GFR calc non Af Amer: 38 mL/min — ABNORMAL LOW (ref >60)
GFR, EST AFRICAN AMERICAN: 45 mL/min — AB (ref >60)
GLUCOSE: 103 mg/dL — AB (ref 65–99)
POTASSIUM: 3.6 mmol/L (ref 3.5–5.1)
SODIUM: 140 mmol/L (ref 135–145)
Total Bilirubin: 0.6 mg/dL (ref 0.3–1.2)
Total Protein: 5.5 g/dL — ABNORMAL LOW (ref 6.5–8.1)

## 2015-02-05 LAB — CBC
HEMATOCRIT: 31.3 % — AB (ref 36.0–46.0)
HEMOGLOBIN: 10 g/dL — AB (ref 12.0–15.0)
MCH: 28.9 pg (ref 26.0–34.0)
MCHC: 31.9 g/dL (ref 30.0–36.0)
MCV: 90.5 fL (ref 78.0–100.0)
Platelets: 218 10*3/uL (ref 150–400)
RBC: 3.46 MIL/uL — AB (ref 3.87–5.11)
RDW: 16.9 % — ABNORMAL HIGH (ref 11.5–15.5)
WBC: 20.5 10*3/uL — ABNORMAL HIGH (ref 4.0–10.5)

## 2015-02-05 LAB — LACTIC ACID, PLASMA
LACTIC ACID, VENOUS: 1.5 mmol/L (ref 0.5–2.0)
Lactic Acid, Venous: 3.5 mmol/L (ref 0.5–2.0)

## 2015-02-05 LAB — GLUCOSE, CAPILLARY: GLUCOSE-CAPILLARY: 90 mg/dL (ref 65–99)

## 2015-02-05 MED ORDER — MAGNESIUM SULFATE 2 GM/50ML IV SOLN
2.0000 g | Freq: Once | INTRAVENOUS | Status: AC
  Administered 2015-02-05: 2 g via INTRAVENOUS
  Filled 2015-02-05: qty 50

## 2015-02-05 MED ORDER — DOXYCYCLINE HYCLATE 100 MG PO TABS
100.0000 mg | ORAL_TABLET | Freq: Two times a day (BID) | ORAL | Status: DC
  Administered 2015-02-05 – 2015-02-11 (×12): 100 mg via ORAL
  Filled 2015-02-05 (×13): qty 1

## 2015-02-05 NOTE — Progress Notes (Signed)
Patient ID: Sarah Johnson, female   DOB: 04-06-1933, 81 y.o.   MRN: [id number]  [facility] PROGRESS NOTE  Sarah Johnson VVO:[id number] DOB: December 03, 1932 DOA: 02/04/2015 PCP: Jani Gravel, Sarah Johnson   Brief narrative:    81 y.o. female with history of HTN, COPD, HLD, C Diff, presented with fevers up to 100.2 F and diarrhea.  Assessment/Plan:    Principal Problem:   Sepsis ([place]) - criteria for sepsis met on admission and with source possibly related to C. Diff and ? LLL PNA - pt has been given multiple ABX since arrival to the ED including Flagyl, Rocephin, Levaquin, oral vanc - would leave on oral vancomycin for now and transition to oral doxycycline  - WBC is trending down - repeat CBC in AM  Active Problems:   Chronic renal failure stage II - III - with baseline Cr 1.4 - 1.5 (7 months ago) - Cr is currently at baseline - BMP in AM    Thrombocytopenia - no signs of bleeding - CBC in AM    Hypokalemia - supplement and repeat BMP in AM    Hypomagnesemia - supplement and repeat Mg in AM    Anemia of CKD stage II - III - drop in Hg since admission likely dilutional  - no signs of active bleeding - CBC in AM  DVT prophylaxis - Heparin SQ  Code Status: Full.  Family Communication:  plan of care discussed with the patient Disposition Plan: Home when stable.   IV access:  Peripheral IV  Procedures and diagnostic studies:    Dg Chest 2 View 02/04/2015  Hazy left lower lobe airspace disease which may reflect atelectasis versus pneumonia.  Medical Consultants:  None  Other Consultants:  None  IAnti-Infectives:   Oral vancomycin 10/11 --> Doxycyline 10/12 -->   Sarah Ramsay, Sarah Johnson  [facility] Pager [phone]  If 7PM-7AM, please contact night-coverage [email or web address] Password [email or web address] 02/05/2015, 4:23 PM   LOS: 1 day   HPI/Subjective: No events overnight.   Objective: Filed Vitals:   02/05/15 0403 02/05/15 0500 02/05/15 0834 02/05/15 1445  BP: 124/50    128/59  Pulse: 72   75  Temp: 98.2 F (36.8 C)   98.2 F (36.8 C)  TempSrc: Oral   Oral  Resp: 20   18  Height:      Weight:  67.813 kg (149 lb 8 oz)    SpO2: 95%  96% 98%    Intake/Output Summary (Last 24 hours) at 02/05/15 1623 Last data filed at 02/05/15 1500  Gross per 24 hour  Intake   1080 ml  Output      0 ml  Net   1080 ml    Exam:   General:  Pt is alert, follows commands appropriately, not in acute distress  Cardiovascular: Regular rate and rhythm, no rubs, no gallops  Respiratory: Clear to auscultation bilaterally, no wheezing, no crackles, no rhonchi  Abdomen: Soft, non tender, non distended, bowel sounds present, no guarding   Data Reviewed: Basic Metabolic Panel:  Recent Labs Lab 02/04/15 1708 02/04/15 2155 02/05/15 0245  NA 138 137 140  K 3.9 3.0* 3.6  CL 105 107 116*  CO2 22 20* 20*  GLUCOSE 126* 139* 103*  BUN $Re'20 20 16  'mcj$ CREATININE 1.45* 1.49* 1.28*  CALCIUM 9.7 8.1* 7.4*  MG  --  1.3*  --    Liver Function Tests:  Recent Labs Lab 02/04/15 1708 02/04/15 2155 02/05/15 0245  AST 34 31 22  ALT 19  15 14  ALKPHOS 76 58 53  BILITOT 0.9 0.2* 0.6  PROT 7.6 6.3* 5.5*  ALBUMIN 4.0 3.1* 2.8*    Recent Labs Lab 02/04/15 1708  LIPASE 29   CBC:  Recent Labs Lab 02/04/15 1708 02/04/15 2155 02/05/15 0245  WBC 26.4* 22.0* 20.5*  NEUTROABS 21.4* 18.2*  --   HGB 13.0 10.7* 10.0*  HCT 40.6 33.5* 31.3*  MCV 89.2 89.8 90.5  PLT 128* 165 218   CBG:  Recent Labs Lab 02/05/15 0805  GLUCAP 90   Recent Results (from the past 240 hour(s))  Blood culture (routine x 2)     Status: None (Preliminary result)   Collection Time: 02/04/15  5:23 PM  Result Value Ref Range Status   Specimen Description BLOOD LEFT ANTECUBITAL  Final   Special Requests BOTTLES DRAWN AEROBIC AND ANAEROBIC 5CC  Final   Culture   Final    NO GROWTH < 24 HOURS Performed at [facility]    Report Status PENDING  Incomplete  C difficile quick scan w PCR  reflex     Status: Abnormal   Collection Time: 02/04/15  6:43 PM  Result Value Ref Range Status   C Diff antigen POSITIVE (A) NEGATIVE Final   C Diff toxin NEGATIVE NEGATIVE Final   C Diff interpretation   Final    C. difficile present, but toxin not detected. This indicates colonization. In most cases, this does not require treatment. If patient has signs and symptoms consistent with colitis, consider treatment. Requires ENTERIC precautions.  Blood culture (routine x 2)     Status: None (Preliminary result)   Collection Time: 02/04/15  9:55 PM  Result Value Ref Range Status   Specimen Description BLOOD RIGHT ARM  Final   Special Requests BOTTLES DRAWN AEROBIC AND ANAEROBIC 10 CC  Final   Culture   Final    NO GROWTH < 24 HOURS Performed at [facility]    Report Status PENDING  Incomplete     Scheduled Meds: . aspirin EC  81 mg Oral Daily  . atorvastatin  20 mg Oral Daily  . calcium-vitamin D  1 tablet Oral Q breakfast  . donepezil  10 mg Oral QHS  . folic acid  1 mg Oral Daily  . heparin  5,000 Units Subcutaneous 3 times per day  . levofloxacin  IV  750 mg Intravenous Q48H  . levothyroxine  112 mcg Oral QAC breakfast  . memantine  28 mg Oral Daily  . metronidazole  500 mg Intravenous 3 times per day  . mometasone-formoterol  2 puff Inhalation BID  . sertraline  25 mg Oral Daily  . thiamine  100 mg Oral Daily  . vancomycin  125 mg Oral 4 times per day   Continuous Infusions: . sodium chloride 50 mL/hr at 02/04/15 2049

## 2015-02-06 LAB — C DIFFICILE QUICK SCREEN W PCR REFLEX
C DIFFICILE (CDIFF) INTERP: NEGATIVE
C DIFFICILE (CDIFF) TOXIN: NEGATIVE
C DIFFICLE (CDIFF) ANTIGEN: NEGATIVE

## 2015-02-06 LAB — CBC
HCT: 34.8 % — ABNORMAL LOW (ref 36.0–46.0)
Hemoglobin: 11 g/dL — ABNORMAL LOW (ref 12.0–15.0)
MCH: 28.4 pg (ref 26.0–34.0)
MCHC: 31.6 g/dL (ref 30.0–36.0)
MCV: 89.9 fL (ref 78.0–100.0)
PLATELETS: 218 10*3/uL (ref 150–400)
RBC: 3.87 MIL/uL (ref 3.87–5.11)
RDW: 17.1 % — AB (ref 11.5–15.5)
WBC: 12.8 10*3/uL — AB (ref 4.0–10.5)

## 2015-02-06 LAB — BASIC METABOLIC PANEL
ANION GAP: 7 (ref 5–15)
BUN: 7 mg/dL (ref 6–20)
CALCIUM: 8.1 mg/dL — AB (ref 8.9–10.3)
CO2: 23 mmol/L (ref 22–32)
Chloride: 110 mmol/L (ref 101–111)
Creatinine, Ser: 0.95 mg/dL (ref 0.44–1.00)
GFR, EST NON AFRICAN AMERICAN: 55 mL/min — AB (ref >60)
GLUCOSE: 91 mg/dL (ref 65–99)
Potassium: 3.5 mmol/L (ref 3.5–5.1)
Sodium: 140 mmol/L (ref 135–145)

## 2015-02-06 LAB — HEMOGLOBIN A1C
Hgb A1c MFr Bld: 6.1 % — ABNORMAL HIGH (ref 4.8–5.6)
MEAN PLASMA GLUCOSE: 128 mg/dL

## 2015-02-06 LAB — MAGNESIUM: Magnesium: 1.6 mg/dL — ABNORMAL LOW (ref 1.7–2.4)

## 2015-02-06 LAB — GLUCOSE, CAPILLARY: GLUCOSE-CAPILLARY: 85 mg/dL (ref 65–99)

## 2015-02-06 MED ORDER — MAGNESIUM SULFATE 2 GM/50ML IV SOLN
2.0000 g | Freq: Once | INTRAVENOUS | Status: AC
  Administered 2015-02-06: 2 g via INTRAVENOUS
  Filled 2015-02-06: qty 50

## 2015-02-06 NOTE — Evaluation (Signed)
Physical Therapy Evaluation Patient Details Name: Sarah Johnson MRN: [id number] DOB: 09-29-1932 Today's Date: 02/06/2015   History of Present Illness  81 y.o. female with history of HTN, COPD, HLD, C Diff, presented with fevers up to 100.2 F and diarrhea, diagnosed with LLL pneumonia.  Clinical Impression  Pt admitted with above diagnosis. Pt currently with functional limitations due to the deficits listed below (see PT Problem List).  Pt will benefit from skilled PT to increase their independence and safety with mobility to allow discharge to the venue listed below.  Pt agreeable to mobilize however R plantar foot pain limiting WBing and pt only agreeable to transfer to recliner.       Follow Up Recommendations Supervision/Assistance - 24 hour;Home health PT    Equipment Recommendations  Rolling walker with 5" wheels    Recommendations for Other Services       Precautions / Restrictions Precautions Precautions: Fall      Mobility  Bed Mobility Overal bed mobility: Needs Assistance Bed Mobility: Supine to Sit     Supine to sit: Supervision;HOB elevated        Transfers Overall transfer level: Needs assistance Equipment used: None Transfers: Sit to/from [employer] Transfers Sit to Stand: Min guard Stand pivot transfers: Min guard       General transfer comment: min/guard for safety, pt using UEs on bed rail and armrests to self assist, transferred prior to asking if RW would assist, also reports quickiness due to R foot pain with WBing, better in reclined position, did not wish to ambulate at this time  Ambulation/Gait                Stairs            Wheelchair Mobility    Modified Rankin (Stroke Patients Only)       Balance Overall balance assessment: Needs assistance         Standing balance support: Single extremity supported;During functional activity Standing balance-Leahy Scale: Poor Standing balance comment: requiring UE  assist for transfers, possibly due to pain                             Pertinent Vitals/Pain Pain Assessment: 0-10 Pain Score: 8  Pain Location: plantar foot Pain Descriptors / Indicators: Sore;Tender Pain Intervention(s): Limited activity within patient's tolerance;Monitored during session    Home Living Family/patient expects to be discharged to:: Private residence Living Arrangements: Alone Available Help at Discharge: Family;Friend(s) Type of Home: House Home Access: Level entry     Home Layout: One level Home Equipment: None      Prior Function Level of Independence: Independent         Comments: drives     Hand Dominance        Extremity/Trunk Assessment               Lower Extremity Assessment: Generalized weakness (R plantar surface of foot tender to touch and stretch, presents like plantar fasciitis)         Communication   Communication: No difficulties;Other (comment) (R eye blindness)  Cognition Arousal/Alertness: Awake/alert Behavior During Therapy: WFL for tasks assessed/performed Overall Cognitive Status: Within Functional Limits for tasks assessed                      General Comments      Exercises        Assessment/Plan    PT Assessment  Patient needs continued PT services  PT Diagnosis Difficulty walking;Acute pain   PT Problem List Decreased strength;Decreased mobility;Decreased knowledge of use of DME;Pain;Decreased balance;Decreased safety awareness  PT Treatment Interventions DME instruction;Gait training;Functional mobility training;Patient/family education;Therapeutic activities;Therapeutic exercise   PT Goals (Current goals can be found in the Care Plan section) Acute Rehab PT Goals PT Goal Formulation: With patient Time For Goal Achievement: 02/13/15 Potential to Achieve Goals: Good    Frequency Min 3X/week   Barriers to discharge        Co-evaluation               End of Session    Activity Tolerance: Patient limited by pain Patient left: in chair;with call bell/phone within reach Nurse Communication: Mobility status (aware O2 [place] left off, SPO2 96% room air)         Time: 5956-3875 PT Time Calculation (min) (ACUTE ONLY): 18 min   Charges:   PT Evaluation $Initial PT Evaluation Tier I: 1 Procedure     PT G Codes:        Sarah Johnson,Sarah Johnson 02/06/2015, 1:55 PM Sarah Johnson, PT, DPT 02/06/2015 Pager: [phone]

## 2015-02-06 NOTE — Progress Notes (Signed)
Patient ID: Sarah Johnson, female   DOB: 08/08/1932, 81 y.o.   MRN: [id number]  [facility] PROGRESS NOTE  Sarah Johnson WHQ:[id number] DOB: [birth year] DOA: 02/04/2015 PCP: Jani Gravel, MD   Brief narrative:    81 y.o. female with history of HTN, COPD, HLD, C Diff, presented with fevers up to 100.2 F and diarrhea.  Assessment/Plan:    Principal Problem:   Sepsis ([place]) - criteria for sepsis met on admission and with source possibly related to C. Diff and ? LLL PNA - pt has been given multiple ABX since arrival to the ED including Flagyl, Rocephin, Levaquin, oral vanc - continue oral vancomycin for now and oral doxycycline  - WBC is trending down - repeat CBC in AM  Active Problems:   LLL PNA - continue doxycycline day #2    Diarrhea - possibly related to C. Diff, stool study negative for toxin but positive for C. Diff ag - given high suspicion for C. Diff, WBC > 20K, diarrhea, we are treating this as C. Diff  - continue oral vancomycin as noted above day #3    Chronic renal failure stage II - III - with baseline Cr 1.4 - 1.5 (7 months ago) - Cr is currently at baseline and WNL - BMP in AM    Thrombocytopenia - no signs of bleeding, resolved  - CBC in AM    Hypokalemia - supplemented and WNL this AM    Hypomagnesemia - still low, continue to supplement and repeat in AM    Anemia of CKD stage II - III - drop in Hg since admission likely dilutional  - no signs of active bleeding, HG stable in the past 24 hours  - CBC in AM  DVT prophylaxis - Heparin SQ  Code Status: Full.  Family Communication:  plan of care discussed with the patient and daughter at bedside  Disposition Plan: Home when stable.   IV access:  Peripheral IV  Procedures and diagnostic studies:    Dg Chest 2 View 02/04/2015  Hazy left lower lobe airspace disease which may reflect atelectasis versus pneumonia.  Medical Consultants:  None  Other Consultants:   None  IAnti-Infectives:   Oral vancomycin 10/11 --> Doxycyline 10/12 -->   Faye Ramsay, MD  [facility] Pager [phone]  If 7PM-7AM, please contact night-coverage [email or web address] Password [email or web address] 02/06/2015, 6:59 AM   LOS: 2 days   HPI/Subjective: No events overnight.   Objective: Filed Vitals:   02/05/15 1445 02/05/15 1954 02/05/15 2153 02/06/15 0502  BP: 128/59  133/50 143/56  Pulse: 75  69 71  Temp: 98.2 F (36.8 C)  98.1 F (36.7 C) 98 F (36.7 C)  TempSrc: Oral  Oral Oral  Resp: _0 Height:      Weight:    66.225 kg (146 lb)  SpO2: 98% 98% 96% 97%    Intake/Output Summary (Last 24 hours) at 02/06/15 0659 Last data filed at 02/05/15 1500  Gross per 24 hour  Intake    980 ml  Output      0 ml  Net    980 ml    Exam:   General:  Pt is alert, follows commands appropriately, not in acute distress  Cardiovascular: Regular rate and rhythm, no rubs, no gallops  Respiratory: Clear to auscultation bilaterally, no wheezing, no crackles, no rhonchi  Abdomen: Soft, non tender, non distended, bowel sounds present, no guarding   Data Reviewed: Basic Metabolic Panel:  Recent Labs Lab 02/04/15  1708 02/04/15 2155 02/05/15 0245 02/06/15 0522  NA 138 137 140 140  K 3.9 3.0* 3.6 3.5  CL 105 107 116* 110  CO2 22 20* 20* 23  GLUCOSE 126* 139* 103* 91  BUN _0 CREATININE 1.45* 1.49* 1.28* 0.95  CALCIUM 9.7 8.1* 7.4* 8.1*  MG  --  1.3*  --  1.6*   Liver Function Tests:  Recent Labs Lab 02/04/15 1708 02/04/15 2155 02/05/15 0245  AST 34 31 22  ALT _1 ALKPHOS 76 58 53  BILITOT 0.9 0.2* 0.6  PROT 7.6 6.3* 5.5*  ALBUMIN 4.0 3.1* 2.8*    Recent Labs Lab 02/04/15 1708  LIPASE 29   CBC:  Recent Labs Lab 02/04/15 1708 02/04/15 2155 02/05/15 0245 02/06/15 0522  WBC 26.4* 22.0* 20.5* 12.8*  NEUTROABS 21.4* 18.2*  --   --   HGB 13.0 10.7* 10.0* 11.0*  HCT 40.6 33.5* 31.3* 34.8*  MCV 89.2 89.8 90.5 89.9  PLT 128* 165 218 218    CBG:  Recent Labs Lab 02/05/15 0805  GLUCAP 90   Recent Results (from the past 240 hour(s))  Blood culture (routine x 2)     Status: None (Preliminary result)   Collection Time: 02/04/15  5:23 PM  Result Value Ref Range Status   Specimen Description BLOOD LEFT ANTECUBITAL  Final   Special Requests BOTTLES DRAWN AEROBIC AND ANAEROBIC 5CC  Final   Culture   Final    NO GROWTH < 24 HOURS Performed at [facility]    Report Status PENDING  Incomplete  C difficile quick scan w PCR reflex     Status: Abnormal   Collection Time: 02/04/15  6:43 PM  Result Value Ref Range Status   C Diff antigen POSITIVE (A) NEGATIVE Final   C Diff toxin NEGATIVE NEGATIVE Final   C Diff interpretation   Final    C. difficile present, but toxin not detected. This indicates colonization. In most cases, this does not require treatment. If patient has signs and symptoms consistent with colitis, consider treatment. Requires ENTERIC precautions.  Blood culture (routine x 2)     Status: None (Preliminary result)   Collection Time: 02/04/15  9:55 PM  Result Value Ref Range Status   Specimen Description BLOOD RIGHT ARM  Final   Special Requests BOTTLES DRAWN AEROBIC AND ANAEROBIC 10 CC  Final   Culture   Final    NO GROWTH < 24 HOURS Performed at [facility]    Report Status PENDING  Incomplete     Scheduled Meds: . aspirin EC  81 mg Oral Daily  . atorvastatin  20 mg Oral Daily  . calcium-vitamin D  1 tablet Oral Q breakfast  . donepezil  10 mg Oral QHS  . folic acid  1 mg Oral Daily  . heparin  5,000 Units Subcutaneous 3 times per day  . levofloxacin  IV  750 mg Intravenous Q48H  . levothyroxine  112 mcg Oral QAC breakfast  . memantine  28 mg Oral Daily  . metronidazole  500 mg Intravenous 3 times per day  . mometasone-formoterol  2 puff Inhalation BID  . sertraline  25 mg Oral Daily  . thiamine  100 mg Oral Daily  . vancomycin  125 mg Oral 4 times per day   Continuous  Infusions: . sodium chloride 50 mL/hr at 02/06/15 0035

## 2015-02-07 ENCOUNTER — Inpatient Hospital Stay (HOSPITAL_COMMUNITY): Payer: Medicare Other

## 2015-02-07 DIAGNOSIS — A419 Sepsis, unspecified organism: Secondary | ICD-10-CM

## 2015-02-07 DIAGNOSIS — R6520 Severe sepsis without septic shock: Secondary | ICD-10-CM

## 2015-02-07 DIAGNOSIS — R52 Pain, unspecified: Secondary | ICD-10-CM

## 2015-02-07 LAB — MAGNESIUM: MAGNESIUM: 1.8 mg/dL (ref 1.7–2.4)

## 2015-02-07 LAB — BASIC METABOLIC PANEL
Anion gap: 5 (ref 5–15)
BUN: 6 mg/dL (ref 6–20)
CALCIUM: 8.2 mg/dL — AB (ref 8.9–10.3)
CO2: 24 mmol/L (ref 22–32)
CREATININE: 1 mg/dL (ref 0.44–1.00)
Chloride: 111 mmol/L (ref 101–111)
GFR, EST NON AFRICAN AMERICAN: 52 mL/min — AB (ref >60)
Glucose, Bld: 95 mg/dL (ref 65–99)
Potassium: 3.8 mmol/L (ref 3.5–5.1)
SODIUM: 140 mmol/L (ref 135–145)

## 2015-02-07 LAB — CBC
HEMATOCRIT: 33.5 % — AB (ref 36.0–46.0)
HEMOGLOBIN: 10.8 g/dL — AB (ref 12.0–15.0)
MCH: 29 pg (ref 26.0–34.0)
MCHC: 32.2 g/dL (ref 30.0–36.0)
MCV: 89.8 fL (ref 78.0–100.0)
Platelets: 227 10*3/uL (ref 150–400)
RBC: 3.73 MIL/uL — AB (ref 3.87–5.11)
RDW: 17.2 % — ABNORMAL HIGH (ref 11.5–15.5)
WBC: 14 10*3/uL — AB (ref 4.0–10.5)

## 2015-02-07 LAB — GLUCOSE, CAPILLARY: GLUCOSE-CAPILLARY: 78 mg/dL (ref 65–99)

## 2015-02-07 MED ORDER — ENOXAPARIN SODIUM 80 MG/0.8ML ~~LOC~~ SOLN
1.0000 mg/kg | Freq: Two times a day (BID) | SUBCUTANEOUS | Status: DC
  Administered 2015-02-07 – 2015-02-09 (×4): 65 mg via SUBCUTANEOUS
  Filled 2015-02-07 (×4): qty 0.8

## 2015-02-07 NOTE — Progress Notes (Signed)
PT Cancellation Note  Patient Details Name: Sarah Johnson MRN: [id number] DOB: 22-Apr-1933   Cancelled Treatment:    Reason Eval/Treat Not Completed: Medical issues which prohibited therapy Right DVT noted in a soleal vein in the mid calf.  Will await anticoagulants.   Sarah Johnson,Sarah Johnson 02/07/2015, 3:22 PM Carmelia Bake, PT, DPT 02/07/2015 Pager: [phone]

## 2015-02-07 NOTE — Progress Notes (Signed)
Patient ID: Sarah Johnson, female   DOB: 07-Nov-1932, 81 y.o.   MRN: [id number]  [facility] PROGRESS NOTE  Sarah Johnson:[id number] DOB: Nov 08, 1932 DOA: 02/04/2015 PCP: Jani Gravel, MD   Brief narrative:    81 y.o. female with history of HTN, COPD, HLD, C Diff, presented with fevers up to 100.2 F and diarrhea.  Assessment/Plan:    Principal Problem:   Sepsis ([place]) - criteria for sepsis met on admission and with source possibly related to C. Diff and ? LLL PNA - pt has been given multiple ABX since arrival to the ED including Flagyl, Rocephin, Levaquin, oral vanc - continue oral vancomycin for now and oral doxycycline  - WBC is up this AM - repeat CBC in AM  Active Problems:   LLL PNA - continue doxycycline day #3    Diarrhea - possibly related to C. Diff, stool study negative for toxin but positive for C. Diff ag - given high suspicion for C. Diff, WBC > 20K, diarrhea, we are treating this as C. Diff  - continue oral vancomycin as noted above day #4/7    RLE pain  - doppler positive for DVT  - started Lovenox per pharmacy  - d/w daughter at bedside     Chronic renal failure stage II - III - with baseline Cr 1.4 - 1.5 (7 months ago) - Cr is currently at baseline and WNL - BMP in AM    Thrombocytopenia - no signs of bleeding, resolved  - CBC in AM    Hypokalemia - supplemented and WNL this AM    Hypomagnesemia - supplemented  - repeat in AM    Anemia of CKD stage II - III - drop in Hg since admission likely dilutional  - no signs of active bleeding, HG stable in the past 24 hours  - CBC in AM  DVT prophylaxis - Heparin SQ  Code Status: Full.  Family Communication:  plan of care discussed with the patient and daughter at bedside  Disposition Plan: Home when stable.   IV access:  Peripheral IV  Procedures and diagnostic studies:    Dg Chest 2 View 02/04/2015  Hazy left lower lobe airspace disease which may reflect atelectasis versus  pneumonia.  Medical Consultants:  None  Other Consultants:  None  IAnti-Infectives:   Oral vancomycin 10/11 --> Doxycyline 10/12 -->   Faye Ramsay, MD  [facility] Pager [phone]  If 7PM-7AM, please contact night-coverage [email or web address] Password [email or web address] 02/07/2015, 12:49 PM   LOS: 3 days   HPI/Subjective: No events overnight.   Objective: Filed Vitals:   02/06/15 2055 02/06/15 2200 02/07/15 0613 02/07/15 0844  BP:  141/64 131/77   Pulse:  73 69 69  Temp:  97.6 F (36.4 C) 97.5 F (36.4 C)   TempSrc:  Oral Oral   Resp:  $Remo'17 18 18  'llvNa$ Height:      Weight:   65.4 kg (144 lb 2.9 oz)   SpO2: 96% 100% 99% 98%    Intake/Output Summary (Last 24 hours) at 02/07/15 1249 Last data filed at 02/07/15 0930  Gross per 24 hour  Intake    360 ml  Output      0 ml  Net    360 ml    Exam:   General:  Pt is alert, follows commands appropriately, not in acute distress  Cardiovascular: Regular rate and rhythm, no rubs, no gallops  Respiratory: Clear to auscultation bilaterally, no wheezing, no crackles, no rhonchi  Abdomen: Soft,  non tender, non distended, bowel sounds present, no guarding   Data Reviewed: Basic Metabolic Panel:  Recent Labs Lab 02/04/15 1708 02/04/15 2155 02/05/15 0245 02/06/15 0522 02/07/15 0527  NA 138 137 140 140 140  K 3.9 3.0* 3.6 3.5 3.8  CL 105 107 116* 110 111  CO2 22 20* 20* 23 24  GLUCOSE 126* 139* 103* 91 95  BUN $Re'20 20 16 7 6  'mFJ$ CREATININE 1.45* 1.49* 1.28* 0.95 1.00  CALCIUM 9.7 8.1* 7.4* 8.1* 8.2*  MG  --  1.3*  --  1.6* 1.8   Liver Function Tests:  Recent Labs Lab 02/04/15 1708 02/04/15 2155 02/05/15 0245  AST 34 31 22  ALT $Re'19 15 14  'WEk$ ALKPHOS 76 58 53  BILITOT 0.9 0.2* 0.6  PROT 7.6 6.3* 5.5*  ALBUMIN 4.0 3.1* 2.8*    Recent Labs Lab 02/04/15 1708  LIPASE 29   CBC:  Recent Labs Lab 02/04/15 1708 02/04/15 2155 02/05/15 0245 02/06/15 0522 02/07/15 0527  WBC 26.4* 22.0* 20.5* 12.8* 14.0*  NEUTROABS 21.4* 18.2*   --   --   --   HGB 13.0 10.7* 10.0* 11.0* 10.8*  HCT 40.6 33.5* 31.3* 34.8* 33.5*  MCV 89.2 89.8 90.5 89.9 89.8  PLT 128* 165 218 218 227   CBG:  Recent Labs Lab 02/05/15 0805 02/06/15 0829 02/07/15 0717  GLUCAP 90 85 78   Recent Results (from the past 240 hour(s))  Blood culture (routine x 2)     Status: None (Preliminary result)   Collection Time: 02/04/15  5:23 PM  Result Value Ref Range Status   Specimen Description BLOOD LEFT ANTECUBITAL  Final   Special Requests BOTTLES DRAWN AEROBIC AND ANAEROBIC 5CC  Final   Culture   Final    NO GROWTH < 24 HOURS Performed at [facility]    Report Status PENDING  Incomplete  C difficile quick scan w PCR reflex     Status: Abnormal   Collection Time: 02/04/15  6:43 PM  Result Value Ref Range Status   C Diff antigen POSITIVE (A) NEGATIVE Final   C Diff toxin NEGATIVE NEGATIVE Final   C Diff interpretation   Final    C. difficile present, but toxin not detected. This indicates colonization. In most cases, this does not require treatment. If patient has signs and symptoms consistent with colitis, consider treatment. Requires ENTERIC precautions.  Blood culture (routine x 2)     Status: None (Preliminary result)   Collection Time: 02/04/15  9:55 PM  Result Value Ref Range Status   Specimen Description BLOOD RIGHT ARM  Final   Special Requests BOTTLES DRAWN AEROBIC AND ANAEROBIC 10 CC  Final   Culture   Final    NO GROWTH < 24 HOURS Performed at [facility]    Report Status PENDING  Incomplete    . aspirin EC  81 mg Oral Daily  . atorvastatin  20 mg Oral Daily  . calcium-vitamin D  1 tablet Oral Q breakfast  . donepezil  10 mg Oral QHS  . doxycycline  100 mg Oral Q12H  . folic acid  1 mg Oral Daily  . heparin  5,000 Units Subcutaneous 3 times per day  . levothyroxine  112 mcg Oral QAC breakfast  . memantine  28 mg Oral Daily  . mometasone-formoterol  2 puff Inhalation BID  . multivitamin with minerals  1 tablet  Oral Daily  . saccharomyces boulardii  250 mg Oral BID  . sertraline  25 mg Oral Daily  . sodium chloride  3 mL Intravenous Q12H  . thiamine  100 mg Oral Daily  . vancomycin  125 mg Oral 4 times per day  . [START ON 03/03/2015] Vitamin D (Ergocalciferol)  50,000 Units Oral Q30 days

## 2015-02-07 NOTE — Progress Notes (Signed)
ANTICOAGULATION CONSULT NOTE - Initial Consult  Pharmacy Consult for enoxaparin Indication: VTE treatment  Allergies  Allergen Reactions  . Codeine Other (See Comments)    Pt. States it has been too long to remember the reaction   . Penicillins Other (See Comments)    Pt. Does not recall the reaction.  Has patient had a PCN reaction causing immediate rash, facial/tongue/throat swelling, SOB or lightheadedness with hypotension:  Has patient had a PCN reaction causing severe rash involving mucus membranes or skin necrosis: Has patient had a PCN reaction that required hospitalization  Has patient had a PCN reaction occurring within the last 10 years:  If all of the above answers are "NO", then may proceed with Cephalosporin use.     Patient Measurements: Height: 5\' 2"  (157.5 cm) Weight: 144 lb 2.9 oz (65.4 kg) IBW/kg (Calculated) : 50.1   Vital Signs: Temp: 99.2 F (37.3 C) (10/14 1447) Temp Source: Oral (10/14 1447) BP: 130/62 mmHg (10/14 1447) Pulse Rate: 79 (10/14 1447)  Labs:  Recent Labs  02/04/15 2155 02/05/15 0245 02/06/15 0522 02/07/15 0527  HGB 10.7* 10.0* 11.0* 10.8*  HCT 33.5* 31.3* 34.8* 33.5*  PLT 165 218 218 227  APTT 28  --   --   --   LABPROT 14.4  --   --   --   INR 1.10  --   --   --   CREATININE 1.49* 1.28* 0.95 1.00    Estimated Creatinine Clearance: 39.8 mL/min (by C-G formula based on Cr of 1).   Medical History: Past Medical History  Diagnosis Date  . Emphysema     followed by Dr. Joya Gaskins  . S/P thoracic aortic aneurysm repair 1/10    also with bioprosthetic aortic valve prelacement [facility]). in 10/10 she has a descending thoraic aorta anuerysm and abdominal reapir [facility]). Vascular urgeon is Dr. Lunette Stands.   . Pulmonary embolism ([place]) 2010    She is no onger taking coumadin  . Hypothyroidism   . Blind right eye   . Hyperlipidemia   . S/P aortic valve replacement with bioprosthetic valve     ehco (8/11) with EF 55-60%,  mild-mod MR, mild-mod TR, poorly visualized bioprosthetic aortic valve but no significant regurgitation and gradient not significanly elevated.    . CKD (chronic kidney disease)   . HTN (hypertension)   . History of Doppler echocardiogram     carotid dopplers. (8/11) no significant stenosis  . COPD (chronic obstructive pulmonary disease) ([place])   . SVT (supraventricular tachycardia) ([facility])     HOLTER MONITOR AFTER SURGERY   . GERD (gastroesophageal reflux disease)   . Arthritis   . Cataract     RT  . Hx of echocardiogram 2015    Echo (08/2013):  EF 60-65%, no RWMA, Gr 1 DD, AVR ok (mean 5 mmHg), mild MR, PASP 22 mmHg    Medications:  Scheduled:  . aspirin EC  81 mg Oral Daily  . atorvastatin  20 mg Oral Daily  . calcium-vitamin D  1 tablet Oral Q breakfast  . donepezil  10 mg Oral QHS  . doxycycline  100 mg Oral Q12H  . folic acid  1 mg Oral Daily  . levothyroxine  112 mcg Oral QAC breakfast  . memantine  28 mg Oral Daily  . mometasone-formoterol  2 puff Inhalation BID  . multivitamin with minerals  1 tablet Oral Daily  . saccharomyces boulardii  250 mg Oral BID  . sertraline  25 mg Oral  Daily  . sodium chloride  3 mL Intravenous Q12H  . thiamine  100 mg Oral Daily  . vancomycin  125 mg Oral 4 times per day  . [START ON 03/03/2015] Vitamin D (Ergocalciferol)  50,000 Units Oral Q30 days    Assessment: 81 y.o. female with history of HTN, COPD, HLD, C Diff, presented on 10/11  with fevers up to 100.2 F and diarrhea.  Patient admitted for sepsis d/t bacterial vs/ c diff colitis.  Patient positive for RLE DVT pharmacy consulted to dose enoxaparin.  H/H low but stable Plts WNL Scr 1.0, CrCl ~30mls/min  Goal of Therapy:  Anti-Xa level 0.6-1 units/ml 4hrs after LMWH dose given Monitor platelets by anticoagulation protocol   Plan:  Enoxaparin 1mg /kg/dose (65mg ) SQ q12h CBC q72 hours Follow renal function   Dolly Rias RPh 02/07/2015, 4:35 PM Pager [phone]

## 2015-02-07 NOTE — Care Management Important Message (Signed)
Important Message  Patient Details  Name: Sarah Johnson MRN: [id number] Date of Birth: January 12, 1933   Medicare Important Message Given:  Yes-second notification given    Camillo Flaming 02/07/2015, 1:06 [place] Message  Patient Details  Name: Sarah Johnson MRN: [id number] Date of Birth: [birth year]   Medicare Important Message Given:  Yes-second notification given    Camillo Flaming 02/07/2015, 1:05 PM

## 2015-02-07 NOTE — Progress Notes (Signed)
PHARMACY NOTE -  C.diff Management  Pharmacy has been assisting with dosing of PO Vancomycin for likely C.diff colitis. Today is day #4 of 14 for Vancomycin 125 mg PO q6h.  Last doses scheduled for 10/25.  Further dosage adjustment appears unlikely at present.    Will sign off at this time.  Please reconsult if a change in clinical status warrants re-evaluation of dosage.   Hershal Coria, PharmD, BCPS Pager: [phone] 02/07/2015 9:38 AM

## 2015-02-07 NOTE — Progress Notes (Signed)
VASCULAR LAB PRELIMINARY  PRELIMINARY  PRELIMINARY  PRELIMINARY  Right lower extremity venous duplex completed.    Preliminary report:  Right:  DVT noted in a soleal vein in the mid calf.  No evidence of superficial thrombosis. Baker's cyst noted in the popliteal fossa.  Elster Corbello, RVT 02/07/2015, 2:41 PM

## 2015-02-07 NOTE — Progress Notes (Signed)
Reviewed lower extremity doppler, positive for RLE DVT. Placed on Lovenox per pharmacy.   Faye Ramsay, MD  [facility] Pager [phone]  If 7PM-7AM, please contact night-coverage [email or web address] Password [email or web address]

## 2015-02-08 DIAGNOSIS — I82401 Acute embolism and thrombosis of unspecified deep veins of right lower extremity: Secondary | ICD-10-CM | POA: Diagnosis not present

## 2015-02-08 LAB — BASIC METABOLIC PANEL
Anion gap: 8 (ref 5–15)
BUN: 10 mg/dL (ref 6–20)
CALCIUM: 8.7 mg/dL — AB (ref 8.9–10.3)
CHLORIDE: 107 mmol/L (ref 101–111)
CO2: 25 mmol/L (ref 22–32)
CREATININE: 0.96 mg/dL (ref 0.44–1.00)
GFR calc Af Amer: >60 mL/min (ref >60)
GFR calc non Af Amer: 54 mL/min — ABNORMAL LOW (ref >60)
Glucose, Bld: 87 mg/dL (ref 65–99)
Potassium: 3.7 mmol/L (ref 3.5–5.1)
Sodium: 140 mmol/L (ref 135–145)

## 2015-02-08 LAB — CBC
HEMATOCRIT: 33.8 % — AB (ref 36.0–46.0)
HEMOGLOBIN: 10.8 g/dL — AB (ref 12.0–15.0)
MCH: 28.6 pg (ref 26.0–34.0)
MCHC: 32 g/dL (ref 30.0–36.0)
MCV: 89.4 fL (ref 78.0–100.0)
Platelets: 205 10*3/uL (ref 150–400)
RBC: 3.78 MIL/uL — ABNORMAL LOW (ref 3.87–5.11)
RDW: 17.2 % — AB (ref 11.5–15.5)
WBC: 14 10*3/uL — ABNORMAL HIGH (ref 4.0–10.5)

## 2015-02-08 LAB — MAGNESIUM: Magnesium: 1.5 mg/dL — ABNORMAL LOW (ref 1.7–2.4)

## 2015-02-08 LAB — GLUCOSE, CAPILLARY: Glucose-Capillary: 78 mg/dL (ref 65–99)

## 2015-02-08 MED ORDER — MAGNESIUM SULFATE 2 GM/50ML IV SOLN
2.0000 g | Freq: Once | INTRAVENOUS | Status: AC
  Administered 2015-02-08: 2 g via INTRAVENOUS
  Filled 2015-02-08: qty 50

## 2015-02-08 NOTE — Progress Notes (Signed)
Physical Therapy Treatment Patient Details Name: Sarah Johnson MRN: [id number] DOB: 1933-02-15 Today's Date: 02/08/2015    History of Present Illness 81 y.o. female with history of HTN, COPD, HLD, C Diff, presented with fevers up to 100.2 F and diarrhea, diagnosed with LLL pneumonia.  R DVT found 02/07/15    PT Comments    Pt reports no pain at rest however with mobility increased to 8/10 in bilateral ankles.  Pt requiring min-mod assist for mobility at this time due to pain.  If family unable to assist upon d/c, may need increased care at home or possibly ST-SNF.   Follow Up Recommendations  Supervision/Assistance - 24 hour;Home health PT     Equipment Recommendations  Rolling walker with 5" wheels    Recommendations for Other Services       Precautions / Restrictions Precautions Precautions: Fall Precaution Comments: monitor sats    Mobility  Bed Mobility Overal bed mobility: Needs Assistance Bed Mobility: Supine to Sit     Supine to sit: Supervision;HOB elevated     General bed mobility comments: increased time and effort  Transfers Overall transfer level: Needs assistance Equipment used: None Transfers: Sit to/from Stand Sit to Stand: Mod assist         General transfer comment: verbal cues for safe technique, increased assist initially to rise and steady  Ambulation/Gait Ambulation/Gait assistance: Min assist Ambulation Distance (Feet): 26 Feet Assistive device: Rolling walker (2 wheeled) Gait Pattern/deviations: Step-through pattern;Decreased stride length     General Gait Details: verbal cues for WBing on UEs and using RW, pt reports 8/10 pain in ankles limiting distance, SpO2 upon return to recliner 88% room air however improved to 95% within 3 min rest   Stairs            Wheelchair Mobility    Modified Rankin (Stroke Patients Only)       Balance           Standing balance support: Bilateral upper extremity  supported Standing balance-Leahy Scale: Poor                      Cognition Arousal/Alertness: Awake/alert Behavior During Therapy: WFL for tasks assessed/performed Overall Cognitive Status: Within Functional Limits for tasks assessed (appropriate during session)                      Exercises      General Comments        Pertinent Vitals/Pain Pain Assessment: 0-10 Pain Score: 8  Pain Location: bilateral ankles Pain Descriptors / Indicators: Sore Pain Intervention(s): Limited activity within patient's tolerance;Monitored during session;Repositioned   SpO2 at rest on room air 98% SpO2 on room air after ambulating 88% SpO2 room air upon leaving room 95%    Home Living                      Prior Function            PT Goals (current goals can now be found in the care plan section) Progress towards PT goals: Progressing toward goals    Frequency  Min 3X/week    PT Plan Current plan remains appropriate    Co-evaluation             End of Session Equipment Utilized During Treatment: Gait belt Activity Tolerance: Patient limited by pain Patient left: in chair;with call bell/phone within reach;with chair alarm set     Time: [phone]  PT Time Calculation (min) (ACUTE ONLY): 20 min  Charges:  $Gait Training: 8-22 mins                    G Codes:      Danicia Terhaar,KATHrine E 02/22/2015, 3:25 PM Carmelia Bake, PT, DPT 22-Feb-2015 Pager: [phone]

## 2015-02-08 NOTE — Progress Notes (Signed)
Patient ID: Sarah Johnson, female   DOB: 05-07-1932, 81 y.o.   MRN: [id number]  [facility] PROGRESS NOTE  JACQUELEEN PULVER XKG:[id number] DOB: 08/01/1932 DOA: 02/04/2015 PCP: Jani Gravel, MD   Brief narrative:    81 y.o. female with history of HTN, COPD, HLD, C Diff, presented with fevers up to 100.2 F and diarrhea.  Assessment/Plan:    Principal Problem:   Sepsis ([place]) - criteria for sepsis met on admission and with source possibly related to C. Diff and LLL PNA - continue oral vancomycin for C. Diff and oral doxycycline for LLL PNA - WBC is still up but unchanged over the past 24 hours  - repeat CBC in AM  Active Problems:   LLL PNA - continue doxycycline day #4/7    Diarrhea - possibly related to C. Diff, stool study negative for toxin but positive for C. Diff ag on admission - given high suspicion for C. Diff, WBC > 20K, pt has been on oral vanc and WBC trended down since admission and diarrhea improved - repeat C. Diff test negative  - continue oral vancomycin as noted above day #5/7    RLE pain  - doppler positive for DVT  - started Lovenox per pharmacy  - d/w daughter at bedside     Chronic renal failure stage II - III - with baseline Cr 1.4 - 1.5 (7 months ago) - Cr is currently at baseline and WNL - BMP in AM    Thrombocytopenia - no signs of bleeding, resolved  - CBC in AM    Hypokalemia - supplemented and remains WNL this AM    Hypomagnesemia - still low and will need to supplement  - repeat in AM    Anemia of CKD stage II - III - drop in Hg since admission likely dilutional  - no signs of active bleeding, Hg stable in the past 48 hours  - CBC in AM  DVT prophylaxis - Heparin SQ  Code Status: Full.  Family Communication:  plan of care discussed with the patient and daughters at bedside, son present over the phone  Disposition Plan: Home when stable, likely by Monday   IV access:  Peripheral IV  Procedures and diagnostic studies:     Dg Chest 2 View 02/04/2015  Hazy left lower lobe airspace disease which may reflect atelectasis versus pneumonia.  Medical Consultants:  None  Other Consultants:  None  IAnti-Infectives:   Oral vancomycin 10/11 --> Doxycyline 10/12 -->   Faye Ramsay, MD  [facility] Pager [phone]  If 7PM-7AM, please contact night-coverage [email or web address] Password [email or web address] 02/08/2015, 12:35 PM   LOS: 4 days   HPI/Subjective: No events overnight.   Objective: Filed Vitals:   02/07/15 2017 02/07/15 2131 02/08/15 0455 02/08/15 0458  BP: 136/56  124/61   Pulse: 74  70   Temp: 99.2 F (37.3 C)  98.9 F (37.2 C)   TempSrc: Oral  Oral   Resp: 18  19   Height:      Weight:    65.7 kg (144 lb 13.5 oz)  SpO2: 99% 95% 96%     Intake/Output Summary (Last 24 hours) at 02/08/15 1235 Last data filed at 02/08/15 0455  Gross per 24 hour  Intake    600 ml  Output      0 ml  Net    600 ml    Exam:   General:  Pt is alert, follows commands appropriately, not in acute distress  Cardiovascular: Regular rate  and rhythm, no rubs, no gallops  Respiratory: Clear to auscultation bilaterally, no wheezing, diminished breath sounds at bases   Abdomen: Soft, non tender, non distended, bowel sounds present, no guarding   Data Reviewed: Basic Metabolic Panel:  Recent Labs Lab 02/04/15 2155 02/05/15 0245 02/06/15 0522 02/07/15 0527 02/08/15 0538  NA 137 140 140 140 140  K 3.0* 3.6 3.5 3.8 3.7  CL 107 116* 110 111 107  CO2 20* 20* 23 24 25  GLUCOSE 139* 103* 91 95 87  BUN 20 16 7 6 10  CREATININE 1.49* 1.28* 0.95 1.00 0.96  CALCIUM 8.1* 7.4* 8.1* 8.2* 8.7*  MG 1.3*  --  1.6* 1.8 1.5*   Liver Function Tests:  Recent Labs Lab 02/04/15 1708 02/04/15 2155 02/05/15 0245  AST 34 31 22  ALT 19 15 14  ALKPHOS 76 58 53  BILITOT 0.9 0.2* 0.6  PROT 7.6 6.3* 5.5*  ALBUMIN 4.0 3.1* 2.8*    Recent Labs Lab 02/04/15 1708  LIPASE 29   CBC:  Recent Labs Lab 02/04/15 1708  02/04/15 2155 02/05/15 0245 02/06/15 0522 02/07/15 0527 02/08/15 0538  WBC 26.4* 22.0* 20.5* 12.8* 14.0* 14.0*  NEUTROABS 21.4* 18.2*  --   --   --   --   HGB 13.0 10.7* 10.0* 11.0* 10.8* 10.8*  HCT 40.6 33.5* 31.3* 34.8* 33.5* 33.8*  MCV 89.2 89.8 90.5 89.9 89.8 89.4  PLT 128* 165 218 218 227 205   CBG:  Recent Labs Lab 02/05/15 0805 02/06/15 0829 02/07/15 0717 02/08/15 0753  GLUCAP 90 85 78 78   Recent Results (from the past 240 hour(s))  Blood culture (routine x 2)     Status: None (Preliminary result)   Collection Time: 02/04/15  5:23 PM  Result Value Ref Range Status   Specimen Description BLOOD LEFT ANTECUBITAL  Final   Special Requests BOTTLES DRAWN AEROBIC AND ANAEROBIC 5CC  Final   Culture   Final    NO GROWTH < 24 HOURS Performed at [facility]    Report Status PENDING  Incomplete  C difficile quick scan w PCR reflex     Status: Abnormal   Collection Time: 02/04/15  6:43 PM  Result Value Ref Range Status   C Diff antigen POSITIVE (A) NEGATIVE Final   C Diff toxin NEGATIVE NEGATIVE Final   C Diff interpretation   Final    C. difficile present, but toxin not detected. This indicates colonization. In most cases, this does not require treatment. If patient has signs and symptoms consistent with colitis, consider treatment. Requires ENTERIC precautions.  Blood culture (routine x 2)     Status: None (Preliminary result)   Collection Time: 02/04/15  9:55 PM  Result Value Ref Range Status   Specimen Description BLOOD RIGHT ARM  Final   Special Requests BOTTLES DRAWN AEROBIC AND ANAEROBIC 10 CC  Final   Culture   Final    NO GROWTH < 24 HOURS Performed at [facility]    Report Status PENDING  Incomplete    . aspirin EC  81 mg Oral Daily  . atorvastatin  20 mg Oral Daily  . calcium-vitamin D  1 tablet Oral Q breakfast  . donepezil  10 mg Oral QHS  . doxycycline  100 mg Oral Q12H  . enoxaparin (LOVENOX) injection  1 mg/kg Subcutaneous Q12H  .  folic acid  1 mg Oral Daily  . levothyroxine  112 mcg Oral QAC breakfast  . memantine  28   mg Oral Daily  . mometasone-formoterol  2 puff Inhalation BID  . multivitamin with minerals  1 tablet Oral Daily  . saccharomyces boulardii  250 mg Oral BID  . sertraline  25 mg Oral Daily  . sodium chloride  3 mL Intravenous Q12H  . thiamine  100 mg Oral Daily  . vancomycin  125 mg Oral 4 times per day  . [START ON 03/03/2015] Vitamin D (Ergocalciferol)  50,000 Units Oral Q30 days

## 2015-02-08 NOTE — Progress Notes (Signed)
Pt states both ankles hurt at joint when she dorsiflexes. Dr. Doyle Askew notified. Will continue to observe. Lind Guest, RN

## 2015-02-09 LAB — COMPREHENSIVE METABOLIC PANEL
ALBUMIN: 3.1 g/dL — AB (ref 3.5–5.0)
ALT: 20 U/L (ref 14–54)
ANION GAP: 7 (ref 5–15)
AST: 35 U/L (ref 15–41)
Alkaline Phosphatase: 55 U/L (ref 38–126)
BILIRUBIN TOTAL: 0.6 mg/dL (ref 0.3–1.2)
BUN: 12 mg/dL (ref 6–20)
CHLORIDE: 107 mmol/L (ref 101–111)
CO2: 25 mmol/L (ref 22–32)
Calcium: 9.1 mg/dL (ref 8.9–10.3)
Creatinine, Ser: 1 mg/dL (ref 0.44–1.00)
GFR calc Af Amer: >60 mL/min (ref >60)
GFR, EST NON AFRICAN AMERICAN: 52 mL/min — AB (ref >60)
GLUCOSE: 89 mg/dL (ref 65–99)
POTASSIUM: 3.7 mmol/L (ref 3.5–5.1)
Sodium: 139 mmol/L (ref 135–145)
TOTAL PROTEIN: 6.5 g/dL (ref 6.5–8.1)

## 2015-02-09 LAB — CBC
HCT: 34 % — ABNORMAL LOW (ref 36.0–46.0)
Hemoglobin: 10.9 g/dL — ABNORMAL LOW (ref 12.0–15.0)
MCH: 28.6 pg (ref 26.0–34.0)
MCHC: 32.1 g/dL (ref 30.0–36.0)
MCV: 89.2 fL (ref 78.0–100.0)
PLATELETS: 249 10*3/uL (ref 150–400)
RBC: 3.81 MIL/uL — AB (ref 3.87–5.11)
RDW: 17.1 % — AB (ref 11.5–15.5)
WBC: 13.9 10*3/uL — AB (ref 4.0–10.5)

## 2015-02-09 LAB — CULTURE, BLOOD (ROUTINE X 2)
Culture: NO GROWTH
Culture: NO GROWTH

## 2015-02-09 LAB — MAGNESIUM: MAGNESIUM: 1.7 mg/dL (ref 1.7–2.4)

## 2015-02-09 LAB — GLUCOSE, CAPILLARY: GLUCOSE-CAPILLARY: 86 mg/dL (ref 65–99)

## 2015-02-09 MED ORDER — RIVAROXABAN 15 MG PO TABS
15.0000 mg | ORAL_TABLET | Freq: Two times a day (BID) | ORAL | Status: DC
Start: 2015-02-09 — End: 2015-02-12
  Administered 2015-02-09 – 2015-02-12 (×7): 15 mg via ORAL
  Filled 2015-02-09 (×7): qty 1

## 2015-02-09 NOTE — Progress Notes (Signed)
Pt up to bedside commode with assistance. Pt unsteady and weak.  Upon returning to bed O2 sat =90% on room air. Stacey Drain

## 2015-02-09 NOTE — Progress Notes (Signed)
Patient ID: Sarah Johnson, female   DOB: 1932-09-22, 81 y.o.   MRN: [id number]  [facility] PROGRESS NOTE  SABRYNA LAHM XBD:[id number] DOB: [birth year] DOA: 02/04/2015 PCP: Jani Gravel, MD   Brief narrative:    81 y.o. female with history of HTN, COPD, HLD, C Diff, presented with fevers up to 100.2 F and diarrhea.  Assessment/Plan:    Principal Problem:   Sepsis ([place]) - criteria for sepsis met on admission and with source possibly related to C. Diff and LLL PNA - continue oral vancomycin for C. Diff and oral doxycycline for LLL PNA - WBC is still up and somewhat better overall - repeat CBC in AM - repeat CXR in AM to ensure PNA resolution   Active Problems:   LLL PNA - continue doxycycline day #5/7 - CXR in AM    Diarrhea - possibly related to C. Diff, stool study negative for toxin but positive for C. Diff ag on admission - given high suspicion for C. Diff, WBC > 20K, pt has been on oral vanc and WBC trended down since admission and diarrhea improved - repeat C. Diff test negative  - continue oral vancomycin as noted above day #6/7    RLE pain  - doppler positive for DVT  - started Lovenox per pharmacy  - d/w daughter at bedside  - will transition to Xarelto per pharmacy    Chronic renal failure stage II - III - with baseline Cr 1.4 - 1.5 (7 months ago) - Cr is currently at baseline and WNL - BMP in AM    Thrombocytopenia - no signs of bleeding, resolved  - CBC in AM    Hypokalemia - supplemented and remains WNL this AM    Hypomagnesemia - still low and will need to supplement  - repeat in AM    Anemia of CKD stage II - III - drop in Hg since admission likely dilutional  - no signs of active bleeding, Hg stable in the past 48 hours  - CBC in AM  DVT prophylaxis - Heparin SQ  Code Status: Full.  Family Communication:  plan of care discussed with the patient and daughters at bedside, son present over the phone  Disposition Plan: Home when  stable, likely by Monday   IV access:  Peripheral IV  Procedures and diagnostic studies:    Dg Chest 2 View 02/04/2015  Hazy left lower lobe airspace disease which may reflect atelectasis versus pneumonia.  Medical Consultants:  None  Other Consultants:  None  IAnti-Infectives:   Oral vancomycin 10/11 --> Doxycyline 10/12 -->   Faye Ramsay, MD  [facility] Pager [phone]  If 7PM-7AM, please contact night-coverage [email or web address] Password [email or web address] 02/09/2015, 1:29 PM   LOS: 5 days   HPI/Subjective: No events overnight.   Objective: Filed Vitals:   02/08/15 2100 02/08/15 2226 02/09/15 0505 02/09/15 0831  BP:  132/71 139/58   Pulse:  89 89   Temp:  99.1 F (37.3 C) 98.2 F (36.8 C)   TempSrc:  Oral Oral   Resp:  18 19   Height:      Weight:      SpO2: 92% 92% 91% 90%    Intake/Output Summary (Last 24 hours) at 02/09/15 1329 Last data filed at 02/09/15 0505  Gross per 24 hour  Intake    650 ml  Output      0 ml  Net    650 ml    Exam:   General:  Pt is  alert, follows commands appropriately, not in acute distress  Cardiovascular: Regular rate and rhythm, no rubs, no gallops  Respiratory: Clear to auscultation bilaterally, no wheezing, diminished breath sounds at bases   Abdomen: Soft, non tender, non distended, bowel sounds present, no guarding   Data Reviewed: Basic Metabolic Panel:  Recent Labs Lab 02/04/15 2155 02/05/15 0245 02/06/15 0522 02/07/15 0527 02/08/15 0538 02/09/15 0419  NA 137 140 140 140 140 139  K 3.0* 3.6 3.5 3.8 3.7 3.7  CL 107 116* 110 111 107 107  CO2 20* 20* _0 GLUCOSE 139* 103* 91 95 87 89  BUN _1 CREATININE 1.49* 1.28* 0.95 1.00 0.96 1.00  CALCIUM 8.1* 7.4* 8.1* 8.2* 8.7* 9.1  MG 1.3*  --  1.6* 1.8 1.5* 1.7   Liver Function Tests:  Recent Labs Lab 02/04/15 1708 02/04/15 2155 02/05/15 0245 02/09/15 0419  AST 34 31 22 35  ALT _2 ALKPHOS 76 58 53 55  BILITOT 0.9 0.2* 0.6 0.6   PROT 7.6 6.3* 5.5* 6.5  ALBUMIN 4.0 3.1* 2.8* 3.1*    Recent Labs Lab 02/04/15 1708  LIPASE 29   CBC:  Recent Labs Lab 02/04/15 1708 02/04/15 2155 02/05/15 0245 02/06/15 0522 02/07/15 0527 02/08/15 0538 02/09/15 0419  WBC 26.4* 22.0* 20.5* 12.8* 14.0* 14.0* 13.9*  NEUTROABS 21.4* 18.2*  --   --   --   --   --   HGB 13.0 10.7* 10.0* 11.0* 10.8* 10.8* 10.9*  HCT 40.6 33.5* 31.3* 34.8* 33.5* 33.8* 34.0*  MCV 89.2 89.8 90.5 89.9 89.8 89.4 89.2  PLT 128* 165 218 218 227 205 249   CBG:  Recent Labs Lab 02/05/15 0805 02/06/15 0829 02/07/15 0717 02/08/15 0753 02/09/15 0807  GLUCAP 90 85 78 78 86   Recent Results (from the past 240 hour(s))  Blood culture (routine x 2)     Status: None (Preliminary result)   Collection Time: 02/04/15  5:23 PM  Result Value Ref Range Status   Specimen Description BLOOD LEFT ANTECUBITAL  Final   Special Requests BOTTLES DRAWN AEROBIC AND ANAEROBIC 5CC  Final   Culture   Final    NO GROWTH < 24 HOURS Performed at [facility]    Report Status PENDING  Incomplete  C difficile quick scan w PCR reflex     Status: Abnormal   Collection Time: 02/04/15  6:43 PM  Result Value Ref Range Status   C Diff antigen POSITIVE (A) NEGATIVE Final   C Diff toxin NEGATIVE NEGATIVE Final   C Diff interpretation   Final    C. difficile present, but toxin not detected. This indicates colonization. In most cases, this does not require treatment. If patient has signs and symptoms consistent with colitis, consider treatment. Requires ENTERIC precautions.  Blood culture (routine x 2)     Status: None (Preliminary result)   Collection Time: 02/04/15  9:55 PM  Result Value Ref Range Status   Specimen Description BLOOD RIGHT ARM  Final   Special Requests BOTTLES DRAWN AEROBIC AND ANAEROBIC 10 CC  Final   Culture   Final    NO GROWTH < 24 HOURS Performed at [facility]    Report Status PENDING  Incomplete    . aspirin EC  81 mg Oral Daily   . atorvastatin  20 mg Oral Daily  . calcium-vitamin D  1 tablet Oral Q breakfast  . donepezil  10  mg Oral QHS  . doxycycline  100 mg Oral Q12H  . enoxaparin (LOVENOX) injection  1 mg/kg Subcutaneous Q12H  . folic acid  1 mg Oral Daily  . levothyroxine  112 mcg Oral QAC breakfast  . memantine  28 mg Oral Daily  . mometasone-formoterol  2 puff Inhalation BID  . multivitamin with minerals  1 tablet Oral Daily  . saccharomyces boulardii  250 mg Oral BID  . sertraline  25 mg Oral Daily  . sodium chloride  3 mL Intravenous Q12H  . thiamine  100 mg Oral Daily  . vancomycin  125 mg Oral 4 times per day  . [START ON 03/03/2015] Vitamin D (Ergocalciferol)  50,000 Units Oral Q30 days

## 2015-02-09 NOTE — Progress Notes (Signed)
ANTICOAGULATION CONSULT NOTE - Initial Consult  Pharmacy Consult for Xarelto Indication: VTE treatment  Allergies  Allergen Reactions  . Codeine Other (See Comments)    Pt. States it has been too long to remember the reaction   . Penicillins Other (See Comments)    Pt. Does not recall the reaction.  Has patient had a PCN reaction causing immediate rash, facial/tongue/throat swelling, SOB or lightheadedness with hypotension:  Has patient had a PCN reaction causing severe rash involving mucus membranes or skin necrosis: Has patient had a PCN reaction that required hospitalization  Has patient had a PCN reaction occurring within the last 10 years:  If all of the above answers are "NO", then may proceed with Cephalosporin use.     Patient Measurements: Height: 5\' 2"  (157.5 cm) Weight: 144 lb 13.5 oz (65.7 kg) IBW/kg (Calculated) : 50.1   Vital Signs: Temp: 98.2 F (36.8 C) (10/16 0505) Temp Source: Oral (10/16 0505) BP: 139/58 mmHg (10/16 0505) Pulse Rate: 89 (10/16 0505)  Labs:  Recent Labs  02/07/15 0527 02/08/15 0538 02/09/15 0419  HGB 10.8* 10.8* 10.9*  HCT 33.5* 33.8* 34.0*  PLT 227 205 249  CREATININE 1.00 0.96 1.00    Estimated Creatinine Clearance: 39.9 mL/min (by C-G formula based on Cr of 1).   Medical History: Past Medical History  Diagnosis Date  . Emphysema     followed by Dr. Joya Gaskins  . S/P thoracic aortic aneurysm repair 1/10    also with bioprosthetic aortic valve prelacement [facility]). in 10/10 she has a descending thoraic aorta anuerysm and abdominal reapir [facility]). Vascular urgeon is Dr. Lunette Stands.   . Pulmonary embolism ([place]) 2010    She is no onger taking coumadin  . Hypothyroidism   . Blind right eye   . Hyperlipidemia   . S/P aortic valve replacement with bioprosthetic valve     ehco (8/11) with EF 55-60%, mild-mod MR, mild-mod TR, poorly visualized bioprosthetic aortic valve but no significant regurgitation and gradient not  significanly elevated.    . CKD (chronic kidney disease)   . HTN (hypertension)   . History of Doppler echocardiogram     carotid dopplers. (8/11) no significant stenosis  . COPD (chronic obstructive pulmonary disease) ([place])   . SVT (supraventricular tachycardia) (H[facility])     HOLTER MONITOR AFTER SURGERY   . GERD (gastroesophageal reflux disease)   . Arthritis   . Cataract     RT  . Hx of echocardiogram 2015    Echo (08/2013):  EF 60-65%, no RWMA, Gr 1 DD, AVR ok (mean 5 mmHg), mild MR, PASP 22 mmHg    Medications:  Scheduled:  . aspirin EC  81 mg Oral Daily  . atorvastatin  20 mg Oral Daily  . calcium-vitamin D  1 tablet Oral Q breakfast  . donepezil  10 mg Oral QHS  . doxycycline  100 mg Oral Q12H  . folic acid  1 mg Oral Daily  . levothyroxine  112 mcg Oral QAC breakfast  . memantine  28 mg Oral Daily  . mometasone-formoterol  2 puff Inhalation BID  . multivitamin with minerals  1 tablet Oral Daily  . saccharomyces boulardii  250 mg Oral BID  . sertraline  25 mg Oral Daily  . sodium chloride  3 mL Intravenous Q12H  . thiamine  100 mg Oral Daily  . vancomycin  125 mg Oral 4 times per day  . [START ON 03/03/2015] Vitamin D (Ergocalciferol)  50,000 Units Oral Q30  days    Assessment: 80 y.o. female with history of HTN, COPD, HLD, C Diff, presented on 10/11  with fevers up to 100.2 F and diarrhea.  Patient admitted for sepsis d/t bacterial vs/ c diff colitis.  Patient positive for RLE DVT pharmacy consulted to dose enoxaparin on 10/14 now to transition to [place] on 10/16. Labs continue to be stable  Goal of Therapy:  Anti-Xa level 0.6-1 units/ml 4hrs after LMWH dose given Monitor platelets by anticoagulation protocol   Plan:  1) Discontinue Lovenox 65mg  q12 - last dose 10/16 at 0500 2) Start Xarelto 15mg  PO BID x 21 doses this evening at 1700 3) After 21 doses of 15mg  PO BID, begin Xarelto 20mg  once daily 4) Pharmacy will provide education prior to discharge   Adrian Saran, PharmD, BCPS Pager [phone] 02/09/2015 1:52 PM

## 2015-02-10 ENCOUNTER — Inpatient Hospital Stay (HOSPITAL_COMMUNITY): Payer: Medicare Other

## 2015-02-10 LAB — BASIC METABOLIC PANEL
Anion gap: 7 (ref 5–15)
BUN: 12 mg/dL (ref 6–20)
CHLORIDE: 107 mmol/L (ref 101–111)
CO2: 26 mmol/L (ref 22–32)
CREATININE: 1.07 mg/dL — AB (ref 0.44–1.00)
Calcium: 9.5 mg/dL (ref 8.9–10.3)
GFR calc Af Amer: 55 mL/min — ABNORMAL LOW (ref >60)
GFR calc non Af Amer: 48 mL/min — ABNORMAL LOW (ref >60)
GLUCOSE: 90 mg/dL (ref 65–99)
POTASSIUM: 4 mmol/L (ref 3.5–5.1)
SODIUM: 140 mmol/L (ref 135–145)

## 2015-02-10 LAB — CBC
HCT: 34.2 % — ABNORMAL LOW (ref 36.0–46.0)
HEMOGLOBIN: 10.8 g/dL — AB (ref 12.0–15.0)
MCH: 27.9 pg (ref 26.0–34.0)
MCHC: 31.6 g/dL (ref 30.0–36.0)
MCV: 88.4 fL (ref 78.0–100.0)
PLATELETS: 276 10*3/uL (ref 150–400)
RBC: 3.87 MIL/uL (ref 3.87–5.11)
RDW: 16.9 % — ABNORMAL HIGH (ref 11.5–15.5)
WBC: 11.5 10*3/uL — ABNORMAL HIGH (ref 4.0–10.5)

## 2015-02-10 LAB — GLUCOSE, CAPILLARY: Glucose-Capillary: 88 mg/dL (ref 65–99)

## 2015-02-10 MED ORDER — HYDRALAZINE HCL 20 MG/ML IJ SOLN: 5.0000 mg | INTRAMUSCULAR | Status: DC | PRN

## 2015-02-10 NOTE — Clinical Social Work Note (Signed)
Clinical Social Work Assessment  Patient Details  Name: Sarah Johnson MRN: [id number] Date of Birth: 1933/01/03  Date of referral:  02/10/15               Reason for consult:  Discharge Planning                Permission sought to share information with:  Family Supports Permission granted to share information::     Name::     Sarah Johnson  Agency::     Relationship::  [phone]  Contact Information:  daughter  Housing/Transportation Living arrangements for the past 2 months:  Single Family Home Source of Information:  Adult Children Patient Interpreter Needed:  None Criminal Activity/Legal Involvement Pertinent to Current Situation/Hospitalization:  No - Comment as needed Significant Relationships:  Adult Children Lives with:  Self Do you feel safe going back to the place where you live?  No Need for family participation in patient care:  Yes (Comment)  Care giving concerns:  Pt from home alone. Pt confused. PT recommending pt to have 24 hour care and if 24 hour care is not available then pt may need ST SNF.   Social Worker assessment / plan:  CSW received referral for New SNF.  CSW spoke with pt daughter, Sarah Johnson via telephone as pt confused. Pt daughter, Sarah Johnson reported that pt lives alone. Pt daughter discussed that pt has expressed that she does not want to go to a facility and pt daughter has contacted pt son in [place] and hopeful that pt son will be able to come stay with pt upon pt discharge from the hospital, but pt daughter is still awaiting confirmation from pt son to determine if pt son will be able to rearrange his schedule to come stay with. Pt daughter had mentioned to [facility] about [facility]. CSW inquired with pt daughter if pt daughter was aware that [facility] would be private pay. Pt daughter stated that she was not aware and does not wish to pay privately for ALF. CSW discussed that pt Medicare will cover short term rehab at [facility]. Pt daughter agreeable to  SNF search for short term rehab. CSW explained process and clarified pt daughter's questions.  CSW completed FL2 and initiated SNF search to [facility].  CSW to follow up with pt daughter with bed offers. Pt daughter remains hopeful that pt son can come stay with pt, but wants to have SNF options in case pt son is not able to come to [place] by the time pt d/c from hospital.  CSW to continue to follow to provide support and assist with pt disposition needs.   Employment status:  Retired Health visitor PT Recommendations:  Home with [place], 24 Hour Supervision (pt does not have 24 hour care) Information / Referral to community resources:  [place]  Patient/Family's Response to care:  Per care team, pt confused. Pt daughter, Sarah Johnson supportive and actively involved in pt care. Pt daughter discussed that pt prefers to go home and pt family hopeful to arrange care at home. Pt daughter wants to have plan B in place if family care can not be arranged.  Patient/Family's Understanding of and Emotional Response to Diagnosis, Current Treatment, and Prognosis:  Pt daughter displayed knowledge surrounding pt diagnosis and treatment plan.   Emotional Assessment Appearance:  Appears stated age Attitude/Demeanor/Rapport:  Other (pt with memory impairment) Affect (typically observed):    Orientation:  Oriented to Self, Oriented to Place, Oriented  to  Time, Oriented to Situation (pt with memory impairment) Alcohol / Substance use:  Not Applicable Psych involvement (Current and /or in the community):  No (Comment)  Discharge Needs  Concerns to be addressed:  Discharge Planning Concerns Readmission within the last 30 days:  No Current discharge risk:  Lives alone Barriers to Discharge:  Continued Medical Work up   Sarah Johnson A, [place] 02/10/2015, 3:36 PM  [phone]

## 2015-02-10 NOTE — Discharge Instructions (Signed)
Information on my medicine - XARELTO (rivaroxaban)  This medication education was reviewed with me or my healthcare representative as part of my discharge preparation.  The pharmacist that spoke with me during my hospital stay was:  Clovis Riley, [place]? Xarelto was prescribed to treat blood clots that may have been found in the veins of your legs (deep vein thrombosis) or in your lungs (pulmonary embolism) and to reduce the risk of them occurring again.  What do you need to know about Xarelto? The starting dose is one 15 mg tablet taken TWICE daily with food for the FIRST 21 DAYS then on March 03, 2015  the dose is changed to one 20 mg tablet taken ONCE A DAY with your evening meal.  DO NOT stop taking Xarelto without talking to the health care provider who prescribed the medication.  Refill your prescription for 20 mg tablets before you run out.  After discharge, you should have regular check-up appointments with your healthcare provider that is prescribing your Xarelto.  In the future your dose may need to be changed if your kidney function changes by a significant amount.  What do you do if you miss a dose? If you are taking Xarelto TWICE DAILY and you miss a dose, take it as soon as you remember. You may take two 15 mg tablets (total 30 mg) at the same time then resume your regularly scheduled 15 mg twice daily the next day.  If you are taking Xarelto ONCE DAILY and you miss a dose, take it as soon as you remember on the same day then continue your regularly scheduled once daily regimen the next day. Do not take two doses of Xarelto at the same time.   Important Safety Information Xarelto is a blood thinner medicine that can cause bleeding. You should call your healthcare provider right away if you experience any of the following: ? Bleeding from an injury or your nose that does not stop. ? Unusual colored urine (red or dark brown) or  unusual colored stools (red or black). ? Unusual bruising for unknown reasons. ? A serious fall or if you hit your head (even if there is no bleeding).  Some medicines may interact with Xarelto and might increase your risk of bleeding while on Xarelto. To help avoid this, consult your healthcare provider or pharmacist prior to using any new prescription or non-prescription medications, including herbals, vitamins, non-steroidal anti-inflammatory drugs (NSAIDs) and supplements.  This website has more information on Xarelto: [email or web address]

## 2015-02-10 NOTE — Progress Notes (Signed)
Spoke with pt's HCPOA Philippa Sicks [phone]) daughter at bedside, concerning discharge plan related to the pt having Dementia. [name] would like pt to go to [facility].

## 2015-02-10 NOTE — Progress Notes (Signed)
PT Cancellation Note  Patient Details Name: Sarah Johnson MRN: [id number] DOB: 07-16-1932   Cancelled Treatment:    Reason Eval/Treat Not Completed: Other (comment) Pt reports not feeling well today and pain with moving her "feet up/down".     Marcellus Pulliam,KATHrine E 02/10/2015, 10:32 AM Carmelia Bake, PT, DPT 02/10/2015 Pager: [phone]

## 2015-02-10 NOTE — Progress Notes (Signed)
Patient ID: Sarah Johnson, female   DOB: 05-31-1932, 81 y.o.   MRN: [id number]  [facility] PROGRESS NOTE  FLOR WHITACRE YTK:[id number] DOB: Apr 20, 1933 DOA: 02/04/2015 PCP: Jani Gravel, MD   Brief narrative:    81 y.o. female with history of HTN, COPD, HLD, C Diff, presented with fevers up to 100.2 F and diarrhea.  Assessment/Plan:    Principal Problem:   Sepsis ([place]) - criteria for sepsis met on admission and with source possibly related to C. Diff and LLL PNA - continue oral vancomycin for C. Diff and oral doxycycline for LLL PNA - WBC is still up but overall trending down, today is day #6/7 - repeat CXR 10/17 shows resolution of previously seen LLL PNA  Active Problems:   LLL PNA - continue doxycycline day #6/7 as noted above    Headache 10/17  - with more intermittent confusion episodes - CT head with remote lacunar stroke on the left side but MRI with no acute strokes - allow tylenol as needed as both scans are unremarkable for acute etiology that would explain headache  - noted slightly elevated BP this AM so possibly contributing to headache - will add hydralazine as needed     Diarrhea present on admission  - possibly related to C. Diff, stool study negative for toxin but positive for C. Diff ag on admission - given high suspicion for C. Diff, WBC > 20K, pt has been on oral vanc and WBC trended down since admission and diarrhea improved - repeat C. Diff test negative  - continue oral vancomycin as noted above day #7/7, stop after today's dose     RLE pain 02/06/2015 - doppler positive for DVT 10/13 - pt initially started on Lovenox but lives at home alone so not able to give herself shots  - transitioned to Xarelto  - of note, pt has been on aspirin for bioprosthetic valve, paged cardiology to discuss if OK to stop, awaiting call back     Bilateral ankle pain - unclear etiology - we can try putting ice packs on the area but no specific findings on  physical exam    Chronic renal failure stage II - III - with baseline Cr 1.4 - 1.5 (7 months ago) - Cr is currently at baseline  - BMP in AM    Thrombocytopenia - no signs of bleeding, resolved and Plt now remain WNL  - CBC in AM    Hypokalemia - supplemented and remains WNL this AM    Hypomagnesemia - supplemented and WNL 10/16    Anemia of CKD stage II - III - drop in Hg since admission likely dilutional  - no signs of active bleeding, Hg stable in the past 78 hours   DVT prophylaxis - pt on Xarelto for RLE DVT  Code Status: Full.  Family Communication:  plan of care discussed with the patient and daughters at bedside, son present over the phone  Disposition Plan: Barrier to discharge - today headache and CT head with remote lacunar stroke, MRI with no acute finding but pt unable to bear weight and participate in PT. Will attempt again PT in AM, ? Need for SNF   IV access:  Peripheral IV  Procedures and diagnostic studies:    Dg Chest 2 View 02/04/2015  Hazy left lower lobe airspace disease which may reflect atelectasis versus pneumonia.  Medical Consultants:  None  Other Consultants:  None  IAnti-Infectives:   Oral vancomycin 10/11 --> 10/17 Doxycyline 10/12 -->  10/18  Faye Ramsay, MD  [facility] Pager [phone]  If 7PM-7AM, please contact night-coverage [email or web address] Password [email or web address] 02/10/2015, 10:40 AM   LOS: 6 days   HPI/Subjective: No events overnight. Bilateral ankle pain.   Objective: Filed Vitals:   02/09/15 1903 02/09/15 2048 02/10/15 0646 02/10/15 0900  BP:  129/51 136/55 161/66  Pulse:  83 73 77  Temp:  98.1 F (36.7 C) 97.7 F (36.5 C) 97.8 F (36.6 C)  TempSrc:  Oral Oral Oral  Resp:  19 18   Height:      Weight:   62 kg (136 lb 11 oz)   SpO2: 92% 93% 92%     Intake/Output Summary (Last 24 hours) at 02/10/15 1040 Last data filed at 02/10/15 0841  Gross per 24 hour  Intake    360 ml  Output      0 ml  Net    360 ml     Exam:   General:  Pt is alert, follows commands appropriately, not in acute distress, tired   Cardiovascular: Regular rate and rhythm, no rubs, no gallops  Respiratory: Clear to auscultation bilaterally, no wheezing, diminished breath sounds at bases   Abdomen: Soft, non tender, non distended, bowel sounds present, no guarding   Data Reviewed: Basic Metabolic Panel:  Recent Labs Lab 02/04/15 2155  02/06/15 0522 02/07/15 0527 02/08/15 0538 02/09/15 0419 02/10/15 0532  NA 137  < > 140 140 140 139 140  K 3.0*  < > 3.5 3.8 3.7 3.7 4.0  CL 107  < > 110 111 107 107 107  CO2 20*  < > _0 GLUCOSE 139*  < > 91 95 87 89 90  BUN 20  < > _1 CREATININE 1.49*  < > 0.95 1.00 0.96 1.00 1.07*  CALCIUM 8.1*  < > 8.1* 8.2* 8.7* 9.1 9.5  MG 1.3*  --  1.6* 1.8 1.5* 1.7  --   < > = values in this interval not displayed. Liver Function Tests:  Recent Labs Lab 02/04/15 1708 02/04/15 2155 02/05/15 0245 02/09/15 0419  AST 34 31 22 35  ALT _2 ALKPHOS 76 58 53 55  BILITOT 0.9 0.2* 0.6 0.6  PROT 7.6 6.3* 5.5* 6.5  ALBUMIN 4.0 3.1* 2.8* 3.1*    Recent Labs Lab 02/04/15 1708  LIPASE 29   CBC:  Recent Labs Lab 02/04/15 1708 02/04/15 2155  02/06/15 0522 02/07/15 0527 02/08/15 0538 02/09/15 0419 02/10/15 0532  WBC 26.4* 22.0*  < > 12.8* 14.0* 14.0* 13.9* 11.5*  NEUTROABS 21.4* 18.2*  --   --   --   --   --   --   HGB 13.0 10.7*  < > 11.0* 10.8* 10.8* 10.9* 10.8*  HCT 40.6 33.5*  < > 34.8* 33.5* 33.8* 34.0* 34.2*  MCV 89.2 89.8  < > 89.9 89.8 89.4 89.2 88.4  PLT 128* 165  < > 218 227 205 249 276  < > = values in this interval not displayed. CBG:  Recent Labs Lab 02/06/15 0829 02/07/15 0717 02/08/15 0753 02/09/15 0807 02/10/15 0730  GLUCAP 85 78 78 86 88   Recent Results (from the past 240 hour(s))  Blood culture (routine x 2)     Status: None (Preliminary result)   Collection Time: 02/04/15  5:23 PM  Result Value Ref Range  Status   Specimen Description BLOOD LEFT ANTECUBITAL  Final   Special Requests BOTTLES DRAWN  AEROBIC AND ANAEROBIC 5CC  Final   Culture   Final    NO GROWTH < 24 HOURS Performed at [facility]    Report Status PENDING  Incomplete  C difficile quick scan w PCR reflex     Status: Abnormal   Collection Time: 02/04/15  6:43 PM  Result Value Ref Range Status   C Diff antigen POSITIVE (A) NEGATIVE Final   C Diff toxin NEGATIVE NEGATIVE Final   C Diff interpretation   Final    C. difficile present, but toxin not detected. This indicates colonization. In most cases, this does not require treatment. If patient has signs and symptoms consistent with colitis, consider treatment. Requires ENTERIC precautions.  Blood culture (routine x 2)     Status: None (Preliminary result)   Collection Time: 02/04/15  9:55 PM  Result Value Ref Range Status   Specimen Description BLOOD RIGHT ARM  Final   Special Requests BOTTLES DRAWN AEROBIC AND ANAEROBIC 10 CC  Final   Culture   Final    NO GROWTH < 24 HOURS Performed at [facility]    Report Status PENDING  Incomplete    . aspirin EC  81 mg Oral Daily  . atorvastatin  20 mg Oral Daily  . calcium-vitamin D  1 tablet Oral Q breakfast  . donepezil  10 mg Oral QHS  . doxycycline  100 mg Oral Q12H  . folic acid  1 mg Oral Daily  . levothyroxine  112 mcg Oral QAC breakfast  . memantine  28 mg Oral Daily  . mometasone-formoterol  2 puff Inhalation BID  . multivitamin with minerals  1 tablet Oral Daily  . Rivaroxaban  15 mg Oral BID WC  . saccharomyces boulardii  250 mg Oral BID  . sertraline  25 mg Oral Daily  . sodium chloride  3 mL Intravenous Q12H  . thiamine  100 mg Oral Daily  . vancomycin  125 mg Oral 4 times per day  . [START ON 03/03/2015] Vitamin D (Ergocalciferol)  50,000 Units Oral Q30 days

## 2015-02-10 NOTE — Clinical Social Work Placement (Signed)
   CLINICAL SOCIAL WORK PLACEMENT  NOTE  Date:  02/10/2015  Patient Details  Name: Sarah Johnson MRN: [id number] Date of Birth: May 12, 1932  Clinical Social Work is seeking post-discharge placement for this patient at the [place] level of care (*CSW will initial, date and re-position this form in  chart as items are completed):  Yes   Patient/family provided with [place] Work Department's list of facilities offering this level of care within the geographic area requested by the patient (or if unable, by the patient's family).  Yes   Patient/family informed of their freedom to choose among providers that offer the needed level of care, that participate in Medicare, Medicaid or managed care program needed by the patient, have an available bed and are willing to accept the patient.  Yes   Patient/family informed of [place]'s ownership interest in [facility] and [facility], as well as of the fact that they are under no obligation to receive care at these facilities.  PASRR submitted to EDS on       PASRR number received on       Existing PASRR number confirmed on 02/10/15     FL2 transmitted to all facilities in geographic area requested by pt/family on 02/10/15     FL2 transmitted to all facilities within larger geographic area on       Patient informed that his/her managed care company has contracts with or will negotiate with certain facilities, including the following:            Patient/family informed of bed offers received.  Patient chooses bed at       Physician recommends and patient chooses bed at      Patient to be transferred to   on  .  Patient to be transferred to facility by       Patient family notified on   of transfer.  Name of family member notified:        PHYSICIAN Please sign FL2     Additional Comment:    _______________________________________________ Ladell Pier, LCSW 02/10/2015, 3:43  PM

## 2015-02-10 NOTE — Care Management Important Message (Signed)
Important Message  Patient Details  Name: Sarah Johnson MRN: [id number] Date of Birth: 05/20/1932   Medicare Important Message Given:  Yes-third notification given    Camillo Flaming 02/10/2015, 12:37 [place] Message  Patient Details  Name: Sarah Johnson MRN: [id number] Date of Birth: 17-Sep-1932   Medicare Important Message Given:  Yes-third notification given    Camillo Flaming 02/10/2015, 12:37 PM

## 2015-02-11 ENCOUNTER — Inpatient Hospital Stay (HOSPITAL_COMMUNITY): Payer: Medicare Other

## 2015-02-11 DIAGNOSIS — D72829 Elevated white blood cell count, unspecified: Secondary | ICD-10-CM | POA: Diagnosis present

## 2015-02-11 DIAGNOSIS — E876 Hypokalemia: Secondary | ICD-10-CM | POA: Diagnosis present

## 2015-02-11 DIAGNOSIS — I82491 Acute embolism and thrombosis of other specified deep vein of right lower extremity: Secondary | ICD-10-CM

## 2015-02-11 LAB — BASIC METABOLIC PANEL
Anion gap: 8 (ref 5–15)
BUN: 16 mg/dL (ref 6–20)
CHLORIDE: 105 mmol/L (ref 101–111)
CO2: 27 mmol/L (ref 22–32)
CREATININE: 1.06 mg/dL — AB (ref 0.44–1.00)
Calcium: 9.6 mg/dL (ref 8.9–10.3)
GFR calc Af Amer: 56 mL/min — ABNORMAL LOW (ref >60)
GFR calc non Af Amer: 48 mL/min — ABNORMAL LOW (ref >60)
GLUCOSE: 91 mg/dL (ref 65–99)
POTASSIUM: 3.8 mmol/L (ref 3.5–5.1)
Sodium: 140 mmol/L (ref 135–145)

## 2015-02-11 LAB — CBC
HCT: 33.5 % — ABNORMAL LOW (ref 36.0–46.0)
HEMOGLOBIN: 10.8 g/dL — AB (ref 12.0–15.0)
MCH: 28.6 pg (ref 26.0–34.0)
MCHC: 32.2 g/dL (ref 30.0–36.0)
MCV: 88.9 fL (ref 78.0–100.0)
Platelets: 293 10*3/uL (ref 150–400)
RBC: 3.77 MIL/uL — AB (ref 3.87–5.11)
RDW: 16.9 % — ABNORMAL HIGH (ref 11.5–15.5)
WBC: 11.7 10*3/uL — ABNORMAL HIGH (ref 4.0–10.5)

## 2015-02-11 LAB — GLUCOSE, CAPILLARY: Glucose-Capillary: 86 mg/dL (ref 65–99)

## 2015-02-11 LAB — MAGNESIUM: Magnesium: 1.4 mg/dL — ABNORMAL LOW (ref 1.7–2.4)

## 2015-02-11 MED ORDER — MAGNESIUM SULFATE 2 GM/50ML IV SOLN
2.0000 g | Freq: Once | INTRAVENOUS | Status: AC
  Administered 2015-02-11: 2 g via INTRAVENOUS
  Filled 2015-02-11 (×2): qty 50

## 2015-02-11 NOTE — Progress Notes (Signed)
Pt's daughter Sarah Johnson had left pt's room. A call was made to Sarah Johnson [phone]) pt's daughter and HCPOA for discharge plan and [facility] agency. Will follow up in AM.

## 2015-02-11 NOTE — Progress Notes (Signed)
CSW continuing to follow for pt disposition planning.  CSW followed up with pt daughter, [name] at bedside. Pt daughter expressed anxiousness surround disposition planning as pt daughter discussed that she felt that she was getting conflicting information about pt discharge date. Pt daughter wants to ensure pt care is being met while in the hospital and eager for pt to work with PT as pt has not been up in a number of days. Pt has met with unit assistant director surrounding concerns. Pt daughter reports that pt son plans to come from [place] and stay one week to assist pt. Pt daughter expressed that she wants to ensure that she is aware of plan in order to communicate plan to pt son as pt son has to drive five hours to get to [facility] to assist pt. CSW provided SNF bed offers for pt daughter to have as a resource and if anything changes before pt discharge to home.  At this time, plan is for pt to discharge home with home health services and care from pt son. RNCM aware.  CSW to continue to follow to provide support.  Alison Murray, MSW, [place] Work [phone]

## 2015-02-11 NOTE — Clinical Social Work Placement (Signed)
   CLINICAL SOCIAL WORK PLACEMENT  NOTE  Date:  02/11/2015  Patient Details  Name: Sarah Johnson MRN: [id number] Date of Birth: 1932-06-28  Clinical Social Work is seeking post-discharge placement for this patient at the [place] level of care (*CSW will initial, date and re-position this form in  chart as items are completed):  Yes   Patient/family provided with [place] Work Department's list of facilities offering this level of care within the geographic area requested by the patient (or if unable, by the patient's family).  Yes   Patient/family informed of their freedom to choose among providers that offer the needed level of care, that participate in Medicare, Medicaid or managed care program needed by the patient, have an available bed and are willing to accept the patient.  Yes   Patient/family informed of [place]'s ownership interest in [facility] and [facility], as well as of the fact that they are under no obligation to receive care at these facilities.  PASRR submitted to EDS on       PASRR number received on       Existing PASRR number confirmed on 02/10/15     FL2 transmitted to all facilities in geographic area requested by pt/family on 02/10/15     FL2 transmitted to all facilities within larger geographic area on       Patient informed that his/her managed care company has contracts with or will negotiate with certain facilities, including the following:        Yes   Patient/family informed of bed offers received.  Patient chooses bed at       Physician recommends and patient chooses bed at      Patient to be transferred to   on  .  Patient to be transferred to facility by       Patient family notified on   of transfer.  Name of family member notified:        PHYSICIAN Please sign FL2     Additional Comment:    _______________________________________________ Ladell Pier, LCSW 02/11/2015, 5:07  PM

## 2015-02-11 NOTE — Progress Notes (Signed)
Patient ID: JACE DOWE, female   DOB: February 05, 1933, 81 y.o.   MRN: [id number] [facility] PROGRESS NOTE  DARLIN STENSETH ERD:[id number] DOB: 1932-06-25 DOA: 02/04/2015 PCP: Jani Gravel, MD  Brief narrative:    81 y.o. female with history of HTN, COPD, HLD, C Diff (in 06/2014) who presented to [facility] with fevers up to 100.2 F and diarrhea. On this admission her C.diff antigen was positive ut toxin was negative. She was however treated with PO vanco because of diarrhea. During this hospital stay she was found to have right LE pain and was subsequently found to have DVT for which reason she was placed on xarelto. Further, she was found to have left lower lobe pneumonia for which she was treated empirically with doxycycline.  Barrier to discharge: Thought that the pt may be discharged today but her daughter was extremely upset and said  "PT has not even worked with my mother. She cant bear weight on her legs at all. Her foot is swollen and she cant be discharged anywhere. We dont want SNF, we want to take her home but not the way she looks right now". Case management helping with discharge planning.   Assessment/Plan:    Principal Problem:  Sepsis secondary to lower lobe pneumonia / Leukocytosis - Sepsis criteria met on admission. Source of infection likely left lower lobe pneumonia seen on admission CXR. - Pt treated appropriately with doxycyline which is stopped today. - X ray repeated 10/17 showed resolution on basilar scaring on left  - WBC count still slightly up but stable at 11.7  Active Problems:  Diarrhea present on admission  - Pt has had C.diff back in 06/2014 - On this admission, she had antigen positive nut no toxin C.diff - She was treated empirically with PO vanco for C.diff considering ongoing diarrhea and he admission for sepsis - Diarrhea subsided to 2 BM's in past 24 hours   Headache 10/17 / Confusion - Family thought confusion due to stroke however pt probably has  underlying dementia. Se has had MMSE in neurology office in 12/2014 with MMSE of 22 which fall into category of mild dementia. - CT head with remote lacunar stroke on the left side but MRI with no acute strokes. I have explained this to pt's daughter extensively - We will continue xarelto but not aspirin so to minimize risk of bleed    RLE pain 02/06/2015 / right foot pain / left leg pain  - Doppler positive for DVT 10/13 - Pt initially started on Lovenox but lives at home alone so not able to give herself shots  - Transitioned to Xarelto  - Follow x ray results  - Follow upon PT evaluation    Chronic renal failure stage II - III - Baseline Cr 1.4 - 1.5 (7 months ago) - Cr is currently within baseline range    Thrombocytopenia - Spontaneously resolved    Hypokalemia - Supplemented and remains WNL    Hypomagnesemia - Supplemented and WNL 10/16   Anemia of CKD stage II - III - Drop in Hg since admission likely dilutional  - Hemoglobin stable at 10.8 - No current indications for transfusion   DVT Prophylaxis  - On Xarelto   Code Status: Full.  Family Communication:  plan of care discussed with the patient and her daughter  Disposition Plan: likely tomorrow but family somewhat frustrated with the care pt receiving and resistant to discharge.   IV access:  Peripheral IV  Procedures and diagnostic studies:  Ct Head Wo Contrast 02/10/2015  1. Subacute or chronic left thalamic lacunar infarct. 2.  Otherwise, no acute intracranial pathology. Electronically Signed   By: Kathreen Devoid   On: 02/10/2015 12:30   Mr Brain Wo Contrast 02/10/2015  Atrophy and small vessel disease.  No acute intracranial findings. No findings in the extracranial soft tissues to suggest a cause for headaches. Electronically Signed   By: Staci Righter M.D.   On: 02/10/2015 19:08   Dg Abd Acute W/chest 02/10/2015   No acute findings in the abdomen.  No free air or obstruction. Chronic left basilar  scarring.  Borderline cardiomegaly.   Dg Chest 2 View 02/04/2015 Hazy left lower lobe airspace disease which may reflect atelectasis versus pneumonia.  Medical Consultants:   Other Consultants:  Physical therapy WOC  IAnti-Infectives:   Oral vancomycin 10/11 --> 10/17 Doxycyline 10/12 --> 10/18  [name], MD  [facility] Pager [phone]  Time spent in minutes: 25 minutes  If 7PM-7AM, please contact night-coverage [email or web address] Password [email or web address] 02/11/2015, 2:03 PM   LOS: 7 days    HPI/Subjective: No acute overnight events. Patient reports pain in right foot.  Objective: Filed Vitals:   02/11/15 0425 02/11/15 0500 02/11/15 0741 02/11/15 1402  BP: 125/56   135/58  Pulse: 69   83  Temp: 98 F (36.7 C)   98.7 F (37.1 C)  TempSrc: Oral   Oral  Resp: 17   16  Height:      Weight:  61.67 kg (135 lb 15.3 oz)    SpO2: 99%  90% 94%    Intake/Output Summary (Last 24 hours) at 02/11/15 1403 Last data filed at 02/11/15 1403  Gross per 24 hour  Intake    960 ml  Output      0 ml  Net    960 ml    Exam:   General:  Pt is alert, not in acute distress  Cardiovascular: Regular rate and rhythm, S1/S2, no murmurs  Respiratory: Clear to auscultation bilaterally, no wheezing, no crackles, no rhonchi  Abdomen: Soft, non tender, non distended, bowel sounds present  Extremities: pain in right foot and swelling, palpable pulses   Neuro: Grossly nonfocal  Data Reviewed: Basic Metabolic Panel:  Recent Labs Lab 02/06/15 0522 02/07/15 0527 02/08/15 0538 02/09/15 0419 02/10/15 0532 02/11/15 0515  NA 140 140 140 139 140 140  K 3.5 3.8 3.7 3.7 4.0 3.8  CL 110 111 107 107 107 105  CO2 $Re'23 24 25 25 26 27  'mVb$ GLUCOSE 91 95 87 89 90 91  BUN $Re'7 6 10 12 12 16  'OQV$ CREATININE 0.95 1.00 0.96 1.00 1.07* 1.06*  CALCIUM 8.1* 8.2* 8.7* 9.1 9.5 9.6  MG 1.6* 1.8 1.5* 1.7  --  1.4*   Liver Function Tests:  Recent Labs Lab 02/04/15 1708 02/04/15 2155 02/05/15 0245  02/09/15 0419  AST 34 31 22 35  ALT $Re'19 15 14 20  'VRi$ ALKPHOS 76 58 53 55  BILITOT 0.9 0.2* 0.6 0.6  PROT 7.6 6.3* 5.5* 6.5  ALBUMIN 4.0 3.1* 2.8* 3.1*    Recent Labs Lab 02/04/15 1708  LIPASE 29   No results for input(s): AMMONIA in the last 168 hours. CBC:  Recent Labs Lab 02/04/15 1708 02/04/15 2155  02/07/15 0527 02/08/15 0538 02/09/15 0419 02/10/15 0532 02/11/15 0515  WBC 26.4* 22.0*  < > 14.0* 14.0* 13.9* 11.5* 11.7*  NEUTROABS 21.4* 18.2*  --   --   --   --   --   --  HGB 13.0 10.7*  < > 10.8* 10.8* 10.9* 10.8* 10.8*  HCT 40.6 33.5*  < > 33.5* 33.8* 34.0* 34.2* 33.5*  MCV 89.2 89.8  < > 89.8 89.4 89.2 88.4 88.9  PLT 128* 165  < > 227 205 249 276 293  < > = values in this interval not displayed. Cardiac Enzymes: No results for input(s): CKTOTAL, CKMB, CKMBINDEX, TROPONINI in the last 168 hours. BNP: Invalid input(s): POCBNP CBG:  Recent Labs Lab 02/07/15 0717 02/08/15 0753 02/09/15 0807 02/10/15 0730 02/11/15 0733  GLUCAP 78 78 86 88 86    Blood culture (routine x 2)     Status: None   Collection Time: 02/04/15  5:23 PM  Result Value Ref Range Status   Specimen Description BLOOD LEFT ANTECUBITAL  Final   Culture   Final    NO GROWTH 5 DAYS Performed at [facility]    Report Status 02/09/2015 FINAL  Final  C difficile quick scan w PCR reflex     Status: Abnormal   Collection Time: 02/04/15  6:43 PM  Result Value Ref Range Status   C Diff antigen POSITIVE (A) NEGATIVE Final   C Diff toxin NEGATIVE NEGATIVE Final   C Diff interpretation   Final  Blood culture (routine x 2)     Status: None   Collection Time: 02/04/15  9:55 PM  Result Value Ref Range Status   Specimen Description BLOOD RIGHT ARM  Final   Culture   Final    NO GROWTH 5 DAYS Performed at [facility]    Report Status 02/09/2015 FINAL  Final  C difficile quick scan w PCR reflex     Status: None   Collection Time: 02/06/15  6:36 AM  Result Value Ref Range Status    C Diff antigen NEGATIVE NEGATIVE Final   C Diff toxin NEGATIVE NEGATIVE Final   C Diff interpretation Negative for toxigenic C. difficile  Final     Scheduled Meds: . atorvastatin  20 mg Oral Daily  . calcium-vitamin D  1 tablet Oral Q breakfast  . donepezil  10 mg Oral QHS  . doxycycline  100 mg Oral Q12H  . folic acid  1 mg Oral Daily  . levothyroxine  112 mcg Oral QAC breakfast  . memantine  28 mg Oral Daily  . mometasone-formoterol  2 puff Inhalation BID  . multivitamin with minerals  1 tablet Oral Daily  . Rivaroxaban  15 mg Oral BID WC  . saccharomyces boulardii  250 mg Oral BID  . sertraline  25 mg Oral Daily  . sodium chloride  3 mL Intravenous Q12H  . thiamine  100 mg Oral Daily  . [START ON 03/03/2015] Vitamin D (Ergocalciferol)  50,000 Units Oral Q30 days

## 2015-02-11 NOTE — Progress Notes (Signed)
Physical Therapy Treatment Patient Details Name: Sarah Johnson MRN: [id number] DOB: 1932/09/25 Today's Date: 02/22/2015    History of Present Illness 81 y.o. female with history of HTN, COPD, HLD, C Diff, presented with fevers up to 100.2 F and diarrhea, diagnosed with LLL pneumonia.  R DVT found 02/07/15. R ankle and foot xrays: "No acute osseous abnormality"    PT Comments    Pt assisted with ambulating in hallway and then back to recliner.  Daughter present and observed; she reports plan is for pt to d/c home with son to assist pt.  Follow Up Recommendations  Supervision/Assistance - 24 hour;Home health PT     Equipment Recommendations  Rolling walker with 5" wheels    Recommendations for Other Services       Precautions / Restrictions Precautions Precautions: Fall    Mobility  Bed Mobility               General bed mobility comments: pt up in recliner on arrival  Transfers Overall transfer level: Needs assistance Equipment used: Rolling walker (2 wheeled) Transfers: Sit to/from Stand Sit to Stand: Min guard         General transfer comment: verbal cues for safe technique including hand placement, more weight-bearing observed on right LE as pt had L foot slightly off floor   Ambulation/Gait Ambulation/Gait assistance: Min guard Ambulation Distance (Feet): 80 Feet Assistive device: Rolling walker (2 wheeled) Gait Pattern/deviations: Step-through pattern;Decreased stride length     General Gait Details: verbal cues for safe use of RW, occasional assist for steering RW, pt denies pain during gait   Stairs            Wheelchair Mobility    Modified Rankin (Stroke Patients Only)       Balance                                    Cognition Arousal/Alertness: Awake/alert Behavior During Therapy: WFL for tasks assessed/performed Overall Cognitive Status: Within Functional Limits for tasks assessed (appropriate during session)                      Exercises      General Comments        Pertinent Vitals/Pain Pain Assessment: Faces Faces Pain Scale: Hurts even more Pain Location: pain with touch to dorsal foot, no pain during ambulation Pain Descriptors / Indicators: Guarding;Sore;Tender Pain Intervention(s): Limited activity within patient's tolerance;Monitored during session;Repositioned    Home Living                      Prior Function            PT Goals (current goals can now be found in the care plan section) Progress towards PT goals: Progressing toward goals    Frequency  Min 3X/week    PT Plan Current plan remains appropriate    Co-evaluation             End of Session Equipment Utilized During Treatment: Gait belt Activity Tolerance: Patient tolerated treatment well Patient left: in chair;with call bell/phone within reach     Time: 4098-1191 PT Time Calculation (min) (ACUTE ONLY): 10 min  Charges:  $Gait Training: 8-22 mins                    G Codes:      Sarah Johnson,Sarah Johnson 2015/02/22,  5:02 PM Carmelia Bake, PT, DPT 02/11/2015 Pager: [phone]

## 2015-02-11 NOTE — Consult Note (Signed)
   [facility] Inpatient Consult   02/11/2015  Sarah Johnson 1933-01-16 [id number]   [facility] Care Management referral received for [facility] Care Management services. Spoke with patient's daughters, Sarah Johnson and Sarah Johnson. Explained [facility] Care Management services and consents obtained. Explained that [facility] Management will not interfere or replace services provided by home health. Both daughters endorse that their brother, Sarah Johnson will come from [facility] to stay with patient for 1 week post discharge. Sarah Johnson (daughter) states she fills her mother's weekly pill box and she would like to be called for post transition of care calls from [facility]. The best contact number for Sarah Johnson is [phone]. Sarah Johnson daughter and Sarah Johnson is 2nd contact, per their request at [phone]. Sarah Johnson (son) from [facility] best contact number is [phone]. Family is main contact as patient has some confusion. Discussed that [place] will be requested to be assigned for COPD disease and symptom management. Left [facility] Care Management packet with contact information at bedside. Made inpatient RNCM aware.   Marthenia Rolling, MSN-Ed, RN,BSN [facility] Liaison [phone]

## 2015-02-11 NOTE — Consult Note (Signed)
WOC wound consult note Reason for Consult: Rash on back Wound type:Dermatologic eruption (perhaps drug related?) Pressure Ulcer POA: No Measurement:Diffuse presentation over back Wound bed: Scattered small pustules, numerous papules Drainage (amount, consistency, odor) None Periwound:Area so intact skin separate the lesions. Dressing procedure/placement/frequency:None.  Patient is using DermaTherapy linens that are antimicrobial and a silk-like textile often used for dermatologic conditions such as excema and psoriasis.  While the patient does not have these, perhaps it will assist in the care and resolution until patient can see a dermatologist post discharge. Other that this intervention, the condition is beyond the scope of [facility] nursing.  [place] nursing team will not follow, but will remain available to this patient, the nursing and medical teams.  Please re-consult if needed. Thanks, Maudie Flakes, MSN, RN, [place], [place], [place] [phone])

## 2015-02-12 ENCOUNTER — Encounter: Payer: Self-pay | Admitting: *Deleted

## 2015-02-12 ENCOUNTER — Inpatient Hospital Stay (HOSPITAL_COMMUNITY): Payer: Medicare Other

## 2015-02-12 DIAGNOSIS — E876 Hypokalemia: Secondary | ICD-10-CM

## 2015-02-12 DIAGNOSIS — D72829 Elevated white blood cell count, unspecified: Secondary | ICD-10-CM

## 2015-02-12 DIAGNOSIS — I82401 Acute embolism and thrombosis of unspecified deep veins of right lower extremity: Secondary | ICD-10-CM

## 2015-02-12 DIAGNOSIS — Z8719 Personal history of other diseases of the digestive system: Secondary | ICD-10-CM

## 2015-02-12 DIAGNOSIS — A047 Enterocolitis due to Clostridium difficile: Secondary | ICD-10-CM

## 2015-02-12 DIAGNOSIS — J189 Pneumonia, unspecified organism: Secondary | ICD-10-CM

## 2015-02-12 DIAGNOSIS — A419 Sepsis, unspecified organism: Principal | ICD-10-CM

## 2015-02-12 DIAGNOSIS — R627 Adult failure to thrive: Secondary | ICD-10-CM

## 2015-02-12 LAB — GLUCOSE, CAPILLARY: Glucose-Capillary: 70 mg/dL (ref 65–99)

## 2015-02-12 MED ORDER — MAGNESIUM SULFATE 2 GM/50ML IV SOLN
2.0000 g | Freq: Once | INTRAVENOUS | Status: AC
  Administered 2015-02-12: 2 g via INTRAVENOUS
  Filled 2015-02-12: qty 50

## 2015-02-12 MED ORDER — RIVAROXABAN 15 MG PO TABS
15.0000 mg | ORAL_TABLET | Freq: Two times a day (BID) | ORAL | Status: DC
Start: 2015-02-12 — End: 2017-04-20

## 2015-02-12 MED ORDER — RIVAROXABAN 20 MG PO TABS: 20.0000 mg | ORAL_TABLET | Freq: Every day | ORAL | Status: DC

## 2015-02-12 MED ORDER — THIAMINE HCL 100 MG PO TABS: 100.0000 mg | ORAL_TABLET | Freq: Every day | ORAL | Status: AC

## 2015-02-12 MED ORDER — METOPROLOL TARTRATE 25 MG PO TABS
12.5000 mg | ORAL_TABLET | Freq: Two times a day (BID) | ORAL | Status: DC
Start: 2015-02-12 — End: 2017-09-02

## 2015-02-12 MED ORDER — VANCOMYCIN HCL 125 MG PO CAPS: ORAL_CAPSULE | ORAL | Status: DC

## 2015-02-12 MED ORDER — IBUPROFEN 200 MG PO TABS: 400.0000 mg | ORAL_TABLET | Freq: Every day | ORAL | Status: DC | PRN

## 2015-02-12 MED ORDER — RIVAROXABAN 20 MG PO TABS
20.0000 mg | ORAL_TABLET | Freq: Every day | ORAL | Status: DC
Start: 2015-03-03 — End: 2015-11-28

## 2015-02-12 NOTE — Progress Notes (Signed)
ANTICOAGULATION CONSULT NOTE - Initial Consult  Pharmacy Consult for Xarelto Indication: VTE treatment  Allergies  Allergen Reactions  . Codeine Other (See Comments)    Pt. States it has been too long to remember the reaction   . Penicillins Other (See Comments)    Pt. Does not recall the reaction.  Has patient had a PCN reaction causing immediate rash, facial/tongue/throat swelling, SOB or lightheadedness with hypotension:  Has patient had a PCN reaction causing severe rash involving mucus membranes or skin necrosis: Has patient had a PCN reaction that required hospitalization  Has patient had a PCN reaction occurring within the last 10 years:  If all of the above answers are "NO", then may proceed with Cephalosporin use.     Patient Measurements: Height: 5\' 2"  (157.5 cm) Weight: 135 lb 15.3 oz (61.67 kg) IBW/kg (Calculated) : 50.1   Vital Signs: Temp: 98 F (36.7 C) (10/19 0514) Temp Source: Oral (10/19 0514) BP: 125/66 mmHg (10/19 0514) Pulse Rate: 66 (10/19 0514)  Labs:  Recent Labs  02/10/15 0532 02/11/15 0515  HGB 10.8* 10.8*  HCT 34.2* 33.5*  PLT 276 293  CREATININE 1.07* 1.06*    Estimated Creatinine Clearance: 36.6 mL/min (by C-G formula based on Cr of 1.06).   Medical History: Past Medical History  Diagnosis Date  . Emphysema     followed by Dr. Joya Gaskins  . S/P thoracic aortic aneurysm repair 1/10    also with bioprosthetic aortic valve prelacement [facility]). in 10/10 she has a descending thoraic aorta anuerysm and abdominal reapir [facility]). Vascular urgeon is Dr. Lunette Stands.   . Pulmonary embolism ([place]) 2010    She is no onger taking coumadin  . Hypothyroidism   . Blind right eye   . Hyperlipidemia   . S/P aortic valve replacement with bioprosthetic valve     ehco (8/11) with EF 55-60%, mild-mod MR, mild-mod TR, poorly visualized bioprosthetic aortic valve but no significant regurgitation and gradient not significanly elevated.    . CKD  (chronic kidney disease)   . HTN (hypertension)   . History of Doppler echocardiogram     carotid dopplers. (8/11) no significant stenosis  . COPD (chronic obstructive pulmonary disease) ([place])   . SVT (supraventricular tachycardia) ([facility])     HOLTER MONITOR AFTER SURGERY   . GERD (gastroesophageal reflux disease)   . Arthritis   . Cataract     RT  . Hx of echocardiogram 2015    Echo (08/2013):  EF 60-65%, no RWMA, Gr 1 DD, AVR ok (mean 5 mmHg), mild MR, PASP 22 mmHg    Medications:  Scheduled:  . atorvastatin  20 mg Oral Daily  . calcium-vitamin D  1 tablet Oral Q breakfast  . donepezil  10 mg Oral QHS  . folic acid  1 mg Oral Daily  . levothyroxine  112 mcg Oral QAC breakfast  . memantine  28 mg Oral Daily  . mometasone-formoterol  2 puff Inhalation BID  . multivitamin with minerals  1 tablet Oral Daily  . Rivaroxaban  15 mg Oral BID WC  . saccharomyces boulardii  250 mg Oral BID  . sertraline  25 mg Oral Daily  . sodium chloride  3 mL Intravenous Q12H  . thiamine  100 mg Oral Daily  . [START ON 03/03/2015] Vitamin D (Ergocalciferol)  50,000 Units Oral Q30 days    Assessment: Patient's an 81 y.o F currently on xarelto for new R LE DVT.  CBC and scr remain stable.  No bleeding documented.  Goal of Therapy:  Anti-Xa level 0.6-1 units/ml 4hrs after LMWH dose given Monitor platelets by anticoagulation protocol   Plan:  - continue xarelto 15mg  PO BID for 21 days, then 20mg  daily - monitor for s/s bleeding  Dia Sitter, PharmD, BCPS 02/12/2015 9:47 AM

## 2015-02-12 NOTE — Discharge Summary (Signed)
Discharge Summary  Sarah Johnson SRP:[id number] DOB: October 25, 1932  PCP: Jani Gravel, MD  Admit date: 02/04/2015 Discharge date: 02/12/2015  Time spent: >70mins, TALKED TO [place] PHONE  Recommendations for Outpatient Follow-up:  1. F/u with PMD within a week, repeat bmp at follow up, pmd to refer patient to orthopedic surgery for left knee arthritis. 2. New meds, newly started on xarelto for DVT. Also discharged on oral vancocin taper for recurrent C diff, bp meds adjusted as well.  Discharge Diagnoses:  Active Hospital Problems   Diagnosis Date Noted  . Sepsis ([place])   . Leukocytosis 02/11/2015  . Hypokalemia 02/11/2015  . Hypomagnesemia 02/11/2015  . Deep vein thrombosis (DVT) of right lower extremity ([place]) 02/08/2015  . CKD (chronic kidney disease), stage II 02/05/2015  . Anemia of chronic renal failure, stage 2 (mild) 02/05/2015  . Thrombocytopenia ([place]) 02/05/2015  . Left lower lobe pneumonia 02/05/2015  . History of Clostridium difficile colitis 05/09/2014    Resolved Hospital Problems   Diagnosis Date Noted Date Resolved  . Severe sepsis ([place])  02/11/2015  . C. difficile colitis  02/11/2015  . Enteritis due to Clostridium difficile  02/11/2015    Discharge Condition: stable  Diet recommendation: heart healthy  Filed Weights   02/08/15 0458 02/10/15 0646 02/11/15 0500  Weight: 144 lb 13.5 oz (65.7 kg) 136 lb 11 oz (62 kg) 135 lb 15.3 oz (61.67 kg)    History of present illness (per admitting physician Dr. Humphrey Rolls):  Sarah Johnson is a 81 y.o. female with history of HTN COPD HLD C Diff presents with fevers and diarrhea. Patients daughter stated that she had not felt well today. She had been having multiple bouts of diarrhea today. It started last night. Her daughter called her around 70 and she told her she was weak. She checked her temperature which was 100.5F and rising. She has had similar presentation in the past with C diff colitis so they were  concerned for another exacerbation. She has not been drinking much either. Patient does also have some memory problems so the daughter is not sure if she really recalls. She had no belly pain but noted that her stomach was more distended than normal. No nausea or vomiting. She states that the diarrhea was stringy and foul smelling. Patient has a prior history of splenectomy.    Hospital Course:  Principal Problem:   Sepsis ([place]) Active Problems:   History of Clostridium difficile colitis   CKD (chronic kidney disease), stage II   Anemia of chronic renal failure, stage 2 (mild)   Thrombocytopenia (H[facility])   Left lower lobe pneumonia   Deep vein thrombosis (DVT) of right lower extremity (HCC)   Leukocytosis   Hypokalemia   Hypomagnesemia  Sepsis secondary to lower lobe pneumonia / Leukocytosis - Sepsis criteria met on admission. Source of infection likely left lower lobe pneumonia seen on admission CXR. - Pt treated appropriately with doxycyline and finished treatment in the hospital - X ray repeated 10/17 showed chronic basilar scaring on left , no acute findings - WBC count still slightly up but stable at 11.7 -due to sepsis , her home bp meds held entire hospitalisation, at discharge low dose lopressor restarted, norvasc stopped.  Active Problems:  Diarrhea present on admission  - Pt has had C.diff back in 06/2014 - On this admission, she had antigen positive nut no toxin C.diff - She was treated empirically with PO vanco for C.diff considering ongoing diarrhea and he admission for  sepsis - Diarrhea subsided to 2 BM's in past 24 hours, stool become formed, will discharge home with oral vancocin taper for recurrent c diff. Advised daughter to close follow up with pmd to repeat c diff if diarrhea again.   Headache 10/17 / Confusion - Family thought confusion due to stroke however pt probably has underlying dementia. Se has had MMSE in neurology office in 12/2014 with MMSE of 22 which  fall into category of mild dementia. - CT head with remote lacunar stroke on the left side but MRI with no acute strokes. I have explained this to pt's daughter extensively - We will continue xarelto but not aspirin so to minimize risk of bleed    RLE pain 02/06/2015 / right foot pain / left leg pain  - Doppler positive for DVT 10/13 - Pt initially started on Lovenox but lives at home alone so not able to give herself shots  - Transitioned to [place] follow up to monitor renal function, advise patient/s daughter about fall precaution,daughter states patient will have 24/7 care at home for now.  Right foot pain, daughter insisted on getting mri, mri no acute findings, does has mild to moderate osteoarthritis  Left knee pain, knee x ray with osteoarthritis, advised daughter continue physical therapy, prn pain meds, consider orthopedics referral for knee injection if pain persist. Daughter expressed understanding.   Chronic renal failure stage II - III - Baseline Cr 1.4 - 1.5 (7 months ago) - Cr is currently within baseline range    Thrombocytopenia - Spontaneously resolved    Hypokalemia - Supplemented and remains WNL    Hypomagnesemia - Supplemented    Anemia of CKD stage II - III - Drop in Hg since admission likely dilutional  - Hemoglobin stable at 10.8 - No current indications for transfusion     Discharge Exam: BP 125/66 mmHg  Pulse 66  Temp(Src) 98 F (36.7 C) (Oral)  Resp 17  Ht $R'5\' 2"'oS$  (1.575 m)  Wt 135 lb 15.3 oz (61.67 kg)  BMI 24.86 kg/m2  SpO2 92%  General: alert, oriented x3, mild short term memory impairment Cardiovascular: RRR Respiratory: clear, no wheezing, no rales Ab: soft, nontender Extremity: right foot edema subsided,  Discharge Instructions You were cared for by a hospitalist during your hospital stay. If you have any questions about your discharge medications or the care you received while you were in the hospital after you are  discharged, you can call the unit and asked to speak with the hospitalist on call if the hospitalist that took care of you is not available. Once you are discharged, your primary care physician will handle any further medical issues. Please note that NO REFILLS for any discharge medications will be authorized once you are discharged, as it is imperative that you return to your primary care physician (or establish a relationship with a primary care physician if you do not have one) for your aftercare needs so that they can reassess your need for medications and monitor your lab values.  Discharge Instructions    AMB Referral to [facility]    Complete by:  As directed   Please assign to [facility] for COPD disease management and transition of care. Please assign to [facility] LCSW for potential needs post discharge regarding possible placement if needed and/or community resources. Consents signed. Please contact Constantina Laseter (dtr) for transition of care calls [phone]; 2nd contact [place] [facility](dtr/HCPOA) [phone]; Alta Goding (son) [phone]. Possible soon discharge with  home health. Lives alone. Son is supposed to stay with patient x 1 week after discharge. Please call with questions. Marthenia Rolling, [place], [facility] Liaison-[phone]  Reason for consult:  Please assign to [facility] and [facility] LCSW  Diagnoses of:  COPD/ Pneumonia  Expected date of contact:  1-3 days (reserved for hospital discharges)     Face-to-face encounter (required for Medicare/Medicaid patients)    Complete by:  As directed   I Velicia Dejager certify that this patient is under my care and that I, or a nurse practitioner or physician's assistant working with me, had a face-to-face encounter that meets the physician face-to-face encounter requirements with this patient on 02/12/2015. The encounter with the patient was in whole, or in part for the following medical condition(s) which is the primary reason  for home health care (List medical condition): FTT  The encounter with the patient was in whole, or in part, for the following medical condition, which is the primary reason for home health care:  FTT  I certify that, based on my findings, the following services are medically necessary home health services:   Nursing Physical therapy    Reason for Medically Necessary Home Health Services:  Skilled Nursing- Change/Decline in Patient Status  My clinical findings support the need for the above services:  Cognitive impairments, dementia, or mental confusion  that make it unsafe to leave home  Further, I certify that my clinical findings support that this patient is homebound due to:  Unsafe ambulation due to balance issues     Home Health    Complete by:  As directed   To provide the following care/treatments:   PT OT RN Home Health Aide              Medication List    STOP taking these medications        amLODipine 5 MG tablet  Commonly known as:  NORVASC      TAKE these medications        aspirin EC 81 MG tablet  Take 1 tablet (81 mg total) by mouth daily.     atorvastatin 20 MG tablet  Commonly known as:  LIPITOR  TAKE 1 TABLET BY MOUTH EVERY DAY     Calcium-Vitamin D 600-200 MG-UNIT Caps  Take 1 tablet by mouth daily.     donepezil 10 MG tablet  Commonly known as:  ARICEPT  TAKE 1 TABLET (10 MG TOTAL) BY MOUTH AT BEDTIME.     DULERA 100-5 MCG/ACT Aero  Generic drug:  mometasone-formoterol  INHALE 2 PUFFS INTO THE LUNGS 2 TIMES DAILY. RINSE MOUTH AFTER EACH USE     ergocalciferol 50000 UNITS capsule  Commonly known as:  VITAMIN D2  Take 50,000 Units by mouth every 30 (thirty) days.     ibandronate 150 MG tablet  Commonly known as:  BONIVA  Take 150 mg by mouth every 30 (thirty) days. Take in the morning with a full glass of water, on an empty stomach, and do not take anything else by mouth or lie down for the next 30 min.     ibuprofen 200 MG tablet  Commonly  known as:  ADVIL,MOTRIN  Take 2 tablets (400 mg total) by mouth daily as needed for moderate pain.     levothyroxine 112 MCG tablet  Commonly known as:  SYNTHROID, LEVOTHROID  Take 112 mcg by mouth daily.     metoprolol tartrate 25 MG tablet  Commonly known as:  LOPRESSOR  Take 0.5 tablets (12.5 mg total) by mouth 2 (two) times daily.     multivitamin with minerals Tabs tablet  Take 1 tablet by mouth daily.     NAMENDA XR 28 MG Cp24 24 hr capsule  Generic drug:  memantine  Take 1 tablet by mouth daily.     PROAIR HFA 108 (90 BASE) MCG/ACT inhaler  Generic drug:  albuterol  Inhale 2 puffs into the lungs every 6 (six) hours as needed.     Rivaroxaban 15 MG Tabs tablet  Commonly known as:  XARELTO  Take 1 tablet (15 mg total) by mouth 2 (two) times daily with a meal.     rivaroxaban 20 MG Tabs tablet  Commonly known as:  XARELTO  Take 1 tablet (20 mg total) by mouth daily with supper.  Start taking on:  03/03/2015     saccharomyces boulardii 250 MG capsule  Commonly known as:  FLORASTOR  Take 1 capsule (250 mg total) by mouth 2 (two) times daily.     sertraline 50 MG tablet  Commonly known as:  ZOLOFT  Take 25 mg by mouth daily.     thiamine 100 MG tablet  Take 1 tablet (100 mg total) by mouth daily.     vancomycin 125 MG capsule  Commonly known as:  VANCOCIN HCL  TAKE $Rem'125MG'IFAk$  PO BID FOR 7 DAYS TILL 10/26, THEN $RemoveBef'125MG'MmQiXegTTt$  PO QD FOR 7 DAYS TILL11/2, THEN 125 EVERY 2DAYS FOR 4WEEKS.       Allergies  Allergen Reactions  . Codeine Other (See Comments)    Pt. States it has been too long to remember the reaction   . Penicillins Other (See Comments)    Pt. Does not recall the reaction.  Has patient had a PCN reaction causing immediate rash, facial/tongue/throat swelling, SOB or lightheadedness with hypotension:  Has patient had a PCN reaction causing severe rash involving mucus membranes or skin necrosis: Has patient had a PCN reaction that required hospitalization  Has patient  had a PCN reaction occurring within the last 10 years:  If all of the above answers are "NO", then may proceed with Cephalosporin use.        Follow-up Information    Follow up with Jani Gravel, MD In 1 week.   Specialty:  Internal Medicine   Why:  hospital discharge follow up, repeat bmp at follow up, pmd to refer to orthopedics for left knee pain   Contact information:   [address] [place] [address] [phone]        The results of significant diagnostics from this hospitalization (including imaging, microbiology, ancillary and laboratory) are listed below for reference.    Significant Diagnostic Studies: Dg Chest 2 View  02/04/2015  CLINICAL DATA:  Fever, COPD EXAM: CHEST  2 VIEW COMPARISON:  06/28/2014 FINDINGS: There is mild bilateral interstitial thickening. Hazy left lower lobe airspace disease which may reflect atelectasis versus pneumonia. There is no pleural effusion or pneumothorax. There is mild stable cardiomegaly. The osseous structures are unremarkable. IMPRESSION: Hazy left lower lobe airspace disease which may reflect atelectasis versus pneumonia. Electronically Signed   By: Kathreen Devoid   On: 02/04/2015 18:11   Ct Head Wo Contrast  02/10/2015  CLINICAL DATA:  Headaches, CT can confusion EXAM: CT HEAD WITHOUT CONTRAST TECHNIQUE: Contiguous axial images were obtained from the base of the skull through the vertex without intravenous contrast. COMPARISON:  12/19/2013 FINDINGS: There is no evidence of mass effect, midline shift, or extra-axial  fluid collections. There is no evidence of a space-occupying lesion or intracranial hemorrhage. There is no evidence of a cortical-based area of acute infarction. There is a subacute or chronic left thalamic lacunar infarct. There is generalized cerebral atrophy. There is periventricular white matter low attenuation likely secondary to microangiopathy. The ventricles and sulci are appropriate for the patient's  age. The basal cisterns are patent. Visualized portions of the orbits are unremarkable. The visualized portions of the paranasal sinuses and mastoid air cells are unremarkable. The osseous structures are unremarkable. IMPRESSION: 1. Subacute or chronic left thalamic lacunar infarct. 2.  Otherwise, no acute intracranial pathology. Electronically Signed   By: Kathreen Devoid   On: 02/10/2015 12:30   Mr Brain Wo Contrast  02/10/2015  CLINICAL DATA:  History of dementia.  Headaches and confusion. EXAM: MRI HEAD WITHOUT CONTRAST TECHNIQUE: Multiplanar, multiecho pulse sequences of the brain and surrounding structures were obtained without intravenous contrast. COMPARISON:  CT head earlier today. FINDINGS: No evidence for acute infarction, hemorrhage, mass lesion, hydrocephalus, or extra-axial fluid. Advanced atrophy. Mild to moderate small vessel disease. Remote LEFT thalamic lacunar infarct accounts for the CT findings. Flow voids are maintained throughout the carotid, basilar, and vertebral arteries. There are no areas of chronic hemorrhage. Pituitary, pineal, and cerebellar tonsils unremarkable. No upper cervical lesions. BILATERAL cataract extraction. Extracranial soft tissues unremarkable. No acute sinus or mastoid disease. Trace BILATERAL mastoid effusions. IMPRESSION: Atrophy and small vessel disease.  No acute intracranial findings. No findings in the extracranial soft tissues to suggest a cause for headaches. Electronically Signed   By: Staci Righter M.D.   On: 02/10/2015 19:08   Dg Knee Left Port  02/11/2015  CLINICAL DATA:  Patient with generalized knee pain. No traumatic injury. Initial encounter. EXAM: PORTABLE LEFT KNEE - 1-2 VIEW COMPARISON:  Left knee MRI 11/27/2007. FINDINGS: Normal anatomic alignment. No evidence for acute fracture or dislocation. Marked medial compartment degenerative changes with joint space narrowing and osteophytosis. Mild lateral and patellofemoral compartment degenerative  changes. Chondrocalcinosis. Small joint effusion. IMPRESSION: Tricompartmental osteoarthritis most pronounced within the medial compartment. Chondrocalcinosis. Small joint effusion. Electronically Signed   By: Lovey Newcomer M.D.   On: 02/11/2015 15:15   Dg Ankle Right Port  02/11/2015  CLINICAL DATA:  No known injury. Generalized ankle pain. Initial encounter. EXAM: PORTABLE RIGHT ANKLE - 2 VIEW COMPARISON:  None. FINDINGS: Normal anatomic alignment. No evidence for acute fracture or dislocation. Plantar calcaneal spurring. Regional soft tissues unremarkable. IMPRESSION: No acute osseous abnormality. Electronically Signed   By: Lovey Newcomer M.D.   On: 02/11/2015 15:05   Dg Abd Acute W/chest  02/10/2015  CLINICAL DATA:  Hypertension, COPD, emphysema. Recent left lower lobe pneumonia. Weakness, abdominal pain. EXAM: DG ABDOMEN ACUTE W/ 1V CHEST COMPARISON:  02/04/2015 FINDINGS: Chronic densities in the left lung base, stable dating back to 03/27/2014 compatible with scarring. No acute airspace opacities or effusions. Heart is mildly enlarged. Prior CABG. Nonobstructive bowel gas pattern. No free air. No organomegaly or suspicious calcification. IMPRESSION: No acute findings in the abdomen.  No free air or obstruction. Chronic left basilar scarring.  Borderline cardiomegaly. Electronically Signed   By: Rolm Baptise M.D.   On: 02/10/2015 09:59   Dg Foot Complete Right  02/11/2015  CLINICAL DATA:  Generalized RIGHT foot and ankle pain, no reported injury EXAM: RIGHT FOOT COMPLETE - 3+ VIEW PORTABLE COMPARISON:  Portable exam at 1431 hours without priors for comparison FINDINGS: Osseous demineralization. Mild degenerative changes with question chondrocalcinosis at  first MTP joint. Remaining joint spaces preserved. Moderate-sized plantar calcaneal spur. No acute fracture, dislocation or bone destruction. IMPRESSION: No acute osseous abnormalities. Calcaneal spurring. Degenerative changes with question CPPD at  first MTP joint. Electronically Signed   By: Lavonia Dana M.D.   On: 02/11/2015 15:22    Microbiology: Recent Results (from the past 240 hour(s))  Blood culture (routine x 2)     Status: None   Collection Time: 02/04/15  5:23 PM  Result Value Ref Range Status   Specimen Description BLOOD LEFT ANTECUBITAL  Final   Special Requests BOTTLES DRAWN AEROBIC AND ANAEROBIC 5CC  Final   Culture   Final    NO GROWTH 5 DAYS Performed at [facility]    Report Status 02/09/2015 FINAL  Final  C difficile quick scan w PCR reflex     Status: Abnormal   Collection Time: 02/04/15  6:43 PM  Result Value Ref Range Status   C Diff antigen POSITIVE (A) NEGATIVE Final   C Diff toxin NEGATIVE NEGATIVE Final   C Diff interpretation   Final    C. difficile present, but toxin not detected. This indicates colonization. In most cases, this does not require treatment. If patient has signs and symptoms consistent with colitis, consider treatment. Requires ENTERIC precautions.  Blood culture (routine x 2)     Status: None   Collection Time: 02/04/15  9:55 PM  Result Value Ref Range Status   Specimen Description BLOOD RIGHT ARM  Final   Special Requests BOTTLES DRAWN AEROBIC AND ANAEROBIC 10 CC  Final   Culture   Final    NO GROWTH 5 DAYS Performed at [facility]    Report Status 02/09/2015 FINAL  Final  C difficile quick scan w PCR reflex     Status: None   Collection Time: 02/06/15  6:36 AM  Result Value Ref Range Status   C Diff antigen NEGATIVE NEGATIVE Final   C Diff toxin NEGATIVE NEGATIVE Final   C Diff interpretation Negative for toxigenic C. difficile  Final     Labs: Basic Metabolic Panel:  Recent Labs Lab 02/06/15 0522 02/07/15 0527 02/08/15 0538 02/09/15 0419 02/10/15 0532 02/11/15 0515  NA 140 140 140 139 140 140  K 3.5 3.8 3.7 3.7 4.0 3.8  CL 110 111 107 107 107 105  CO2 $Re'23 24 25 25 26 27  'cRz$ GLUCOSE 91 95 87 89 90 91  BUN $Re'7 6 10 12 12 16  'Mak$ CREATININE 0.95 1.00 0.96  1.00 1.07* 1.06*  CALCIUM 8.1* 8.2* 8.7* 9.1 9.5 9.6  MG 1.6* 1.8 1.5* 1.7  --  1.4*   Liver Function Tests:  Recent Labs Lab 02/09/15 0419  AST 35  ALT 20  ALKPHOS 55  BILITOT 0.6  PROT 6.5  ALBUMIN 3.1*   No results for input(s): LIPASE, AMYLASE in the last 168 hours. No results for input(s): AMMONIA in the last 168 hours. CBC:  Recent Labs Lab 02/07/15 0527 02/08/15 0538 02/09/15 0419 02/10/15 0532 02/11/15 0515  WBC 14.0* 14.0* 13.9* 11.5* 11.7*  HGB 10.8* 10.8* 10.9* 10.8* 10.8*  HCT 33.5* 33.8* 34.0* 34.2* 33.5*  MCV 89.8 89.4 89.2 88.4 88.9  PLT 227 205 249 276 293   Cardiac Enzymes: No results for input(s): CKTOTAL, CKMB, CKMBINDEX, TROPONINI in the last 168 hours. BNP: BNP (last 3 results) No results for input(s): BNP in the last 8760 hours.  ProBNP (last 3 results) No results for input(s): PROBNP in the last 8760  hours.  CBG:  Recent Labs Lab 02/08/15 0753 02/09/15 0807 02/10/15 0730 02/11/15 0733 02/12/15 0748  GLUCAP 78 86 88 86 70       Signed:  Blakelyn Dinges MD, PhD  [facility] 02/12/2015, 1:32 PM

## 2015-02-12 NOTE — Progress Notes (Signed)
Physical Therapy Treatment Patient Details Name: Sarah Johnson MRN: [id number] DOB: Sep 15, 1932 Today's Date: 03-01-15    History of Present Illness 81 y.o. female with history of HTN, COPD, HLD, C Diff, presented with fevers up to 100.2 F and diarrhea, diagnosed with LLL pneumonia.  R DVT found 02/07/15. R ankle and foot xrays: "No acute osseous abnormality"    PT Comments    Pt tolerated increased ambulation today and also reports no pain during mobility however did reports some L knee pain upon return to sitting on bed.  Pt returned to supine and placed pillow under LEs, which pt reports comfortable.   Follow Up Recommendations  Supervision/Assistance - 24 hour;Home health PT     Equipment Recommendations  Rolling walker with 5" wheels    Recommendations for Other Services       Precautions / Restrictions Precautions Precautions: Fall    Mobility  Bed Mobility Overal bed mobility: Modified Independent             General bed mobility comments: increased time  Transfers Overall transfer level: Needs assistance Equipment used: Rolling walker (2 wheeled) Transfers: Sit to/from Stand Sit to Stand: Min guard         General transfer comment: verbal cues for hand placement  Ambulation/Gait Ambulation/Gait assistance: Min guard Ambulation Distance (Feet): 200 Feet Assistive device: Rolling walker (2 wheeled) Gait Pattern/deviations: Step-through pattern;Decreased stride length     General Gait Details: verbal cues for safe use of RW and RW positioning, pt denies pain, SpO2 93% room air upon return to room, pt reporting mild SOB, fatigue   Stairs            Wheelchair Mobility    Modified Rankin (Stroke Patients Only)       Balance                                    Cognition Arousal/Alertness: Awake/alert Behavior During Therapy: WFL for tasks assessed/performed Overall Cognitive Status: Within Functional Limits for tasks  assessed (appropriate during session)                      Exercises      General Comments        Pertinent Vitals/Pain Pain Assessment: No/denies pain    Home Living                      Prior Function            PT Goals (current goals can now be found in the care plan section) Progress towards PT goals: Progressing toward goals    Frequency  Min 3X/week    PT Plan Current plan remains appropriate    Co-evaluation             End of Session Equipment Utilized During Treatment: Gait belt Activity Tolerance: Patient tolerated treatment well Patient left: with call bell/phone within reach;in bed;with bed alarm set     Time: 2330-0762 PT Time Calculation (min) (ACUTE ONLY): 13 min  Charges:  $Gait Training: 8-22 mins                    G Codes:      Sarah Johnson,Sarah Johnson 03/01/2015, 12:39 PM Carmelia Bake, PT, DPT 03/01/2015 Pager: [phone]

## 2015-02-12 NOTE — Patient Outreach (Signed)
[facility]) Care Management  02/12/2015  KARESHA TRZCINSKI 04-30-1932 [id number]   Referral from Jari Pigg, RN to assign [employer] and SW, assigned Raina Mina, RN and [place], [place].  Thanks, Ronnell Freshwater. [place], [place] Assistant Phone: [phone] Fax: [phone]

## 2015-02-13 DIAGNOSIS — A047 Enterocolitis due to Clostridium difficile: Secondary | ICD-10-CM | POA: Diagnosis not present

## 2015-02-13 DIAGNOSIS — H5411 Blindness, right eye, low vision left eye: Secondary | ICD-10-CM | POA: Diagnosis not present

## 2015-02-13 DIAGNOSIS — A419 Sepsis, unspecified organism: Secondary | ICD-10-CM | POA: Diagnosis not present

## 2015-02-13 DIAGNOSIS — J189 Pneumonia, unspecified organism: Secondary | ICD-10-CM | POA: Diagnosis not present

## 2015-02-13 DIAGNOSIS — J440 Chronic obstructive pulmonary disease with acute lower respiratory infection: Secondary | ICD-10-CM | POA: Diagnosis not present

## 2015-02-13 DIAGNOSIS — E785 Hyperlipidemia, unspecified: Secondary | ICD-10-CM | POA: Diagnosis not present

## 2015-02-13 DIAGNOSIS — N179 Acute kidney failure, unspecified: Secondary | ICD-10-CM | POA: Diagnosis not present

## 2015-02-13 DIAGNOSIS — Z7901 Long term (current) use of anticoagulants: Secondary | ICD-10-CM | POA: Diagnosis not present

## 2015-02-13 DIAGNOSIS — N183 Chronic kidney disease, stage 3 (moderate): Secondary | ICD-10-CM | POA: Diagnosis not present

## 2015-02-13 DIAGNOSIS — I129 Hypertensive chronic kidney disease with stage 1 through stage 4 chronic kidney disease, or unspecified chronic kidney disease: Secondary | ICD-10-CM | POA: Diagnosis not present

## 2015-02-13 DIAGNOSIS — I82401 Acute embolism and thrombosis of unspecified deep veins of right lower extremity: Secondary | ICD-10-CM | POA: Diagnosis not present

## 2015-02-14 DIAGNOSIS — K625 Hemorrhage of anus and rectum: Secondary | ICD-10-CM | POA: Diagnosis not present

## 2015-02-14 DIAGNOSIS — R197 Diarrhea, unspecified: Secondary | ICD-10-CM | POA: Diagnosis not present

## 2015-02-14 DIAGNOSIS — D649 Anemia, unspecified: Secondary | ICD-10-CM | POA: Diagnosis not present

## 2015-02-14 DIAGNOSIS — K921 Melena: Secondary | ICD-10-CM | POA: Diagnosis not present

## 2015-02-15 DIAGNOSIS — J440 Chronic obstructive pulmonary disease with acute lower respiratory infection: Secondary | ICD-10-CM | POA: Diagnosis not present

## 2015-02-15 DIAGNOSIS — A047 Enterocolitis due to Clostridium difficile: Secondary | ICD-10-CM | POA: Diagnosis not present

## 2015-02-15 DIAGNOSIS — I129 Hypertensive chronic kidney disease with stage 1 through stage 4 chronic kidney disease, or unspecified chronic kidney disease: Secondary | ICD-10-CM | POA: Diagnosis not present

## 2015-02-15 DIAGNOSIS — J189 Pneumonia, unspecified organism: Secondary | ICD-10-CM | POA: Diagnosis not present

## 2015-02-15 DIAGNOSIS — A419 Sepsis, unspecified organism: Secondary | ICD-10-CM | POA: Diagnosis not present

## 2015-02-15 DIAGNOSIS — I82401 Acute embolism and thrombosis of unspecified deep veins of right lower extremity: Secondary | ICD-10-CM | POA: Diagnosis not present

## 2015-02-17 ENCOUNTER — Other Ambulatory Visit: Payer: Self-pay | Admitting: *Deleted

## 2015-02-17 DIAGNOSIS — J449 Chronic obstructive pulmonary disease, unspecified: Secondary | ICD-10-CM | POA: Diagnosis not present

## 2015-02-17 DIAGNOSIS — F0630 Mood disorder due to known physiological condition, unspecified: Secondary | ICD-10-CM | POA: Diagnosis not present

## 2015-02-17 DIAGNOSIS — M810 Age-related osteoporosis without current pathological fracture: Secondary | ICD-10-CM | POA: Diagnosis not present

## 2015-02-17 DIAGNOSIS — E039 Hypothyroidism, unspecified: Secondary | ICD-10-CM | POA: Diagnosis not present

## 2015-02-17 DIAGNOSIS — D649 Anemia, unspecified: Secondary | ICD-10-CM | POA: Diagnosis not present

## 2015-02-17 NOTE — Patient Outreach (Signed)
[place] [facility]) Care Management  02/17/2015  Sarah Johnson July 27, 1932 [id number]  RN spoke with pt's daughter [name]) and explained the purpose of today's call and inquired further concerning [facility] involvement. Daughter requested RN to contact her brother who is sitting with the pt for this week and can provider the discharge information requested today for the transition of care. RN will follow up with pt's son [name]) and completed the transition of care.  Raina Mina, RN Care Management Coordinator [place] [facility] Main Office [phone]

## 2015-02-17 NOTE — Patient Outreach (Signed)
[place] [facility]) Care Management  02/17/2015  TANDI HANKO 01/17/1933 [id number]   Follow up call to patient's son to discuss possible placement needs as referral states.  Voicemail message left for a return call.    Sheralyn Boatman [facility] Care Management [phone]

## 2015-02-18 ENCOUNTER — Other Ambulatory Visit: Payer: Self-pay

## 2015-02-18 DIAGNOSIS — D485 Neoplasm of uncertain behavior of skin: Secondary | ICD-10-CM | POA: Diagnosis not present

## 2015-02-18 DIAGNOSIS — C4441 Basal cell carcinoma of skin of scalp and neck: Secondary | ICD-10-CM | POA: Diagnosis not present

## 2015-02-19 ENCOUNTER — Other Ambulatory Visit: Payer: Self-pay | Admitting: *Deleted

## 2015-02-19 NOTE — Patient Outreach (Signed)
[place] [facility]) Care Management  02/19/2015  Sarah Johnson 10/03/1932 [id number]   Rn contacted daughter Sarah Johnson however unsuccessful with attempt to make arrangements for a home visit. RN left contact name and number and requested a call back. Will further extend offer for [facility] services at that time.   Sarah Mina, RN Care Management Coordinator [place] [facility] Main Office [phone]

## 2015-02-19 NOTE — Patient Outreach (Signed)
[place] [facility]) Care Management  02/19/2015  Sarah Johnson September 28, 1932 [id number]   Follow up call to patient's son to discuss possible placement needs as referral states. Voicemail message left for a return call.    Sheralyn Boatman [facility] Care Management [phone]

## 2015-02-19 NOTE — Patient Outreach (Signed)
[place] [facility]) Care Management  02/19/2015  JEANISE DURFEY 1932/09/01 [id number]  Transition of care  RN spoke with pt's son who is the current caregiver at this time for this week. Transition of care completed and son reports pt has visited his primary doctor recently when labs were drawn and pt had diarrhea at the time and was seen by her provider. No interventions at that time and son reports pt "getting better" everyday since her discharge.  Reports pt has be adherent with her nebulizer/inhalers and has use her emergency inhaler 3-4 times 3 days after being discharged home. No acute symptoms have occurred as pt able to recovery well after use. States pt refuses a lot of services due to her Dementia. Therefore OT was discontinued as pt would not continued the daily regimen (issues with short term memory) with [facility] however nursing remains involved. Son also mentioned pt is active however has a clot in her leg that at one time was painful however much improved and son indicates is it dissipating. No immediate needs at this time as RN extended home visits for ongoing education to pt and caregiver concerning her medical issues with COPD. Son has requested RN to contact pt's daughter Drue Dun for home visit or any arrangements for involvement due to he ould be leaving the at the end of this week however work hours are M-F 8:30-5PM. RN offered to scheduled a home visit this week while he remains in town however declined for tomorrow due to [place] visit and this would be to much for the pt to have two visits in the same day. RN will contact the daughter Drue Dun concerning ongoing involvement with [facility] and extend program and services with home visits to the pt. No other inquires or request at this time.  Raina Mina, RN Care Management Coordinator [place] [facility] Main Office [phone]

## 2015-02-20 DIAGNOSIS — A419 Sepsis, unspecified organism: Secondary | ICD-10-CM | POA: Diagnosis not present

## 2015-02-20 DIAGNOSIS — I82401 Acute embolism and thrombosis of unspecified deep veins of right lower extremity: Secondary | ICD-10-CM | POA: Diagnosis not present

## 2015-02-20 DIAGNOSIS — A047 Enterocolitis due to Clostridium difficile: Secondary | ICD-10-CM | POA: Diagnosis not present

## 2015-02-20 DIAGNOSIS — I129 Hypertensive chronic kidney disease with stage 1 through stage 4 chronic kidney disease, or unspecified chronic kidney disease: Secondary | ICD-10-CM | POA: Diagnosis not present

## 2015-02-20 DIAGNOSIS — J189 Pneumonia, unspecified organism: Secondary | ICD-10-CM | POA: Diagnosis not present

## 2015-02-20 DIAGNOSIS — J440 Chronic obstructive pulmonary disease with acute lower respiratory infection: Secondary | ICD-10-CM | POA: Diagnosis not present

## 2015-02-21 ENCOUNTER — Other Ambulatory Visit: Payer: Self-pay | Admitting: *Deleted

## 2015-02-21 NOTE — Patient Outreach (Signed)
[place] [facility]) Care Management  02/21/2015  RICKY DOAN 07/13/1932 [id number]   Transition of care  RN attempted to reach pt's daughter primary caregiver Anahlia Iseminger for services for this pt. RN able to leave a gneric voice message requesting a call back to intervene. Note previous conversation with pt's son Josph Macho) earlier in this week who requested RN connect with daughter for possible involvement. Note pt has [facility] also for RN/OT as informed by pt's son however limited with OT due to pt's memory. Will await response.  Raina Mina, RN Care Management Coordinator [place] [facility] Main Office [phone]

## 2015-02-24 ENCOUNTER — Other Ambulatory Visit: Payer: Self-pay | Admitting: *Deleted

## 2015-02-24 ENCOUNTER — Encounter: Payer: Self-pay | Admitting: *Deleted

## 2015-02-24 NOTE — Patient Outreach (Addendum)
Sarah Johnson) Care Management  02/24/2015  Sarah Johnson 09-24-1932 [id number]  Note pt with Dementia however independently with ADLs at this time and lives alone.  RN received a call back from pt's daughter Sarah Johnson) who was apologetic for not returning the call sooner. Caregiver daughter update's RN on pt's progress and reports pt has allowed ongoing services with [facility] and a RN visit's every other Saturday. States pt has Obsessive Compulsive Disorder and refuses a lot of services available to her. Daughter states she has refused [facility] and has indicated she does not want "anybody in her home" at this time and declined [facility] home visits or any other involvement at this time. Daughter tells an incident were her father was receiving home dialysis at one time and her mother was not pleased with all the equipment and did not like the idea of her spouse receiving these services in the home. The pt's spouse went back to the dialysis and soon after passed away. Daughter appreciative for the offer to visit the pt in the home as RN explained [facility] offers not just nursing but has other services such as social workers and pharmacy readily available to assist the pt's needs. Again daughter very appreciative however due to the pt's adamantly declining she will pass on the offer for [facility] involvement at this time. More was discussed on other medical issues this pt is facing at this time with skin cancer on the pt's head (needs a skin surgery) however pt not receptive and has a blood clot that pt is taking a "blood thinner" for at this time. RN discussed the alternative if pt and caregivers does not take the initiative and better manager the pt's care with recognizing early signs and symptoms of COPD and act on any distressful breathing or interventions that will reduce the pt's risk of flare ups with her COPD. Daughter states pt has declined home O2 even with the providers telling the pt to wear  only at night. Pt again was no adamant about not wearing the Home O2 that she indicated she was going to put the supplies "at the end of her driveway" for pick up because she was not going to use it.  At this time pt can still drive and pay her bills and daughter feels there is time to possible work with pt however grateful and inquired if she can contact this RN if services or resources are needed in the future. RN has verified name and contact and informed caregiver to contact his RN directly if there are any inquires for future services and needed resources. RN has informed caregiver that Dr. Jani Gravel will be notified of pt's refusal for [facility] involvement at this time (caregiver with clear understanding and again grateful for the ongoing follow up calls).   Raina Mina, RN Care Management Coordinator [place] [facility] Main Office [phone]

## 2015-02-24 NOTE — Patient Outreach (Signed)
[place] [facility]) Care Management  02/24/2015  Sarah Johnson July 24, 1932 [id number]   Outreach call (4th call)  RN unable to reach pt's caregiver will send out letter if no call back.   Raina Mina, RN Care Management Coordinator [place] [facility] Main Office [phone]

## 2015-02-25 ENCOUNTER — Encounter: Payer: Self-pay | Admitting: *Deleted

## 2015-02-25 NOTE — Patient Instructions (Signed)
n

## 2015-02-25 NOTE — Patient Outreach (Signed)
[place] [facility]) Care Management  02/25/2015  Sarah Johnson Jul 18, 1932 [id number]   Collaboration phone call to [facility] to discuss patient and [facility] involvement.  Per RNCM, patient has declined [facility] services at this time including social work, pharmacy and nursing.    Plan: RN has informed caregiver that Dr. Jani Gravel will be notified of pt's refusal for [facility] involvement at this time.   Case to be closed at this time.    Sheralyn Boatman [facility] Care Management [phone]

## 2015-02-27 NOTE — Patient Outreach (Signed)
[place] [facility]) Care Management  02/27/2015  BETRICE WANAT 1932/05/13 [id number]   Notification from Raina Mina, RN to close case due to patient refused [place] [facility] services.  Thanks, Ronnell Freshwater. [place], [place] Assistant Phone: [phone] Fax: [phone]

## 2015-03-01 DIAGNOSIS — A419 Sepsis, unspecified organism: Secondary | ICD-10-CM | POA: Diagnosis not present

## 2015-03-01 DIAGNOSIS — I129 Hypertensive chronic kidney disease with stage 1 through stage 4 chronic kidney disease, or unspecified chronic kidney disease: Secondary | ICD-10-CM | POA: Diagnosis not present

## 2015-03-01 DIAGNOSIS — J440 Chronic obstructive pulmonary disease with acute lower respiratory infection: Secondary | ICD-10-CM | POA: Diagnosis not present

## 2015-03-01 DIAGNOSIS — I82401 Acute embolism and thrombosis of unspecified deep veins of right lower extremity: Secondary | ICD-10-CM | POA: Diagnosis not present

## 2015-03-01 DIAGNOSIS — J189 Pneumonia, unspecified organism: Secondary | ICD-10-CM | POA: Diagnosis not present

## 2015-03-01 DIAGNOSIS — A047 Enterocolitis due to Clostridium difficile: Secondary | ICD-10-CM | POA: Diagnosis not present

## 2015-03-13 ENCOUNTER — Other Ambulatory Visit: Payer: Self-pay | Admitting: *Deleted

## 2015-03-13 MED ORDER — MOMETASONE FURO-FORMOTEROL FUM 100-5 MCG/ACT IN AERO
INHALATION_SPRAY | RESPIRATORY_TRACT | Status: DC
Start: 2015-03-13 — End: 2015-09-17

## 2015-03-22 DIAGNOSIS — J440 Chronic obstructive pulmonary disease with acute lower respiratory infection: Secondary | ICD-10-CM | POA: Diagnosis not present

## 2015-03-22 DIAGNOSIS — A419 Sepsis, unspecified organism: Secondary | ICD-10-CM | POA: Diagnosis not present

## 2015-03-22 DIAGNOSIS — A047 Enterocolitis due to Clostridium difficile: Secondary | ICD-10-CM | POA: Diagnosis not present

## 2015-03-22 DIAGNOSIS — J189 Pneumonia, unspecified organism: Secondary | ICD-10-CM | POA: Diagnosis not present

## 2015-03-22 DIAGNOSIS — I129 Hypertensive chronic kidney disease with stage 1 through stage 4 chronic kidney disease, or unspecified chronic kidney disease: Secondary | ICD-10-CM | POA: Diagnosis not present

## 2015-03-22 DIAGNOSIS — I82401 Acute embolism and thrombosis of unspecified deep veins of right lower extremity: Secondary | ICD-10-CM | POA: Diagnosis not present

## 2015-04-01 DIAGNOSIS — Z79899 Other long term (current) drug therapy: Secondary | ICD-10-CM | POA: Diagnosis not present

## 2015-04-01 DIAGNOSIS — I10 Essential (primary) hypertension: Secondary | ICD-10-CM | POA: Diagnosis not present

## 2015-04-01 DIAGNOSIS — L989 Disorder of the skin and subcutaneous tissue, unspecified: Secondary | ICD-10-CM | POA: Diagnosis not present

## 2015-04-01 DIAGNOSIS — Z418 Encounter for other procedures for purposes other than remedying health state: Secondary | ICD-10-CM | POA: Diagnosis not present

## 2015-04-01 DIAGNOSIS — D649 Anemia, unspecified: Secondary | ICD-10-CM | POA: Diagnosis not present

## 2015-04-01 DIAGNOSIS — E559 Vitamin D deficiency, unspecified: Secondary | ICD-10-CM | POA: Diagnosis not present

## 2015-04-01 DIAGNOSIS — E039 Hypothyroidism, unspecified: Secondary | ICD-10-CM | POA: Diagnosis not present

## 2015-04-01 DIAGNOSIS — K921 Melena: Secondary | ICD-10-CM | POA: Diagnosis not present

## 2015-04-02 DIAGNOSIS — C4441 Basal cell carcinoma of skin of scalp and neck: Secondary | ICD-10-CM | POA: Diagnosis not present

## 2015-04-09 DIAGNOSIS — M5489 Other dorsalgia: Secondary | ICD-10-CM | POA: Diagnosis not present

## 2015-04-09 DIAGNOSIS — J189 Pneumonia, unspecified organism: Secondary | ICD-10-CM | POA: Diagnosis not present

## 2015-04-09 DIAGNOSIS — M549 Dorsalgia, unspecified: Secondary | ICD-10-CM | POA: Diagnosis not present

## 2015-04-09 DIAGNOSIS — E060 Acute thyroiditis: Secondary | ICD-10-CM | POA: Diagnosis not present

## 2015-04-11 ENCOUNTER — Other Ambulatory Visit (HOSPITAL_COMMUNITY): Payer: Self-pay | Admitting: Internal Medicine

## 2015-04-11 DIAGNOSIS — E039 Hypothyroidism, unspecified: Secondary | ICD-10-CM | POA: Diagnosis not present

## 2015-04-11 DIAGNOSIS — M4854XA Collapsed vertebra, not elsewhere classified, thoracic region, initial encounter for fracture: Secondary | ICD-10-CM

## 2015-04-11 DIAGNOSIS — M810 Age-related osteoporosis without current pathological fracture: Secondary | ICD-10-CM | POA: Diagnosis not present

## 2015-04-11 DIAGNOSIS — E559 Vitamin D deficiency, unspecified: Secondary | ICD-10-CM | POA: Diagnosis not present

## 2015-04-12 DIAGNOSIS — I129 Hypertensive chronic kidney disease with stage 1 through stage 4 chronic kidney disease, or unspecified chronic kidney disease: Secondary | ICD-10-CM | POA: Diagnosis not present

## 2015-04-12 DIAGNOSIS — J189 Pneumonia, unspecified organism: Secondary | ICD-10-CM | POA: Diagnosis not present

## 2015-04-12 DIAGNOSIS — A047 Enterocolitis due to Clostridium difficile: Secondary | ICD-10-CM | POA: Diagnosis not present

## 2015-04-12 DIAGNOSIS — I82401 Acute embolism and thrombosis of unspecified deep veins of right lower extremity: Secondary | ICD-10-CM | POA: Diagnosis not present

## 2015-04-12 DIAGNOSIS — A419 Sepsis, unspecified organism: Secondary | ICD-10-CM | POA: Diagnosis not present

## 2015-04-12 DIAGNOSIS — J440 Chronic obstructive pulmonary disease with acute lower respiratory infection: Secondary | ICD-10-CM | POA: Diagnosis not present

## 2015-04-14 DIAGNOSIS — M4854XA Collapsed vertebra, not elsewhere classified, thoracic region, initial encounter for fracture: Secondary | ICD-10-CM | POA: Diagnosis not present

## 2015-04-18 DIAGNOSIS — M810 Age-related osteoporosis without current pathological fracture: Secondary | ICD-10-CM | POA: Diagnosis not present

## 2015-04-18 DIAGNOSIS — M546 Pain in thoracic spine: Secondary | ICD-10-CM | POA: Diagnosis not present

## 2015-04-18 DIAGNOSIS — E559 Vitamin D deficiency, unspecified: Secondary | ICD-10-CM | POA: Diagnosis not present

## 2015-04-18 DIAGNOSIS — E7800 Pure hypercholesterolemia, unspecified: Secondary | ICD-10-CM | POA: Diagnosis not present

## 2015-04-18 DIAGNOSIS — E039 Hypothyroidism, unspecified: Secondary | ICD-10-CM | POA: Diagnosis not present

## 2015-04-18 DIAGNOSIS — M4854XA Collapsed vertebra, not elsewhere classified, thoracic region, initial encounter for fracture: Secondary | ICD-10-CM | POA: Diagnosis not present

## 2015-04-30 DIAGNOSIS — M4854XA Collapsed vertebra, not elsewhere classified, thoracic region, initial encounter for fracture: Secondary | ICD-10-CM | POA: Diagnosis not present

## 2015-04-30 DIAGNOSIS — S22060A Wedge compression fracture of T7-T8 vertebra, initial encounter for closed fracture: Secondary | ICD-10-CM | POA: Diagnosis not present

## 2015-04-30 DIAGNOSIS — M549 Dorsalgia, unspecified: Secondary | ICD-10-CM | POA: Diagnosis not present

## 2015-05-06 DIAGNOSIS — M546 Pain in thoracic spine: Secondary | ICD-10-CM | POA: Diagnosis not present

## 2015-05-28 DIAGNOSIS — I10 Essential (primary) hypertension: Secondary | ICD-10-CM | POA: Diagnosis not present

## 2015-06-03 DIAGNOSIS — M5136 Other intervertebral disc degeneration, lumbar region: Secondary | ICD-10-CM | POA: Diagnosis not present

## 2015-06-03 DIAGNOSIS — I10 Essential (primary) hypertension: Secondary | ICD-10-CM | POA: Diagnosis not present

## 2015-06-03 DIAGNOSIS — M4316 Spondylolisthesis, lumbar region: Secondary | ICD-10-CM | POA: Diagnosis not present

## 2015-06-03 DIAGNOSIS — M545 Low back pain: Secondary | ICD-10-CM | POA: Diagnosis not present

## 2015-06-03 DIAGNOSIS — M810 Age-related osteoporosis without current pathological fracture: Secondary | ICD-10-CM | POA: Diagnosis not present

## 2015-06-03 DIAGNOSIS — E039 Hypothyroidism, unspecified: Secondary | ICD-10-CM | POA: Diagnosis not present

## 2015-06-03 DIAGNOSIS — E559 Vitamin D deficiency, unspecified: Secondary | ICD-10-CM | POA: Diagnosis not present

## 2015-06-17 DIAGNOSIS — M546 Pain in thoracic spine: Secondary | ICD-10-CM | POA: Diagnosis not present

## 2015-07-22 ENCOUNTER — Ambulatory Visit: Payer: Medicare Other | Admitting: Neurology

## 2015-09-17 ENCOUNTER — Other Ambulatory Visit: Payer: Self-pay

## 2015-09-17 MED ORDER — MOMETASONE FURO-FORMOTEROL FUM 100-5 MCG/ACT IN AERO: INHALATION_SPRAY | RESPIRATORY_TRACT | Status: AC

## 2015-09-17 NOTE — Telephone Encounter (Signed)
Received fax from [facility] for dulera refill request . Pt last seen PW 05/08/14. Pt aware appt is needed for further refills. appt made for 09/25/15 with TP. 1 refill [facility] to [facility].  Nothing further needed.

## 2015-09-25 ENCOUNTER — Encounter: Payer: Self-pay | Admitting: Adult Health

## 2015-09-25 ENCOUNTER — Telehealth: Payer: Self-pay | Admitting: Adult Health

## 2015-09-25 ENCOUNTER — Ambulatory Visit (INDEPENDENT_AMBULATORY_CARE_PROVIDER_SITE_OTHER): Payer: Medicare Other | Admitting: Adult Health

## 2015-09-25 VITALS — BP 110/68 | HR 64 | Temp 98.0°F | Ht 62.0 in | Wt 127.0 lb

## 2015-09-25 DIAGNOSIS — E039 Hypothyroidism, unspecified: Secondary | ICD-10-CM | POA: Diagnosis not present

## 2015-09-25 DIAGNOSIS — J449 Chronic obstructive pulmonary disease, unspecified: Secondary | ICD-10-CM

## 2015-09-25 DIAGNOSIS — I10 Essential (primary) hypertension: Secondary | ICD-10-CM | POA: Diagnosis not present

## 2015-09-25 DIAGNOSIS — J441 Chronic obstructive pulmonary disease with (acute) exacerbation: Secondary | ICD-10-CM | POA: Insufficient documentation

## 2015-09-25 DIAGNOSIS — E559 Vitamin D deficiency, unspecified: Secondary | ICD-10-CM | POA: Diagnosis not present

## 2015-09-25 NOTE — Telephone Encounter (Signed)
Pt daughter returning call.Stanley A Dalton ° °

## 2015-09-25 NOTE — Telephone Encounter (Signed)
Spoke with pt's daughter. She wanted to know what the pt's oxygen level was today. This was told to her. Nothing further was needed.

## 2015-09-25 NOTE — Assessment & Plan Note (Signed)
Well compensated on regimen   Plan  Continue on Dulera 2 puffs Twice daily  , rinse after use.  Follow up with Dr. Ashok Cordia in 1 year and As needed   Please contact office for sooner follow up if symptoms do not improve or worsen or seek emergency care

## 2015-09-25 NOTE — Patient Instructions (Signed)
Continue on Dulera 2 puffs Twice daily  , rinse after use.  Follow up with Dr. Ashok Cordia in 1 year and As needed   Please contact office for sooner follow up if symptoms do not improve or worsen or seek emergency care

## 2015-09-25 NOTE — Progress Notes (Signed)
Subjective:    Patient ID: Sarah Johnson, female    DOB: 08-Jun-1932, 82 y.o.   MRN: [id number]  HPI This is a 82 y.o.  WF  woman with COPD and primary emphysema.  Pt dx with COPD and primary emphysema 6/09 when being eval for Thoracic aortic aneurysm   09/25/2015 Follow up : COPD Patient returns for yearly follow-up for her COPD Says that she's been doing well overall.  She denies any flare of cough, wheezing or shortness of breath. PVX is utd and Prevnar tud.  Remains on Dulera Twice daily  .  No increased SABA use.   Past Medical History  Diagnosis Date  . Emphysema     followed by Dr. Joya Gaskins  . S/P thoracic aortic aneurysm repair 1/10    also with bioprosthetic aortic valve prelacement [facility]). in 10/10 she has a descending thoraic aorta anuerysm and abdominal reapir [facility]). Vascular urgeon is Dr. Lunette Stands.   . Pulmonary embolism ([place]) 2010    She is no onger taking coumadin  . Hypothyroidism   . Blind right eye   . Hyperlipidemia   . S/P aortic valve replacement with bioprosthetic valve     ehco (8/11) with EF 55-60%, mild-mod MR, mild-mod TR, poorly visualized bioprosthetic aortic valve but no significant regurgitation and gradient not significanly elevated.    . CKD (chronic kidney disease)   . HTN (hypertension)   . History of Doppler echocardiogram     carotid dopplers. (8/11) no significant stenosis  . COPD (chronic obstructive pulmonary disease) ([place])   . SVT (supraventricular tachycardia) (H[facility])     HOLTER MONITOR AFTER SURGERY   . GERD (gastroesophageal reflux disease)   . Arthritis   . Cataract     RT  . Hx of echocardiogram 2015    Echo (08/2013):  EF 60-65%, no RWMA, Gr 1 DD, AVR ok (mean 5 mmHg), mild MR, PASP 22 mmHg     Family History  Problem Relation Age of Onset  . Brain cancer Maternal Grandmother   . Coronary artery disease      no premature CAD     Social History   Social History  . Marital Status: Married    Spouse Name: N/A   . Number of Children: 3  . Years of Education: N/A   Occupational History  . Retired    Social History Main Topics  . Smoking status: Former Smoker -- 1.00 packs/day for 20 years    Types: Cigarettes    Quit date: 04/26/2004  . Smokeless tobacco: Never Used     Comment: quit in 2006  . Alcohol Use: No  . Drug Use: No  . Sexual Activity: Yes    Birth Control/ Protection: Post-menopausal   Other Topics Concern  . 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 Codeine Other (See Comments)    Pt. States it has been too long to remember the reaction   . Penicillins Other (See Comments)    Pt. Does not recall the reaction.  Has patient had a PCN reaction causing immediate rash, facial/tongue/throat swelling, SOB or lightheadedness with hypotension:  Has patient had a PCN reaction causing severe rash involving mucus membranes or skin necrosis: Has patient had a PCN reaction that required hospitalization  Has patient had a PCN reaction occurring within the  last 10 years:  If all of the above answers are "NO", then may proceed with Cephalosporin use.      Outpatient Prescriptions Prior to Visit  Medication Sig Dispense Refill  . atorvastatin (LIPITOR) 20 MG tablet TAKE 1 TABLET BY MOUTH EVERY DAY 30 tablet 3  . Calcium Carbonate-Vitamin D (CALCIUM-VITAMIN D) 600-200 MG-UNIT CAPS Take 1 tablet by mouth daily.    Marland Kitchen donepezil (ARICEPT) 10 MG tablet TAKE 1 TABLET (10 MG TOTAL) BY MOUTH AT BEDTIME. 30 tablet 0  . ergocalciferol (VITAMIN D2) 50000 UNITS capsule Take 50,000 Units by mouth every 30 (thirty) days.    Marland Kitchen ibuprofen (ADVIL,MOTRIN) 200 MG tablet Take 2 tablets (400 mg total) by mouth daily as needed for moderate pain. 30 tablet 0  . levothyroxine (SYNTHROID, LEVOTHROID) 112 MCG tablet Take 112 mcg by mouth daily.  12  . metoprolol tartrate  (LOPRESSOR) 25 MG tablet Take 0.5 tablets (12.5 mg total) by mouth 2 (two) times daily. 60 tablet 0  . mometasone-formoterol (DULERA) 100-5 MCG/ACT AERO INHALE 2 PUFFS INTO THE LUNGS 2 TIMES DAILY. RINSE MOUTH AFTER EACH USE 1 Inhaler 0  . Multiple Vitamin (MULITIVITAMIN WITH MINERALS) TABS Take 1 tablet by mouth daily.    Marland Kitchen PROAIR HFA 108 (90 BASE) MCG/ACT inhaler Inhale 2 puffs into the lungs every 6 (six) hours as needed. (Patient taking differently: Inhale 2 puffs into the lungs every 6 (six) hours as needed for wheezing or shortness of breath. ) 16 g 11  . Rivaroxaban (XARELTO) 15 MG TABS tablet Take 1 tablet (15 mg total) by mouth 2 (two) times daily with a meal. 36 tablet 0  . saccharomyces boulardii (FLORASTOR) 250 MG capsule Take 1 capsule (250 mg total) by mouth 2 (two) times daily. 60 capsule 0  . sertraline (ZOLOFT) 50 MG tablet Take 25 mg by mouth daily.     Marland Kitchen thiamine 100 MG tablet Take 1 tablet (100 mg total) by mouth daily. 30 tablet 0  . aspirin EC 81 MG tablet Take 81 mg by mouth daily. Reported on 09/25/2015    . ibandronate (BONIVA) 150 MG tablet Take 150 mg by mouth every 30 (thirty) days. Reported on 09/25/2015    . NAMENDA XR 28 MG CP24 Take 1 tablet by mouth daily. Reported on 09/25/2015    . rivaroxaban (XARELTO) 20 MG TABS tablet Take 1 tablet (20 mg total) by mouth daily with supper. (Patient not taking: Reported on 09/25/2015) 30 tablet 3  . vancomycin (VANCOCIN HCL) 125 MG capsule TAKE 125MG  PO BID FOR 7 DAYS TILL 10/26, THEN 125MG  PO QD FOR 7 DAYS TILL11/2, THEN 125 EVERY 2DAYS FOR 4WEEKS. (Patient not taking: Reported on 09/25/2015) 35 capsule 0   No facility-administered medications prior to visit.     Review of Systems  Constitutional:   No  weight loss, night sweats,  Fevers, chills, fatigue, lassitude. HEENT:   No headaches,  Difficulty swallowing,  Tooth/dental problems,  Sore throat,                No sneezing, itching, ear ache, nasal congestion, post nasal drip,    CV:  No chest pain,  Orthopnea, PND, swelling in lower extremities, anasarca, dizziness, palpitations  GI  No heartburn, indigestion, abdominal pain, nausea, vomiting, diarrhea, change in bowel habits, loss of appetite  Resp:  No coughing up of blood.  Marland Kitchen  No chest wall deformity  Skin: no rash or lesions.  GU: no dysuria, change in color of urine,  no urgency or frequency.  No flank pain.  MS:  No joint pain or swelling.    Psych:  No change in mood or affect. No depression or anxiety.  No memory loss.     Objective:   Physical Exam Filed Vitals:   09/25/15 0928  BP: 110/68  Pulse: 64  Temp: 98 F (36.7 C)  TempSrc: Oral  Height: 5\' 2"  (1.575 m)  Weight: 127 lb (57.607 kg)  SpO2: 90%    Gen: Pleasant, elderly in no distress,  normal affect  ENT: No lesions,  mouth clear,  oropharynx clear, no postnasal drip  Neck: No JVD, no TMG, no carotid bruits  Lungs: Coarse BS w/ no wheezing or rales.  no dullness to percussion, distant BS  Cardiovascular: RRR, heart sounds normal, no murmur or gallops, no peripheral edema  Abdomen: soft and NT, no HSM,  BS normal  Musculoskeletal: No deformities, no cyanosis or clubbing  No edema, neg homans sign,   Neuro: alert, non focal  Skin: Warm, no lesions or rashes      Tammy Parrett NP-C  [facility]  09/25/2015

## 2015-09-25 NOTE — Telephone Encounter (Signed)
lmtcb x1 for pt's daughter, Drue Dun.

## 2015-10-02 DIAGNOSIS — E039 Hypothyroidism, unspecified: Secondary | ICD-10-CM | POA: Diagnosis not present

## 2015-10-02 DIAGNOSIS — E559 Vitamin D deficiency, unspecified: Secondary | ICD-10-CM | POA: Diagnosis not present

## 2015-10-02 DIAGNOSIS — R413 Other amnesia: Secondary | ICD-10-CM | POA: Diagnosis not present

## 2015-10-02 DIAGNOSIS — I10 Essential (primary) hypertension: Secondary | ICD-10-CM | POA: Diagnosis not present

## 2015-11-06 DIAGNOSIS — I10 Essential (primary) hypertension: Secondary | ICD-10-CM | POA: Diagnosis not present

## 2015-11-11 DIAGNOSIS — R413 Other amnesia: Secondary | ICD-10-CM | POA: Diagnosis not present

## 2015-11-11 DIAGNOSIS — I10 Essential (primary) hypertension: Secondary | ICD-10-CM | POA: Diagnosis not present

## 2015-11-26 ENCOUNTER — Ambulatory Visit (HOSPITAL_BASED_OUTPATIENT_CLINIC_OR_DEPARTMENT_OTHER)
Admission: RE | Admit: 2015-11-26 | Discharge: 2015-11-26 | Disposition: A | Discharge status: Home / Self Care | Payer: Medicare Other | Source: Ambulatory Visit | Attending: Internal Medicine | Admitting: Internal Medicine

## 2015-11-26 ENCOUNTER — Other Ambulatory Visit (HOSPITAL_COMMUNITY): Payer: Self-pay | Admitting: Internal Medicine

## 2015-11-26 DIAGNOSIS — L0390 Cellulitis, unspecified: Secondary | ICD-10-CM | POA: Diagnosis not present

## 2015-11-26 DIAGNOSIS — L03113 Cellulitis of right upper limb: Secondary | ICD-10-CM | POA: Diagnosis not present

## 2015-11-26 DIAGNOSIS — M7989 Other specified soft tissue disorders: Secondary | ICD-10-CM

## 2015-11-26 DIAGNOSIS — M11241 Other chondrocalcinosis, right hand: Secondary | ICD-10-CM | POA: Diagnosis not present

## 2015-11-26 DIAGNOSIS — M79601 Pain in right arm: Secondary | ICD-10-CM | POA: Diagnosis not present

## 2015-11-26 DIAGNOSIS — L03114 Cellulitis of left upper limb: Secondary | ICD-10-CM | POA: Diagnosis not present

## 2015-11-26 DIAGNOSIS — M25531 Pain in right wrist: Secondary | ICD-10-CM | POA: Diagnosis not present

## 2015-11-26 DIAGNOSIS — M79641 Pain in right hand: Secondary | ICD-10-CM | POA: Diagnosis not present

## 2015-11-26 DIAGNOSIS — R52 Pain, unspecified: Secondary | ICD-10-CM

## 2015-11-26 DIAGNOSIS — I712 Thoracic aortic aneurysm, without rupture: Secondary | ICD-10-CM | POA: Diagnosis not present

## 2015-11-26 DIAGNOSIS — F0390 Unspecified dementia without behavioral disturbance: Secondary | ICD-10-CM | POA: Diagnosis not present

## 2015-11-26 DIAGNOSIS — G9340 Encephalopathy, unspecified: Secondary | ICD-10-CM | POA: Diagnosis not present

## 2015-11-26 DIAGNOSIS — J449 Chronic obstructive pulmonary disease, unspecified: Secondary | ICD-10-CM | POA: Diagnosis not present

## 2015-11-26 DIAGNOSIS — M436 Torticollis: Secondary | ICD-10-CM | POA: Diagnosis not present

## 2015-11-26 DIAGNOSIS — M5030 Other cervical disc degeneration, unspecified cervical region: Secondary | ICD-10-CM | POA: Diagnosis not present

## 2015-11-26 NOTE — Progress Notes (Addendum)
VASCULAR LAB PRELIMINARY  PRELIMINARY  PRELIMINARY  PRELIMINARY  Right upper extremity venous duplex has been completed.    Right arm-  No evidence of DVT or superficial thrombosis.    Called with results left message with Olivia Mackie.  Janifer Adie, RVT, RDMS 11/26/2015, 5:04 PM

## 2015-11-28 ENCOUNTER — Encounter (HOSPITAL_COMMUNITY): Payer: Self-pay

## 2015-11-28 ENCOUNTER — Emergency Department (HOSPITAL_COMMUNITY): Payer: Medicare Other

## 2015-11-28 ENCOUNTER — Inpatient Hospital Stay (HOSPITAL_COMMUNITY)
Admission: EM | Admit: 2015-11-28 | Discharge: 2015-12-01 | DRG: 602 | Disposition: A | Discharge status: Home / Self Care | Payer: Medicare Other | Attending: Internal Medicine | Admitting: Internal Medicine

## 2015-11-28 DIAGNOSIS — Z87891 Personal history of nicotine dependence: Secondary | ICD-10-CM | POA: Diagnosis not present

## 2015-11-28 DIAGNOSIS — E785 Hyperlipidemia, unspecified: Secondary | ICD-10-CM | POA: Diagnosis present

## 2015-11-28 DIAGNOSIS — Z86718 Personal history of other venous thrombosis and embolism: Secondary | ICD-10-CM

## 2015-11-28 DIAGNOSIS — M79601 Pain in right arm: Secondary | ICD-10-CM | POA: Diagnosis not present

## 2015-11-28 DIAGNOSIS — M11241 Other chondrocalcinosis, right hand: Secondary | ICD-10-CM | POA: Diagnosis present

## 2015-11-28 DIAGNOSIS — E039 Hypothyroidism, unspecified: Secondary | ICD-10-CM | POA: Diagnosis present

## 2015-11-28 DIAGNOSIS — N182 Chronic kidney disease, stage 2 (mild): Secondary | ICD-10-CM | POA: Diagnosis present

## 2015-11-28 DIAGNOSIS — Z880 Allergy status to penicillin: Secondary | ICD-10-CM

## 2015-11-28 DIAGNOSIS — N179 Acute kidney failure, unspecified: Secondary | ICD-10-CM | POA: Diagnosis present

## 2015-11-28 DIAGNOSIS — Z8619 Personal history of other infectious and parasitic diseases: Secondary | ICD-10-CM

## 2015-11-28 DIAGNOSIS — L0390 Cellulitis, unspecified: Secondary | ICD-10-CM | POA: Diagnosis not present

## 2015-11-28 DIAGNOSIS — M25531 Pain in right wrist: Secondary | ICD-10-CM | POA: Diagnosis not present

## 2015-11-28 DIAGNOSIS — I129 Hypertensive chronic kidney disease with stage 1 through stage 4 chronic kidney disease, or unspecified chronic kidney disease: Secondary | ICD-10-CM | POA: Diagnosis present

## 2015-11-28 DIAGNOSIS — Z7901 Long term (current) use of anticoagulants: Secondary | ICD-10-CM

## 2015-11-28 DIAGNOSIS — I10 Essential (primary) hypertension: Secondary | ICD-10-CM | POA: Diagnosis present

## 2015-11-28 DIAGNOSIS — M436 Torticollis: Secondary | ICD-10-CM | POA: Diagnosis not present

## 2015-11-28 DIAGNOSIS — H9190 Unspecified hearing loss, unspecified ear: Secondary | ICD-10-CM | POA: Diagnosis present

## 2015-11-28 DIAGNOSIS — Z7951 Long term (current) use of inhaled steroids: Secondary | ICD-10-CM

## 2015-11-28 DIAGNOSIS — Z86711 Personal history of pulmonary embolism: Secondary | ICD-10-CM | POA: Diagnosis not present

## 2015-11-28 DIAGNOSIS — M7989 Other specified soft tissue disorders: Secondary | ICD-10-CM | POA: Diagnosis not present

## 2015-11-28 DIAGNOSIS — G9340 Encephalopathy, unspecified: Secondary | ICD-10-CM | POA: Diagnosis present

## 2015-11-28 DIAGNOSIS — J449 Chronic obstructive pulmonary disease, unspecified: Secondary | ICD-10-CM | POA: Diagnosis present

## 2015-11-28 DIAGNOSIS — K219 Gastro-esophageal reflux disease without esophagitis: Secondary | ICD-10-CM | POA: Diagnosis present

## 2015-11-28 DIAGNOSIS — Z8701 Personal history of pneumonia (recurrent): Secondary | ICD-10-CM

## 2015-11-28 DIAGNOSIS — M5030 Other cervical disc degeneration, unspecified cervical region: Secondary | ICD-10-CM | POA: Diagnosis not present

## 2015-11-28 DIAGNOSIS — I82401 Acute embolism and thrombosis of unspecified deep veins of right lower extremity: Secondary | ICD-10-CM | POA: Diagnosis not present

## 2015-11-28 DIAGNOSIS — L03113 Cellulitis of right upper limb: Secondary | ICD-10-CM | POA: Diagnosis not present

## 2015-11-28 DIAGNOSIS — R0602 Shortness of breath: Secondary | ICD-10-CM | POA: Diagnosis not present

## 2015-11-28 DIAGNOSIS — R0600 Dyspnea, unspecified: Secondary | ICD-10-CM

## 2015-11-28 DIAGNOSIS — Z885 Allergy status to narcotic agent status: Secondary | ICD-10-CM

## 2015-11-28 DIAGNOSIS — Z808 Family history of malignant neoplasm of other organs or systems: Secondary | ICD-10-CM

## 2015-11-28 DIAGNOSIS — J441 Chronic obstructive pulmonary disease with (acute) exacerbation: Secondary | ICD-10-CM | POA: Diagnosis present

## 2015-11-28 DIAGNOSIS — D631 Anemia in chronic kidney disease: Secondary | ICD-10-CM | POA: Diagnosis present

## 2015-11-28 DIAGNOSIS — N183 Chronic kidney disease, stage 3 unspecified: Secondary | ICD-10-CM | POA: Diagnosis present

## 2015-11-28 DIAGNOSIS — I712 Thoracic aortic aneurysm, without rupture: Secondary | ICD-10-CM | POA: Diagnosis present

## 2015-11-28 DIAGNOSIS — L089 Local infection of the skin and subcutaneous tissue, unspecified: Secondary | ICD-10-CM

## 2015-11-28 DIAGNOSIS — F0390 Unspecified dementia without behavioral disturbance: Secondary | ICD-10-CM | POA: Diagnosis present

## 2015-11-28 DIAGNOSIS — Z954 Presence of other heart-valve replacement: Secondary | ICD-10-CM

## 2015-11-28 DIAGNOSIS — H5441 Blindness, right eye, normal vision left eye: Secondary | ICD-10-CM | POA: Diagnosis present

## 2015-11-28 DIAGNOSIS — Z952 Presence of prosthetic heart valve: Secondary | ICD-10-CM

## 2015-11-28 DIAGNOSIS — Z953 Presence of xenogenic heart valve: Secondary | ICD-10-CM

## 2015-11-28 DIAGNOSIS — Z8249 Family history of ischemic heart disease and other diseases of the circulatory system: Secondary | ICD-10-CM

## 2015-11-28 DIAGNOSIS — M542 Cervicalgia: Secondary | ICD-10-CM | POA: Diagnosis present

## 2015-11-28 DIAGNOSIS — L03114 Cellulitis of left upper limb: Secondary | ICD-10-CM | POA: Diagnosis not present

## 2015-11-28 DIAGNOSIS — Z79899 Other long term (current) drug therapy: Secondary | ICD-10-CM

## 2015-11-28 LAB — COMPREHENSIVE METABOLIC PANEL
ALT: 22 U/L (ref 14–54)
AST: 37 U/L (ref 15–41)
Albumin: 3.5 g/dL (ref 3.5–5.0)
Alkaline Phosphatase: 94 U/L (ref 38–126)
Anion gap: 9 (ref 5–15)
BUN: 22 mg/dL — ABNORMAL HIGH (ref 6–20)
CHLORIDE: 105 mmol/L (ref 101–111)
CO2: 23 mmol/L (ref 22–32)
CREATININE: 1.17 mg/dL — AB (ref 0.44–1.00)
Calcium: 9.3 mg/dL (ref 8.9–10.3)
GFR calc non Af Amer: 43 mL/min — ABNORMAL LOW (ref >60)
GFR, EST AFRICAN AMERICAN: 50 mL/min — AB (ref >60)
Glucose, Bld: 99 mg/dL (ref 65–99)
Potassium: 4.2 mmol/L (ref 3.5–5.1)
SODIUM: 137 mmol/L (ref 135–145)
Total Bilirubin: 0.5 mg/dL (ref 0.3–1.2)
Total Protein: 7.7 g/dL (ref 6.5–8.1)

## 2015-11-28 LAB — CBC WITH DIFFERENTIAL/PLATELET
Basophils Absolute: 0 10*3/uL (ref 0.0–0.1)
Basophils Relative: 0 %
EOS ABS: 0.1 10*3/uL (ref 0.0–0.7)
Eosinophils Relative: 1 %
HEMATOCRIT: 38.1 % (ref 36.0–46.0)
HEMOGLOBIN: 12.4 g/dL (ref 12.0–15.0)
LYMPHS ABS: 2.1 10*3/uL (ref 0.7–4.0)
LYMPHS PCT: 15 %
MCH: 28.3 pg (ref 26.0–34.0)
MCHC: 32.5 g/dL (ref 30.0–36.0)
MCV: 87 fL (ref 78.0–100.0)
MONOS PCT: 15 %
Monocytes Absolute: 2.1 10*3/uL — ABNORMAL HIGH (ref 0.1–1.0)
NEUTROS ABS: 9.6 10*3/uL — AB (ref 1.7–7.7)
NEUTROS PCT: 69 %
Platelets: 233 10*3/uL (ref 150–400)
RBC: 4.38 MIL/uL (ref 3.87–5.11)
RDW: 16.3 % — ABNORMAL HIGH (ref 11.5–15.5)
WBC: 13.9 10*3/uL — ABNORMAL HIGH (ref 4.0–10.5)

## 2015-11-28 LAB — URINALYSIS, ROUTINE W REFLEX MICROSCOPIC
Bilirubin Urine: NEGATIVE
Glucose, UA: NEGATIVE mg/dL
Hgb urine dipstick: NEGATIVE
Ketones, ur: NEGATIVE mg/dL
Leukocytes, UA: NEGATIVE
Nitrite: NEGATIVE
Protein, ur: 30 mg/dL — AB
Specific Gravity, Urine: 1.027 (ref 1.005–1.030)
pH: 5.5 (ref 5.0–8.0)

## 2015-11-28 LAB — URINE MICROSCOPIC-ADD ON

## 2015-11-28 LAB — I-STAT CG4 LACTIC ACID, ED: Lactic Acid, Venous: 0.88 mmol/L (ref 0.5–1.9)

## 2015-11-28 MED ORDER — ACETAMINOPHEN 650 MG RE SUPP: 650.0000 mg | Freq: Four times a day (QID) | RECTAL | Status: DC | PRN

## 2015-11-28 MED ORDER — SODIUM CHLORIDE 0.9 % IV SOLN: INTRAVENOUS | Status: AC

## 2015-11-28 MED ORDER — ALBUTEROL SULFATE (2.5 MG/3ML) 0.083% IN NEBU: 2.5000 mg | INHALATION_SOLUTION | RESPIRATORY_TRACT | Status: DC | PRN

## 2015-11-28 MED ORDER — SACCHAROMYCES BOULARDII 250 MG PO CAPS
250.0000 mg | ORAL_CAPSULE | Freq: Two times a day (BID) | ORAL | Status: DC
  Administered 2015-11-28 – 2015-12-01 (×6): 250 mg via ORAL
  Filled 2015-11-28 (×6): qty 1

## 2015-11-28 MED ORDER — POLYETHYLENE GLYCOL 3350 17 G PO PACK: 17.0000 g | PACK | Freq: Every day | ORAL | Status: DC | PRN

## 2015-11-28 MED ORDER — VANCOMYCIN HCL IN DEXTROSE 1-5 GM/200ML-% IV SOLN
1000.0000 mg | Freq: Once | INTRAVENOUS | Status: AC
  Administered 2015-11-28: 1000 mg via INTRAVENOUS
  Filled 2015-11-28: qty 200

## 2015-11-28 MED ORDER — OXYCODONE HCL 5 MG PO TABS
5.0000 mg | ORAL_TABLET | ORAL | Status: DC | PRN
  Administered 2015-11-29 – 2015-11-30 (×3): 5 mg via ORAL
  Filled 2015-11-28 (×3): qty 1

## 2015-11-28 MED ORDER — LEVOTHYROXINE SODIUM 112 MCG PO TABS
112.0000 ug | ORAL_TABLET | Freq: Every day | ORAL | Status: DC
  Administered 2015-11-29 – 2015-12-01 (×3): 112 ug via ORAL
  Filled 2015-11-28 (×3): qty 1

## 2015-11-28 MED ORDER — VANCOMYCIN HCL 500 MG IV SOLR
500.0000 mg | Freq: Two times a day (BID) | INTRAVENOUS | Status: DC
  Administered 2015-11-29: 500 mg via INTRAVENOUS
  Filled 2015-11-28 (×2): qty 500

## 2015-11-28 MED ORDER — SODIUM CHLORIDE 0.9 % IV SOLN
INTRAVENOUS | Status: DC
  Administered 2015-11-28: 13:00:00 via INTRAVENOUS

## 2015-11-28 MED ORDER — SERTRALINE HCL 50 MG PO TABS
50.0000 mg | ORAL_TABLET | Freq: Every day | ORAL | Status: DC
  Administered 2015-11-28 – 2015-11-30 (×3): 50 mg via ORAL
  Filled 2015-11-28 (×3): qty 1

## 2015-11-28 MED ORDER — METOPROLOL TARTRATE 25 MG PO TABS
12.5000 mg | ORAL_TABLET | Freq: Two times a day (BID) | ORAL | Status: DC
  Administered 2015-11-28 – 2015-12-01 (×6): 12.5 mg via ORAL
  Filled 2015-11-28 (×6): qty 1

## 2015-11-28 MED ORDER — MOMETASONE FURO-FORMOTEROL FUM 100-5 MCG/ACT IN AERO
2.0000 | INHALATION_SPRAY | Freq: Two times a day (BID) | RESPIRATORY_TRACT | Status: DC
  Administered 2015-11-28 – 2015-12-01 (×6): 2 via RESPIRATORY_TRACT
  Filled 2015-11-28: qty 8.8

## 2015-11-28 MED ORDER — IPRATROPIUM-ALBUTEROL 0.5-2.5 (3) MG/3ML IN SOLN
3.0000 mL | Freq: Two times a day (BID) | RESPIRATORY_TRACT | Status: DC
  Administered 2015-11-28: 3 mL via RESPIRATORY_TRACT
  Filled 2015-11-28: qty 3

## 2015-11-28 MED ORDER — ADULT MULTIVITAMIN W/MINERALS CH
1.0000 | ORAL_TABLET | Freq: Every day | ORAL | Status: DC
  Administered 2015-11-29 – 2015-12-01 (×3): 1 via ORAL
  Filled 2015-11-28 (×3): qty 1

## 2015-11-28 MED ORDER — MEMANTINE HCL 10 MG PO TABS
10.0000 mg | ORAL_TABLET | Freq: Every day | ORAL | Status: DC
  Administered 2015-11-29 – 2015-12-01 (×3): 10 mg via ORAL
  Filled 2015-11-28 (×3): qty 1

## 2015-11-28 MED ORDER — ACETAMINOPHEN 325 MG PO TABS: 650.0000 mg | ORAL_TABLET | Freq: Four times a day (QID) | ORAL | Status: DC | PRN

## 2015-11-28 MED ORDER — CALCIUM CARBONATE-VITAMIN D 500-200 MG-UNIT PO TABS
1.0000 | ORAL_TABLET | Freq: Every day | ORAL | Status: DC
  Administered 2015-11-29 – 2015-12-01 (×3): 1 via ORAL
  Filled 2015-11-28 (×3): qty 1

## 2015-11-28 MED ORDER — PANTOPRAZOLE SODIUM 40 MG PO TBEC
40.0000 mg | DELAYED_RELEASE_TABLET | Freq: Every day | ORAL | Status: DC
  Administered 2015-11-29 – 2015-12-01 (×3): 40 mg via ORAL
  Filled 2015-11-28 (×3): qty 1

## 2015-11-28 MED ORDER — DONEPEZIL HCL 10 MG PO TABS
5.0000 mg | ORAL_TABLET | Freq: Every day | ORAL | Status: DC
  Administered 2015-11-28 – 2015-11-30 (×3): 5 mg via ORAL
  Filled 2015-11-28 (×3): qty 1

## 2015-11-28 MED ORDER — ATORVASTATIN CALCIUM 10 MG PO TABS
20.0000 mg | ORAL_TABLET | Freq: Every day | ORAL | Status: DC
  Administered 2015-11-28 – 2015-12-01 (×4): 20 mg via ORAL
  Filled 2015-11-28 (×4): qty 2

## 2015-11-28 MED ORDER — ONDANSETRON HCL 4 MG PO TABS: 4.0000 mg | ORAL_TABLET | Freq: Four times a day (QID) | ORAL | Status: DC | PRN

## 2015-11-28 MED ORDER — RIVAROXABAN 15 MG PO TABS
15.0000 mg | ORAL_TABLET | Freq: Every day | ORAL | Status: DC
  Administered 2015-11-28 – 2015-11-30 (×3): 15 mg via ORAL
  Filled 2015-11-28 (×5): qty 1

## 2015-11-28 MED ORDER — SODIUM CHLORIDE 0.9 % IV BOLUS (SEPSIS)
1000.0000 mL | Freq: Once | INTRAVENOUS | Status: AC
  Administered 2015-11-28: 1000 mL via INTRAVENOUS

## 2015-11-28 MED ORDER — ONDANSETRON HCL 4 MG/2ML IJ SOLN: 4.0000 mg | Freq: Four times a day (QID) | INTRAMUSCULAR | Status: DC | PRN

## 2015-11-28 MED ORDER — VITAMIN D (ERGOCALCIFEROL) 1.25 MG (50000 UNIT) PO CAPS: 50000.0000 [IU] | ORAL_CAPSULE | ORAL | Status: DC

## 2015-11-28 NOTE — H&P (Signed)
HISTORY AND PHYSICAL       PATIENT DETAILS Name: Sarah Johnson Age: 82 y.o. Sex: female Date of Birth: November 21, 1932 Admit Date: 11/28/2015 [id number] Sarah Mercury, MD   Patient coming from: Home  CHIEF COMPLAINT:  Right hand erythema, pain since this past Wednesday Some confusion for the past 1 day  HPI: Sarah Johnson is a 82 y.o. female with medical history significant of dementia, hypertension, COPD, history of venous thromboembolism on anticoagulation with Xarelto, history of bioprosthetic aortic valve replacement/aortic aneurysm repair in 2010 brought to the ED by her family for evaluation of right hand erythema and swelling along with worsening of her confusion. Per family, patient was in her usual state of health till daily/Wednesday when they noted that she started having pain and erythema in her dorsal aspect of her right hand and wrist area. She was subsequently seen by her primary care practitioner and started on doxycycline. Although some erythema in her dorsum of the hand has improved, erythema has now started to track to the palmar aspect of her wrist. She is also started to develop worsening of her confusion (has baseline dementia and is hard of hearing).   Patient and family also complains of some neck pain that started 1-2 days back, after she woke up one morning. There is no headaches. Family things that the patient was sleeping and not position that resulted in this problem.   There is no history of fever, nausea, vomiting or headache. There is no history of chest pain or shortness of breath No history of nausea, vomiting or diarrhea.    ED Course:  Found to have erythema/cellulitis of her right hand, given intravenous vancomycin. Hospitalist service consulted for further evaluation and treatment.  Note: Lives at: Home Mobility:  Independent Chronic Indwelling Foley:No   REVIEW OF SYSTEMS:  Constitutional:   No  weight loss, night sweats,  Fevers,  chills, fatigue.  HEENT:    No headaches, Dysphagia,Tooth/dental problems,Sore throat  Cardio-vascular: No chest pain,Orthopnea, PND,lower extremity edema, anasarca, palpitations  GI:  No heartburn, indigestion, abdominal pain, nausea, vomiting, diarrhea, melena or hematochezia  Resp: No shortness of breath, cough, hemoptysis,plueritic chest pain.   Skin:  No rash or lesions.  GU:  No dysuria, change in color of urine, no urgency or frequency.  No flank pain.  Musculoskeletal:  No back pain.  Endocrine: No heat intolerance, no cold intolerance, no polyuria, no polydipsia   ALLERGIES:   Allergies  Allergen Reactions  . Codeine Other (See Comments)    Pt. States it has been too long to remember the reaction   . Penicillins Other (See Comments)    Pt. Does not recall the reaction.  Has patient had a PCN reaction causing immediate rash, facial/tongue/throat swelling, SOB or lightheadedness with hypotension:  Has patient had a PCN reaction causing severe rash involving mucus membranes or skin necrosis: Has patient had a PCN reaction that required hospitalization  Has patient had a PCN reaction occurring within the last 10 years:  If all of the above answers are "NO", then may proceed with Cephalosporin use.     PAST MEDICAL HISTORY: Past Medical History:  Diagnosis Date  . Arthritis   . Blind right eye   . Cataract    RT  . CKD (chronic kidney disease)   . COPD (chronic obstructive pulmonary disease) ([place])   . Emphysema    followed by Dr. Joya Gaskins  . GERD (gastroesophageal reflux disease)   .  History of Doppler echocardiogram    carotid dopplers. (8/11) no significant stenosis  . HTN (hypertension)   . Hx of echocardiogram 2015   Echo (08/2013):  EF 60-65%, no RWMA, Gr 1 DD, AVR ok (mean 5 mmHg), mild MR, PASP 22 mmHg  . Hyperlipidemia   . Hypothyroidism   . Pulmonary embolism ([place]) 2010   She is no onger taking coumadin  . S/P aortic valve replacement with  bioprosthetic valve    ehco (8/11) with EF 55-60%, mild-mod MR, mild-mod TR, poorly visualized bioprosthetic aortic valve but no significant regurgitation and gradient not significanly elevated.    . S/P thoracic aortic aneurysm repair 1/10   also with bioprosthetic aortic valve prelacement [facility]). in 10/10 she has a descending thoraic aorta anuerysm and abdominal reapir [facility]). Vascular urgeon is Dr. Lunette Stands.   Marland Kitchen SVT (supraventricular tachycardia) ([place])    HOLTER MONITOR AFTER SURGERY     PAST SURGICAL HISTORY: Past Surgical History:  Procedure Laterality Date  . ANOMALOUS PULMONARY VENOUS RETURN REPAIR    . BACK SURGERY  1970   . EYE SURGERY     AS BABY- RT EYE  POOR SIGHT  . LUMBAR LAMINECTOMY/DECOMPRESSION MICRODISCECTOMY  05/31/2011   Procedure: LUMBAR LAMINECTOMY/DECOMPRESSION MICRODISCECTOMY;  Surgeon: Elaina Hoops, MD;  Location: [facility];  Service: Neurosurgery;  Laterality: Bilateral;  Bilateral Lumbar three-four decompressionj lumbar laminectomy   . THORACIC AORTIC ANEURYSM REPAIR  1/10   VALVE ALSO DONE  . THORACOABDOMINAL AORTIC ANEURYSM REPAIR  01/28/09   repair  . TONSILLECTOMY      MEDICATIONS AT HOME: Prior to Admission medications   Medication Sig Start Date End Date Taking? Authorizing Provider  atorvastatin (LIPITOR) 20 MG tablet TAKE 1 TABLET BY MOUTH EVERY DAY 09/20/14  Yes Larey Dresser, MD  Calcium Carbonate-Vitamin D (CALCIUM-VITAMIN D) 600-200 MG-UNIT CAPS Take 1 tablet by mouth daily.   Yes Historical Provider, MD  donepezil (ARICEPT) 10 MG tablet TAKE 1 TABLET (10 MG TOTAL) BY MOUTH AT BEDTIME. 10/19/14  Yes Star Age, MD  doxycycline (VIBRAMYCIN) 100 MG capsule Take 100 mg by mouth 2 (two) times daily. 11/26/15-12/05/15 11/26/15  Yes Historical Provider, MD  ergocalciferol (VITAMIN D2) 50000 UNITS capsule Take 50,000 Units by mouth every 30 (thirty) days.   Yes Historical Provider, MD  Hydrocodone-Acetaminophen 5-300 MG TABS Take 1 tablet by  mouth every 6 (six) hours as needed (pain). Take 1 tablet by mouth every 6 hours as needed  09/13/15  Yes Historical Provider, MD  L-Methylfolate-B12-B6-B2 (CEREFOLIN PO) Take 1 tablet by mouth daily.   Yes Historical Provider, MD  levothyroxine (SYNTHROID, LEVOTHROID) 112 MCG tablet Take 112 mcg by mouth daily. 12/24/14  Yes Historical Provider, MD  memantine (NAMENDA) 10 MG tablet Take 10 mg by mouth daily.  09/01/15  Yes Historical Provider, MD  metoprolol tartrate (LOPRESSOR) 25 MG tablet Take 0.5 tablets (12.5 mg total) by mouth 2 (two) times daily. 02/12/15  Yes Florencia Reasons, MD  mometasone-formoterol (DULERA) 100-5 MCG/ACT AERO INHALE 2 PUFFS INTO THE LUNGS 2 TIMES DAILY. RINSE MOUTH AFTER EACH USE 09/17/15  Yes Tammy S [place], NP  Multiple Vitamin (MULITIVITAMIN WITH MINERALS) TABS Take 1 tablet by mouth daily.   Yes Historical Provider, MD  omeprazole (PRILOSEC) 20 MG capsule Take 20 mg by mouth daily.   Yes Historical Provider, MD  PROAIR HFA 108 (90 BASE) MCG/ACT inhaler Inhale 2 puffs into the lungs every 6 (six) hours as needed. Patient taking differently: Inhale 2  puffs into the lungs every 6 (six) hours as needed for wheezing or shortness of breath.  05/08/14  Yes Elsie Stain, MD  Rivaroxaban (XARELTO) 15 MG TABS tablet Take 1 tablet (15 mg total) by mouth 2 (two) times daily with a meal. Patient taking differently: Take 15 mg by mouth daily with supper.  02/12/15 11/28/15 Yes Florencia Reasons, MD  saccharomyces boulardii (FLORASTOR) 250 MG capsule Take 1 capsule (250 mg total) by mouth 2 (two) times daily. 03/29/14  Yes Robbie Lis, MD  sertraline (ZOLOFT) 50 MG tablet Take 50 mg by mouth at bedtime.  07/19/13  Yes Historical Provider, MD  ibuprofen (ADVIL,MOTRIN) 200 MG tablet Take 2 tablets (400 mg total) by mouth daily as needed for moderate pain. 02/12/15   Florencia Reasons, MD  rivaroxaban (XARELTO) 20 MG TABS tablet Take 1 tablet (20 mg total) by mouth daily with supper. Patient not taking: Reported on  09/25/2015 03/03/15   Florencia Reasons, MD  thiamine 100 MG tablet Take 1 tablet (100 mg total) by mouth daily. Patient not taking: Reported on 11/28/2015 02/12/15   Florencia Reasons, MD    FAMILY HISTORY: Family History  Problem Relation Age of Onset  . Brain cancer Maternal Grandmother   . Coronary artery disease      no premature CAD    SOCIAL HISTORY:  reports that she quit smoking about 11 years ago. Her smoking use included Cigarettes. She has a 20.00 pack-year smoking history. She has never used smokeless tobacco. She reports that she does not drink alcohol or use drugs.  PHYSICAL EXAM: Blood pressure 152/68, pulse 75, temperature 99.2 F (37.3 C), temperature source Oral, resp. rate 23, SpO2 91 %.  General appearance :Awake, alert-But slow to respond, not in any distress. Speech Clear. Not toxic Looking HEENT: Atraumatic and Normocephalic, pupils equally reactive to light and accomodation Neck: supple-She is easily able to touch her chin to her chest. Neck is somewhat tender on movement, no JVD. No cervical lymphadenopathy.  Chest:Good air entry bilaterally, no added sounds  CVS: S1 S2 regular, no murmurs.  Abdomen: Bowel sounds present, Non tender and not distended with no gaurding, rigidity or rebound. Extremities: B/L Lower Ext shows no edema, both legs are warm to touch  Right hand: Mild erythema in the dorsum aspect of the right hand-mostly in the medial aspect. Tender first, second and third metacarpophalangeal joints. Erythema from the dorsum of the hand extends into the volar aspect of the wrist. Tender wrist joint. (See pictures below) Neurology:  Non focal Psychiatric: Normal judgment and insight. Alert and oriented x 3. Normal mood. Skin:No Rash Wounds:N/A           LABS ON ADMISSION:  I have personally reviewed following labs and imaging studies  CBC:  Recent Labs Lab 11/28/15 1223  WBC 13.9*  NEUTROABS 9.6*  HGB 12.4  HCT 38.1  MCV 87.0  PLT 233    Basic  Metabolic Panel:  Recent Labs Lab 11/28/15 1223  NA 137  K 4.2  CL 105  CO2 23  GLUCOSE 99  BUN 22*  CREATININE 1.17*  CALCIUM 9.3    GFR: CrCl cannot be calculated (Unknown ideal weight.).  Liver Function Tests:  Recent Labs Lab 11/28/15 1223  AST 37  ALT 22  ALKPHOS 94  BILITOT 0.5  PROT 7.7  ALBUMIN 3.5   No results for input(s): LIPASE, AMYLASE in the last 168 hours. No results for input(s): AMMONIA in the last 168 hours.  Coagulation Profile: No results for input(s): INR, PROTIME in the last 168 hours.  Cardiac Enzymes: No results for input(s): CKTOTAL, CKMB, CKMBINDEX, TROPONINI in the last 168 hours.  BNP (last 3 results) No results for input(s): PROBNP in the last 8760 hours.  HbA1C: No results for input(s): HGBA1C in the last 72 hours.  CBG: No results for input(s): GLUCAP in the last 168 hours.  Lipid Profile: No results for input(s): CHOL, HDL, LDLCALC, TRIG, CHOLHDL, LDLDIRECT in the last 72 hours.  Thyroid Function Tests: No results for input(s): TSH, T4TOTAL, FREET4, T3FREE, THYROIDAB in the last 72 hours.  Anemia Panel: No results for input(s): VITAMINB12, FOLATE, FERRITIN, TIBC, IRON, RETICCTPCT in the last 72 hours.  Urine analysis:    Component Value Date/Time   COLORURINE YELLOW 11/28/2015 [place] 11/28/2015 1252   LABSPEC 1.027 11/28/2015 1252   PHURINE 5.5 11/28/2015 1252   GLUCOSEU NEGATIVE 11/28/2015 1252   HGBUR NEGATIVE 11/28/2015 1252   BILIRUBINUR NEGATIVE 11/28/2015 1252   KETONESUR NEGATIVE 11/28/2015 1252   PROTEINUR 30 (A) 11/28/2015 1252   UROBILINOGEN 0.2 02/04/2015 2036   NITRITE NEGATIVE 11/28/2015 1252   LEUKOCYTESUR NEGATIVE 11/28/2015 1252    Sepsis Labs: Lactic Acid, Venous    Component Value Date/Time   LATICACIDVEN 0.88 11/28/2015 1235     Microbiology: No results found for this or any previous visit (from the past 240 hour(s)).    RADIOLOGIC STUDIES ON ADMISSION: Dg Wrist  Complete Right  Result Date: 11/28/2015 CLINICAL DATA:  82 year old female with right arm cellulitis being treated but continued pain. Initial encounter. EXAM: RIGHT WRIST - COMPLETE 3+ VIEW COMPARISON:  None. FINDINGS: Bone mineralization is within normal limits for age. Distal radius and ulna intact. Chondrocalcinosis at the right wrist. Carpal bone alignment within normal limits. Carpal bones and metacarpals appear intact. No osteolysis identified. No subcutaneous gas. IMPRESSION: 1.  No acute osseous abnormality identified.  About the right wrist. 2. Chondrocalcinosis which can be seen in the setting of calcium pyrophosphate deposition disease. Electronically Signed   By: Genevie Ann M.D.   On: 11/28/2015 12:28   Ct Head Wo Contrast  Result Date: 11/28/2015 CLINICAL DATA:  Stiff neck.  Dementia. EXAM: CT HEAD WITHOUT CONTRAST CT CERVICAL SPINE WITHOUT CONTRAST TECHNIQUE: Multidetector CT imaging of the head and cervical spine was performed following the standard protocol without intravenous contrast. Multiplanar CT image reconstructions of the cervical spine were also generated. COMPARISON:  CT scan of head of February 10, 2015. FINDINGS: CT HEAD FINDINGS Bony calvarium appears intact. Mild diffuse cortical atrophy is noted. Mild chronic ischemic white matter disease is noted. No mass effect or midline shift is noted. Ventricular size is within normal limits. There is no evidence of mass lesion, hemorrhage or acute infarction. CT CERVICAL SPINE FINDINGS No fracture or spondylolisthesis is noted. Severe degenerative disc disease is noted at C4-5, C5-6 and C6-7. Visualized lung apices are unremarkable. IMPRESSION: Mild diffuse cortical atrophy. Mild chronic ischemic white matter disease. No acute intracranial abnormality seen. Severe multilevel degenerative disc disease. No acute abnormality seen in the cervical spine. Electronically Signed   By: Marijo Conception, M.D.   On: 11/28/2015 12:26   Ct Cervical Spine Wo  Contrast  Result Date: 11/28/2015 CLINICAL DATA:  Stiff neck.  Dementia. EXAM: CT HEAD WITHOUT CONTRAST CT CERVICAL SPINE WITHOUT CONTRAST TECHNIQUE: Multidetector CT imaging of the head and cervical spine was performed following the standard protocol without intravenous contrast. Multiplanar CT image reconstructions of  the cervical spine were also generated. COMPARISON:  CT scan of head of February 10, 2015. FINDINGS: CT HEAD FINDINGS Bony calvarium appears intact. Mild diffuse cortical atrophy is noted. Mild chronic ischemic white matter disease is noted. No mass effect or midline shift is noted. Ventricular size is within normal limits. There is no evidence of mass lesion, hemorrhage or acute infarction. CT CERVICAL SPINE FINDINGS No fracture or spondylolisthesis is noted. Severe degenerative disc disease is noted at C4-5, C5-6 and C6-7. Visualized lung apices are unremarkable. IMPRESSION: Mild diffuse cortical atrophy. Mild chronic ischemic white matter disease. No acute intracranial abnormality seen. Severe multilevel degenerative disc disease. No acute abnormality seen in the cervical spine. Electronically Signed   By: Marijo Conception, M.D.   On: 11/28/2015 12:26    I have personally reviewed images of  CT head  EKG:  Pending  ASSESSMENT AND PLAN: Present on Admission: . Cellulitis of right hand: Would continue with vancomycin that has been started in the emergency room, keep extremity elevated. Although I doubt gout, will check a uric acid level. I do not think she has a deep infection at present, but will follow very closely. Follow blood cultures (history of AVR)  . Neck pain: Suspect torticollis. Neck is supple, some mild tenderness on movement. Highly doubt meningitis. Supportive care. Follow  . Mild acute encephalopathy: Has underlying dementia-during my exam she is mostly awake and alert-she is slow to respond however. She is also very hard of hearing. CT head negative for any acute  abnormalities. Suspect mild encephalopathy from right hand cellulitis. Should improve with improvement in her overall condition. For now follow.  . History of Deep vein thrombosis (DVT) of right lower extremity: Continue Xarelto.  Marland Kitchen COPD (chronic obstructive pulmonary disease): Without any signs of exacerbation-has only a few scattered rhonchi-continue with bronchodilators. Follow   . CKD (chronic kidney disease), stage II: Follow electrolytes closely.  Marland Kitchen HTN (hypertension): Continue metoprolol  . Hypothyroidism: Continue levothyroxine  . History of aortic valve replacement with a bioprosthetic valve in 2010  Further plan will depend as patient's clinical course evolves and further radiologic and laboratory data become available. Patient will be monitored closely.  Above noted plan was discussed with patient/daughters face to face at bedside, they were in agreement.   CONSULTS: None  DVT Prophylaxis: Xarelto  Code Status: Full Code  Disposition Plan:  Discharge back home vs SNF in 2-3 days, depending on clinical course  Admission status: Inpatient  medical floor   Total time spent  55 minutes.Greater than 50% of this time was spent in counseling, explanation of diagnosis, planning of further management, and coordination of care.  [facility] Pager [phone]  If 7PM-7AM, please contact night-coverage [email or web address] Password [email or web address] 11/28/2015, 2:06 PM

## 2015-11-28 NOTE — Progress Notes (Signed)
Patient from ED to rm 1328. Alert, oriented x3. Pleasant.Denies pain. R hand is swollen,red and warm to touch, elevated on pillows,cold compress applied. Patient able to get out of bed with one to two assist r/t weakness. Had a good BM this afternoon. IVF infusing well through [place]. Daughters at bedside. Will continue to monitor.

## 2015-11-28 NOTE — ED Notes (Signed)
Pt's sats dropped to 89%, pt placed on 2L.

## 2015-11-28 NOTE — Plan of Care (Signed)
Problem: Education: Goal: Verbalization of understanding the information provided will improve Outcome: Progressing Family was provided information re: cellulitis, questions answered appropriately.

## 2015-11-28 NOTE — ED Provider Notes (Signed)
[facility] DEPT Provider Note   CSN: [id number] Arrival date & time: 11/28/15  1051  First Provider Contact:  First MD Initiated Contact with Patient 11/28/15 1110        History   Chief Complaint Chief Complaint  Patient presents with  . Recurrent Skin Infections    DX THIS WEEK BY PCP/ RT ARM/ ANTIBIOTIC  . Neck Pain  . Dementia    HPI PHYLICIA MERCY is a 82 y.o. female.  Pt woke up Tuesday AM with her Right arm and hand hurting.  She saw her doctor on Wednesday.  She was diagnosed with an infection and started on abx.  She also had a doppler study as an outpatient that was negative.  She has been having pain in her neck as well that increases with movement.   No symptoms started after she woke up one morning with a crick in her neck. Family noticed that she was sleeping in an odd position. No measured fevers at home. She went to the doctors office today for follow up.  She sent her to the hospital.   The history is provided by the patient and a relative.  Neck Pain   This is a new problem. The current episode started more than 2 days ago. The problem has been gradually worsening. Associated symptoms include confusion. Pertinent negatives include no headaches.    Past Medical History:  Diagnosis Date  . Arthritis   . Blind right eye   . Cataract    RT  . CKD (chronic kidney disease)   . COPD (chronic obstructive pulmonary disease) ([place])   . Emphysema    followed by Dr. Joya Gaskins  . GERD (gastroesophageal reflux disease)   . History of Doppler echocardiogram    carotid dopplers. (8/11) no significant stenosis  . HTN (hypertension)   . Hx of echocardiogram 2015   Echo (08/2013):  EF 60-65%, no RWMA, Gr 1 DD, AVR ok (mean 5 mmHg), mild MR, PASP 22 mmHg  . Hyperlipidemia   . Hypothyroidism   . Pulmonary embolism ([place]) 2010   She is no onger taking coumadin  . S/P aortic valve replacement with bioprosthetic valve    ehco (8/11) with EF 55-60%, mild-mod MR, mild-mod  TR, poorly visualized bioprosthetic aortic valve but no significant regurgitation and gradient not significanly elevated.    . S/P thoracic aortic aneurysm repair 1/10   also with bioprosthetic aortic valve prelacement [facility]). in 10/10 she has a descending thoraic aorta anuerysm and abdominal reapir [facility]). Vascular urgeon is Dr. Lunette Stands.   Marland Kitchen SVT (supraventricular tachycardia) ([place])    HOLTER MONITOR AFTER SURGERY     Patient Active Problem List   Diagnosis Date Noted  . COPD (chronic obstructive pulmonary disease) ([place]) 09/25/2015  . Leukocytosis 02/11/2015  . Hypokalemia 02/11/2015  . Hypomagnesemia 02/11/2015  . Deep vein thrombosis (DVT) of right lower extremity ([place]) 02/08/2015  . CKD (chronic kidney disease), stage II 02/05/2015  . Anemia of chronic renal failure, stage 2 (mild) 02/05/2015  . Thrombocytopenia ([place]) 02/05/2015  . Left lower lobe pneumonia 02/05/2015  . Sepsis ([place])   . History of Clostridium difficile colitis 05/09/2014    Past Surgical History:  Procedure Laterality Date  . ANOMALOUS PULMONARY VENOUS RETURN REPAIR    . BACK SURGERY  1970   . EYE SURGERY     AS BABY- RT EYE  POOR SIGHT  . LUMBAR LAMINECTOMY/DECOMPRESSION MICRODISCECTOMY  05/31/2011   Procedure: LUMBAR LAMINECTOMY/DECOMPRESSION MICRODISCECTOMY;  Surgeon:  Elaina Hoops, MD;  Location: [facility];  Service: Neurosurgery;  Laterality: Bilateral;  Bilateral Lumbar three-four decompressionj lumbar laminectomy   . THORACIC AORTIC ANEURYSM REPAIR  1/10   VALVE ALSO DONE  . THORACOABDOMINAL AORTIC ANEURYSM REPAIR  01/28/09   repair  . TONSILLECTOMY      OB History    No data available       Home Medications    Prior to Admission medications   Medication Sig Start Date End Date Taking? Authorizing Provider  atorvastatin (LIPITOR) 20 MG tablet TAKE 1 TABLET BY MOUTH EVERY DAY 09/20/14  Yes Larey Dresser, MD  Calcium Carbonate-Vitamin D (CALCIUM-VITAMIN D) 600-200 MG-UNIT CAPS  Take 1 tablet by mouth daily.   Yes Historical Provider, MD  donepezil (ARICEPT) 10 MG tablet TAKE 1 TABLET (10 MG TOTAL) BY MOUTH AT BEDTIME. 10/19/14  Yes Star Age, MD  doxycycline (VIBRAMYCIN) 100 MG capsule Take 100 mg by mouth 2 (two) times daily. 11/26/15-12/05/15 11/26/15  Yes Historical Provider, MD  ergocalciferol (VITAMIN D2) 50000 UNITS capsule Take 50,000 Units by mouth every 30 (thirty) days.   Yes Historical Provider, MD  Hydrocodone-Acetaminophen 5-300 MG TABS Take 1 tablet by mouth every 6 (six) hours as needed (pain). Take 1 tablet by mouth every 6 hours as needed  09/13/15  Yes Historical Provider, MD  L-Methylfolate-B12-B6-B2 (CEREFOLIN PO) Take 1 tablet by mouth daily.   Yes Historical Provider, MD  levothyroxine (SYNTHROID, LEVOTHROID) 112 MCG tablet Take 112 mcg by mouth daily. 12/24/14  Yes Historical Provider, MD  memantine (NAMENDA) 10 MG tablet Take 10 mg by mouth daily.  09/01/15  Yes Historical Provider, MD  metoprolol tartrate (LOPRESSOR) 25 MG tablet Take 0.5 tablets (12.5 mg total) by mouth 2 (two) times daily. 02/12/15  Yes Florencia Reasons, MD  mometasone-formoterol (DULERA) 100-5 MCG/ACT AERO INHALE 2 PUFFS INTO THE LUNGS 2 TIMES DAILY. RINSE MOUTH AFTER EACH USE 09/17/15  Yes Tammy S Parrett, NP  Multiple Vitamin (MULITIVITAMIN WITH MINERALS) TABS Take 1 tablet by mouth daily.   Yes Historical Provider, MD  omeprazole (PRILOSEC) 20 MG capsule Take 20 mg by mouth daily.   Yes Historical Provider, MD  PROAIR HFA 108 (90 BASE) MCG/ACT inhaler Inhale 2 puffs into the lungs every 6 (six) hours as needed. Patient taking differently: Inhale 2 puffs into the lungs every 6 (six) hours as needed for wheezing or shortness of breath.  05/08/14  Yes Elsie Stain, MD  Rivaroxaban (XARELTO) 15 MG TABS tablet Take 1 tablet (15 mg total) by mouth 2 (two) times daily with a meal. Patient taking differently: Take 15 mg by mouth daily with supper.  02/12/15 11/28/15 Yes Florencia Reasons, MD  saccharomyces  boulardii (FLORASTOR) 250 MG capsule Take 1 capsule (250 mg total) by mouth 2 (two) times daily. 03/29/14  Yes Robbie Lis, MD  sertraline (ZOLOFT) 50 MG tablet Take 50 mg by mouth at bedtime.  07/19/13  Yes Historical Provider, MD  ibuprofen (ADVIL,MOTRIN) 200 MG tablet Take 2 tablets (400 mg total) by mouth daily as needed for moderate pain. 02/12/15   Florencia Reasons, MD  rivaroxaban (XARELTO) 20 MG TABS tablet Take 1 tablet (20 mg total) by mouth daily with supper. Patient not taking: Reported on 09/25/2015 03/03/15   Florencia Reasons, MD  thiamine 100 MG tablet Take 1 tablet (100 mg total) by mouth daily. Patient not taking: Reported on 11/28/2015 02/12/15   Florencia Reasons, MD    Family History Family History  Problem Relation Age of Onset  . Brain cancer Maternal Grandmother   . Coronary artery disease      no premature CAD    Social History Social History  Substance Use Topics  . Smoking status: Former Smoker    Packs/day: 1.00    Years: 20.00    Types: Cigarettes    Quit date: 04/26/2004  . Smokeless tobacco: Never Used     Comment: quit in 2006  . Alcohol use No     Allergies   Codeine and Penicillins   Review of Systems Review of Systems  Constitutional: Negative for unexpected weight change.  Musculoskeletal: Positive for neck pain.  Neurological: Negative for tremors, weakness and headaches.  Psychiatric/Behavioral: Positive for confusion.  All other systems reviewed and are negative.    Physical Exam Updated Vital Signs BP 163/71   Pulse 65   Temp 99.2 F (37.3 C) (Oral)   Resp 18   SpO2 93% Comment: wheezing/baseline per daughter /COPD  Physical Exam  Constitutional: No distress.  Elderly, frail  HENT:  Head: Normocephalic and atraumatic.  Right Ear: External ear normal.  Left Ear: External ear normal.  Eyes: Conjunctivae are normal. Right eye exhibits no discharge. Left eye exhibits no discharge. No scleral icterus.  Neck: Neck supple. No tracheal deviation present.    Tenderness to palpation in the paraspinal region, the patient does have pain with range of motion  Cardiovascular: Normal rate, regular rhythm and intact distal pulses.   Pulmonary/Chest: Effort normal and breath sounds normal. No stridor. No respiratory distress. She has no wheezes. She has no rales.  Abdominal: Soft. Bowel sounds are normal. She exhibits no distension. There is no tenderness. There is no rebound and no guarding.  Musculoskeletal: She exhibits tenderness. She exhibits no edema.  Patient has erythema of her right wrist with lymphangitic streaking up to the elbow, tenderness palpation and pain with range of motion  Neurological: She is alert. She has normal strength. No cranial nerve deficit (no facial droop, extraocular movements intact, no slurred speech) or sensory deficit. She exhibits normal muscle tone. She displays no seizure activity. Coordination normal.  Skin: Skin is warm and dry. No rash noted.  Psychiatric: She has a normal mood and affect.  Nursing note and vitals reviewed.    ED Treatments / Results  Labs (all labs ordered are listed, but only abnormal results are displayed) Labs Reviewed  COMPREHENSIVE METABOLIC PANEL - Abnormal; Notable for the following:       Result Value   BUN 22 (*)    Creatinine, Ser 1.17 (*)    GFR calc non Af Amer 43 (*)    GFR calc Af Amer 50 (*)    All other components within normal limits  CBC WITH DIFFERENTIAL/PLATELET - Abnormal; Notable for the following:    WBC 13.9 (*)    RDW 16.3 (*)    Neutro Abs 9.6 (*)    Monocytes Absolute 2.1 (*)    All other components within normal limits  CULTURE, BLOOD (ROUTINE X 2)  CULTURE, BLOOD (ROUTINE X 2)  URINE CULTURE  URINALYSIS, ROUTINE W REFLEX MICROSCOPIC (NOT AT [facility])  I-STAT CG4 LACTIC ACID, ED    EKG  EKG Interpretation None       Radiology Dg Wrist Complete Right  Result Date: 11/28/2015 CLINICAL DATA:  82 year old female with right arm cellulitis being treated  but continued pain. Initial encounter. EXAM: RIGHT WRIST - COMPLETE 3+ VIEW COMPARISON:  None. FINDINGS: Bone mineralization is  within normal limits for age. Distal radius and ulna intact. Chondrocalcinosis at the right wrist. Carpal bone alignment within normal limits. Carpal bones and metacarpals appear intact. No osteolysis identified. No subcutaneous gas. IMPRESSION: 1.  No acute osseous abnormality identified.  About the right wrist. 2. Chondrocalcinosis which can be seen in the setting of calcium pyrophosphate deposition disease. Electronically Signed   By: Genevie Ann M.D.   On: 11/28/2015 12:28   Ct Head Wo Contrast  Result Date: 11/28/2015 CLINICAL DATA:  Stiff neck.  Dementia. EXAM: CT HEAD WITHOUT CONTRAST CT CERVICAL SPINE WITHOUT CONTRAST TECHNIQUE: Multidetector CT imaging of the head and cervical spine was performed following the standard protocol without intravenous contrast. Multiplanar CT image reconstructions of the cervical spine were also generated. COMPARISON:  CT scan of head of February 10, 2015. FINDINGS: CT HEAD FINDINGS Bony calvarium appears intact. Mild diffuse cortical atrophy is noted. Mild chronic ischemic white matter disease is noted. No mass effect or midline shift is noted. Ventricular size is within normal limits. There is no evidence of mass lesion, hemorrhage or acute infarction. CT CERVICAL SPINE FINDINGS No fracture or spondylolisthesis is noted. Severe degenerative disc disease is noted at C4-5, C5-6 and C6-7. Visualized lung apices are unremarkable. IMPRESSION: Mild diffuse cortical atrophy. Mild chronic ischemic white matter disease. No acute intracranial abnormality seen. Severe multilevel degenerative disc disease. No acute abnormality seen in the cervical spine. Electronically Signed   By: Marijo Conception, M.D.   On: 11/28/2015 12:26   Ct Cervical Spine Wo Contrast  Result Date: 11/28/2015 CLINICAL DATA:  Stiff neck.  Dementia. EXAM: CT HEAD WITHOUT CONTRAST CT  CERVICAL SPINE WITHOUT CONTRAST TECHNIQUE: Multidetector CT imaging of the head and cervical spine was performed following the standard protocol without intravenous contrast. Multiplanar CT image reconstructions of the cervical spine were also generated. COMPARISON:  CT scan of head of February 10, 2015. FINDINGS: CT HEAD FINDINGS Bony calvarium appears intact. Mild diffuse cortical atrophy is noted. Mild chronic ischemic white matter disease is noted. No mass effect or midline shift is noted. Ventricular size is within normal limits. There is no evidence of mass lesion, hemorrhage or acute infarction. CT CERVICAL SPINE FINDINGS No fracture or spondylolisthesis is noted. Severe degenerative disc disease is noted at C4-5, C5-6 and C6-7. Visualized lung apices are unremarkable. IMPRESSION: Mild diffuse cortical atrophy. Mild chronic ischemic white matter disease. No acute intracranial abnormality seen. Severe multilevel degenerative disc disease. No acute abnormality seen in the cervical spine. Electronically Signed   By: Marijo Conception, M.D.   On: 11/28/2015 12:26    Procedures Procedures (including critical care time)  Medications Ordered in ED Medications  0.9 %  sodium chloride infusion ( Intravenous New Bag/Given 11/28/15 1230)  vancomycin (VANCOCIN) IVPB 1000 mg/200 mL premix (not administered)     Initial Impression / Assessment and Plan / ED Course  I have reviewed the triage vital signs and the nursing notes.  Pertinent labs & imaging results that were available during my care of the patient were reviewed by me and considered in my medical decision making (see chart for details).  Clinical Course  Comment By Time  Initial evaluation is suggestive of a cellulitis of her right upper extremity. I suspect her neck pain may be more related to torticollis and less likely meningitis. We'll plan on laboratory tests, imaging tests.   Dorie Rank, MD 08/04 1156    CT scan of the head is unremarkable.  CT of the  cervical spine shows degenerative disease. Labs show an elevated white blood cell count. I suspect this along with muscle spasms that cause for neck pain. I doubt meningitis.  Patient has a cellulitis on exam. She is allergic to penicillin. I'll start her on vancomycin. I will consult the medical service for admission considering her failure to respond to outpatient treatment (oral doxy).    Final Clinical Impressions(s) / ED Diagnoses   Final diagnoses:  Skin infection  Cellulitis of right upper extremity  Degenerative disc disease, cervical     Dorie Rank, MD 11/28/15 1311

## 2015-11-28 NOTE — ED Triage Notes (Signed)
Per [facility] Pt seen at PCP today. TX for cellulitis to RT arm since Tuesday with antibiotic however today symptoms have not improved. Today pt c/o of stiff neck. Pt is not able to move neck from side to side. Pt HX of dementia, HOH, blind in rt eye. Daughter in route. Good CMS present to RT arm. Denies fever N/V/D

## 2015-11-28 NOTE — Progress Notes (Signed)
Pharmacy Antibiotic Follow-up Note  Sarah Johnson is a 82 y.o. year-old female admitted on 11/28/2015.  The patient is currently on day 1 of Vancomycin for RUE cellulitis.  Hx of PCN allergy, patient cannot recall reaction, has received Cephalosporins previously.  Assessment/Plan: Vancomycin 1gm x1, then 500mg  IV every 12 hours.  Goal trough 10-15 mcg/mL.  Temp (24hrs), Avg:99.2 F (37.3 C), Min:99.2 F (37.3 C), Max:99.2 F (37.3 C)   Recent Labs Lab 11/28/15 1223  WBC 13.9*    Recent Labs Lab 11/28/15 1223  CREATININE 1.17*   CrCl cannot be calculated (Unknown ideal weight.).    Allergies  Allergen Reactions  . Codeine Other (See Comments)    Pt. States it has been too long to remember the reaction   . Penicillins Other (See Comments)    Pt. Does not recall the reaction.  Has patient had a PCN reaction causing immediate rash, facial/tongue/throat swelling, SOB or lightheadedness with hypotension:  Has patient had a PCN reaction causing severe rash involving mucus membranes or skin necrosis: Has patient had a PCN reaction that required hospitalization  Has patient had a PCN reaction occurring within the last 10 years:  If all of the above answers are "NO", then may proceed with Cephalosporin use.    Antimicrobials this admission: 8/4 Vancomycin  >>   Levels/dose changes this admission:  Microbiology results: 8/4 BCx: collected 8/4 UCx: collected   Thank you for allowing pharmacy to be a part of this patient's care.  Minda Ditto PharmD Pager [phone] 11/28/2015, 1:40 PM

## 2015-11-29 DIAGNOSIS — L03113 Cellulitis of right upper limb: Principal | ICD-10-CM

## 2015-11-29 LAB — BASIC METABOLIC PANEL
Anion gap: 7 (ref 5–15)
BUN: 18 mg/dL (ref 6–20)
CALCIUM: 8.7 mg/dL — AB (ref 8.9–10.3)
CO2: 23 mmol/L (ref 22–32)
CREATININE: 1 mg/dL (ref 0.44–1.00)
Chloride: 107 mmol/L (ref 101–111)
GFR calc Af Amer: >60 mL/min (ref >60)
GFR, EST NON AFRICAN AMERICAN: 52 mL/min — AB (ref >60)
Glucose, Bld: 95 mg/dL (ref 65–99)
Potassium: 4.2 mmol/L (ref 3.5–5.1)
SODIUM: 137 mmol/L (ref 135–145)

## 2015-11-29 LAB — URINE CULTURE: CULTURE: NO GROWTH

## 2015-11-29 LAB — CBC
HCT: 34.9 % — ABNORMAL LOW (ref 36.0–46.0)
Hemoglobin: 11.2 g/dL — ABNORMAL LOW (ref 12.0–15.0)
MCH: 28 pg (ref 26.0–34.0)
MCHC: 32.1 g/dL (ref 30.0–36.0)
MCV: 87.3 fL (ref 78.0–100.0)
PLATELETS: 242 10*3/uL (ref 150–400)
RBC: 4 MIL/uL (ref 3.87–5.11)
RDW: 16.4 % — AB (ref 11.5–15.5)
WBC: 11.9 10*3/uL — AB (ref 4.0–10.5)

## 2015-11-29 MED ORDER — IPRATROPIUM-ALBUTEROL 0.5-2.5 (3) MG/3ML IN SOLN: 3.0000 mL | Freq: Four times a day (QID) | RESPIRATORY_TRACT | Status: DC | PRN

## 2015-11-29 MED ORDER — VANCOMYCIN HCL 500 MG IV SOLR
500.0000 mg | INTRAVENOUS | Status: DC
  Administered 2015-11-30 – 2015-12-01 (×2): 500 mg via INTRAVENOUS
  Filled 2015-11-29 (×2): qty 500

## 2015-11-29 NOTE — Progress Notes (Signed)
Pharmacy Antibiotic Follow-up Note  Sarah Johnson is a 82 y.o. year-old female admitted on 11/28/2015.  The patient is currently on day 2 of Vancomycin for RUE cellulitis.  Hx of PCN allergy, patient cannot recall reaction, has received Cephalosporins previously.  Assessment/Plan: Due to CrCl of 36 and goal trough of 10-15, will change vancomycin to 500mg  IV q24  Temp (24hrs), Avg:98.7 F (37.1 C), Min:97.9 F (36.6 C), Max:99.5 F (37.5 C)   Recent Labs Lab 11/28/15 1223 11/29/15 0407  WBC 13.9* 11.9*     Recent Labs Lab 11/28/15 1223 11/29/15 0407  CREATININE 1.17* 1.00   Estimated Creatinine Clearance: 35.8 mL/min (by C-G formula based on SCr of 1 mg/dL).    Allergies  Allergen Reactions  . Codeine Other (See Comments)    Pt. States it has been too long to remember the reaction   . Penicillins Other (See Comments)    Pt. Does not recall the reaction.  Has patient had a PCN reaction causing immediate rash, facial/tongue/throat swelling, SOB or lightheadedness with hypotension:  Has patient had a PCN reaction causing severe rash involving mucus membranes or skin necrosis: Has patient had a PCN reaction that required hospitalization  Has patient had a PCN reaction occurring within the last 10 years:  If all of the above answers are "NO", then may proceed with Cephalosporin use.    Antimicrobials this admission: 8/4 Vancomycin  >>   Levels/dose changes this admission:  Microbiology results: 8/4 BCx: collected 8/4 UCx: collected    Thank you for allowing pharmacy to be a part of this patient's care.  Adrian Saran, PharmD, BCPS Pager [phone] 11/29/2015 9:06 AM

## 2015-11-29 NOTE — Progress Notes (Addendum)
Patient ID: Sarah Johnson, female   DOB: [birth year], 82 y.o.   MRN: [id number]    PROGRESS NOTE    AQUETZALLI DUB  [id number] DOB: 1932-11-01 DOA: 11/28/2015  PCP: Jani Gravel, MD   Brief Narrative:  82 y.o. female with medical history significant of dementia, hypertension, COPD, history of venous thromboembolism on anticoagulation with Xarelto, history of bioprosthetic aortic valve replacement/aortic aneurysm repair in 2010 brought to the ED by her family for evaluation of right hand erythema and swelling along with worsening confusion.  Assessment & Plan:   Present on Admission: . Cellulitis of right hand: with evidence of chondrocalcinosis: Improving and less tender to palpation. Continue vancomycin day #2 and narrow down in AM if continues to improve. WBC trending down and no fevers in the past 24 hours.   . Neck pain: Neck is supple, some mild tenderness on movement. Highly doubt meningitis. Improving, PT pending.   . Mild acute encephalopathy: Has underlying dementia, she is slow to respond. She is also very hard of hearing. CT head negative for any acute abnormalities but noted chronic vessel ischemic changes   . History of Deep vein thrombosis (DVT) of right lower extremity: Continue Xarelto.  Marland Kitchen COPD (chronic obstructive pulmonary disease): Without any signs of exacerbation, continue with bronchodilators.  . CKD (chronic kidney disease), stage II: BMP in AM  . HTN (hypertension), essential: Continue metoprolol  . Hypothyroidism: Continue levothyroxine  . History of aortic valve replacement with a bioprosthetic valve in 2010  DVT prophylaxis: Xarelto  Code Status: Full  Family Communication: Patient at bedside  Disposition Plan: Home in 1-2 days   Consultants:   None  Procedures:   None  Antimicrobials:   Vancomycin 8/4 -->  Subjective: No events overnight.   Objective: Vitals:   11/29/15 0552 11/29/15 0923 11/29/15 0927 11/29/15 1426  BP:  (!) 156/81   131/66  Pulse: 65   64  Resp: 20   18  Temp: 97.9 F (36.6 C)   98.3 F (36.8 C)  TempSrc: Oral   Oral  SpO2: 100% 96% 96% 97%  Weight:      Height:        Intake/Output Summary (Last 24 hours) at 11/29/15 2039 Last data filed at 11/29/15 1427  Gross per 24 hour  Intake              480 ml  Output              500 ml  Net              -20 ml   Filed Weights   11/28/15 1412 11/28/15 1506  Weight: 57.4 kg (126 lb 9.6 oz) 58.2 kg (128 lb 3.2 oz)    Examination:  General exam: Appears calm and comfortable  Respiratory system: Respiratory effort normal. Cardiovascular system: RRR. No JVD, murmurs, rubs, gallops or clicks. No pedal edema. Gastrointestinal system: Abdomen is nondistended, soft and nontender. No organomegaly or masses felt. Normal bowel sounds heard. Central nervous system: Alert and oriented. No focal neurological deficits. Extremities: B/L Lower Ext shows no edema, both legs are warm to touch. Right hand: Mild erythema in the dorsum aspect of the right hand-mostly in the medial aspect. Tender first, second and third MCP joints. Erythema from the dorsum of the hand extends into the volar aspect of the wrist. Tender wrist joint.   Data Reviewed: I have personally reviewed following labs and imaging studies  CBC:  Recent Labs Lab 11/28/15  1223 11/29/15 0407  WBC 13.9* 11.9*  NEUTROABS 9.6*  --   HGB 12.4 11.2*  HCT 38.1 34.9*  MCV 87.0 87.3  PLT 233 [id number]   Basic Metabolic Panel:  Recent Labs Lab 11/28/15 1223 11/29/15 0407  NA 137 137  K 4.2 4.2  CL 105 107  CO2 23 23  GLUCOSE 99 95  BUN 22* 18  CREATININE 1.17* 1.00  CALCIUM 9.3 8.7*   Liver Function Tests:  Recent Labs Lab 11/28/15 1223  AST 37  ALT 22  ALKPHOS 94  BILITOT 0.5  PROT 7.7  ALBUMIN 3.5   Urine analysis:    Component Value Date/Time   COLORURINE YELLOW 11/28/2015 [place] 11/28/2015 1252   LABSPEC 1.027 11/28/2015 1252   PHURINE 5.5  11/28/2015 1252   GLUCOSEU NEGATIVE 11/28/2015 1252   HGBUR NEGATIVE 11/28/2015 1252   BILIRUBINUR NEGATIVE 11/28/2015 1252   KETONESUR NEGATIVE 11/28/2015 1252   PROTEINUR 30 (A) 11/28/2015 1252   UROBILINOGEN 0.2 02/04/2015 2036   NITRITE NEGATIVE 11/28/2015 1252   LEUKOCYTESUR NEGATIVE 11/28/2015 1252   Recent Results (from the past 240 hour(s))  Blood culture (routine x 2)     Status: None (Preliminary result)   Collection Time: 11/28/15 11:59 AM  Result Value Ref Range Status   Specimen Description BLOOD LEFT ANTECUBITAL  Final   Special Requests BOTTLES DRAWN AEROBIC AND ANAEROBIC 5ML  Final   Culture   Final    NO GROWTH 1 DAY Performed at [facility]    Report Status PENDING  Incomplete  Blood culture (routine x 2)     Status: None (Preliminary result)   Collection Time: 11/28/15 12:23 PM  Result Value Ref Range Status   Specimen Description BLOOD RIGHT ANTECUBITAL  Final   Special Requests BOTTLES DRAWN AEROBIC AND ANAEROBIC 5ML  Final   Culture   Final    NO GROWTH 1 DAY Performed at [facility]    Report Status PENDING  Incomplete  Urine culture     Status: None   Collection Time: 11/28/15 12:52 PM  Result Value Ref Range Status   Specimen Description URINE, CATHETERIZED  Final   Special Requests NONE  Final   Culture NO GROWTH Performed at [facility]   Final   Report Status 11/29/2015 FINAL  Final      Radiology Studies: Dg Wrist Complete Right  Result Date: 11/28/2015 CLINICAL DATA:  82 year old female with right arm cellulitis being treated but continued pain. Initial encounter. EXAM: RIGHT WRIST - COMPLETE 3+ VIEW COMPARISON:  None. FINDINGS: Bone mineralization is within normal limits for age. Distal radius and ulna intact. Chondrocalcinosis at the right wrist. Carpal bone alignment within normal limits. Carpal bones and metacarpals appear intact. No osteolysis identified. No subcutaneous gas. IMPRESSION: 1.  No acute osseous  abnormality identified.  About the right wrist. 2. Chondrocalcinosis which can be seen in the setting of calcium pyrophosphate deposition disease. Electronically Signed   By: Genevie Ann M.D.   On: 11/28/2015 12:28   Ct Head Wo Contrast  Result Date: 11/28/2015 CLINICAL DATA:  Stiff neck.  Dementia. EXAM: CT HEAD WITHOUT CONTRAST CT CERVICAL SPINE WITHOUT CONTRAST TECHNIQUE: Multidetector CT imaging of the head and cervical spine was performed following the standard protocol without intravenous contrast. Multiplanar CT image reconstructions of the cervical spine were also generated. COMPARISON:  CT scan of head of February 10, 2015. FINDINGS: CT HEAD FINDINGS Bony calvarium appears intact. Mild diffuse cortical  atrophy is noted. Mild chronic ischemic white matter disease is noted. No mass effect or midline shift is noted. Ventricular size is within normal limits. There is no evidence of mass lesion, hemorrhage or acute infarction. CT CERVICAL SPINE FINDINGS No fracture or spondylolisthesis is noted. Severe degenerative disc disease is noted at C4-5, C5-6 and C6-7. Visualized lung apices are unremarkable. IMPRESSION: Mild diffuse cortical atrophy. Mild chronic ischemic white matter disease. No acute intracranial abnormality seen. Severe multilevel degenerative disc disease. No acute abnormality seen in the cervical spine. Electronically Signed   By: Marijo Conception, M.D.   On: 11/28/2015 12:26   Ct Cervical Spine Wo Contrast  Result Date: 11/28/2015 CLINICAL DATA:  Stiff neck.  Dementia. EXAM: CT HEAD WITHOUT CONTRAST CT CERVICAL SPINE WITHOUT CONTRAST TECHNIQUE: Multidetector CT imaging of the head and cervical spine was performed following the standard protocol without intravenous contrast. Multiplanar CT image reconstructions of the cervical spine were also generated. COMPARISON:  CT scan of head of February 10, 2015. FINDINGS: CT HEAD FINDINGS Bony calvarium appears intact. Mild diffuse cortical atrophy is noted.  Mild chronic ischemic white matter disease is noted. No mass effect or midline shift is noted. Ventricular size is within normal limits. There is no evidence of mass lesion, hemorrhage or acute infarction. CT CERVICAL SPINE FINDINGS No fracture or spondylolisthesis is noted. Severe degenerative disc disease is noted at C4-5, C5-6 and C6-7. Visualized lung apices are unremarkable. IMPRESSION: Mild diffuse cortical atrophy. Mild chronic ischemic white matter disease. No acute intracranial abnormality seen. Severe multilevel degenerative disc disease. No acute abnormality seen in the cervical spine. Electronically Signed   By: Marijo Conception, M.D.   On: 11/28/2015 12:26    Scheduled Meds: . atorvastatin  20 mg Oral Daily  . calcium-vitamin D  1 tablet Oral Daily  . donepezil  5 mg Oral QHS  . levothyroxine  112 mcg Oral QAC breakfast  . memantine  10 mg Oral Daily  . metoprolol tartrate  12.5 mg Oral BID  . mometasone-formoterol  2 puff Inhalation BID  . multivitamin with minerals  1 tablet Oral Daily  . pantoprazole  40 mg Oral Daily  . Rivaroxaban  15 mg Oral Q supper  . saccharomyces boulardii  250 mg Oral BID  . sertraline  50 mg Oral QHS  . [START ON 11/30/2015] vancomycin  500 mg Intravenous Q24H  . [START ON 12/14/2015] Vitamin D (Ergocalciferol)  50,000 Units Oral Q30 days   Continuous Infusions:    LOS: 1 day    Time spent: 20 minutes    Faye Ramsay, MD [facility] Pager [phone]  If 7PM-7AM, please contact night-coverage [email or web address] Password [facility] 11/29/2015, 8:39 PM

## 2015-11-29 NOTE — Evaluation (Signed)
Physical Therapy Evaluation Patient Details Name: Sarah Johnson MRN: [id number] DOB: 01-Nov-1932 Today's Date: 11/29/2015   History of Present Illness   82 yo female admitted with worsening cellulits of right hand and neck pain Past medical history significant of dementia, hypertension, COPD, history of venous thromboembolism on anticoagulation with Xarelto, history of bioprosthetic aortic valve replacement/aortic aneurysm repair in 2010    Clinical Impression  Mrs. Sarah Johnson appears to be deconditioned from current illness and is limited today by neck pain. (daughter questioning cause of neck pain) Her son is coming from [place] to stay with her when she goes home and she will benefit from HHPT and use of O2 at dicharge during her recovery. She agreed that she would use it while son is here.     Follow Up Recommendations Home health PT    Equipment Recommendations  Other (comment) (home O2)    Recommendations for Other Services OT consult     Precautions / Restrictions Precautions Precaution Comments: per daughter, pt gets out of breath easily and she will not use the home O2 that was recommended for her  Restrictions Weight Bearing Restrictions: No      Mobility  Bed Mobility Overal bed mobility: Needs Assistance Bed Mobility: Supine to Sit     Supine to sit: Min guard        Transfers Overall transfer level: Needs assistance Equipment used: Rolling walker (2 wheeled)             General transfer comment: verbal cues to push up with left hand, not able to push up with right hand   Ambulation/Gait Ambulation/Gait assistance: Min assist Ambulation Distance (Feet): 50 Feet Assistive device: Rolling walker (2 wheeled) Gait Pattern/deviations: Decreased step length - right;Decreased step length - left;Shuffle Gait velocity: slow   General Gait Details: on 2L O2, sats at 94%, when walking on RA O2sats at 87%, returned to 94% when O2 reapplied   Stairs             Wheelchair Mobility    Modified Rankin (Stroke Patients Only)       Balance Overall balance assessment: Modified Independent (uses RW in standing and for gait)                                           Pertinent Vitals/Pain Pain Assessment: 0-10 Pain Score: 6  Pain Location: neck  Pain Intervention(s): RN gave pain meds during session    Home Living   Living Arrangements: Alone (dughter lives right down the street,  son will stay 24/7) Available Help at Discharge: Available 24 hours/day Type of Home: House Home Access: Stairs to enter Entrance Stairs-Rails: Psychiatric nurse of Steps: 2 Home Layout: One level Home Equipment: Walker - 2 wheels      Prior Function Level of Independence: Independent         Comments: daughter reports pt walked with a "shuffle-shuffle" and did limted driving at home.      Hand Dominance        Extremity/Trunk Assessment               Lower Extremity Assessment: Generalized weakness         Communication   Communication: No difficulties  Cognition Arousal/Alertness: Awake/alert  General Comments General comments (skin integrity, edema, etc.): darkenting of skin of right hand. pt with tremor and slow to move, has decreased neck rotation due to pain ,so she holds her head in midline     Exercises        Assessment/Plan    PT Assessment Patient needs continued PT services  PT Diagnosis Difficulty walking;Abnormality of gait;Generalized weakness   PT Problem List Decreased strength;Decreased range of motion;Decreased activity tolerance;Decreased balance;Decreased mobility;Decreased skin integrity;Pain  PT Treatment Interventions DME instruction;Gait training;Functional mobility training;Therapeutic activities;Therapeutic exercise;Balance training;Patient/family education   PT Goals (Current goals can be found in the Care Plan section) Acute  Rehab PT Goals Patient Stated Goal: to go home with son  PT Goal Formulation: With patient/family Time For Goal Achievement: 12/13/15 Potential to Achieve Goals: Good    Frequency Min 3X/week   Barriers to discharge        Co-evaluation               End of Session Equipment Utilized During Treatment: Gait belt;Oxygen Activity Tolerance: Patient limited by fatigue;Patient limited by pain (on 2L O2, sats at 94%, when walking on RA O2sats at 87%, ret) Patient left: in chair;with family/visitor present Nurse Communication: Mobility status         Time: 1000-1028 PT Time Calculation (min) (ACUTE ONLY): 28 min   Charges:   PT Evaluation $PT Eval Low Complexity: 1 Procedure     PT G Codes:       Teresa K. Owens Shark, PT  [place] Levo 11/29/2015, 10:44 AM

## 2015-11-30 NOTE — Progress Notes (Signed)
Patient ID: RUTHER SINGLE, female   DOB: 07-23-1932, 82 y.o.   MRN: [id number]    PROGRESS NOTE    CANDAS ESSER  [id number] DOB: 08-Apr-1933 DOA: 11/28/2015  PCP: Jani Gravel, MD   Brief Narrative:  82 y.o. female with medical history significant of dementia, hypertension, COPD, history of venous thromboembolism on anticoagulation with Xarelto, history of bioprosthetic aortic valve replacement/aortic aneurysm repair in 2010 brought to the ED by her family for evaluation of right hand erythema and swelling along with worsening confusion.  Assessment & Plan:   Present on Admission: . Cellulitis of right hand: with evidence of chondrocalcinosis: Improving and less tender to palpation. Continue vancomycin day #3 and narrow down in AM if continues to improve. WBC trending down and no fevers in the past 24 hours.   . Neck pain: Neck is supple, some mild tenderness on movement. Highly doubt meningitis. Improving, PT pending.   . Mild acute encephalopathy: Has underlying dementia-during my exam she is mostly awake and alert-she is slow to respond however. She is also very hard of hearing. CT head negative for any acute abnormalities. At baseline.   Marland Kitchen History of Deep vein thrombosis (DVT) of right lower extremity: Continue Xarelto.  Marland Kitchen COPD (chronic obstructive pulmonary disease): Without any signs of exacerbation-has only a few scattered rhonchi-continue with bronchodilators.  . CKD (chronic kidney disease), stage II: Cr is WNL  . HTN (hypertension): Continue metoprolol  . Hypothyroidism: Continue levothyroxine  . History of aortic valve replacement with a bioprosthetic valve in 2010  DVT prophylaxis: Xarelto  Code Status: Full  Family Communication: Patient at bedside  Disposition Plan: Home in 1-2 days   Consultants:   None  Procedures:   None  Antimicrobials:   Vancomycin 8/4 -->  Subjective: No events overnight.   Objective: Vitals:   11/29/15 2156  11/30/15 0608 11/30/15 1006 11/30/15 1513  BP: (!) 134/58 (!) 156/71  (!) 151/69  Pulse: 66 67  79  Resp: 20 20  18   Temp: 98.8 F (37.1 C) 98.6 F (37 C)  98.2 F (36.8 C)  TempSrc: Oral Oral  Oral  SpO2: 97% 99% 99% 96%  Weight:  58.8 kg (129 lb 10.1 oz)    Height:        Intake/Output Summary (Last 24 hours) at 11/30/15 1847 Last data filed at 11/30/15 1500  Gross per 24 hour  Intake              480 ml  Output                0 ml  Net              480 ml   Filed Weights   11/28/15 1412 11/28/15 1506 11/30/15 0608  Weight: 57.4 kg (126 lb 9.6 oz) 58.2 kg (128 lb 3.2 oz) 58.8 kg (129 lb 10.1 oz)    Examination:  General exam: Appears calm and comfortable  Respiratory system: Respiratory effort normal. Cardiovascular system: RRR. No JVD, murmurs, rubs, gallops or clicks. No pedal edema. Gastrointestinal system: Abdomen is nondistended, soft and nontender. No organomegaly or masses felt. Normal bowel sounds heard. Central nervous system: Alert and oriented. No focal neurological deficits. Extremities: B/L Lower Ext shows no edema, both legs are warm to touch. Right hand: Mild erythema in the dorsum aspect of the right hand-mostly in the medial aspect. Tender first, second and third MCP joints. Erythema from the dorsum of the hand extends into the volar  aspect of the wrist. Tender wrist joint.   Data Reviewed: I have personally reviewed following labs and imaging studies  CBC:  Recent Labs Lab 11/28/15 1223 11/29/15 0407  WBC 13.9* 11.9*  NEUTROABS 9.6*  --   HGB 12.4 11.2*  HCT 38.1 34.9*  MCV 87.0 87.3  PLT 233 [id number]   Basic Metabolic Panel:  Recent Labs Lab 11/28/15 1223 11/29/15 0407  NA 137 137  K 4.2 4.2  CL 105 107  CO2 23 23  GLUCOSE 99 95  BUN 22* 18  CREATININE 1.17* 1.00  CALCIUM 9.3 8.7*   Liver Function Tests:  Recent Labs Lab 11/28/15 1223  AST 37  ALT 22  ALKPHOS 94  BILITOT 0.5  PROT 7.7  ALBUMIN 3.5   Urine analysis:      Component Value Date/Time   COLORURINE YELLOW 11/28/2015 [place] 11/28/2015 1252   LABSPEC 1.027 11/28/2015 1252   PHURINE 5.5 11/28/2015 [place] 11/28/2015 1252   [place] 11/28/2015 1252   [place] 11/28/2015 1252   [place] 11/28/2015 1252   PROTEINUR 30 (A) 11/28/2015 1252   UROBILINOGEN 0.2 02/04/2015 2036   NITRITE NEGATIVE 11/28/2015 1252   LEUKOCYTESUR NEGATIVE 11/28/2015 1252   Recent Results (from the past 240 hour(s))  Blood culture (routine x 2)     Status: None (Preliminary result)   Collection Time: 11/28/15 11:59 AM  Result Value Ref Range Status   Specimen Description BLOOD LEFT ANTECUBITAL  Final   Special Requests BOTTLES DRAWN AEROBIC AND ANAEROBIC 5ML  Final   Culture   Final    NO GROWTH 2 DAYS Performed at [facility]    Report Status PENDING  Incomplete  Blood culture (routine x 2)     Status: None (Preliminary result)   Collection Time: 11/28/15 12:23 PM  Result Value Ref Range Status   Specimen Description BLOOD RIGHT ANTECUBITAL  Final   Special Requests BOTTLES DRAWN AEROBIC AND ANAEROBIC 5ML  Final   Culture   Final    NO GROWTH 2 DAYS Performed at [facility]    Report Status PENDING  Incomplete  Urine culture     Status: None   Collection Time: 11/28/15 12:52 PM  Result Value Ref Range Status   Specimen Description URINE, CATHETERIZED  Final   Special Requests NONE  Final   Culture NO GROWTH Performed at [facility]   Final   Report Status 11/29/2015 FINAL  Final      Radiology Studies: No results found.  Scheduled Meds: . atorvastatin  20 mg Oral Daily  . calcium-vitamin D  1 tablet Oral Daily  . donepezil  5 mg Oral QHS  . levothyroxine  112 mcg Oral QAC breakfast  . memantine  10 mg Oral Daily  . metoprolol tartrate  12.5 mg Oral BID  . mometasone-formoterol  2 puff Inhalation BID  . multivitamin with minerals  1 tablet Oral Daily  .  pantoprazole  40 mg Oral Daily  . Rivaroxaban  15 mg Oral Q supper  . saccharomyces boulardii  250 mg Oral BID  . sertraline  50 mg Oral QHS  . vancomycin  500 mg Intravenous Q24H  . [START ON 12/14/2015] Vitamin D (Ergocalciferol)  50,000 Units Oral Q30 days   Continuous Infusions:    LOS: 2 days    Time spent: 20 minutes    Faye Ramsay, MD [facility] Pager [phone]  If 7PM-7AM, please contact  night-coverage [email or web address] Password [facility] 11/30/2015, 6:47 PM

## 2015-12-01 ENCOUNTER — Inpatient Hospital Stay (HOSPITAL_COMMUNITY): Payer: Medicare Other

## 2015-12-01 LAB — CBC
HEMATOCRIT: 33.9 % — AB (ref 36.0–46.0)
Hemoglobin: 11 g/dL — ABNORMAL LOW (ref 12.0–15.0)
MCH: 28.1 pg (ref 26.0–34.0)
MCHC: 32.4 g/dL (ref 30.0–36.0)
MCV: 86.7 fL (ref 78.0–100.0)
Platelets: 266 10*3/uL (ref 150–400)
RBC: 3.91 MIL/uL (ref 3.87–5.11)
RDW: 15.9 % — AB (ref 11.5–15.5)
WBC: 12.6 10*3/uL — AB (ref 4.0–10.5)

## 2015-12-01 LAB — BASIC METABOLIC PANEL
ANION GAP: 7 (ref 5–15)
BUN: 15 mg/dL (ref 6–20)
CALCIUM: 9.5 mg/dL (ref 8.9–10.3)
CO2: 26 mmol/L (ref 22–32)
Chloride: 103 mmol/L (ref 101–111)
Creatinine, Ser: 0.99 mg/dL (ref 0.44–1.00)
GFR calc Af Amer: >60 mL/min (ref >60)
GFR calc non Af Amer: 52 mL/min — ABNORMAL LOW (ref >60)
GLUCOSE: 93 mg/dL (ref 65–99)
Potassium: 4.1 mmol/L (ref 3.5–5.1)
Sodium: 136 mmol/L (ref 135–145)

## 2015-12-01 MED ORDER — DOXYCYCLINE HYCLATE 100 MG PO CAPS: 100.0000 mg | ORAL_CAPSULE | Freq: Two times a day (BID) | ORAL | 0 refills | Status: DC

## 2015-12-01 NOTE — Progress Notes (Addendum)
SATURATION QUALIFICATIONS: (This note is used to comply with regulatory documentation for home oxygen)  Patient Saturations on Room Air at Rest = 88%  Patient Saturations on Room Air while Ambulating = 86%  Patient Saturations on 2 Liters of oxygen while Ambulating = 92%  Please briefly explain why patient needs home oxygen: to maintain appropriate SaO2 levels.   Blondell Reveal Kistler PT 12/01/2015  [phone]

## 2015-12-01 NOTE — Progress Notes (Signed)
Physical Therapy Treatment Patient Details Name: Sarah Johnson MRN: [id number] DOB: [birth year] Today's Date: 12/01/2015    History of Present Illness  82 yo female admitted with worsening cellulits of right hand and neck pain Past medical history significant of dementia, hypertension, COPD, history of venous thromboembolism on anticoagulation with Xarelto, history of bioprosthetic aortic valve replacement/aortic aneurysm repair in 2010      PT Comments    Pt ambulated 90' with RW, SaO2 86% on RA walking, 92% on 2L O2. Pt adamantly refuses to use O2 at home, her son stated she's refused it in the past. When questioned about what she doesn't like about O2, she stated, "everything". Encouraged pt to consider use of home O2.    Follow Up Recommendations  Home health PT     Equipment Recommendations  Other (comment) (home O2, pt and family state pt will refuse to use home O2 (she's refused it in past as well) and refuses to use RW at home)    Recommendations for Other Services OT consult     Precautions / Restrictions Precautions Precautions: Fall;Other (comment) (HOH) Precaution Comments: per family, pt gets out of breath easily and she will not use the home O2 that was recommended for her, family reports h/o falls (pt denies falls) Restrictions Weight Bearing Restrictions: No    Mobility  Bed Mobility Overal bed mobility: Needs Assistance Bed Mobility: Supine to Sit     Supine to sit: Supervision;HOB elevated     General bed mobility comments: increased time and effort, VCs for technique, HOB up 30*  Transfers Overall transfer level: Needs assistance Equipment used: Rolling walker (2 wheeled) Transfers: Sit to/from Stand Sit to Stand: Min assist         General transfer comment: verbal cues to push up with left hand, not able to push up with right hand, min A to rise  Ambulation/Gait Ambulation/Gait assistance: Min guard Ambulation Distance (Feet): 90  Feet Assistive device: Rolling walker (2 wheeled) Gait Pattern/deviations: Step-through pattern;Decreased stride length Gait velocity: slow   General Gait Details: SaO2 86% on RA walking, 92% on 2L O2 [place] walking, 92% on RA at rest, 4/10 LLE (knee to foot) pain with walking, no pain at rest, pt declined pain medication   Stairs            Wheelchair Mobility    Modified Rankin (Stroke Patients Only)       Balance Overall balance assessment: History of Falls;Needs assistance   Sitting balance-Leahy Scale: Good       Standing balance-Leahy Scale: Fair                      Cognition Arousal/Alertness: Awake/alert Behavior During Therapy: WFL for tasks assessed/performed Overall Cognitive Status: Within Functional Limits for tasks assessed                      Exercises General Exercises - Upper Extremity Shoulder Flexion: AROM;Both;Seated;5 reps General Exercises - Lower Extremity Ankle Circles/Pumps: AROM;Both;10 reps;Supine Quad Sets: AROM;Both;5 reps;Supine    General Comments        Pertinent Vitals/Pain Pain Score: 4  Pain Location: LLE when walking Pain Intervention(s): Limited activity within patient's tolerance;Monitored during session    Home Living                      Prior Function            PT Goals (current goals  can now be found in the care plan section) Acute Rehab PT Goals Patient Stated Goal: to go home with son  PT Goal Formulation: With patient/family Time For Goal Achievement: 12/13/15 Potential to Achieve Goals: Good Progress towards PT goals: Progressing toward goals    Frequency  Min 3X/week    PT Plan Current plan remains appropriate    Co-evaluation             End of Session Equipment Utilized During Treatment: Gait belt;Oxygen Activity Tolerance: Patient tolerated treatment well (on 2L O2, sats at 94%, when walking on RA O2sats at 87%, ret) Patient left: in chair;with family/visitor  present     Time: 0786-7544 PT Time Calculation (min) (ACUTE ONLY): 28 min  Charges:  $Gait Training: 8-22 mins $Therapeutic Exercise: 8-22 mins                    G Codes:      Philomena Doheny 12/01/2015, 11:21 AM [phone]

## 2015-12-01 NOTE — Progress Notes (Signed)
SATURATION QUALIFICATIONS: (This note is used to comply with regulatory documentation for home oxygen)  Patient Saturations on Room Air at Rest = 88%  Patient Saturations on Room Air while Ambulating = 86%  Patient Saturations on 2 Liters of oxygen while Ambulating = 96%  Please briefly explain why patient needs home oxygen:

## 2015-12-01 NOTE — Care Management Note (Signed)
Case Management Note  Patient Details  Name: REESA GIRDLEY MRN: [id number] Date of Birth: [birth year]  Subjective/Objective:     82 yo admitted with Cellulitis of Right hand. Hx of COPD               Action/Plan: From home alone. Pt daughter lives near by and son is supportive as well. This CM spoke with pt at bedside about home health services as that was PT recommendations.  Pt refused [place] services and also home 02. Pt states she will not use 02 at home.  She told nursing the same about home health and 02.  No other DC needs communicated.  Expected Discharge Date:   (unknown)               Expected Discharge Plan:  Home/Self Care  In-House Referral:     Discharge planning Services  CM Consult  Post Acute Care Choice:  Home Health Choice offered to:  Patient  DME Arranged:    DME Agency:     HH Arranged:    [place] Agency:     Status of Service:  Completed, signed off  If discussed at [employer] of Stay Meetings, dates discussed:    Additional CommentsLynnell Catalan, RN 12/01/2015, 1:05 PM  [phone]

## 2015-12-01 NOTE — Discharge Summary (Signed)
Physician Discharge Summary  Sarah Johnson [id number] DOB: [birth year] DOA: 11/28/2015  PCP: Jani Gravel, MD  Admit date: 11/28/2015 Discharge date: 12/01/2015  Recommendations for Outpatient Follow-up:  1. Pt will need to follow up with PCP in 1-2 weeks post discharge 2. Please obtain BMP to evaluate electrolytes and kidney function 3. Please also check CBC to evaluate Hg and Hct levels  Discharge Diagnoses:  Active Problems:   Deep vein thrombosis (DVT) of right lower extremity (HCC)   CKD (chronic kidney disease), stage II  Discharge Condition: Stable  Diet recommendation: Heart healthy diet discussed in details   Brief Narrative:  82 y.o.femalewith medical history significant of dementia, hypertension, COPD, history of venous thromboembolism on anticoagulation with Xarelto, history of bioprosthetic aortic valve replacement/aortic aneurysm repair in 2010 brought to the ED by her family for evaluation of right hand erythema and swelling along with worsening confusion.  Assessment & Plan:   Present on Admission: .Cellulitis of right hand: with evidence of chondrocalcinosis: Resolved, no longer tender and no erythema, pt was on vancomycin and successfully transitioned to oral doxycycline to complete therapy.   .Neck pain: Neck is supple, pain resolved, pt participate with PT and OT, will need HH PT/OT.   .Mild acute encephalopathy: Resolved   .History of Deep vein thrombosis (DVT) of right lower extremity:Continue Xarelto.  Marland KitchenCOPD (chronic obstructive pulmonary disease):Without any signs of exacerbation-has only a few scattered rhonchi-continue with bronchodilators if needed. Pt was supposed ot be on oxygen and family says this is known but she has refused oxygen in the past and they are asking to respect her wishes.   .CKD (chronic kidney disease), stage II: Cr is WNL  .HTN (hypertension): Continue metoprolol  .Hypothyroidism:Continue  levothyroxine  .History of aortic valve replacement with a bioprosthetic valve in 2010  DVT prophylaxis: Xarelto  Code Status: Full  Family Communication: Patient and family at bedside  Disposition Plan: Home   Consultants:   None  Procedures:   None  Antimicrobials:   Vancomycin 8/4 -->  Procedures/Studies: Dg Chest 2 View  Result Date: 12/01/2015 CLINICAL DATA:  COPD and shortness of Breath EXAM: CHEST  2 VIEW COMPARISON:  04/09/2015 FINDINGS: Cardiac shadow is stable. Moderate-sized hiatal hernia is noted. Changes of prior vertebral augmentation are now seen. Postsurgical changes are noted. The lungs demonstrate mild interstitial changes without focal infiltrate. No acute bony abnormality is seen. IMPRESSION: Hiatal hernia. Chronic interstitial changes. Interval vertebral augmentation. Electronically Signed   By: Inez Catalina M.D.   On: 12/01/2015 11:08   Dg Wrist Complete Right  Result Date: 11/28/2015 CLINICAL DATA:  82 year old female with right arm cellulitis being treated but continued pain. Initial encounter. EXAM: RIGHT WRIST - COMPLETE 3+ VIEW COMPARISON:  None. FINDINGS: Bone mineralization is within normal limits for age. Distal radius and ulna intact. Chondrocalcinosis at the right wrist. Carpal bone alignment within normal limits. Carpal bones and metacarpals appear intact. No osteolysis identified. No subcutaneous gas. IMPRESSION: 1.  No acute osseous abnormality identified.  About the right wrist. 2. Chondrocalcinosis which can be seen in the setting of calcium pyrophosphate deposition disease. Electronically Signed   By: Genevie Ann M.D.   On: 11/28/2015 12:28   Ct Head Wo Contrast  Result Date: 11/28/2015 CLINICAL DATA:  Stiff neck.  Dementia. EXAM: CT HEAD WITHOUT CONTRAST CT CERVICAL SPINE WITHOUT CONTRAST TECHNIQUE: Multidetector CT imaging of the head and cervical spine was performed following the standard protocol without intravenous contrast. Multiplanar CT  image reconstructions of  the cervical spine were also generated. COMPARISON:  CT scan of head of February 10, 2015. FINDINGS: CT HEAD FINDINGS Bony calvarium appears intact. Mild diffuse cortical atrophy is noted. Mild chronic ischemic white matter disease is noted. No mass effect or midline shift is noted. Ventricular size is within normal limits. There is no evidence of mass lesion, hemorrhage or acute infarction. CT CERVICAL SPINE FINDINGS No fracture or spondylolisthesis is noted. Severe degenerative disc disease is noted at C4-5, C5-6 and C6-7. Visualized lung apices are unremarkable. IMPRESSION: Mild diffuse cortical atrophy. Mild chronic ischemic white matter disease. No acute intracranial abnormality seen. Severe multilevel degenerative disc disease. No acute abnormality seen in the cervical spine. Electronically Signed   By: Marijo Conception, M.D.   On: 11/28/2015 12:26   Ct Cervical Spine Wo Contrast  Result Date: 11/28/2015 CLINICAL DATA:  Stiff neck.  Dementia. EXAM: CT HEAD WITHOUT CONTRAST CT CERVICAL SPINE WITHOUT CONTRAST TECHNIQUE: Multidetector CT imaging of the head and cervical spine was performed following the standard protocol without intravenous contrast. Multiplanar CT image reconstructions of the cervical spine were also generated. COMPARISON:  CT scan of head of February 10, 2015. FINDINGS: CT HEAD FINDINGS Bony calvarium appears intact. Mild diffuse cortical atrophy is noted. Mild chronic ischemic white matter disease is noted. No mass effect or midline shift is noted. Ventricular size is within normal limits. There is no evidence of mass lesion, hemorrhage or acute infarction. CT CERVICAL SPINE FINDINGS No fracture or spondylolisthesis is noted. Severe degenerative disc disease is noted at C4-5, C5-6 and C6-7. Visualized lung apices are unremarkable. IMPRESSION: Mild diffuse cortical atrophy. Mild chronic ischemic white matter disease. No acute intracranial abnormality seen. Severe  multilevel degenerative disc disease. No acute abnormality seen in the cervical spine. Electronically Signed   By: Marijo Conception, M.D.   On: 11/28/2015 12:26    Discharge Exam: Vitals:   11/30/15 2100 12/01/15 0549  BP: (!) 144/62 (!) 153/67  Pulse: 79 68  Resp: 20 16  Temp: 99.3 F (37.4 C) 98.4 F (36.9 C)   Vitals:   11/30/15 2100 12/01/15 0549 12/01/15 0922 12/01/15 1115  BP: (!) 144/62 (!) 153/67    Pulse: 79 68    Resp: 20 16    Temp: 99.3 F (37.4 C) 98.4 F (36.9 C)    TempSrc: Oral Oral    SpO2: 99% 98% 95% (!) 86%  Weight:      Height:        General: Pt is alert, follows commands appropriately, not in acute distress Cardiovascular: Regular rate and rhythm, no rubs, no gallops Respiratory: diminished breaths sounds at bases  Abdominal: Soft, non tender, non distended, bowel sounds +, no guarding Extremities: right hand and forearm with no erythema and no tenderness to palpation  Discharge Instructions  Discharge Instructions    Diet - low sodium heart healthy    Complete by:  As directed   Increase activity slowly    Complete by:  As directed       Medication List    TAKE these medications   atorvastatin 20 MG tablet Commonly known as:  LIPITOR TAKE 1 TABLET BY MOUTH EVERY DAY   Calcium-Vitamin D 600-200 MG-UNIT Caps Take 1 tablet by mouth daily.   CEREFOLIN PO Take 1 tablet by mouth daily.   donepezil 10 MG tablet Commonly known as:  ARICEPT TAKE 1 TABLET (10 MG TOTAL) BY MOUTH AT BEDTIME.   doxycycline 100 MG capsule Commonly known as:  VIBRAMYCIN Take 1 capsule (100 mg total) by mouth 2 (two) times daily. 11/26/15-12/05/15   ergocalciferol 50000 units capsule Commonly known as:  VITAMIN D2 Take 50,000 Units by mouth every 30 (thirty) days.   Hydrocodone-Acetaminophen 5-300 MG Tabs Take 0.5-1 tablets by mouth 2 (two) times daily as needed (pain). Take 1 tablet by mouth every 6 hours as needed   ibuprofen 200 MG tablet Commonly known as:   ADVIL,MOTRIN Take 2 tablets (400 mg total) by mouth daily as needed for moderate pain.   levothyroxine 112 MCG tablet Commonly known as:  SYNTHROID, LEVOTHROID Take 112 mcg by mouth daily.   memantine 10 MG tablet Commonly known as:  NAMENDA Take 10 mg by mouth daily.   metoprolol tartrate 25 MG tablet Commonly known as:  LOPRESSOR Take 0.5 tablets (12.5 mg total) by mouth 2 (two) times daily.   mometasone-formoterol 100-5 MCG/ACT Aero Commonly known as:  DULERA INHALE 2 PUFFS INTO THE LUNGS 2 TIMES DAILY. RINSE MOUTH AFTER EACH USE   multivitamin with minerals Tabs tablet Take 1 tablet by mouth daily.   omeprazole 20 MG capsule Commonly known as:  PRILOSEC Take 20 mg by mouth daily.   PROAIR HFA 108 (90 Base) MCG/ACT inhaler Generic drug:  albuterol Inhale 2 puffs into the lungs every 6 (six) hours as needed. What changed:  reasons to take this   Rivaroxaban 15 MG Tabs tablet Commonly known as:  XARELTO Take 1 tablet (15 mg total) by mouth 2 (two) times daily with a meal. What changed:  when to take this   saccharomyces boulardii 250 MG capsule Commonly known as:  FLORASTOR Take 1 capsule (250 mg total) by mouth 2 (two) times daily.   sertraline 50 MG tablet Commonly known as:  ZOLOFT Take 50 mg by mouth at bedtime.   thiamine 100 MG tablet Take 1 tablet (100 mg total) by mouth daily.      Follow-up Information    Jani Gravel, MD .   Specialty:  Internal Medicine Contact information: [address] [place] [address] [phone]            The results of significant diagnostics from this hospitalization (including imaging, microbiology, ancillary and laboratory) are listed below for reference.     Microbiology: Recent Results (from the past 240 hour(s))  Blood culture (routine x 2)     Status: None (Preliminary result)   Collection Time: 11/28/15 11:59 AM  Result Value Ref Range Status   Specimen Description BLOOD LEFT  ANTECUBITAL  Final   Special Requests BOTTLES DRAWN AEROBIC AND ANAEROBIC 5ML  Final   Culture   Final    NO GROWTH 2 DAYS Performed at [facility]    Report Status PENDING  Incomplete  Blood culture (routine x 2)     Status: None (Preliminary result)   Collection Time: 11/28/15 12:23 PM  Result Value Ref Range Status   Specimen Description BLOOD RIGHT ANTECUBITAL  Final   Special Requests BOTTLES DRAWN AEROBIC AND ANAEROBIC 5ML  Final   Culture   Final    NO GROWTH 2 DAYS Performed at [facility]    Report Status PENDING  Incomplete  Urine culture     Status: None   Collection Time: 11/28/15 12:52 PM  Result Value Ref Range Status   Specimen Description URINE, CATHETERIZED  Final   Special Requests NONE  Final   Culture NO GROWTH Performed at [facility]   Final  Report Status 11/29/2015 FINAL  Final     Labs: Basic Metabolic Panel:  Recent Labs Lab 11/28/15 1223 11/29/15 0407 12/01/15 0351  NA 137 137 136  K 4.2 4.2 4.1  CL 105 107 103  CO2 23 23 26   GLUCOSE 99 95 93  BUN 22* 18 15  CREATININE 1.17* 1.00 0.99  CALCIUM 9.3 8.7* 9.5   Liver Function Tests:  Recent Labs Lab 11/28/15 1223  AST 37  ALT 22  ALKPHOS 94  BILITOT 0.5  PROT 7.7  ALBUMIN 3.5   CBC:  Recent Labs Lab 11/28/15 1223 11/29/15 0407 12/01/15 0351  WBC 13.9* 11.9* 12.6*  NEUTROABS 9.6*  --   --   HGB 12.4 11.2* 11.0*  HCT 38.1 34.9* 33.9*  MCV 87.0 87.3 86.7  PLT 233 242 266   SIGNED: Time coordinating discharge: 30 minutes  Faye Ramsay, MD  [facility] 12/01/2015, 12:34 PM Pager [phone]  If 7PM-7AM, please contact night-coverage [email or web address] Password [email or web address]

## 2015-12-01 NOTE — Discharge Instructions (Signed)
Calcium Pyrophosphate Deposition   Calcium pyrophosphate deposition (CPPD), which is also called pseudogout, is a type of arthritis that causes pain, swelling, and inflammation in a joint. The joint pain can be severe and may last for days. If it is not treated, the pain may last much longer. Attacks of CPPD may come and go. This condition usually affects one joint at a time. The joints that are affected most commonly are the knees, but this condition can also affect the wrists, elbows, shoulders, or ankles.  CPPD is similar to gout. Both conditions result from the buildup of crystals in the joint. However, CPPD is caused by a type of crystal that is different than the crystals that cause gout.  CAUSES  This condition is caused by the buildup of calcium pyrophosphate dihydrate crystals in the joint. The reason why this buildup occurs is not known. The condition may be passed down from parent to child (hereditary).  RISK FACTORS  This condition is more likely to develop in people who:  · Are over 60 years old.  · Have a family history of the condition.  · Have had joint replacement surgery.  · Have had a recent injury.  · Have certain medical conditions, such as hemophilia, ochronosis, amyloidosis, or hormonal disorders.  · Have low blood magnesium levels.  SYMPTOMS  Symptoms of this condition include:  · Pain in a joint. The pain may:    Be intense and constant.    Come on quickly.    Get worse with movement.    Last from several days to a few weeks.  · Redness, swelling, and warmth at the joint.  · Stiffness of the joint.  DIAGNOSIS  To diagnose this condition, your health care provider will use a needle to remove fluid from the joint. The fluid will be examined under a microscope to check for the crystals that cause CPPD. You may also have imaging tests, such as:  · X-rays.  · Ultrasound.  TREATMENT  There is no way to remove the crystals from the joint and no way to cure this condition. However, treatment can  relieve symptoms and improve joint function. Treatment may include:  · Nonsteroidal anti-inflammatory drugs (NSAIDs) to reduce inflammation and pain.  · Medicines to help prevent attacks.  · Injections of medicine (cortisone) into the joint to reduce pain and swelling.  · Physical therapy to improve joint function.  HOME CARE INSTRUCTIONS  · Take medicines only as directed by your health care provider.  · Rest the affected joints until your symptoms start to go away.  · Keep your affected joints raised (elevated) when possible. This will help to reduce swelling.  · If directed, apply ice to the affected area:    Put ice in a plastic bag.    Place a towel between your skin and the bag.    Leave the ice on for 20 minutes, 2-3 times per day.  · If the painful joint is in your leg, use crutches as directed by your health care provider.  · When your symptoms start to go away, begin to exercise regularly or do physical therapy. Talk with your health care provider or physical therapist about what types of exercise are safe for you. Low-impact exercise may be best. This includes walking, swimming, bicycling, and water aerobics.  · Maintain a healthy weight so your joints do not need to bear more weight than necessary.  SEEK MEDICAL CARE IF:  · You have an increase   in joint pain that is not relieved with medicine.  · Your joint becomes more red, swollen, or stiff.  · You have a fever.  · You have a skin rash.     This information is not intended to replace advice given to you by your health care provider. Make sure you discuss any questions you have with your health care provider.     Document Released: 01/03/2004 Document Revised: 08/27/2014 Document Reviewed: 03/20/2014  Elsevier Interactive Patient Education ©2016 [employer]

## 2015-12-01 NOTE — Progress Notes (Signed)
Patient discharged to home, all discharge medications and instructions reviewed and questions answered.  Patient to be assisted to vehicle by wheelchair.  

## 2015-12-01 NOTE — Evaluation (Signed)
Occupational Therapy Evaluation Patient Details Name: Sarah Johnson MRN: [id number] DOB: 1932/09/15 Today's Date: 12/01/2015    History of Present Illness  82 yo female admitted with worsening cellulits of right hand and neck pain Past medical history significant of dementia, hypertension, COPD, history of venous thromboembolism on anticoagulation with Xarelto, history of bioprosthetic aortic valve replacement/aortic aneurysm repair in 2010     Clinical Impression   Pt admitted with cellulitis R hand. Pt currently with functional limitations due to the deficits listed below (see OT Problem List). *Pt will benefit from skilled OT to increase their safety and independence with ADL and functional mobility for ADL to facilitate discharge to venue listed below.      Follow Up Recommendations  Home health OT;Supervision/Assistance - 24 hour;SNF          Precautions / Restrictions Precautions Precautions: Fall Precaution Comments: per family, pt gets out of breath easily and she will not use the home O2 that was recommended for her, family reports h/o falls (pt denies falls) Restrictions Weight Bearing Restrictions: No      Mobility Bed Mobility Overal bed mobility: Needs Assistance Bed Mobility: Supine to Sit     Supine to sit: Supervision;HOB elevated     General bed mobility comments: pt in chair  Transfers Overall transfer level: Needs assistance Equipment used: Rolling walker (2 wheeled) Transfers: Sit to/from [facility] Sit to Stand: Min assist         General transfer comment: verbal cues to push up with left hand, not able to push up with right hand, min A to rise    Balance Overall balance assessment: History of Falls;Needs assistance   Sitting balance-Leahy Scale: Good       Standing balance-Leahy Scale: Fair                              ADL Overall ADL's : Needs assistance/impaired                                                        Pertinent Vitals/Pain Pain Score: 3  Pain Location: LLE Pain Intervention(s): Monitored during session     Hand Dominance     Extremity/Trunk Assessment Upper Extremity Assessment Upper Extremity Assessment: RUE deficits/detail RUE Deficits / Details: R hand celluliitis- pt able to perform ARom with increased time and encouragement           Communication Communication Communication: No difficulties   Cognition Arousal/Alertness: Awake/alert Behavior During Therapy: WFL for tasks assessed/performed Overall Cognitive Status: Within Functional Limits for tasks assessed                        Exercises    Pt performed R UE Exercise with R hand in all planes of motion- finger opposition, fingers flexion/extension and wirst flexion extension. Pt did not complain of pain with any ROM        Home Living   Living Arrangements: Alone (dughter lives right down the street,  son will stay 24/7) Available Help at Discharge: Available 24 hours/day Type of Home: House Home Access: Stairs to enter [employer] of Steps: 2 Entrance Stairs-Rails: Right;Left Home Layout: One level  Home Equipment: [place] - 2 wheels          Prior Functioning/Environment Level of Independence: Independent        Comments: daughter reports pt walked with a "shuffle-shuffle" and did limted driving at home.     OT Diagnosis: Generalized weakness   OT Problem List: Decreased strength;Decreased activity tolerance   OT Treatment/Interventions: Self-care/ADL training    OT Goals(Current goals can be found in the care plan section) Acute Rehab OT Goals Patient Stated Goal: to go home with son  ADL Goals Pt Will Transfer to Toilet: with supervision;ambulating;regular height toilet Pt/caregiver will Perform Home Exercise Program: Left upper extremity;With written HEP provided;Independently  OT Frequency: Min 2X/week               End of Session Nurse Communication: Mobility status  Activity Tolerance: Patient tolerated treatment well Patient left: with call bell/phone within reach;in bed   Time: [id number] OT Time Calculation (min): 22 min Charges:  OT Evaluation $OT Eval Moderate Complexity: 1 Procedure G-Codes:    Shawntel Farnworth, Thereasa Parkin December 18, 2015, 1:40 PM

## 2015-12-03 LAB — CULTURE, BLOOD (ROUTINE X 2)
CULTURE: NO GROWTH
Culture: NO GROWTH

## 2015-12-10 DIAGNOSIS — Z09 Encounter for follow-up examination after completed treatment for conditions other than malignant neoplasm: Secondary | ICD-10-CM | POA: Diagnosis not present

## 2015-12-24 NOTE — Telephone Encounter (Signed)
This encounter was created in error - please disregard.

## 2016-01-14 DIAGNOSIS — F0390 Unspecified dementia without behavioral disturbance: Secondary | ICD-10-CM | POA: Diagnosis not present

## 2016-01-14 DIAGNOSIS — L570 Actinic keratosis: Secondary | ICD-10-CM | POA: Diagnosis not present

## 2016-01-14 DIAGNOSIS — I10 Essential (primary) hypertension: Secondary | ICD-10-CM | POA: Diagnosis not present

## 2016-03-03 DIAGNOSIS — D225 Melanocytic nevi of trunk: Secondary | ICD-10-CM | POA: Diagnosis not present

## 2016-03-03 DIAGNOSIS — L309 Dermatitis, unspecified: Secondary | ICD-10-CM | POA: Diagnosis not present

## 2016-03-03 DIAGNOSIS — L814 Other melanin hyperpigmentation: Secondary | ICD-10-CM | POA: Diagnosis not present

## 2016-03-03 DIAGNOSIS — Z85828 Personal history of other malignant neoplasm of skin: Secondary | ICD-10-CM | POA: Diagnosis not present

## 2016-03-03 DIAGNOSIS — L821 Other seborrheic keratosis: Secondary | ICD-10-CM | POA: Diagnosis not present

## 2016-04-02 DIAGNOSIS — N390 Urinary tract infection, site not specified: Secondary | ICD-10-CM | POA: Diagnosis not present

## 2016-04-02 DIAGNOSIS — R4182 Altered mental status, unspecified: Secondary | ICD-10-CM | POA: Diagnosis not present

## 2016-07-12 IMAGING — DX DG ANKLE PORT 2V*R*
2 series · 2 of 2 positions shown · non-contrast
Comparison: None.

CLINICAL DATA: No known injury. Generalized ankle pain. Initial
encounter.

EXAM:
PORTABLE RIGHT ANKLE - 2 VIEW

[ankle ap]
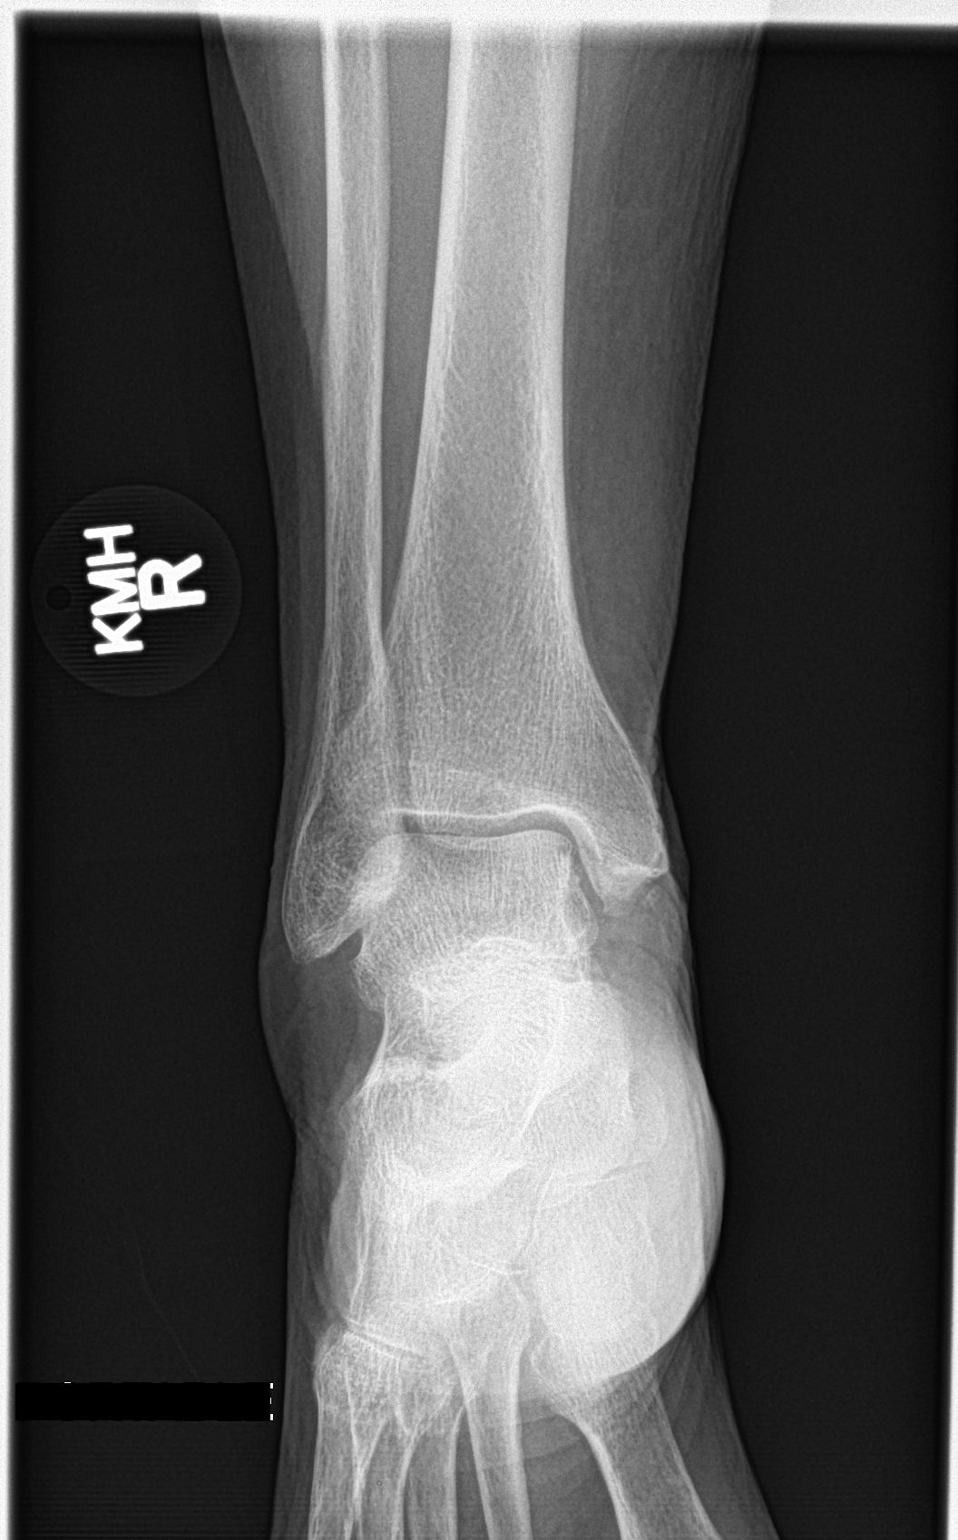

[ankle lat]
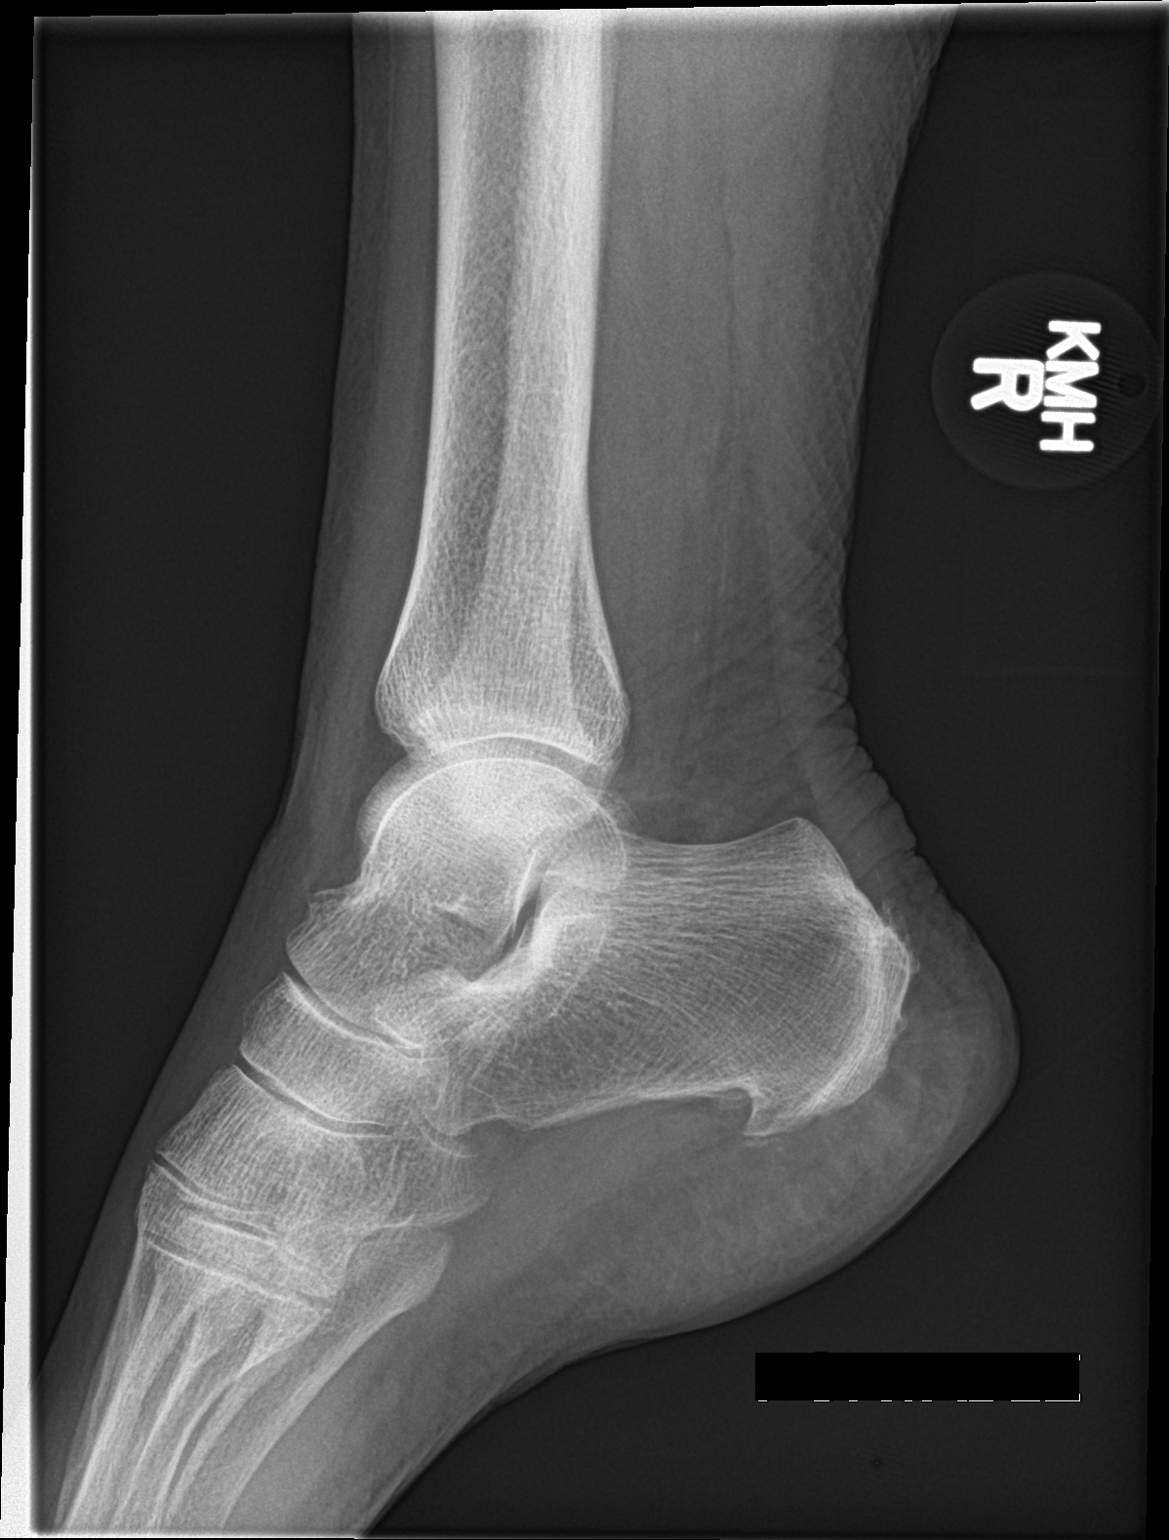

[2 of 2 positions shown; findings below may reference images not displayed]

FINDINGS: Normal anatomic alignment. No evidence for acute fracture or
dislocation. Plantar calcaneal spurring. Regional soft tissues
unremarkable.
IMPRESSION: No acute osseous abnormality.

## 2016-08-12 ENCOUNTER — Ambulatory Visit (INDEPENDENT_AMBULATORY_CARE_PROVIDER_SITE_OTHER): Payer: Medicare Other | Admitting: Adult Health

## 2016-08-12 ENCOUNTER — Inpatient Hospital Stay (HOSPITAL_COMMUNITY): Admission: AD | Admit: 2016-08-12 | Payer: Medicare Other | Source: Ambulatory Visit | Admitting: Pulmonary Disease

## 2016-08-12 ENCOUNTER — Encounter: Payer: Self-pay | Admitting: Adult Health

## 2016-08-12 DIAGNOSIS — J449 Chronic obstructive pulmonary disease, unspecified: Secondary | ICD-10-CM

## 2016-08-12 NOTE — Patient Instructions (Signed)
Admit Sarah Johnson

## 2016-08-12 NOTE — Progress Notes (Addendum)
@Patient  ID: Sarah Johnson, female    DOB: Mar 22, 1933, 83 y.o.   MRN: [id number]  Chief Complaint  Patient presents with  . Follow-up    COPD     Referring provider: Jani Gravel, MD  HPI: 83 yo female with known moderate COPD (FEV1 50%-2010)  Hx VTE on Xarelto,  s/p AVR .    08/12/2016 Follow up  : COPD  Pt presents for a work in visit . Accompanied by her daughter . Has severe dementia . Says progressive decline over last 6 months . Now gets sob with minimal activity . More with intermittent wheezing . No fever or discolored mucus .  Confusion is getting worse. Daughter says she was on oxygen in past but refused to wear it . On arrival to office today O2 sats were 69% on room air. On 3l/m O2 sats 96%.  She will be admitted for further evaluation .    Allergies  Allergen Reactions  . Codeine Other (See Comments)    Pt. States it has been too long to remember the reaction   . Penicillins Other (See Comments)    Pt. Does not recall the reaction.  Has patient had a PCN reaction causing immediate rash, facial/tongue/throat swelling, SOB or lightheadedness with hypotension:  Has patient had a PCN reaction causing severe rash involving mucus membranes or skin necrosis: Has patient had a PCN reaction that required hospitalization  Has patient had a PCN reaction occurring within the last 10 years:  If all of the above answers are "NO", then may proceed with Cephalosporin use.     Immunization History  Administered Date(s) Administered  . Influenza Whole 12/24/2008, 02/25/2011  . Influenza, High Dose Seasonal PF 02/24/2006  . Influenza,inj,Quad PF,36+ Mos 12/26/2014  . Influenza-Unspecified 12/25/2013  . Pneumococcal Conjugate-13 05/08/2014  . Pneumococcal Polysaccharide-23 12/24/2008    Past Medical History:  Diagnosis Date  . Arthritis   . Blind right eye   . Cataract    RT  . CKD (chronic kidney disease)   . COPD (chronic obstructive pulmonary disease) ([place])   .  Emphysema    followed by Dr. Joya Gaskins  . GERD (gastroesophageal reflux disease)   . History of Doppler echocardiogram    carotid dopplers. (8/11) no significant stenosis  . HTN (hypertension)   . Hx of echocardiogram 2015   Echo (08/2013):  EF 60-65%, no RWMA, Gr 1 DD, AVR ok (mean 5 mmHg), mild MR, PASP 22 mmHg  . Hyperlipidemia   . Hypothyroidism   . Pulmonary embolism ([place]) 2010   She is no onger taking coumadin  . S/P aortic valve replacement with bioprosthetic valve    ehco (8/11) with EF 55-60%, mild-mod MR, mild-mod TR, poorly visualized bioprosthetic aortic valve but no significant regurgitation and gradient not significanly elevated.    . S/P thoracic aortic aneurysm repair 1/10   also with bioprosthetic aortic valve prelacement [facility]). in 10/10 she has a descending thoraic aorta anuerysm and abdominal reapir [facility]). Vascular urgeon is Dr. Lunette Stands.   Marland Kitchen SVT (supraventricular tachycardia) (HCC)    HOLTER MONITOR AFTER SURGERY     Tobacco History: History  Smoking Status  . Former Smoker  . Packs/day: 1.00  . Years: 20.00  . Types: Cigarettes  . Quit date: 04/26/2004  Smokeless Tobacco  . Never Used    Comment: quit in 2006   Counseling given: Not Answered   Outpatient Encounter Prescriptions as of 08/12/2016  Medication Sig  . atorvastatin (LIPITOR)  20 MG tablet TAKE 1 TABLET BY MOUTH EVERY DAY  . Calcium Carbonate-Vitamin D (CALCIUM-VITAMIN D) 600-200 MG-UNIT CAPS Take 1 tablet by mouth daily.  Marland Kitchen donepezil (ARICEPT) 10 MG tablet TAKE 1 TABLET (10 MG TOTAL) BY MOUTH AT BEDTIME.  . ergocalciferol (VITAMIN D2) 50000 UNITS capsule Take 50,000 Units by mouth every 30 (thirty) days.  Marland Kitchen L-Methylfolate-B12-B6-B2 (CEREFOLIN PO) Take 1 tablet by mouth daily.  Marland Kitchen levothyroxine (SYNTHROID, LEVOTHROID) 112 MCG tablet Take 112 mcg by mouth daily.  . memantine (NAMENDA) 10 MG tablet Take 10 mg by mouth 2 (two) times daily.   . metoprolol tartrate (LOPRESSOR) 25 MG tablet  Take 0.5 tablets (12.5 mg total) by mouth 2 (two) times daily.  . mometasone-formoterol (DULERA) 100-5 MCG/ACT AERO INHALE 2 PUFFS INTO THE LUNGS 2 TIMES DAILY. RINSE MOUTH AFTER EACH USE  . Multiple Vitamin (MULITIVITAMIN WITH MINERALS) TABS Take 1 tablet by mouth daily.  Marland Kitchen PROAIR HFA 108 (90 BASE) MCG/ACT inhaler Inhale 2 puffs into the lungs every 6 (six) hours as needed. (Patient taking differently: Inhale 2 puffs into the lungs every 6 (six) hours as needed for wheezing or shortness of breath. )  . Rivaroxaban (XARELTO) 15 MG TABS tablet Take 1 tablet (15 mg total) by mouth 2 (two) times daily with a meal. (Patient taking differently: Take 15 mg by mouth daily with supper. )  . saccharomyces boulardii (FLORASTOR) 250 MG capsule Take 1 capsule (250 mg total) by mouth 2 (two) times daily.  . sertraline (ZOLOFT) 50 MG tablet Take 50 mg by mouth at bedtime.   Marland Kitchen ibuprofen (ADVIL,MOTRIN) 200 MG tablet Take 2 tablets (400 mg total) by mouth daily as needed for moderate pain. (Patient not taking: Reported on 08/12/2016)  . omeprazole (PRILOSEC) 20 MG capsule Take 20 mg by mouth daily.  Marland Kitchen thiamine 100 MG tablet Take 1 tablet (100 mg total) by mouth daily. (Patient not taking: Reported on 08/12/2016)  . [DISCONTINUED] doxycycline (VIBRAMYCIN) 100 MG capsule Take 1 capsule (100 mg total) by mouth 2 (two) times daily. 11/26/15-12/05/15 (Patient not taking: Reported on 08/12/2016)  . [DISCONTINUED] Hydrocodone-Acetaminophen 5-300 MG TABS Take 0.5-1 tablets by mouth 2 (two) times daily as needed (pain). Take 1 tablet by mouth every 6 hours as needed    No facility-administered encounter medications on file as of 08/12/2016.      Review of Systems  Constitutional:   No  weight loss, night sweats,  Fevers, chills, + fatigue, or  lassitude.  HEENT:   No headaches,  Difficulty swallowing,  Tooth/dental problems, or  Sore throat,                No sneezing, itching, ear ache, nasal congestion, post nasal drip,    CV:  No chest pain,  Orthopnea, PND, swelling in lower extremities, anasarca, dizziness, palpitations, syncope.   GI  No heartburn, indigestion, abdominal pain, nausea, vomiting, diarrhea, change in bowel habits, loss of appetite, bloody stools.   Resp: .  No chest wall deformity  Skin: no rash or lesions.  GU: no dysuria, change in color of urine, no urgency or frequency.  No flank pain, no hematuria   MS:  No joint pain or swelling.  No decreased range of motion.  No back pain.    Physical Exam  BP 122/68 (BP Location: Left Arm, Cuff Size: Normal)   Pulse 71   Ht 5\' 2"  (1.575 m)   Wt 144 lb (65.3 kg)   SpO2 92%   BMI  26.34 kg/m   GEN: A/Ox3; pleasant , NAD , elderly    HEENT:  [place]/AT,  EACs-clear, TMs-wnl, NOSE-clear, THROAT-clear, no lesions, no postnasal drip or exudate noted.   NECK:  Supple w/ fair ROM; no JVD; normal carotid impulses w/o bruits; no thyromegaly or nodules palpated; no lymphadenopathy.    RESP  Decreased BS in bases , few trace wheezes , no accessory muscle use, no dullness to percussion  CARD:  RRR, no m/r/g, no peripheral edema, pulses intact, no cyanosis or clubbing.  GI:   Soft & nt; nml bowel sounds; no organomegaly or masses detected.   Musco: Warm bil, no deformities or joint swelling noted.   Neuro: alert, no focal deficits noted.    Skin: Warm, no lesions or rashes    Lab Results:  CBC  BNP No results found for: BNP  ProBNP No results found for: PROBNP  Imaging: No results found.   Assessment & Plan:   COPD (chronic obstructive pulmonary disease) ([place]) ? Exacerbation w/ hypoxemia  Admit to [facility] .    Late add:  EMS arrived to office for transport after 74min , pt adamantly refused to go to hospital . . Family including daughter declined to pt transport. They are aware of potential complications and dangers of hypoxia. And worsening COPD Exacerbation . EMS w/ communication of dangers as well. Dr. Vaughan Browner in with pt . Pt  refused transport.  Advised daughter she can take to ER via pv  But declines  advised if changes their minds can go to ER or call back . Advised to call back if they change their mind on Oxygen.  Can order to be started at home .  Orders and H/P note from hospital canceled.   Time spent 1.25hr .     Rexene Edison, NP 08/12/2016

## 2016-08-12 NOTE — Addendum Note (Signed)
Addended by: Melvenia Needles on: 08/12/2016 06:00 PM   Modules accepted: Level of Service

## 2016-08-12 NOTE — Assessment & Plan Note (Addendum)
?   Exacerbation w/ hypoxemia  Admit to [facility] .    Late add:  EMS arrived to office for transport after 98min , pt adamantly refused to go to hospital . . Family including daughter declined to pt transport. They are aware of potential complications and dangers of hypoxia. And worsening COPD Exacerbation . EMS w/ communication of dangers as well. Dr. Vaughan Browner in with pt . Pt refused transport.  Advised daughter she can take to ER via pv  But declines  advised if changes their minds can go to ER or call back . Advised to call back if they change their mind on Oxygen.  Can order to be started at home .  Orders and H/P note from hospital canceled.

## 2016-08-12 NOTE — Progress Notes (Signed)
Called patient's daughter to confirm that the patient did refuse to be admitted to the hospital and that we do not need to continue to hold a room. [name] did confirm that yes the patient refused and would not be coming to the hospital at this time. Will make patient placement aware to cancel bed request.   Othella Boyer [facility] 08/12/2016 6:37 PM

## 2016-08-13 ENCOUNTER — Telehealth: Payer: Self-pay | Admitting: Adult Health

## 2016-08-13 DIAGNOSIS — J449 Chronic obstructive pulmonary disease, unspecified: Secondary | ICD-10-CM

## 2016-08-13 MED ORDER — IPRATROPIUM-ALBUTEROL 0.5-2.5 (3) MG/3ML IN SOLN: 3.0000 mL | Freq: Two times a day (BID) | RESPIRATORY_TRACT | 5 refills | Status: AC

## 2016-08-13 NOTE — Telephone Encounter (Signed)
Spoke with patient's daughter [name] who reported she received a call from pt this morning stating she having difficulty breathing.  Cathy's sister went to check on patient and once she got up and breathing improved - she is lucid and now doing well.  [name] stated she is wondering what type of support patient will need at home moving forward.    Discussed with TP: Home O2 3lpm w/ activity.  Let's begin Duoneb twice daily.  Continue on Dulera 2 puffs twice daily.  Either follow up back with our office or her primary care provider in 4 weeks.  Sooner if needed or seek emergency attention for worsening symptoms.

## 2016-08-13 NOTE — Telephone Encounter (Signed)
Called spoke with [name] and discussed TP's recommendations with her [name] voiced her understanding and denied any questions/concerns at this time She was provided [facility] phone number to call with any questions Appt scheduled to see TP in 4 weeks >> 5.22.18 @ 2pm Pt is already scheduled to see her PCP next week per [name]  Orders placed for STAT new O2 start and Duoneb start Rx printed, signed by TP and placed in [facility] look at Staff message sent to [facility] to let him know of pending orders [name] is aware to call the office if anything further is needed and to seek emergency care if needed  Nothing further needed; will sign

## 2016-08-13 NOTE — Telephone Encounter (Signed)
TP did you call this pts daughter?  Anything that I can speak with her about?  thanks

## 2016-08-13 NOTE — Telephone Encounter (Signed)
STAT new O2 order sent for patient earlier today for sats at 69% room air at yesterday's ov w/ TP Called spoke with Corene Cornea w/ [facility] who reported that Medicare may not cover the O2 because pt was in an acute state yesterday and refused treatment/admission  TP spoke with Corene Cornea, O2 will still be provided to patient and she is scheduled for an appt that will provide the chronic state information that is requested  Will call pt's daughter [name] and let her know that O2 may need to be cash pay for now, and we will get pt back in so that we can document that pt is in a stable state and still requiring O2.  Called spoke with [name], explained the situation above.  [name] voiced her understanding.  She took notes and will discuss with patient's PCP to assist with this process of documenting need for O2 while in a stable state.  Lynelle Doctor to please let me know of PCP appt date/time so that I can assist and let [facility] know.  [name] voiced her understanding.  Nothing further needed at this time; will sign off.

## 2016-08-17 DIAGNOSIS — J40 Bronchitis, not specified as acute or chronic: Secondary | ICD-10-CM | POA: Diagnosis not present

## 2016-08-17 DIAGNOSIS — J449 Chronic obstructive pulmonary disease, unspecified: Secondary | ICD-10-CM | POA: Diagnosis not present

## 2016-08-17 DIAGNOSIS — R05 Cough: Secondary | ICD-10-CM | POA: Diagnosis not present

## 2016-08-18 ENCOUNTER — Telehealth: Payer: Self-pay | Admitting: Adult Health

## 2016-08-18 NOTE — Telephone Encounter (Signed)
Spoke with the pt's daughter  She is calling to let us know that pt was seen by her PCP at [facility] and her sats were checked off o2 and while on o2  She states that they were to fax the notes to [place] so [facility] could have the documentation that they needed  I advised that our fax machines have been down and are still down, and so have not received anything unfortunately  I have called [facility] and spoke with Estill Bamberg and explained the situation  She is going to let the nurse for Dr Maudie Mercury know, and have them fax records to [facility] since our machine is down  I have left msg for Corene Cornea at [facility] to let him know  Will need confirmation that they have received records  Pt's daughter is aware we are working on this and will also keep her updated  Will await Jason's call

## 2016-08-18 NOTE — Telephone Encounter (Signed)
Will await call back from [facility] or follow up 08/19/16 AM

## 2016-08-18 NOTE — Telephone Encounter (Signed)
Corene Cornea says they do not have access to pull records from [facility].  He states he will call [place] now and see if they can fax them to him and he will review and get back with [place] tomorrow.

## 2016-08-19 NOTE — Telephone Encounter (Signed)
Sarah Johnson w/ [facility] returned call He does have the documenation from [facility] but he is concerned because it doesn't look like the qualifying documentation was done correctly with the sat on O2 w/ exertion.  Because this office has been having difficulty receiving faxes, Sarah Johnson is giving the [place] office note to [facility] to bring to the office later today.  Routing to myself for follow up

## 2016-08-25 DIAGNOSIS — E039 Hypothyroidism, unspecified: Secondary | ICD-10-CM | POA: Diagnosis not present

## 2016-08-25 DIAGNOSIS — I10 Essential (primary) hypertension: Secondary | ICD-10-CM | POA: Diagnosis not present

## 2016-08-25 DIAGNOSIS — F0390 Unspecified dementia without behavioral disturbance: Secondary | ICD-10-CM | POA: Diagnosis not present

## 2016-08-26 NOTE — Telephone Encounter (Signed)
Jess, any updates? Can triage help?

## 2016-09-01 DIAGNOSIS — E039 Hypothyroidism, unspecified: Secondary | ICD-10-CM | POA: Diagnosis not present

## 2016-09-01 DIAGNOSIS — R350 Frequency of micturition: Secondary | ICD-10-CM | POA: Diagnosis not present

## 2016-09-01 DIAGNOSIS — J449 Chronic obstructive pulmonary disease, unspecified: Secondary | ICD-10-CM | POA: Diagnosis not present

## 2016-09-01 DIAGNOSIS — I10 Essential (primary) hypertension: Secondary | ICD-10-CM | POA: Diagnosis not present

## 2016-09-01 DIAGNOSIS — Z0000 Encounter for general adult medical examination without abnormal findings: Secondary | ICD-10-CM | POA: Diagnosis not present

## 2016-09-06 DIAGNOSIS — R350 Frequency of micturition: Secondary | ICD-10-CM | POA: Diagnosis not present

## 2016-09-06 DIAGNOSIS — N390 Urinary tract infection, site not specified: Secondary | ICD-10-CM | POA: Diagnosis not present

## 2016-09-06 NOTE — Telephone Encounter (Signed)
Jess any updates on this pt>

## 2016-09-08 NOTE — Telephone Encounter (Signed)
Sorry - called [facility] to check that everything was taken care of Was advised that all necessary documentation has been received and nothing further is needed Will sign off

## 2016-09-14 ENCOUNTER — Ambulatory Visit: Payer: Medicare Other | Admitting: Adult Health

## 2016-11-29 DIAGNOSIS — I10 Essential (primary) hypertension: Secondary | ICD-10-CM | POA: Diagnosis not present

## 2016-11-29 DIAGNOSIS — E039 Hypothyroidism, unspecified: Secondary | ICD-10-CM | POA: Diagnosis not present

## 2016-11-29 DIAGNOSIS — M810 Age-related osteoporosis without current pathological fracture: Secondary | ICD-10-CM | POA: Diagnosis not present

## 2016-12-02 DIAGNOSIS — E039 Hypothyroidism, unspecified: Secondary | ICD-10-CM | POA: Diagnosis not present

## 2016-12-02 DIAGNOSIS — E7800 Pure hypercholesterolemia, unspecified: Secondary | ICD-10-CM | POA: Diagnosis not present

## 2016-12-02 DIAGNOSIS — I10 Essential (primary) hypertension: Secondary | ICD-10-CM | POA: Diagnosis not present

## 2016-12-16 DIAGNOSIS — E559 Vitamin D deficiency, unspecified: Secondary | ICD-10-CM | POA: Diagnosis not present

## 2016-12-16 DIAGNOSIS — I10 Essential (primary) hypertension: Secondary | ICD-10-CM | POA: Diagnosis not present

## 2016-12-23 DIAGNOSIS — E039 Hypothyroidism, unspecified: Secondary | ICD-10-CM | POA: Diagnosis not present

## 2016-12-23 DIAGNOSIS — I10 Essential (primary) hypertension: Secondary | ICD-10-CM | POA: Diagnosis not present

## 2017-01-30 DIAGNOSIS — Z23 Encounter for immunization: Secondary | ICD-10-CM | POA: Diagnosis not present

## 2017-02-23 DIAGNOSIS — R609 Edema, unspecified: Secondary | ICD-10-CM | POA: Diagnosis not present

## 2017-03-03 DIAGNOSIS — S299XXA Unspecified injury of thorax, initial encounter: Secondary | ICD-10-CM | POA: Diagnosis not present

## 2017-03-03 DIAGNOSIS — R600 Localized edema: Secondary | ICD-10-CM | POA: Diagnosis not present

## 2017-03-03 DIAGNOSIS — R079 Chest pain, unspecified: Secondary | ICD-10-CM | POA: Diagnosis not present

## 2017-03-03 DIAGNOSIS — M25511 Pain in right shoulder: Secondary | ICD-10-CM | POA: Diagnosis not present

## 2017-03-10 DIAGNOSIS — R609 Edema, unspecified: Secondary | ICD-10-CM | POA: Diagnosis not present

## 2017-03-10 DIAGNOSIS — E039 Hypothyroidism, unspecified: Secondary | ICD-10-CM | POA: Diagnosis not present

## 2017-04-17 ENCOUNTER — Inpatient Hospital Stay (HOSPITAL_COMMUNITY)
Admission: EM | Admit: 2017-04-17 | Discharge: 2017-04-22 | DRG: 481 | Disposition: A | Discharge status: Skilled Nursing Facility | Payer: Medicare Other | Attending: Internal Medicine | Admitting: Internal Medicine

## 2017-04-17 ENCOUNTER — Emergency Department (HOSPITAL_COMMUNITY): Payer: Medicare Other

## 2017-04-17 ENCOUNTER — Other Ambulatory Visit: Payer: Self-pay

## 2017-04-17 DIAGNOSIS — F0390 Unspecified dementia without behavioral disturbance: Secondary | ICD-10-CM | POA: Diagnosis not present

## 2017-04-17 DIAGNOSIS — D62 Acute posthemorrhagic anemia: Secondary | ICD-10-CM | POA: Diagnosis not present

## 2017-04-17 DIAGNOSIS — Z953 Presence of xenogenic heart valve: Secondary | ICD-10-CM

## 2017-04-17 DIAGNOSIS — W1830XA Fall on same level, unspecified, initial encounter: Secondary | ICD-10-CM | POA: Diagnosis present

## 2017-04-17 DIAGNOSIS — Z7901 Long term (current) use of anticoagulants: Secondary | ICD-10-CM

## 2017-04-17 DIAGNOSIS — Z01818 Encounter for other preprocedural examination: Secondary | ICD-10-CM

## 2017-04-17 DIAGNOSIS — R9431 Abnormal electrocardiogram [ECG] [EKG]: Secondary | ICD-10-CM | POA: Diagnosis not present

## 2017-04-17 DIAGNOSIS — D72828 Other elevated white blood cell count: Secondary | ICD-10-CM | POA: Diagnosis present

## 2017-04-17 DIAGNOSIS — S72001A Fracture of unspecified part of neck of right femur, initial encounter for closed fracture: Secondary | ICD-10-CM

## 2017-04-17 DIAGNOSIS — Z79890 Hormone replacement therapy (postmenopausal): Secondary | ICD-10-CM

## 2017-04-17 DIAGNOSIS — Y939 Activity, unspecified: Secondary | ICD-10-CM

## 2017-04-17 DIAGNOSIS — Z86718 Personal history of other venous thrombosis and embolism: Secondary | ICD-10-CM

## 2017-04-17 DIAGNOSIS — T148XXA Other injury of unspecified body region, initial encounter: Secondary | ICD-10-CM | POA: Diagnosis not present

## 2017-04-17 DIAGNOSIS — S7291XA Unspecified fracture of right femur, initial encounter for closed fracture: Secondary | ICD-10-CM | POA: Diagnosis not present

## 2017-04-17 DIAGNOSIS — N179 Acute kidney failure, unspecified: Secondary | ICD-10-CM | POA: Diagnosis not present

## 2017-04-17 DIAGNOSIS — E039 Hypothyroidism, unspecified: Secondary | ICD-10-CM | POA: Diagnosis present

## 2017-04-17 DIAGNOSIS — Z6828 Body mass index (BMI) 28.0-28.9, adult: Secondary | ICD-10-CM

## 2017-04-17 DIAGNOSIS — S0990XA Unspecified injury of head, initial encounter: Secondary | ICD-10-CM | POA: Diagnosis not present

## 2017-04-17 DIAGNOSIS — I129 Hypertensive chronic kidney disease with stage 1 through stage 4 chronic kidney disease, or unspecified chronic kidney disease: Secondary | ICD-10-CM | POA: Diagnosis present

## 2017-04-17 DIAGNOSIS — S79911A Unspecified injury of right hip, initial encounter: Secondary | ICD-10-CM | POA: Diagnosis not present

## 2017-04-17 DIAGNOSIS — E785 Hyperlipidemia, unspecified: Secondary | ICD-10-CM | POA: Diagnosis present

## 2017-04-17 DIAGNOSIS — N182 Chronic kidney disease, stage 2 (mild): Secondary | ICD-10-CM | POA: Diagnosis present

## 2017-04-17 DIAGNOSIS — Z66 Do not resuscitate: Secondary | ICD-10-CM | POA: Diagnosis present

## 2017-04-17 DIAGNOSIS — H5461 Unqualified visual loss, right eye, normal vision left eye: Secondary | ICD-10-CM | POA: Diagnosis present

## 2017-04-17 DIAGNOSIS — Z419 Encounter for procedure for purposes other than remedying health state, unspecified: Secondary | ICD-10-CM

## 2017-04-17 DIAGNOSIS — I739 Peripheral vascular disease, unspecified: Secondary | ICD-10-CM | POA: Diagnosis present

## 2017-04-17 DIAGNOSIS — Y92000 Kitchen of unspecified non-institutional (private) residence as  the place of occurrence of the external cause: Secondary | ICD-10-CM

## 2017-04-17 DIAGNOSIS — K219 Gastro-esophageal reflux disease without esophagitis: Secondary | ICD-10-CM | POA: Diagnosis present

## 2017-04-17 DIAGNOSIS — E875 Hyperkalemia: Secondary | ICD-10-CM | POA: Diagnosis not present

## 2017-04-17 DIAGNOSIS — Z86711 Personal history of pulmonary embolism: Secondary | ICD-10-CM

## 2017-04-17 DIAGNOSIS — Z87891 Personal history of nicotine dependence: Secondary | ICD-10-CM

## 2017-04-17 DIAGNOSIS — Z952 Presence of prosthetic heart valve: Secondary | ICD-10-CM

## 2017-04-17 DIAGNOSIS — J810 Acute pulmonary edema: Secondary | ICD-10-CM | POA: Diagnosis not present

## 2017-04-17 DIAGNOSIS — E669 Obesity, unspecified: Secondary | ICD-10-CM | POA: Diagnosis present

## 2017-04-17 DIAGNOSIS — J439 Emphysema, unspecified: Secondary | ICD-10-CM | POA: Diagnosis present

## 2017-04-17 DIAGNOSIS — S72141A Displaced intertrochanteric fracture of right femur, initial encounter for closed fracture: Secondary | ICD-10-CM | POA: Diagnosis present

## 2017-04-17 DIAGNOSIS — I272 Pulmonary hypertension, unspecified: Secondary | ICD-10-CM | POA: Diagnosis present

## 2017-04-17 LAB — BASIC METABOLIC PANEL
Anion gap: 9 (ref 5–15)
BUN: 17 mg/dL (ref 6–20)
CHLORIDE: 101 mmol/L (ref 101–111)
CO2: 30 mmol/L (ref 22–32)
CREATININE: 1.46 mg/dL — AB (ref 0.44–1.00)
Calcium: 9.3 mg/dL (ref 8.9–10.3)
GFR calc Af Amer: 37 mL/min — ABNORMAL LOW (ref >60)
GFR calc non Af Amer: 32 mL/min — ABNORMAL LOW (ref >60)
Glucose, Bld: 131 mg/dL — ABNORMAL HIGH (ref 65–99)
POTASSIUM: 4.3 mmol/L (ref 3.5–5.1)
SODIUM: 140 mmol/L (ref 135–145)

## 2017-04-17 LAB — CBC WITH DIFFERENTIAL/PLATELET
Basophils Absolute: 0 10*3/uL (ref 0.0–0.1)
Basophils Relative: 0 %
EOS ABS: 0 10*3/uL (ref 0.0–0.7)
Eosinophils Relative: 0 %
HEMATOCRIT: 42.7 % (ref 36.0–46.0)
Hemoglobin: 13.3 g/dL (ref 12.0–15.0)
LYMPHS PCT: 6 %
Lymphs Abs: 1.3 10*3/uL (ref 0.7–4.0)
MCH: 29.6 pg (ref 26.0–34.0)
MCHC: 31.1 g/dL (ref 30.0–36.0)
MCV: 94.9 fL (ref 78.0–100.0)
MONOS PCT: 5 %
Monocytes Absolute: 1.2 10*3/uL — ABNORMAL HIGH (ref 0.1–1.0)
NEUTROS ABS: 19.8 10*3/uL — AB (ref 1.7–7.7)
NEUTROS PCT: 89 %
Platelets: 148 10*3/uL — ABNORMAL LOW (ref 150–400)
RBC: 4.5 MIL/uL (ref 3.87–5.11)
RDW: 17 % — ABNORMAL HIGH (ref 11.5–15.5)
WBC: 22.3 10*3/uL — AB (ref 4.0–10.5)

## 2017-04-17 MED ORDER — FENTANYL CITRATE (PF) 100 MCG/2ML IJ SOLN
50.0000 ug | Freq: Once | INTRAMUSCULAR | Status: AC
  Administered 2017-04-17: 50 ug via INTRAVENOUS
  Filled 2017-04-17: qty 2

## 2017-04-17 NOTE — ED Triage Notes (Signed)
Pt from home via [facility]. Pt found laying on floor by family. C/o R hip pain. Sts fell in kitchen, but denies LOC. Swelling, shortening noted to R leg. Denies pain @ this time. A&O x4 on arrival.

## 2017-04-17 NOTE — ED Provider Notes (Signed)
[facility] EMERGENCY DEPARTMENT Provider Note   CSN: [id number] Arrival date & time: 04/17/17  2219     History   Chief Complaint Chief Complaint  Patient presents with  . Fall    HPI Sarah Johnson is a 83 y.o. female.  HPI  This an 83 year old female who presents following a fall.  Patient has a history of dementia and is unable to provide details of the fall.  Family found her on the floor.  She is only complaining of right leg pain.  Right leg noted to be foreshortened by EMS.  Unclear whether she lost consciousness.  She is on Xarelto for DVT.  She has not taken her nightly dose of Xarelto.  She also has an extensive history of COPD and cardiac disease including valve repair and thoracic aortic aneurysm repair.  Patient denies any pain elsewhere.  Level 5 caveat for dementia  Past Medical History:  Diagnosis Date  . Arthritis   . Blind right eye   . Cataract    RT  . CKD (chronic kidney disease)   . COPD (chronic obstructive pulmonary disease) ([place])   . Emphysema    followed by Dr. Joya Gaskins  . GERD (gastroesophageal reflux disease)   . History of Doppler echocardiogram    carotid dopplers. (8/11) no significant stenosis  . HTN (hypertension)   . Hx of echocardiogram 2015   Echo (08/2013):  EF 60-65%, no RWMA, Gr 1 DD, AVR ok (mean 5 mmHg), mild MR, PASP 22 mmHg  . Hyperlipidemia   . Hypothyroidism   . Pulmonary embolism ([place]) 2010   She is no onger taking coumadin  . S/P aortic valve replacement with bioprosthetic valve    ehco (8/11) with EF 55-60%, mild-mod MR, mild-mod TR, poorly visualized bioprosthetic aortic valve but no significant regurgitation and gradient not significanly elevated.    . S/P thoracic aortic aneurysm repair 1/10   also with bioprosthetic aortic valve prelacement [facility]). in 10/10 she has a descending thoraic aorta anuerysm and abdominal reapir [facility]). Vascular urgeon is Dr. Lunette Stands.   Marland Kitchen SVT  (supraventricular tachycardia) ([place])    HOLTER MONITOR AFTER SURGERY     Patient Active Problem List   Diagnosis Date Noted  . Cellulitis 11/28/2015  . Cellulitis of right hand 11/28/2015  . Hypothyroidism 11/28/2015  . COPD (chronic obstructive pulmonary disease) ([place]) 09/25/2015  . Leukocytosis 02/11/2015  . Hypokalemia 02/11/2015  . Hypomagnesemia 02/11/2015  . Deep vein thrombosis (DVT) of right lower extremity ([place]) 02/08/2015  . CKD (chronic kidney disease), stage II 02/05/2015  . Anemia of chronic renal failure, stage 2 (mild) 02/05/2015  . Thrombocytopenia ([place]) 02/05/2015  . Left lower lobe pneumonia ([place]) 02/05/2015  . Sepsis ([place])   . History of Clostridium difficile colitis 05/09/2014  . S/P AVR (aortic valve replacement) 11/05/2010  . HTN (hypertension) 11/04/2009    Past Surgical History:  Procedure Laterality Date  . ANOMALOUS PULMONARY VENOUS RETURN REPAIR    . BACK SURGERY  1970   . EYE SURGERY     AS BABY- RT EYE  POOR SIGHT  . LUMBAR LAMINECTOMY/DECOMPRESSION MICRODISCECTOMY  05/31/2011   Procedure: LUMBAR LAMINECTOMY/DECOMPRESSION MICRODISCECTOMY;  Surgeon: Elaina Hoops, MD;  Location: [facility];  Service: Neurosurgery;  Laterality: Bilateral;  Bilateral Lumbar three-four decompressionj lumbar laminectomy   . THORACIC AORTIC ANEURYSM REPAIR  1/10   VALVE ALSO DONE  . THORACOABDOMINAL AORTIC ANEURYSM REPAIR  01/28/09   repair  . TONSILLECTOMY  OB History    No data available       Home Medications    Prior to Admission medications   Medication Sig Start Date End Date Taking? Authorizing Provider  atorvastatin (LIPITOR) 20 MG tablet TAKE 1 TABLET BY MOUTH EVERY DAY 09/20/14  Yes Larey Dresser, MD  donepezil (ARICEPT) 10 MG tablet TAKE 1 TABLET (10 MG TOTAL) BY MOUTH AT BEDTIME. 10/19/14  Yes Star Age, MD  ibuprofen (ADVIL,MOTRIN) 200 MG tablet Take 2 tablets (400 mg total) by mouth daily as needed for moderate pain. 02/12/15  Yes Florencia Reasons,  MD  ipratropium-albuterol (DUONEB) 0.5-2.5 (3) MG/3ML SOLN Take 3 mLs by nebulization 2 (two) times daily. Dx: J44.9 Patient taking differently: Take 3 mLs by nebulization every 4 (four) hours as needed (SOB). Dx: J44.9 08/13/16  Yes Parrett, Tammy S, NP  L-Methylfolate-B12-B6-B2 (CEREFOLIN PO) Take 1 tablet by mouth daily.   Yes [provider]  levothyroxine (SYNTHROID, LEVOTHROID) 112 MCG tablet Take 137 mcg by mouth daily.  12/24/14  Yes [provider]  memantine (NAMENDA) 10 MG tablet Take 10 mg by mouth 2 (two) times daily.  09/01/15  Yes [provider]  metoprolol tartrate (LOPRESSOR) 25 MG tablet Take 0.5 tablets (12.5 mg total) by mouth 2 (two) times daily. 02/12/15  Yes Florencia Reasons, MD  mometasone-formoterol (DULERA) 100-5 MCG/ACT AERO INHALE 2 PUFFS INTO THE LUNGS 2 TIMES DAILY. RINSE MOUTH AFTER EACH USE 09/17/15  Yes Parrett, Tammy S, NP  Multiple Vitamin (MULITIVITAMIN WITH MINERALS) TABS Take 1 tablet by mouth daily.   Yes [provider]  PROAIR HFA 108 (90 BASE) MCG/ACT inhaler Inhale 2 puffs into the lungs every 6 (six) hours as needed. Patient taking differently: Inhale 2 puffs into the lungs every 6 (six) hours as needed for wheezing or shortness of breath.  05/08/14  Yes Elsie Stain, MD  Rivaroxaban (XARELTO) 15 MG TABS tablet Take 1 tablet (15 mg total) by mouth 2 (two) times daily with a meal. Patient taking differently: Take 15 mg by mouth daily with supper.  02/12/15 08/12/17 Yes Florencia Reasons, MD  saccharomyces boulardii (FLORASTOR) 250 MG capsule Take 1 capsule (250 mg total) by mouth 2 (two) times daily. 03/29/14  Yes Robbie Lis, MD  sertraline (ZOLOFT) 50 MG tablet Take 50 mg by mouth at bedtime.  07/19/13  Yes [provider]  thiamine 100 MG tablet Take 1 tablet (100 mg total) by mouth daily. 02/12/15  Yes Florencia Reasons, MD    Family History Family History  Problem Relation Age of Onset  . Brain cancer Maternal Grandmother   .  Coronary artery disease Unknown        no premature CAD    Social History Social History   Tobacco Use  . Smoking status: Former Smoker    Packs/day: 1.00    Years: 20.00    Pack years: 20.00    Types: Cigarettes    Last attempt to quit: 04/26/2004    Years since quitting: 12.9  . Smokeless tobacco: Never Used  . Tobacco comment: quit in 2006  Substance Use Topics  . Alcohol use: No  . Drug use: No     Allergies   Codeine and Penicillins   Review of Systems Review of Systems  Constitutional: Negative for fever.  Respiratory: Negative for shortness of breath.   Cardiovascular: Negative for chest pain.  Gastrointestinal: Negative for abdominal pain.  Musculoskeletal:       Right hip pain  Skin: Negative for wound.  All other systems reviewed and are negative.    Physical Exam Updated Vital Signs BP (!) 170/66 (BP Location: Right Arm)   Pulse 60   Temp 97.7 F (36.5 C) (Oral)   Ht 5' (1.524 m)   Wt 65.8 kg (145 lb)   SpO2 97%   BMI 28.32 kg/m   Physical Exam  Constitutional:  Elderly, chronically ill-appearing, no acute distress  HENT:  Head: Normocephalic and atraumatic.  Eyes:  Nonreactive pupil right eye  Neck: Neck supple.  No midline C-spine tenderness to palpation  Cardiovascular: Normal rate, regular rhythm and normal heart sounds.  Pulmonary/Chest: Effort normal. No respiratory distress.  Distant but clear breath sounds  Abdominal: Soft. Bowel sounds are normal. There is no tenderness.  Musculoskeletal:  Right leg foreshortened and externally rotated, no overlying skin changes, 2+ DP pulse  Neurological: She is alert.  Oriented to self  Skin: Skin is warm and dry.  Psychiatric: She has a normal mood and affect.  Nursing note and vitals reviewed.    ED Treatments / Results  Labs (all labs ordered are listed, but only abnormal results are displayed) Labs Reviewed  CBC WITH DIFFERENTIAL/PLATELET - Abnormal; Notable for the following  components:      Result Value   WBC 22.3 (*)    RDW 17.0 (*)    Platelets 148 (*)    Neutro Abs 19.8 (*)    Monocytes Absolute 1.2 (*)    All other components within normal limits  BASIC METABOLIC PANEL - Abnormal; Notable for the following components:   Glucose, Bld 131 (*)    Creatinine, Ser 1.46 (*)    GFR calc non Af Amer 32 (*)    GFR calc Af Amer 37 (*)    All other components within normal limits  URINALYSIS, ROUTINE W REFLEX MICROSCOPIC    EKG  EKG Interpretation  Date/Time:  Sunday April 17 2017 23:38:54 EST Ventricular Rate:  60 PR Interval:    QRS Duration: 86 QT Interval:  430 QTC Calculation: 430 R Axis:   45 Text Interpretation:  Sinus rhythm Probable left atrial enlargement Confirmed by Thayer Jew [phone]) on 04/17/2017 11:42:41 PM       Radiology Dg Chest 1 View  Result Date: 04/17/2017 CLINICAL DATA:  Operative radiograph the 4 right hip fracture repair EXAM: CHEST 1 VIEW COMPARISON:  None. FINDINGS: Median sternotomy wires. Cardiomegaly. Mild interstitial pulmonary edema. No pleural effusion. No pneumothorax. No focal consolidation. IMPRESSION: Cardiomegaly and mild interstitial pulmonary edema. Electronically Signed   By: Ulyses Jarred M.D.   On: 04/17/2017 23:19   Ct Head Wo Contrast  Result Date: 04/18/2017 CLINICAL DATA:  Found on floor, with concern for head injury. EXAM: CT HEAD WITHOUT CONTRAST TECHNIQUE: Contiguous axial images were obtained from the base of the skull through the vertex without intravenous contrast. COMPARISON:  CT of the head performed 11/28/2015, and MRI of the brain performed 02/10/2015 FINDINGS: Brain: No evidence of acute infarction, hemorrhage, hydrocephalus, extra-axial collection or mass lesion/mass effect. Prominence of the ventricles and sulci reflects mild cortical volume loss. Mild cerebellar atrophy is noted. Scattered periventricular and subcortical white matter change likely reflects small vessel ischemic  microangiopathy. The brainstem and fourth ventricle are within normal limits. The basal ganglia are unremarkable in appearance. The cerebral hemispheres demonstrate grossly normal gray-white differentiation. No mass effect or midline shift is seen. Vascular: No hyperdense vessel or unexpected calcification. Skull: There is no evidence of fracture; visualized osseous  structures are unremarkable in appearance. Sinuses/Orbits: The orbits are within normal limits. There is mild partial opacification of the left mastoid air cells. The paranasal sinuses and right mastoid air cells are well-aerated. Other: No significant soft tissue abnormalities are seen. IMPRESSION: 1. No acute intracranial pathology seen on CT. 2. Mild cortical volume loss and scattered small vessel ischemic microangiopathy. 3. Mild partial opacification of the left mastoid air cells. Electronically Signed   By: Garald Balding M.D.   On: 04/18/2017 00:11   Dg Hip Unilat  With Pelvis 2-3 Views Right  Result Date: 04/17/2017 CLINICAL DATA:  Fall with right hip pain. EXAM: DG HIP (WITH OR WITHOUT PELVIS) 2-3V RIGHT COMPARISON:  None. FINDINGS: There is a comminuted, medially angulated fracture of the intertrochanteric right femur. There is a medially displaced butterfly fragment inferior to the femoral neck. Left hip is normal. No pelvic fracture or diastasis. The right femoroacetabular joint remains approximated. IMPRESSION: Comminuted, medially angulated intertrochanteric fracture of the right femur. Electronically Signed   By: Ulyses Jarred M.D.   On: 04/17/2017 23:18    Procedures Procedures (including critical care time)  Medications Ordered in ED Medications  fentaNYL (SUBLIMAZE) injection 50 mcg (50 mcg Intravenous Given 04/17/17 2342)     Initial Impression / Assessment and Plan / ED Course  I have reviewed the triage vital signs and the nursing notes.  Pertinent labs & imaging results that were available during my care of the  patient were reviewed by me and considered in my medical decision making (see chart for details).     Patient presents following a fall.  History of dementia with limited history regarding mechanism of fall.  She has an obvious right lower extremity fracture.  X-rays show a comminuted intertrochanteric fracture.  Workup initiated including labs and EKG.  Discussed the case with Dr. Ninfa Linden, orthopedics.  Plan to admit to the hospitalist with orthopedics to evaluate in the morning.    Final Clinical Impressions(s) / ED Diagnoses   Final diagnoses:  Closed fracture of right hip, initial encounter [facility])    ED Discharge Orders    None       Merryl Hacker, MD 04/18/17 [id number]

## 2017-04-18 ENCOUNTER — Inpatient Hospital Stay (HOSPITAL_COMMUNITY): Payer: Medicare Other

## 2017-04-18 ENCOUNTER — Encounter (HOSPITAL_COMMUNITY): Payer: Self-pay | Admitting: *Deleted

## 2017-04-18 DIAGNOSIS — Z01818 Encounter for other preprocedural examination: Secondary | ICD-10-CM | POA: Diagnosis not present

## 2017-04-18 DIAGNOSIS — E875 Hyperkalemia: Secondary | ICD-10-CM | POA: Diagnosis not present

## 2017-04-18 DIAGNOSIS — S72001A Fracture of unspecified part of neck of right femur, initial encounter for closed fracture: Secondary | ICD-10-CM

## 2017-04-18 DIAGNOSIS — S72141A Displaced intertrochanteric fracture of right femur, initial encounter for closed fracture: Secondary | ICD-10-CM | POA: Diagnosis not present

## 2017-04-18 DIAGNOSIS — N182 Chronic kidney disease, stage 2 (mild): Secondary | ICD-10-CM | POA: Diagnosis present

## 2017-04-18 DIAGNOSIS — F0390 Unspecified dementia without behavioral disturbance: Secondary | ICD-10-CM | POA: Diagnosis present

## 2017-04-18 DIAGNOSIS — Z953 Presence of xenogenic heart valve: Secondary | ICD-10-CM | POA: Diagnosis not present

## 2017-04-18 DIAGNOSIS — I272 Pulmonary hypertension, unspecified: Secondary | ICD-10-CM | POA: Diagnosis present

## 2017-04-18 DIAGNOSIS — R0600 Dyspnea, unspecified: Secondary | ICD-10-CM | POA: Diagnosis not present

## 2017-04-18 DIAGNOSIS — Z952 Presence of prosthetic heart valve: Secondary | ICD-10-CM | POA: Diagnosis not present

## 2017-04-18 DIAGNOSIS — E669 Obesity, unspecified: Secondary | ICD-10-CM | POA: Diagnosis present

## 2017-04-18 DIAGNOSIS — J449 Chronic obstructive pulmonary disease, unspecified: Secondary | ICD-10-CM | POA: Diagnosis not present

## 2017-04-18 DIAGNOSIS — Z66 Do not resuscitate: Secondary | ICD-10-CM | POA: Diagnosis present

## 2017-04-18 DIAGNOSIS — R41841 Cognitive communication deficit: Secondary | ICD-10-CM | POA: Diagnosis not present

## 2017-04-18 DIAGNOSIS — Z79890 Hormone replacement therapy (postmenopausal): Secondary | ICD-10-CM | POA: Diagnosis not present

## 2017-04-18 DIAGNOSIS — Z7901 Long term (current) use of anticoagulants: Secondary | ICD-10-CM | POA: Diagnosis not present

## 2017-04-18 DIAGNOSIS — J440 Chronic obstructive pulmonary disease with acute lower respiratory infection: Secondary | ICD-10-CM | POA: Diagnosis not present

## 2017-04-18 DIAGNOSIS — J209 Acute bronchitis, unspecified: Secondary | ICD-10-CM | POA: Diagnosis not present

## 2017-04-18 DIAGNOSIS — Z87891 Personal history of nicotine dependence: Secondary | ICD-10-CM | POA: Diagnosis not present

## 2017-04-18 DIAGNOSIS — Y939 Activity, unspecified: Secondary | ICD-10-CM | POA: Diagnosis not present

## 2017-04-18 DIAGNOSIS — Z419 Encounter for procedure for purposes other than remedying health state, unspecified: Secondary | ICD-10-CM | POA: Diagnosis not present

## 2017-04-18 DIAGNOSIS — S72001D Fracture of unspecified part of neck of right femur, subsequent encounter for closed fracture with routine healing: Secondary | ICD-10-CM | POA: Diagnosis not present

## 2017-04-18 DIAGNOSIS — Z86711 Personal history of pulmonary embolism: Secondary | ICD-10-CM | POA: Diagnosis not present

## 2017-04-18 DIAGNOSIS — F0391 Unspecified dementia with behavioral disturbance: Secondary | ICD-10-CM | POA: Diagnosis not present

## 2017-04-18 DIAGNOSIS — K219 Gastro-esophageal reflux disease without esophagitis: Secondary | ICD-10-CM | POA: Diagnosis present

## 2017-04-18 DIAGNOSIS — Z5189 Encounter for other specified aftercare: Secondary | ICD-10-CM | POA: Diagnosis not present

## 2017-04-18 DIAGNOSIS — I129 Hypertensive chronic kidney disease with stage 1 through stage 4 chronic kidney disease, or unspecified chronic kidney disease: Secondary | ICD-10-CM | POA: Diagnosis present

## 2017-04-18 DIAGNOSIS — D72828 Other elevated white blood cell count: Secondary | ICD-10-CM | POA: Diagnosis present

## 2017-04-18 DIAGNOSIS — S72141D Displaced intertrochanteric fracture of right femur, subsequent encounter for closed fracture with routine healing: Secondary | ICD-10-CM | POA: Diagnosis not present

## 2017-04-18 DIAGNOSIS — N179 Acute kidney failure, unspecified: Secondary | ICD-10-CM | POA: Diagnosis present

## 2017-04-18 DIAGNOSIS — S72009A Fracture of unspecified part of neck of unspecified femur, initial encounter for closed fracture: Secondary | ICD-10-CM | POA: Diagnosis not present

## 2017-04-18 DIAGNOSIS — Z6828 Body mass index (BMI) 28.0-28.9, adult: Secondary | ICD-10-CM | POA: Diagnosis not present

## 2017-04-18 DIAGNOSIS — M6281 Muscle weakness (generalized): Secondary | ICD-10-CM | POA: Diagnosis not present

## 2017-04-18 DIAGNOSIS — I739 Peripheral vascular disease, unspecified: Secondary | ICD-10-CM | POA: Diagnosis present

## 2017-04-18 DIAGNOSIS — R278 Other lack of coordination: Secondary | ICD-10-CM | POA: Diagnosis not present

## 2017-04-18 DIAGNOSIS — H5461 Unqualified visual loss, right eye, normal vision left eye: Secondary | ICD-10-CM | POA: Diagnosis present

## 2017-04-18 DIAGNOSIS — J439 Emphysema, unspecified: Secondary | ICD-10-CM | POA: Diagnosis present

## 2017-04-18 DIAGNOSIS — E785 Hyperlipidemia, unspecified: Secondary | ICD-10-CM | POA: Diagnosis present

## 2017-04-18 DIAGNOSIS — Z4789 Encounter for other orthopedic aftercare: Secondary | ICD-10-CM | POA: Diagnosis not present

## 2017-04-18 DIAGNOSIS — W1830XA Fall on same level, unspecified, initial encounter: Secondary | ICD-10-CM | POA: Diagnosis present

## 2017-04-18 DIAGNOSIS — M6389 Disorders of muscle in diseases classified elsewhere, multiple sites: Secondary | ICD-10-CM | POA: Diagnosis not present

## 2017-04-18 DIAGNOSIS — D62 Acute posthemorrhagic anemia: Secondary | ICD-10-CM | POA: Diagnosis not present

## 2017-04-18 DIAGNOSIS — Z86718 Personal history of other venous thrombosis and embolism: Secondary | ICD-10-CM | POA: Diagnosis not present

## 2017-04-18 DIAGNOSIS — E039 Hypothyroidism, unspecified: Secondary | ICD-10-CM | POA: Diagnosis present

## 2017-04-18 DIAGNOSIS — Y92000 Kitchen of unspecified non-institutional (private) residence as  the place of occurrence of the external cause: Secondary | ICD-10-CM | POA: Diagnosis not present

## 2017-04-18 DIAGNOSIS — G8911 Acute pain due to trauma: Secondary | ICD-10-CM | POA: Diagnosis not present

## 2017-04-18 DIAGNOSIS — R2681 Unsteadiness on feet: Secondary | ICD-10-CM | POA: Diagnosis not present

## 2017-04-18 LAB — BRAIN NATRIURETIC PEPTIDE: B Natriuretic Peptide: 124 pg/mL — ABNORMAL HIGH (ref 0.0–100.0)

## 2017-04-18 LAB — SURGICAL PCR SCREEN
MRSA, PCR: NEGATIVE
STAPHYLOCOCCUS AUREUS: NEGATIVE

## 2017-04-18 LAB — SODIUM, URINE, RANDOM: Sodium, Ur: 113 mmol/L

## 2017-04-18 LAB — COMPREHENSIVE METABOLIC PANEL
ALBUMIN: 3 g/dL — AB (ref 3.5–5.0)
ALT: 14 U/L (ref 14–54)
AST: 26 U/L (ref 15–41)
Alkaline Phosphatase: 82 U/L (ref 38–126)
Anion gap: 7 (ref 5–15)
BILIRUBIN TOTAL: 0.7 mg/dL (ref 0.3–1.2)
BUN: 18 mg/dL (ref 6–20)
CHLORIDE: 102 mmol/L (ref 101–111)
CO2: 31 mmol/L (ref 22–32)
Calcium: 9.2 mg/dL (ref 8.9–10.3)
Creatinine, Ser: 1.41 mg/dL — ABNORMAL HIGH (ref 0.44–1.00)
GFR calc Af Amer: 39 mL/min — ABNORMAL LOW (ref >60)
GFR calc non Af Amer: 34 mL/min — ABNORMAL LOW (ref >60)
GLUCOSE: 148 mg/dL — AB (ref 65–99)
POTASSIUM: 4.6 mmol/L (ref 3.5–5.1)
Sodium: 140 mmol/L (ref 135–145)
Total Protein: 6.3 g/dL — ABNORMAL LOW (ref 6.5–8.1)

## 2017-04-18 LAB — URINALYSIS, ROUTINE W REFLEX MICROSCOPIC
Bilirubin Urine: NEGATIVE
GLUCOSE, UA: NEGATIVE mg/dL
HGB URINE DIPSTICK: NEGATIVE
KETONES UR: NEGATIVE mg/dL
Leukocytes, UA: NEGATIVE
Nitrite: NEGATIVE
PH: 5 (ref 5.0–8.0)
Protein, ur: NEGATIVE mg/dL
Specific Gravity, Urine: 1.02 (ref 1.005–1.030)

## 2017-04-18 LAB — CREATININE, URINE, RANDOM: Creatinine, Urine: 219.86 mg/dL

## 2017-04-18 MED ORDER — ADULT MULTIVITAMIN W/MINERALS CH
1.0000 | ORAL_TABLET | Freq: Every day | ORAL | Status: DC
  Administered 2017-04-18 – 2017-04-22 (×4): 1 via ORAL
  Filled 2017-04-18 (×4): qty 1

## 2017-04-18 MED ORDER — SENNA 8.6 MG PO TABS
1.0000 | ORAL_TABLET | Freq: Every day | ORAL | Status: DC
  Administered 2017-04-18 – 2017-04-22 (×4): 8.6 mg via ORAL
  Filled 2017-04-18 (×4): qty 1

## 2017-04-18 MED ORDER — METOPROLOL TARTRATE 12.5 MG HALF TABLET
12.5000 mg | ORAL_TABLET | Freq: Two times a day (BID) | ORAL | Status: DC
  Administered 2017-04-18 – 2017-04-22 (×8): 12.5 mg via ORAL
  Filled 2017-04-18 (×9): qty 1

## 2017-04-18 MED ORDER — SODIUM CHLORIDE 0.9% FLUSH
3.0000 mL | Freq: Two times a day (BID) | INTRAVENOUS | Status: DC
  Administered 2017-04-18 – 2017-04-22 (×10): 3 mL via INTRAVENOUS

## 2017-04-18 MED ORDER — PANTOPRAZOLE SODIUM 40 MG PO TBEC
40.0000 mg | DELAYED_RELEASE_TABLET | Freq: Every day | ORAL | Status: DC
  Administered 2017-04-18 – 2017-04-22 (×5): 40 mg via ORAL
  Filled 2017-04-18 (×5): qty 1

## 2017-04-18 MED ORDER — IPRATROPIUM-ALBUTEROL 0.5-2.5 (3) MG/3ML IN SOLN: 3.0000 mL | RESPIRATORY_TRACT | Status: DC | PRN

## 2017-04-18 MED ORDER — BUDESONIDE 0.5 MG/2ML IN SUSP
0.5000 mg | Freq: Two times a day (BID) | RESPIRATORY_TRACT | Status: DC
  Administered 2017-04-18 – 2017-04-22 (×8): 0.5 mg via RESPIRATORY_TRACT
  Filled 2017-04-18 (×8): qty 2

## 2017-04-18 MED ORDER — SERTRALINE HCL 50 MG PO TABS
50.0000 mg | ORAL_TABLET | Freq: Every day | ORAL | Status: DC
  Administered 2017-04-18 – 2017-04-21 (×4): 50 mg via ORAL
  Filled 2017-04-18 (×4): qty 1

## 2017-04-18 MED ORDER — ACETAMINOPHEN 500 MG PO TABS
1000.0000 mg | ORAL_TABLET | Freq: Three times a day (TID) | ORAL | Status: DC
  Administered 2017-04-18 – 2017-04-22 (×14): 1000 mg via ORAL
  Filled 2017-04-18 (×14): qty 2

## 2017-04-18 MED ORDER — LEVOTHYROXINE SODIUM 25 MCG PO TABS
137.0000 ug | ORAL_TABLET | Freq: Every day | ORAL | Status: DC
  Administered 2017-04-18 – 2017-04-22 (×5): 137 ug via ORAL
  Filled 2017-04-18 (×5): qty 1

## 2017-04-18 MED ORDER — IPRATROPIUM-ALBUTEROL 0.5-2.5 (3) MG/3ML IN SOLN
3.0000 mL | Freq: Two times a day (BID) | RESPIRATORY_TRACT | Status: DC
  Administered 2017-04-18 – 2017-04-22 (×7): 3 mL via RESPIRATORY_TRACT
  Filled 2017-04-18 (×7): qty 3

## 2017-04-18 MED ORDER — DONEPEZIL HCL 10 MG PO TABS
10.0000 mg | ORAL_TABLET | Freq: Every day | ORAL | Status: DC
  Administered 2017-04-18 – 2017-04-21 (×4): 10 mg via ORAL
  Filled 2017-04-18 (×4): qty 1

## 2017-04-18 MED ORDER — MOMETASONE FURO-FORMOTEROL FUM 100-5 MCG/ACT IN AERO
2.0000 | INHALATION_SPRAY | Freq: Two times a day (BID) | RESPIRATORY_TRACT | Status: DC
  Filled 2017-04-18: qty 8.8

## 2017-04-18 MED ORDER — HYDROMORPHONE HCL 1 MG/ML IJ SOLN
0.2000 mg | INTRAMUSCULAR | Status: DC | PRN
  Administered 2017-04-18 – 2017-04-19 (×3): 0.2 mg via INTRAVENOUS
  Filled 2017-04-18 (×3): qty 1

## 2017-04-18 MED ORDER — VITAMIN B-1 100 MG PO TABS
100.0000 mg | ORAL_TABLET | Freq: Every day | ORAL | Status: DC
  Administered 2017-04-18 – 2017-04-22 (×4): 100 mg via ORAL
  Filled 2017-04-18 (×4): qty 1

## 2017-04-18 MED ORDER — FENTANYL CITRATE (PF) 100 MCG/2ML IJ SOLN
50.0000 ug | Freq: Once | INTRAMUSCULAR | Status: AC
  Administered 2017-04-18: 50 ug via INTRAVENOUS
  Filled 2017-04-18: qty 2

## 2017-04-18 MED ORDER — OXYCODONE HCL 5 MG PO TABS
2.5000 mg | ORAL_TABLET | ORAL | Status: DC | PRN
  Administered 2017-04-18 – 2017-04-20 (×9): 2.5 mg via ORAL
  Filled 2017-04-18 (×9): qty 1

## 2017-04-18 MED ORDER — ATORVASTATIN CALCIUM 20 MG PO TABS
20.0000 mg | ORAL_TABLET | Freq: Every day | ORAL | Status: DC
  Administered 2017-04-18 – 2017-04-22 (×4): 20 mg via ORAL
  Filled 2017-04-18 (×4): qty 1

## 2017-04-18 MED ORDER — IPRATROPIUM-ALBUTEROL 0.5-2.5 (3) MG/3ML IN SOLN
3.0000 mL | Freq: Three times a day (TID) | RESPIRATORY_TRACT | Status: DC
  Administered 2017-04-18: 3 mL via RESPIRATORY_TRACT
  Filled 2017-04-18: qty 3

## 2017-04-18 MED ORDER — MEMANTINE HCL 10 MG PO TABS
10.0000 mg | ORAL_TABLET | Freq: Two times a day (BID) | ORAL | Status: DC
  Administered 2017-04-18 – 2017-04-22 (×8): 10 mg via ORAL
  Filled 2017-04-18 (×8): qty 1

## 2017-04-18 MED ORDER — LORATADINE 10 MG PO TABS
10.0000 mg | ORAL_TABLET | Freq: Every day | ORAL | Status: DC
  Administered 2017-04-18 – 2017-04-22 (×4): 10 mg via ORAL
  Filled 2017-04-18 (×4): qty 1

## 2017-04-18 NOTE — Progress Notes (Signed)
Orthopedic Tech Progress Note Patient Details:  Sarah Johnson Aug 16, 1932 [id number]  Musculoskeletal Traction Type of Traction: Bucks Skin Traction Traction Weight: 10 lbs   Post Interventions Patient Tolerated: Well Instructions Provided: Care of device   [name] 04/18/2017, 10:49 AM

## 2017-04-18 NOTE — Progress Notes (Signed)
I have seen and assessed patient and agree Dr.Kuhlman's assessment and plan.  Patient is a 83 year old female with history of mild dementia, COPD, history of DVT on anticoagulation with DOAC, bioprosthetic aortic valve replacement with aortic aneurysmal repair in 2009 presented with a full and noted to have a right hip fracture.  Patient currently in Buck's traction.  Anticoagulation on hold.  Will place on nebulizer treatments to optimize pulmonary function.  Due to patient's complex history also consult with cardiology for cardiac clearance and optimization of cardiac issues.  Patient will likely need to be placed on IV heparin in the next 24 hours due to history of DVT.  Orthopedics following and deciding on when to undergo surgery.  No charge.

## 2017-04-18 NOTE — Progress Notes (Signed)
Pt arrived on 5N, fom ED,covered in feces, and a bleeding skin blister in the middle of her sacral area. No Purewick in place. Receiving RN, cleaned Pt and applied pink sacral foam. Purewick applied and continuous pulse ox placed. Pt is resting, NPO.Will monitor for pain.

## 2017-04-18 NOTE — Progress Notes (Signed)
Patient ID: Sarah Johnson, female   DOB: [birth year], 83 y.o.   MRN: [id number] I spoke with the patient's daughter at the bedside and let her know about treatment options for her mother's broken right hip.  Given her COPD, spinal anesthesia should be considered.  Since she did take Xarelto Saturday evening, anesthesia states that it will be safer to wait until 72 hours after that dose to consider spinal anesthesia.  Thus, we will wait until this Wednesday to put her on the OR schedule to repair her fracture right hip.  The family is fully aware and agrees.

## 2017-04-18 NOTE — Consult Note (Signed)
Reason for Consult:  Right hip fracture Referring Physician:  Thayer Jew, MD  Sarah Johnson is an 83 y.o. female.  HPI: The patient is a pleasantly demented 83 year old female who presented to the emergency room last evening with acute right hip pain after an unwitnessed mechanical fall.  She had the inability to ambulate and significant right hip pain with a deformity and x-rays confirmed a displaced intertrochanteric proximal femur fracture.  Orthopedic surgery is consulted for further evaluation and treatment of this fracture.  She does have a comp gated medical history of emphysema and COPD as well as a history of a DVT in the past with a pulmonary embolus.  She is also had extensive cardiac surgery before.  She took her last dose of Xarelto Saturday.  On examination today at the bedside she is alert and follows commands appropriately and is able to answer some of my questions.  She does report right hip pain which is significant.  Past Medical History:  Diagnosis Date  . Arthritis   . Blind right eye   . Cataract    RT  . CKD (chronic kidney disease)   . COPD (chronic obstructive pulmonary disease) ([place])   . Emphysema    followed by Dr. Joya Gaskins  . GERD (gastroesophageal reflux disease)   . History of Doppler echocardiogram    carotid dopplers. (8/11) no significant stenosis  . HTN (hypertension)   . Hx of echocardiogram 2015   Echo (08/2013):  EF 60-65%, no RWMA, Gr 1 DD, AVR ok (mean 5 mmHg), mild MR, PASP 22 mmHg  . Hyperlipidemia   . Hypothyroidism   . Pulmonary embolism ([place]) 2010   She is no onger taking coumadin  . S/P aortic valve replacement with bioprosthetic valve    ehco (8/11) with EF 55-60%, mild-mod MR, mild-mod TR, poorly visualized bioprosthetic aortic valve but no significant regurgitation and gradient not significanly elevated.    . S/P thoracic aortic aneurysm repair 1/10   also with bioprosthetic aortic valve prelacement [facility]). in 10/10 she has a  descending thoraic aorta anuerysm and abdominal reapir [facility]). Vascular urgeon is Dr. Lunette Stands.   Marland Kitchen SVT (supraventricular tachycardia) (HCC)    HOLTER MONITOR AFTER SURGERY     Past Surgical History:  Procedure Laterality Date  . ANOMALOUS PULMONARY VENOUS RETURN REPAIR    . BACK SURGERY  1970   . EYE SURGERY     AS BABY- RT EYE  POOR SIGHT  . LUMBAR LAMINECTOMY/DECOMPRESSION MICRODISCECTOMY  05/31/2011   Procedure: LUMBAR LAMINECTOMY/DECOMPRESSION MICRODISCECTOMY;  Surgeon: Elaina Hoops, MD;  Location: [facility];  Service: Neurosurgery;  Laterality: Bilateral;  Bilateral Lumbar three-four decompressionj lumbar laminectomy   . THORACIC AORTIC ANEURYSM REPAIR  1/10   VALVE ALSO DONE  . THORACOABDOMINAL AORTIC ANEURYSM REPAIR  01/28/09   repair  . TONSILLECTOMY      Family History  Problem Relation Age of Onset  . Brain cancer Maternal Grandmother   . Coronary artery disease Unknown        no premature CAD    Social History:  reports that she quit smoking about 12 years ago. Her smoking use included cigarettes. She has a 20.00 pack-year smoking history. she has never used smokeless tobacco. She reports that she does not drink alcohol or use drugs.  Allergies:  Allergies  Allergen Reactions  . Codeine Other (See Comments)    Pt. States it has been too long to remember the reaction   . Penicillins  Other (See Comments)    Pt. Does not recall the reaction.  Has patient had a PCN reaction causing immediate rash, facial/tongue/throat swelling, SOB or lightheadedness with hypotension:  Has patient had a PCN reaction causing severe rash involving mucus membranes or skin necrosis: Has patient had a PCN reaction that required hospitalization  Has patient had a PCN reaction occurring within the last 10 years:  If all of the above answers are "NO", then may proceed with Cephalosporin use.     Medications: I have reviewed the patient's current medications.  Results for orders  placed or performed during the hospital encounter of 04/17/17 (from the past 48 hour(s))  CBC with Differential     Status: Abnormal   Collection Time: 04/17/17 11:19 PM  Result Value Ref Range   WBC 22.3 (H) 4.0 - 10.5 K/uL   RBC 4.50 3.87 - 5.11 MIL/uL   Hemoglobin 13.3 12.0 - 15.0 g/dL   HCT 42.7 36.0 - 46.0 %   MCV 94.9 78.0 - 100.0 fL   MCH 29.6 26.0 - 34.0 pg   MCHC 31.1 30.0 - 36.0 g/dL   RDW 17.0 (H) 11.5 - 15.5 %   Platelets 148 (L) 150 - 400 K/uL   Neutrophils Relative % 89 %   Neutro Abs 19.8 (H) 1.7 - 7.7 K/uL   Lymphocytes Relative 6 %   Lymphs Abs 1.3 0.7 - 4.0 K/uL   Monocytes Relative 5 %   Monocytes Absolute 1.2 (H) 0.1 - 1.0 K/uL   Eosinophils Relative 0 %   Eosinophils Absolute 0.0 0.0 - 0.7 K/uL   Basophils Relative 0 %   Basophils Absolute 0.0 0.0 - 0.1 K/uL  Basic metabolic panel     Status: Abnormal   Collection Time: 04/17/17 11:19 PM  Result Value Ref Range   Sodium 140 135 - 145 mmol/L   Potassium 4.3 3.5 - 5.1 mmol/L   Chloride 101 101 - 111 mmol/L   CO2 30 22 - 32 mmol/L   Glucose, Bld 131 (H) 65 - 99 mg/dL   BUN 17 6 - 20 mg/dL   Creatinine, Ser 1.46 (H) 0.44 - 1.00 mg/dL   Calcium 9.3 8.9 - 10.3 mg/dL   GFR calc non Af Amer 32 (L) >60 mL/min   GFR calc Af Amer 37 (L) >60 mL/min    Comment: (NOTE) The eGFR has been calculated using the CKD EPI equation. This calculation has not been validated in all clinical situations. eGFR's persistently <60 mL/min signify possible Chronic Kidney Disease.    Anion gap 9 5 - 15  Surgical pcr screen     Status: None   Collection Time: 04/18/17  3:00 AM  Result Value Ref Range   MRSA, PCR NEGATIVE NEGATIVE   Staphylococcus aureus NEGATIVE NEGATIVE    Comment: (NOTE) The Xpert SA Assay (FDA approved for NASAL specimens in patients 98 years of age and older), is one component of a comprehensive surveillance program. It is not intended to diagnose infection nor to guide or monitor treatment.    Comprehensive metabolic panel     Status: Abnormal   Collection Time: 04/18/17  5:26 AM  Result Value Ref Range   Sodium 140 135 - 145 mmol/L   Potassium 4.6 3.5 - 5.1 mmol/L   Chloride 102 101 - 111 mmol/L   CO2 31 22 - 32 mmol/L   Glucose, Bld 148 (H) 65 - 99 mg/dL   BUN 18 6 - 20 mg/dL   Creatinine, Ser 1.41 (  H) 0.44 - 1.00 mg/dL   Calcium 9.2 8.9 - 10.3 mg/dL   Total Protein 6.3 (L) 6.5 - 8.1 g/dL   Albumin 3.0 (L) 3.5 - 5.0 g/dL   AST 26 15 - 41 U/L   ALT 14 14 - 54 U/L   Alkaline Phosphatase 82 38 - 126 U/L   Total Bilirubin 0.7 0.3 - 1.2 mg/dL   GFR calc non Af Amer 34 (L) >60 mL/min   GFR calc Af Amer 39 (L) >60 mL/min    Comment: (NOTE) The eGFR has been calculated using the CKD EPI equation. This calculation has not been validated in all clinical situations. eGFR's persistently <60 mL/min signify possible Chronic Kidney Disease.    Anion gap 7 5 - 15  Brain natriuretic peptide     Status: Abnormal   Collection Time: 04/18/17  5:26 AM  Result Value Ref Range   B Natriuretic Peptide 124.0 (H) 0.0 - 100.0 pg/mL    Dg Chest 1 View  Result Date: 04/17/2017 CLINICAL DATA:  Operative radiograph the 4 right hip fracture repair EXAM: CHEST 1 VIEW COMPARISON:  None. FINDINGS: Median sternotomy wires. Cardiomegaly. Mild interstitial pulmonary edema. No pleural effusion. No pneumothorax. No focal consolidation. IMPRESSION: Cardiomegaly and mild interstitial pulmonary edema. Electronically Signed   By: Ulyses Jarred M.D.   On: 04/17/2017 23:19   Ct Head Wo Contrast  Result Date: 04/18/2017 CLINICAL DATA:  Found on floor, with concern for head injury. EXAM: CT HEAD WITHOUT CONTRAST TECHNIQUE: Contiguous axial images were obtained from the base of the skull through the vertex without intravenous contrast. COMPARISON:  CT of the head performed 11/28/2015, and MRI of the brain performed 02/10/2015 FINDINGS: Brain: No evidence of acute infarction, hemorrhage, hydrocephalus,  extra-axial collection or mass lesion/mass effect. Prominence of the ventricles and sulci reflects mild cortical volume loss. Mild cerebellar atrophy is noted. Scattered periventricular and subcortical white matter change likely reflects small vessel ischemic microangiopathy. The brainstem and fourth ventricle are within normal limits. The basal ganglia are unremarkable in appearance. The cerebral hemispheres demonstrate grossly normal gray-white differentiation. No mass effect or midline shift is seen. Vascular: No hyperdense vessel or unexpected calcification. Skull: There is no evidence of fracture; visualized osseous structures are unremarkable in appearance. Sinuses/Orbits: The orbits are within normal limits. There is mild partial opacification of the left mastoid air cells. The paranasal sinuses and right mastoid air cells are well-aerated. Other: No significant soft tissue abnormalities are seen. IMPRESSION: 1. No acute intracranial pathology seen on CT. 2. Mild cortical volume loss and scattered small vessel ischemic microangiopathy. 3. Mild partial opacification of the left mastoid air cells. Electronically Signed   By: Garald Balding M.D.   On: 04/18/2017 00:11   Dg Hip Unilat  With Pelvis 2-3 Views Right  Result Date: 04/17/2017 CLINICAL DATA:  Fall with right hip pain. EXAM: DG HIP (WITH OR WITHOUT PELVIS) 2-3V RIGHT COMPARISON:  None. FINDINGS: There is a comminuted, medially angulated fracture of the intertrochanteric right femur. There is a medially displaced butterfly fragment inferior to the femoral neck. Left hip is normal. No pelvic fracture or diastasis. The right femoroacetabular joint remains approximated. IMPRESSION: Comminuted, medially angulated intertrochanteric fracture of the right femur. Electronically Signed   By: Ulyses Jarred M.D.   On: 04/17/2017 23:18    ROS Blood pressure (!) 148/66, pulse 82, temperature 98.9 F (37.2 C), temperature source Oral, resp. rate 15, height  5' (1.524 m), weight 145 lb (65.8 kg), SpO2 98 %.  Physical Exam  Constitutional: She appears well-developed and well-nourished.  HENT:  Head: Normocephalic and atraumatic.  Neck: Normal range of motion.  Cardiovascular: Normal rate.  Respiratory: Effort normal.  GI: Soft.  Musculoskeletal:       Right hip: She exhibits decreased range of motion, decreased strength, tenderness, bony tenderness and deformity.  Neurological: She is alert.  Skin: Skin is warm and dry.  Psychiatric: She has a normal mood and affect.    Assessment/Plan: Right hip with displaced intertrochanteric proximal femur fracture  Given the fact that she has been on Xarelto and took it Saturday we need to wait at least until tomorrow or Wednesday to safely proceed with surgery from a bleeding standpoint as well as to give the potential for spinal anesthesia.  With elective hip or knee surgery the anesthesiologists usually like the patient to be off of medication like Xarelto for a full 2-3 days.  We will have the [employer] supplied 10 pounds of [place] traction to her right leg today.  She can have a regular diet as well.  We will then make a decision for not proceeding with surgery tomorrow on Christmas day versus more likely Wednesday.  This will also allow for optimal medical management.  Mcarthur Rossetti 04/18/2017, 7:47 AM

## 2017-04-18 NOTE — H&P (Signed)
History and Physical   Sarah Johnson KAJ:[id number] DOB: 08/17/1932 DOA: 04/17/2017  PCP: Jani Gravel, MD  Chief Complaint: Fall  HPI:  This is an 83 year old woman with mild dementia, COPD, history of DVT on anticoagulation with DOAC, bioprosthetic aortic valve replacement with aortic aneurysm repair in 2009, presenting with a fall.  Fall occurred on the day before admission, currently the patient's dining area. Patient is unable to recall events of the fall. She reports she cannot remember what happened. The patient's 2 daughters are present in assist with collateral information, 1 daughter reports that she calls the patient every 2 hours during the day and when she didn't answer she thought the patient might be in trouble. The daughter went to evaluate the situation found her mother down on the ground in pain. The patient reports current pain level is 4 out of 10, denies any chest pain shortness of breath nausea vomiting or diarrhea.  No syncopal event recalled.  At baseline the patient lives alone, does not use any assist devices to walk. She does need assistance with her IADLs, reports being independent in her ADLs. She does not drink, she is a former smoker. She enjoys spending time with family.  ED Course: The emergency department vital signs were normal for systolic blood pressures in the 170, normal heart rate, CT of the head did not show any acute enterotomies, diagnoses including x-ray of the right hip revealed acute intertrochanteric right fracture of the femur. Diagnostics revealed creatinine of 1.46 up from baseline, white blood cell count with leukocytosis to 22,000, predominance of neutrophils. Pain patient was given some IV fentanyl, case was discussed with orthopedic team recommended admission to the hospital medicine service with orthopedic evaluation the morning for consideration of operative intervention.  EKG without acute ST changes.  Review of Systems: A complete ROS was  obtained; pertinent positives negatives are denoted in the HPI. Otherwise, all systems are negative.   Past Medical History:  Diagnosis Date  . Arthritis   . Blind right eye   . Cataract    RT  . CKD (chronic kidney disease)   . COPD (chronic obstructive pulmonary disease) ([place])   . Emphysema    followed by Dr. Joya Gaskins  . GERD (gastroesophageal reflux disease)   . History of Doppler echocardiogram    carotid dopplers. (8/11) no significant stenosis  . HTN (hypertension)   . Hx of echocardiogram 2015   Echo (08/2013):  EF 60-65%, no RWMA, Gr 1 DD, AVR ok (mean 5 mmHg), mild MR, PASP 22 mmHg  . Hyperlipidemia   . Hypothyroidism   . Pulmonary embolism ([place]) 2010   She is no onger taking coumadin  . S/P aortic valve replacement with bioprosthetic valve    ehco (8/11) with EF 55-60%, mild-mod MR, mild-mod TR, poorly visualized bioprosthetic aortic valve but no significant regurgitation and gradient not significanly elevated.    . S/P thoracic aortic aneurysm repair 1/10   also with bioprosthetic aortic valve prelacement [facility]). in 10/10 she has a descending thoraic aorta anuerysm and abdominal reapir [facility]). Vascular urgeon is Dr. Lunette Stands.   Marland Kitchen SVT (supraventricular tachycardia) (HCC)    HOLTER MONITOR AFTER SURGERY    Social History   Socioeconomic History  . Marital status: Married    Spouse name: Not on file  . Number of children: 3  . Years of education: Not on file  . Highest education level: Not on file  Social Needs  . Financial resource strain: Not  on file  . Food insecurity - worry: Not on file  . Food insecurity - inability: Not on file  . Transportation needs - medical: Not on file  . Transportation needs - non-medical: Not on file  Occupational History  . Occupation: Retired  Tobacco Use  . Smoking status: Former Smoker    Packs/day: 1.00    Years: 20.00    Pack years: 20.00    Types: Cigarettes    Last attempt to quit: 04/26/2004    Years since  quitting: 12.9  . Smokeless tobacco: Never Used  . Tobacco comment: quit in 2006  Substance and Sexual Activity  . Alcohol use: No  . Drug use: No  . Sexual activity: Yes    Birth control/protection: Post-menopausal  Other Topics Concern  . 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 Brain cancer Maternal Grandmother   . Coronary artery disease Unknown        no premature CAD    Physical Exam: Vitals:   04/17/17 2229 04/17/17 2230 04/17/17 2232  BP:   (!) 170/66  Pulse:  64 60  Temp:   97.7 F (36.5 C)  TempSrc:   Oral  SpO2: 95% 98% 97%  Weight:   65.8 kg (145 lb)  Height:   5' (1.524 m)   General: Elderly obese white woman, mild discomfort ENT: Grossly normal hearing, MMM. Cardiovascular: RRR. No M/R/G. No LE edema. Unable to appreciated JVD. Respiratory: CTA bilaterally to anterior lung fields. No wheezes or crackles. Normal respiratory effort. Breathing 2 L [place] O2. Abdomen: Soft, non-tender. Skin: No rash or induration seen on limited exam. Skin thinning present. Musculoskeletal: Grossly normal tone BUE/BLE. R hip externally rotated.   Psychiatric: Grossly normal mood and affect. Neurologic: alert, not able to provide historical details.  I have personally reviewed the following labs, culture data, and imaging studies.  Assessment/Plan:  Acute right femur fracture Occurred in setting of unwitnessed fall.  Patient's only symptom reported (although potentially limited / confounded by presence of dementia in setting of acute illness) is pain. Plan: *will keep NPO and await for orthopedic consult team input in AM with regards to surgical candidacy *pain control with scheduled acetaminophen, as needed low dose PO and IV opioids (hydromorphone chosen in lieu of morphine due to AKI presence)  Other problems -AKI: Cr of  1.4 on admission, uncertain if this is pre-renal versus volume overloaded in setting of HF; clinically, difficult to determine volume status given presence of obesity; however, CXR hints at component of volume overload; with regard to AKI, will procure urine studies to help distinguish pre-renal from post-renal -Hx of aortic valve replacement (bioprosthetic) and Possible heart failure, uncertain type: procuring BNP, repeat TTE -HTN: continuing home BB -Hx of PAD: continue statin -COPD: continue home inhalers, O2 as needed -Hx of DVT: occurred > 6 months ago, last dose of Xarelto was on 04/16/2017 evening  DVT prophylaxis: SCDs in anticipation for potential surgical procedure in AM Code Status: DNR/DNI  Disposition Plan: Anticipate D/C home in 2-5 days Consults called: orthopedic team consulted in the emergency department Admission status: admit to hospital medicine team   Cheri Rous, MD [facility] Page:[phone]  If 7PM-7AM, please contact night-coverage [email or web address] Password [email or web address]

## 2017-04-18 NOTE — Plan of Care (Signed)
  Nutrition: Adequate nutrition will be maintained 04/18/2017 1047 - Progressing by Williams Che, RN   Coping: Level of anxiety will decrease 04/18/2017 1047 - Progressing by Williams Che, RN   Elimination: Will not experience complications related to bowel motility 04/18/2017 1047 - Progressing by Williams Che, RN   Pain Managment: General experience of comfort will improve 04/18/2017 1047 - Progressing by Williams Che, RN

## 2017-04-18 NOTE — Consult Note (Signed)
Cardiology Consultation:   Patient ID: Sarah Johnson; [id number]; 12-Dec-1932   Admit date: 04/17/2017 Date of Consult: 04/18/2017  Primary Care Provider: Jani Gravel, MD Primary Cardiologist: Dr. Aundra Dubin Primary Electrophysiologist:  N/A   Patient Profile:   Sarah Johnson is a 83 y.o. female with a hx of mild dementia, COPD, bioprosthetic aortic valve, and extensive surgery for aortic aneurysm repair involving ascending, descending and abdominal aorta in 2010 who is being seen today for the preoperative clearance at the request of Dr. Grandville Silos after R femur fracture.  History of Present Illness:   Sarah Johnson with PMH of mild dementia, COPD, bioprosthetic aortic valve, and extensive surgery for aortic aneurysm repair involving ascending, descending and abdominal aorta in 2010.  She had AVR and ascending thoracic aortic aneurysm repair in January 2010.  In October 2010, she had a repair of the descending thoracic and abdominal aorta.  This was done by Dr. Lunette Stands of [facility].  She was last seen by Richardson Dopp PA-C on 09/04/2013, a repeat echocardiogram was done on 09/20/2013 which showed EF 60-65%, grade 1 DD, bioprosthetic aortic valve was peak gradient 10 mmHg, mean gradient 5 mmHg, mild MR.  She has not been followed by cardiology service since.  Patient presents to [facility] last night after a unwitnessed mechanical fall.  Initial white blood cell count was 22.3.  Right hip x-ray obtained last night showed comminuted medially angulated intertrochanteric fracture of the right femur.  CT of the head was negative for acute process.  Orthopedic surgery is currently on board and waiting for decision to proceed with surgery.  Cardiology service was consulted for preoperative clearance.  According to the patient, she is fairly independent when comes to her ADL.  She cook for herself and manage her morning medications.  She does have a caretaker who goes to her house on weekly basis  to clean.  She has 2 daughters who calls her on a daily basis to check up on her.  She has some degree of dementia, she cannot remember her date of birth, her current location and president, however she think the years in the 5s.  She also cannot remember why she presented to the hospital until I reminded her.    Past Medical History:  Diagnosis Date  . Arthritis   . Blind right eye   . Cataract    RT  . CKD (chronic kidney disease)   . COPD (chronic obstructive pulmonary disease) ([place])   . Emphysema    followed by Dr. Joya Gaskins  . GERD (gastroesophageal reflux disease)   . History of Doppler echocardiogram    carotid dopplers. (8/11) no significant stenosis  . HTN (hypertension)   . Hx of echocardiogram 2015   Echo (08/2013):  EF 60-65%, no RWMA, Gr 1 DD, AVR ok (mean 5 mmHg), mild MR, PASP 22 mmHg  . Hyperlipidemia   . Hypothyroidism   . Pulmonary embolism ([place]) 2010   She is no onger taking coumadin  . S/P aortic valve replacement with bioprosthetic valve    ehco (8/11) with EF 55-60%, mild-mod MR, mild-mod TR, poorly visualized bioprosthetic aortic valve but no significant regurgitation and gradient not significanly elevated.    . S/P thoracic aortic aneurysm repair 1/10   also with bioprosthetic aortic valve prelacement [facility]). in 10/10 she has a descending thoraic aorta anuerysm and abdominal reapir [facility]). Vascular urgeon is Dr. Lunette Stands.   Marland Kitchen SVT (supraventricular tachycardia) (H[facility])    HOLTER MONITOR  AFTER SURGERY     Past Surgical History:  Procedure Laterality Date  . ANOMALOUS PULMONARY VENOUS RETURN REPAIR    . BACK SURGERY  1970   . EYE SURGERY     AS BABY- RT EYE  POOR SIGHT  . LUMBAR LAMINECTOMY/DECOMPRESSION MICRODISCECTOMY  05/31/2011   Procedure: LUMBAR LAMINECTOMY/DECOMPRESSION MICRODISCECTOMY;  Surgeon: Elaina Hoops, MD;  Location: [facility];  Service: Neurosurgery;  Laterality: Bilateral;  Bilateral Lumbar three-four decompressionj lumbar  laminectomy   . THORACIC AORTIC ANEURYSM REPAIR  1/10   VALVE ALSO DONE  . THORACOABDOMINAL AORTIC ANEURYSM REPAIR  01/28/09   repair  . TONSILLECTOMY       Home Medications:  Prior to Admission medications   Medication Sig Start Date End Date Taking? Authorizing Provider  atorvastatin (LIPITOR) 20 MG tablet TAKE 1 TABLET BY MOUTH EVERY DAY 09/20/14  Yes Larey Dresser, MD  donepezil (ARICEPT) 10 MG tablet TAKE 1 TABLET (10 MG TOTAL) BY MOUTH AT BEDTIME. 10/19/14  Yes Star Age, MD  ibuprofen (ADVIL,MOTRIN) 200 MG tablet Take 2 tablets (400 mg total) by mouth daily as needed for moderate pain. 02/12/15  Yes Florencia Reasons, MD  ipratropium-albuterol (DUONEB) 0.5-2.5 (3) MG/3ML SOLN Take 3 mLs by nebulization 2 (two) times daily. Dx: J44.9 Patient taking differently: Take 3 mLs by nebulization every 4 (four) hours as needed (SOB). Dx: J44.9 08/13/16  Yes Parrett, Tammy S, NP  L-Methylfolate-B12-B6-B2 (CEREFOLIN PO) Take 1 tablet by mouth daily.   Yes [provider]  levothyroxine (SYNTHROID, LEVOTHROID) 112 MCG tablet Take 137 mcg by mouth daily.  12/24/14  Yes [provider]  memantine (NAMENDA) 10 MG tablet Take 10 mg by mouth 2 (two) times daily.  09/01/15  Yes [provider]  metoprolol tartrate (LOPRESSOR) 25 MG tablet Take 0.5 tablets (12.5 mg total) by mouth 2 (two) times daily. 02/12/15  Yes Florencia Reasons, MD  mometasone-formoterol (DULERA) 100-5 MCG/ACT AERO INHALE 2 PUFFS INTO THE LUNGS 2 TIMES DAILY. RINSE MOUTH AFTER EACH USE 09/17/15  Yes Parrett, Tammy S, NP  Multiple Vitamin (MULITIVITAMIN WITH MINERALS) TABS Take 1 tablet by mouth daily.   Yes [provider]  PROAIR HFA 108 (90 BASE) MCG/ACT inhaler Inhale 2 puffs into the lungs every 6 (six) hours as needed. Patient taking differently: Inhale 2 puffs into the lungs every 6 (six) hours as needed for wheezing or shortness of breath.  05/08/14  Yes Elsie Stain, MD  Rivaroxaban (XARELTO) 15 MG TABS  tablet Take 1 tablet (15 mg total) by mouth 2 (two) times daily with a meal. Patient taking differently: Take 15 mg by mouth daily with supper.  02/12/15 08/12/17 Yes Florencia Reasons, MD  saccharomyces boulardii (FLORASTOR) 250 MG capsule Take 1 capsule (250 mg total) by mouth 2 (two) times daily. 03/29/14  Yes Robbie Lis, MD  sertraline (ZOLOFT) 50 MG tablet Take 50 mg by mouth at bedtime.  07/19/13  Yes [provider]  thiamine 100 MG tablet Take 1 tablet (100 mg total) by mouth daily. 02/12/15  Yes Florencia Reasons, MD    Inpatient Medications: Scheduled Meds: . acetaminophen  1,000 mg Oral TID  . atorvastatin  20 mg Oral Daily  . budesonide (PULMICORT) nebulizer solution  0.5 mg Nebulization BID  . donepezil  10 mg Oral QHS  . ipratropium-albuterol  3 mL Nebulization TID  . levothyroxine  137 mcg Oral QAC breakfast  . loratadine  10 mg Oral Daily  . memantine  10  mg Oral BID  . metoprolol tartrate  12.5 mg Oral BID  . multivitamin with minerals  1 tablet Oral Daily  . pantoprazole  40 mg Oral Q0600  . senna  1 tablet Oral Daily  . sertraline  50 mg Oral QHS  . sodium chloride flush  3 mL Intravenous Q12H  . thiamine  100 mg Oral Daily   Continuous Infusions:  PRN Meds: HYDROmorphone (DILAUDID) injection, ipratropium-albuterol, oxyCODONE  Allergies:    Allergies  Allergen Reactions  . Codeine Other (See Comments)    Pt. States it has been too long to remember the reaction   . Penicillins Other (See Comments)    Pt. Does not recall the reaction.  Has patient had a PCN reaction causing immediate rash, facial/tongue/throat swelling, SOB or lightheadedness with hypotension:  Has patient had a PCN reaction causing severe rash involving mucus membranes or skin necrosis: Has patient had a PCN reaction that required hospitalization  Has patient had a PCN reaction occurring within the last 10 years:  If all of the above answers are "NO", then may proceed with Cephalosporin use.      Social History:   Social History   Socioeconomic History  . Marital status: Married    Spouse name: Not on file  . Number of children: 3  . Years of education: Not on file  . Highest education level: Not on file  Social Needs  . Financial resource strain: Not on file  . Food insecurity - worry: Not on file  . Food insecurity - inability: Not on file  . Transportation needs - medical: Not on file  . Transportation needs - non-medical: Not on file  Occupational History  . Occupation: Retired  Tobacco Use  . Smoking status: Former Smoker    Packs/day: 1.00    Years: 20.00    Pack years: 20.00    Types: Cigarettes    Last attempt to quit: 04/26/2004    Years since quitting: 12.9  . Smokeless tobacco: Never Used  . Tobacco comment: quit in 2006  Substance and Sexual Activity  . Alcohol use: No  . Drug use: No  . Sexual activity: Yes    Birth control/protection: Post-menopausal  Other Topics Concern  . 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 Brain cancer Maternal Grandmother   . Coronary artery disease Unknown        no premature CAD     ROS:  Please see the history of present illness.  ROS  All other ROS reviewed and negative.     Physical Exam/Data:   Vitals:   04/18/17 0145 04/18/17 0240 04/18/17 0530 04/18/17 0929  BP:  (!) 171/58 (!) 148/66   Pulse: 69 74 82   Resp: 15 14 15    Temp:  98.6 F (37 C) 98.9 F (37.2 C)   TempSrc:  Axillary Oral   SpO2: 99% 96% 98% 97%  Weight:      Height:       No intake or output data in the 24 hours ending 04/18/17 1049 Filed Weights   04/17/17 2232  Weight: 145 lb (65.8 kg)   Body mass index is 28.32 kg/m.  General:  Well nourished, well developed, in no acute distress HEENT: normal Lymph: no adenopathy Neck: no JVD  Endocrine:  No  thryomegaly Vascular: No carotid bruits; FA pulses 2+ bilaterally without bruits  Cardiac:  normal S1, S2; RRR;  2/6 systolic murmur at apex Lungs:  clear to auscultation bilaterally, no wheezing, rhonchi or rales  Abd: soft, nontender, no hepatomegaly  Ext: no edema Musculoskeletal:  R lower extremity immobilized  Skin: warm and dry  Neuro:  CNs 2-12 intact, no focal abnormalities noted Psych:  Normal affect   EKG:  The EKG was personally reviewed and demonstrates: Normal sinus rhythm without significant ST-T wave changes Telemetry:  Telemetry was personally reviewed and demonstrates:  Not on tele  Relevant CV Studies:  Echo 09/20/2013 LV EF: 60% -  65%  ------------------------------------------------------------------- Indications:   Aortic stenosis 424.1.  ------------------------------------------------------------------- History:  PMH:  Aortic valve disease. Chronic obstructive pulmonary disease. Risk factors: Hypertension. Dyslipidemia.  ------------------------------------------------------------------- Study Conclusions  - Left ventricle: The cavity size was normal. Wall thickness was normal. Systolic function was normal. The estimated ejection fraction was in the range of 60% to 65%. Wall motion was normal; there were no regional wall motion abnormalities. Doppler parameters are consistent with abnormal left ventricular relaxation (grade 1 diastolic dysfunction). The E/e&' ratio is <8, suggesting normal LV filling presure. - Aortic valve: Bioprosthetic aortic valve. Well seated. Normal leaflet motion. Peak and mean gradients of 10 and 5 mmHg. There was no regurgitation. - Mitral valve: There was mild regurgitation, posteriorly directed. - Atrial septum: No defect or patent foramen ovale was identified. - Pulmonary arteries: PA peak pressure: 22 mm Hg (S).  Impressions:  - LVEF 60-65%, normal wall thickness. Mild diastolic  dysfunction, normal LV fililng pressure. Bioprosthetic aortic valve with gradients of 10 and 5 mmHg.  Laboratory Data:  Chemistry Recent Labs  Lab 04/17/17 2319 04/18/17 0526  NA 140 140  K 4.3 4.6  CL 101 102  CO2 30 31  GLUCOSE 131* 148*  BUN 17 18  CREATININE 1.46* 1.41*  CALCIUM 9.3 9.2  GFRNONAA 32* 34*  GFRAA 37* 39*  ANIONGAP 9 7    Recent Labs  Lab 04/18/17 0526  PROT 6.3*  ALBUMIN 3.0*  AST 26  ALT 14  ALKPHOS 82  BILITOT 0.7   Hematology Recent Labs  Lab 04/17/17 2319  WBC 22.3*  RBC 4.50  HGB 13.3  HCT 42.7  MCV 94.9  MCH 29.6  MCHC 31.1  RDW 17.0*  PLT 148*   Cardiac EnzymesNo results for input(s): TROPONINI in the last 168 hours. No results for input(s): TROPIPOC in the last 168 hours.  BNP Recent Labs  Lab 04/18/17 0526  BNP 124.0*    DDimer No results for input(s): DDIMER in the last 168 hours.  Radiology/Studies:  Dg Chest 1 View  Result Date: 04/17/2017 CLINICAL DATA:  Operative radiograph the 4 right hip fracture repair EXAM: CHEST 1 VIEW COMPARISON:  None. FINDINGS: Median sternotomy wires. Cardiomegaly. Mild interstitial pulmonary edema. No pleural effusion. No pneumothorax. No focal consolidation. IMPRESSION: Cardiomegaly and mild interstitial pulmonary edema. Electronically Signed   By: Ulyses Jarred M.D.   On: 04/17/2017 23:19   Ct Head Wo Contrast  Result Date: 04/18/2017 CLINICAL DATA:  Found on floor, with concern for head injury. EXAM: CT HEAD WITHOUT CONTRAST TECHNIQUE: Contiguous axial images were obtained from the base of the skull through the vertex without intravenous contrast. COMPARISON:  CT of the head performed 11/28/2015, and MRI of the brain performed 02/10/2015 FINDINGS: Brain: No evidence of acute infarction, hemorrhage, hydrocephalus, extra-axial collection or mass lesion/mass effect.  Prominence of the ventricles and sulci reflects mild cortical volume loss. Mild cerebellar atrophy is noted. Scattered  periventricular and subcortical white matter change likely reflects small vessel ischemic microangiopathy. The brainstem and fourth ventricle are within normal limits. The basal ganglia are unremarkable in appearance. The cerebral hemispheres demonstrate grossly normal gray-white differentiation. No mass effect or midline shift is seen. Vascular: No hyperdense vessel or unexpected calcification. Skull: There is no evidence of fracture; visualized osseous structures are unremarkable in appearance. Sinuses/Orbits: The orbits are within normal limits. There is mild partial opacification of the left mastoid air cells. The paranasal sinuses and right mastoid air cells are well-aerated. Other: No significant soft tissue abnormalities are seen. IMPRESSION: 1. No acute intracranial pathology seen on CT. 2. Mild cortical volume loss and scattered small vessel ischemic microangiopathy. 3. Mild partial opacification of the left mastoid air cells. Electronically Signed   By: Garald Balding M.D.   On: 04/18/2017 00:11   Dg Hip Unilat  With Pelvis 2-3 Views Right  Result Date: 04/17/2017 CLINICAL DATA:  Fall with right hip pain. EXAM: DG HIP (WITH OR WITHOUT PELVIS) 2-3V RIGHT COMPARISON:  None. FINDINGS: There is a comminuted, medially angulated fracture of the intertrochanteric right femur. There is a medially displaced butterfly fragment inferior to the femoral neck. Left hip is normal. No pelvic fracture or diastasis. The right femoroacetabular joint remains approximated. IMPRESSION: Comminuted, medially angulated intertrochanteric fracture of the right femur. Electronically Signed   By: Ulyses Jarred M.D.   On: 04/17/2017 23:18    Assessment and Plan:   1. Preoperative clearance after right hip fracture: Patient denies any recent exertional chest pain or shortness of breath.  In fact she says she never had chest pain.  I am unable to locate the previous cath report prior to bioprosthetic aortic valve surgery.   Given lack of chest discomfort, I have discussed with Dr. Alvester Chou, we recommended a echocardiogram for today, if EF is stable and no significant valve issue.  She is cleared to proceed with surgery as low risk patient.  2. History of bioprosthetic AVR: This is in conjunction with aortic root replacement.  Done by Dr. Lunette Stands of [facility].  3. Extensive aortic repair including aortic root in January 2010 and descending aorta and the abdominal aorta in October 2010.   For questions or updates, please contact [place] Please consult [email or web address] for contact info under Cardiology/STEMI.   Hilbert Corrigan, [place]  04/18/2017 10:49 AM

## 2017-04-18 NOTE — Progress Notes (Signed)
I spoke on the phone with the patient's daughter regarding the plan of treatment.  We discussed the risks, benefits, and alternatives to surgery which is necessary for quality of life and ability to be ambulatory.  Surgery is scheduled for Wednesday to allow the Xarelto to fully metabolize so that hopefully she can be a candidate for spinal anesthesia if deemed appropriate by the anesthesiologist.  Questions encouraged and answered today.  I will be assuming her orthopedic care.    Azucena Cecil, MD [place] 4:27 PM

## 2017-04-18 NOTE — Progress Notes (Signed)
  Echocardiogram 2D Echocardiogram has been performed.  Sarah Johnson 04/18/2017, 2:44 PM

## 2017-04-19 DIAGNOSIS — J440 Chronic obstructive pulmonary disease with acute lower respiratory infection: Secondary | ICD-10-CM

## 2017-04-19 DIAGNOSIS — F0391 Unspecified dementia with behavioral disturbance: Secondary | ICD-10-CM

## 2017-04-19 DIAGNOSIS — J209 Acute bronchitis, unspecified: Secondary | ICD-10-CM

## 2017-04-19 LAB — BASIC METABOLIC PANEL
Anion gap: 9 (ref 5–15)
BUN: 19 mg/dL (ref 6–20)
CHLORIDE: 98 mmol/L — AB (ref 101–111)
CO2: 28 mmol/L (ref 22–32)
CREATININE: 1.33 mg/dL — AB (ref 0.44–1.00)
Calcium: 9.1 mg/dL (ref 8.9–10.3)
GFR calc Af Amer: 42 mL/min — ABNORMAL LOW (ref >60)
GFR calc non Af Amer: 36 mL/min — ABNORMAL LOW (ref >60)
Glucose, Bld: 96 mg/dL (ref 65–99)
Potassium: 5 mmol/L (ref 3.5–5.1)
SODIUM: 135 mmol/L (ref 135–145)

## 2017-04-19 LAB — CBC
HCT: 37.9 % (ref 36.0–46.0)
HEMOGLOBIN: 11.8 g/dL — AB (ref 12.0–15.0)
MCH: 29.5 pg (ref 26.0–34.0)
MCHC: 31.1 g/dL (ref 30.0–36.0)
MCV: 94.8 fL (ref 78.0–100.0)
Platelets: 93 10*3/uL — ABNORMAL LOW (ref 150–400)
RBC: 4 MIL/uL (ref 3.87–5.11)
RDW: 16.9 % — ABNORMAL HIGH (ref 11.5–15.5)
WBC: 17.6 10*3/uL — ABNORMAL HIGH (ref 4.0–10.5)

## 2017-04-19 LAB — URINE CULTURE: CULTURE: NO GROWTH

## 2017-04-19 LAB — MAGNESIUM: MAGNESIUM: 1.7 mg/dL (ref 1.7–2.4)

## 2017-04-19 MED ORDER — TRANEXAMIC ACID 1000 MG/10ML IV SOLN
1000.0000 mg | INTRAVENOUS | Status: DC
  Filled 2017-04-19: qty 10

## 2017-04-19 MED ORDER — MAGNESIUM SULFATE 4 GM/100ML IV SOLN
4.0000 g | Freq: Once | INTRAVENOUS | Status: AC
  Administered 2017-04-19: 4 g via INTRAVENOUS
  Filled 2017-04-19: qty 100

## 2017-04-19 NOTE — Progress Notes (Addendum)
Note Echo report below:  LV EF: 60% -   65% Study Conclusions  - Left ventricle: The cavity size was normal. There was mild   concentric hypertrophy. Systolic function was normal. The   estimated ejection fraction was in the range of 60% to 65%. Wall   motion was normal; there were no regional wall motion   abnormalities. - Pulmonary arteries: PA peak pressure: 37 mm Hg (S).  Impressions:  - The right ventricular systolic pressure was increased consistent   with mild pulmonary hypertension.  Aortic valve:   Trileaflet; normal thickness leaflets. Mobility was not restricted.  Doppler:  Transvalvular velocity was within the normal range. There was no stenosis. There was no regurgitation   Result reassuring, no further cardiac workup planned.   As per prior note, low to intermediate risk for intermediate risk procedure. Cardiology to sign off. Please do not hesitate to call back with questions.  Allegra Lai, MD

## 2017-04-19 NOTE — Plan of Care (Signed)
  Nutrition: Adequate nutrition will be maintained 04/19/2017 1302 - Progressing by Williams Che, RN   Coping: Level of anxiety will decrease 04/19/2017 1302 - Progressing by Williams Che, RN   Pain Managment: General experience of comfort will improve 04/19/2017 1302 - Progressing by Williams Che, RN

## 2017-04-19 NOTE — Progress Notes (Addendum)
PROGRESS NOTE    Sarah Johnson  IHK:[id number] DOB: 07-31-1932 DOA: 04/17/2017 PCP: Jani Gravel, MD   Brief Narrative:  Patient is a 83 year old female, history of mild dementia, COPD, history of DVT on anticoagulation with toe walk, aneurysmal repair in 2009 presented with a fall and noted to have a right intertrochanteric hip fracture.  Orthopedics and cardiology consulted.   Assessment & Plan:   Active Problems:   History of aortic valve replacement   Hip fracture [facility])   Preoperative evaluation of a medical condition to rule out surgical contraindications (TAR required)  #1 right intertrochanteric hip fracture Secondary to mechanical fall.  Patient has been seen in consultation by orthopedics and currently in Buck's traction.  Patient has been seen by cardiology and feel patient will be okay to undergo surgery with no further ischemic workup needed.  Orthopedics planning on surgical repair likely tomorrow or the day after.  Continue pain management.  Per orthopedics.  2.  History of aortic valve replacement (bioprosthetic) Repeat 2D echo with right ventricular systolic pressure increased consistent with mild pulmonary hypertension.  Estimated EF of 60-65%.  No wall motion abnormalities.  PA peak pressure of 37 mmHg.  Patient has been seen in consultation by cardiology.  Continue cardiac regimen of Lipitor, Lopressor.  Cardiology following.  3.  Leukocytosis Likely a reactive leukocytosis.  Urinalysis unremarkable.  Urine cultures negative.  X-rays negative for any infiltrate.  She is afebrile.  No need for antibiotics at this time.  Follow.  4.  Hypertension Continue home regimen beta-blocker.  5.  History of peripheral arterial disease Stable.  Diet modification.  Continue statin.  6.  COPD Stable.  Continue scheduled bronchodilators.  Oxygen as needed.  7.  History of DVT Occurred greater than 6 months ago.  Patient was on Xarelto with last dose 04/16/2017.  Xarelto on  hold.  Follow.   DVT prophylaxis: SCDs Code Status: DNR Family Communication: Updated patient and daughters at bedside. Disposition Plan: Pending surgical repair per orthopedics.  Likely skilled nursing facility versus home with home health.   Consultants:   Orthopedics: Dr. Ninfa Linden 04/18/2017  Cardiology: Dr. Gwenlyn Found 04/18/2017  Procedures:   2D echo 04/18/2017  Chest x-ray 04/17/2017  CT head without contrast 04/17/2017  Plain films of the right hip and pelvis 04/17/2017  Antimicrobials:   None   Subjective: Laying in bed.  Denies any chest pain or shortness of breath.  Eating a popsicle.  Objective: Vitals:   04/19/17 0429 04/19/17 0828 04/19/17 0835 04/19/17 1317  BP: (!) 129/58   119/62  Pulse: 71   92  Resp:    18  Temp: 99.2 F (37.3 C)   98.3 F (36.8 C)  TempSrc: Oral   Oral  SpO2: 95% 90% 97% 93%  Weight: 82.3 kg (181 lb 7 oz)     Height:       No intake or output data in the 24 hours ending 04/19/17 1708 Filed Weights   04/17/17 2232 04/19/17 0429  Weight: 65.8 kg (145 lb) 82.3 kg (181 lb 7 oz)    Examination:  General exam: Appears calm and comfortable  Respiratory system: Clear to auscultation anterior lung fields.  No crackles, no rhonchi, no wheezing. Respiratory effort normal. Cardiovascular system: S1 & S2 heard, RRR. No JVD, murmurs, rubs, gallops or clicks. No pedal edema. Gastrointestinal system: Abdomen is nondistended, soft and nontender. No organomegaly or masses felt. Normal bowel sounds heard. Central nervous system: Alert and oriented. No focal neurological  deficits. Extremities: Buck's traction in place. Skin: No rashes, lesions or ulcers Psychiatry: Judgement and insight appear normal. Mood & affect appropriate.     Data Reviewed: I have personally reviewed following labs and imaging studies  CBC: Recent Labs  Lab 04/17/17 2319 04/19/17 0715  WBC 22.3* 17.6*  NEUTROABS 19.8*  --   HGB 13.3 11.8*  HCT 42.7 37.9    MCV 94.9 94.8  PLT 148* 93*   Basic Metabolic Panel: Recent Labs  Lab 04/17/17 2319 04/18/17 0526 04/19/17 0715  NA 140 140 135  K 4.3 4.6 5.0  CL 101 102 98*  CO2 30 31 28   GLUCOSE 131* 148* 96  BUN 17 18 19   CREATININE 1.46* 1.41* 1.33*  CALCIUM 9.3 9.2 9.1  MG  --   --  1.7   GFR: Estimated Creatinine Clearance: 31 mL/min (A) (by C-G formula based on SCr of 1.33 mg/dL (H)). Liver Function Tests: Recent Labs  Lab 04/18/17 0526  AST 26  ALT 14  ALKPHOS 82  BILITOT 0.7  PROT 6.3*  ALBUMIN 3.0*   No results for input(s): LIPASE, AMYLASE in the last 168 hours. No results for input(s): AMMONIA in the last 168 hours. Coagulation Profile: No results for input(s): INR, PROTIME in the last 168 hours. Cardiac Enzymes: No results for input(s): CKTOTAL, CKMB, CKMBINDEX, TROPONINI in the last 168 hours. BNP (last 3 results) No results for input(s): PROBNP in the last 8760 hours. HbA1C: No results for input(s): HGBA1C in the last 72 hours. CBG: No results for input(s): GLUCAP in the last 168 hours. Lipid Profile: No results for input(s): CHOL, HDL, LDLCALC, TRIG, CHOLHDL, LDLDIRECT in the last 72 hours. Thyroid Function Tests: No results for input(s): TSH, T4TOTAL, FREET4, T3FREE, THYROIDAB in the last 72 hours. Anemia Panel: No results for input(s): VITAMINB12, FOLATE, FERRITIN, TIBC, IRON, RETICCTPCT in the last 72 hours. Sepsis Labs: No results for input(s): PROCALCITON, LATICACIDVEN in the last 168 hours.  Recent Results (from the past 240 hour(s))  Surgical pcr screen     Status: None   Collection Time: 04/18/17  3:00 AM  Result Value Ref Range Status   MRSA, PCR NEGATIVE NEGATIVE Final   Staphylococcus aureus NEGATIVE NEGATIVE Final    Comment: (NOTE) The Xpert SA Assay (FDA approved for NASAL specimens in patients 87 years of age and older), is one component of a comprehensive surveillance program. It is not intended to diagnose infection nor to guide or  monitor treatment.   Urine Culture     Status: None   Collection Time: 04/18/17 12:55 PM  Result Value Ref Range Status   Specimen Description URINE, CLEAN CATCH  Final   Special Requests NONE  Final   Culture NO GROWTH  Final   Report Status 04/19/2017 FINAL  Final         Radiology Studies: Dg Chest 1 View  Result Date: 04/17/2017 CLINICAL DATA:  Operative radiograph the 4 right hip fracture repair EXAM: CHEST 1 VIEW COMPARISON:  None. FINDINGS: Median sternotomy wires. Cardiomegaly. Mild interstitial pulmonary edema. No pleural effusion. No pneumothorax. No focal consolidation. IMPRESSION: Cardiomegaly and mild interstitial pulmonary edema. Electronically Signed   By: Ulyses Jarred M.D.   On: 04/17/2017 23:19   Ct Head Wo Contrast  Result Date: 04/18/2017 CLINICAL DATA:  Found on floor, with concern for head injury. EXAM: CT HEAD WITHOUT CONTRAST TECHNIQUE: Contiguous axial images were obtained from the base of the skull through the vertex without intravenous contrast. COMPARISON:  CT of the head performed 11/28/2015, and MRI of the brain performed 02/10/2015 FINDINGS: Brain: No evidence of acute infarction, hemorrhage, hydrocephalus, extra-axial collection or mass lesion/mass effect. Prominence of the ventricles and sulci reflects mild cortical volume loss. Mild cerebellar atrophy is noted. Scattered periventricular and subcortical white matter change likely reflects small vessel ischemic microangiopathy. The brainstem and fourth ventricle are within normal limits. The basal ganglia are unremarkable in appearance. The cerebral hemispheres demonstrate grossly normal gray-white differentiation. No mass effect or midline shift is seen. Vascular: No hyperdense vessel or unexpected calcification. Skull: There is no evidence of fracture; visualized osseous structures are unremarkable in appearance. Sinuses/Orbits: The orbits are within normal limits. There is mild partial opacification of the  left mastoid air cells. The paranasal sinuses and right mastoid air cells are well-aerated. Other: No significant soft tissue abnormalities are seen. IMPRESSION: 1. No acute intracranial pathology seen on CT. 2. Mild cortical volume loss and scattered small vessel ischemic microangiopathy. 3. Mild partial opacification of the left mastoid air cells. Electronically Signed   By: Garald Balding M.D.   On: 04/18/2017 00:11   Dg Hip Unilat  With Pelvis 2-3 Views Right  Result Date: 04/17/2017 CLINICAL DATA:  Fall with right hip pain. EXAM: DG HIP (WITH OR WITHOUT PELVIS) 2-3V RIGHT COMPARISON:  None. FINDINGS: There is a comminuted, medially angulated fracture of the intertrochanteric right femur. There is a medially displaced butterfly fragment inferior to the femoral neck. Left hip is normal. No pelvic fracture or diastasis. The right femoroacetabular joint remains approximated. IMPRESSION: Comminuted, medially angulated intertrochanteric fracture of the right femur. Electronically Signed   By: Ulyses Jarred M.D.   On: 04/17/2017 23:18        Scheduled Meds: . acetaminophen  1,000 mg Oral TID  . atorvastatin  20 mg Oral Daily  . budesonide (PULMICORT) nebulizer solution  0.5 mg Nebulization BID  . donepezil  10 mg Oral QHS  . ipratropium-albuterol  3 mL Nebulization BID  . levothyroxine  137 mcg Oral QAC breakfast  . loratadine  10 mg Oral Daily  . memantine  10 mg Oral BID  . metoprolol tartrate  12.5 mg Oral BID  . multivitamin with minerals  1 tablet Oral Daily  . pantoprazole  40 mg Oral Q0600  . senna  1 tablet Oral Daily  . sertraline  50 mg Oral QHS  . sodium chloride flush  3 mL Intravenous Q12H  . thiamine  100 mg Oral Daily   Continuous Infusions:   LOS: 1 day    Time spent: 35 mins    Irine Seal, MD [facility] Pager [phone]  If 7PM-7AM, please contact night-coverage [email or web address] Password [email or web address] 04/19/2017, 5:08 PM

## 2017-04-19 NOTE — Progress Notes (Signed)
     Subjective:   Procedure(s) (LRB): INTRAMEDULLARY (IM) NAIL INTERTROCHANTRIC (Right) Right leg in longitudinal traction. Exquisite pain with any movemet.  Patient reports pain as moderate.    Objective:   VITALS:  Temp:  [98.1 F (36.7 C)-99.2 F (37.3 C)] 99.2 F (37.3 C) (12/25 0429) Pulse Rate:  [67-78] 71 (12/25 0429) Resp:  [16] 16 (12/24 1600) BP: (110-143)/(52-62) 129/58 (12/25 0429) SpO2:  [90 %-100 %] 97 % (12/25 0835) Weight:  [181 lb 7 oz (82.3 kg)] 181 lb 7 oz (82.3 kg) (12/25 0429)  Neurologically intact ABD soft Neurovascular intact Sensation intact distally Intact pulses distally Dorsiflexion/Plantar flexion intact Compartment soft   LABS Recent Labs    04/17/17 2319 04/19/17 0715  HGB 13.3 11.8*  WBC 22.3* 17.6*  PLT 148* 93*   Recent Labs    04/18/17 0526 04/19/17 0715  NA 140 135  K 4.6 5.0  CL 102 98*  CO2 31 28  BUN 18 19  CREATININE 1.41* 1.33*  GLUCOSE 148* 96   No results for input(s): LABPT, INR in the last 72 hours.   Assessment/Plan:   Procedure(s) (LRB): INTRAMEDULLARY (IM) NAIL INTERTROCHANTRIC (Right)  EF 60-65 percent, mild pulmonary hypertension.  Continue foley due to acute urinary retention and strict I&O Evaluation by Medicine, EF is 60-65 persent, mild pulmonary hypertension otherwise negative.  Right intertrochanteric nail preop.  Basil Dess 04/19/2017, 9:23 AMPatient ID: Sarah Johnson, female   DOB: 1932-05-11, 83 y.o.   MRN: [id number]

## 2017-04-20 ENCOUNTER — Inpatient Hospital Stay (HOSPITAL_COMMUNITY): Payer: Medicare Other | Admitting: Certified Registered Nurse Anesthetist

## 2017-04-20 ENCOUNTER — Encounter (HOSPITAL_COMMUNITY)
Admission: EM | Disposition: A | Discharge status: Skilled Nursing Facility | Payer: Self-pay | Source: Home / Self Care | Attending: Internal Medicine

## 2017-04-20 ENCOUNTER — Encounter (HOSPITAL_COMMUNITY): Payer: Self-pay | Admitting: Certified Registered Nurse Anesthetist

## 2017-04-20 ENCOUNTER — Inpatient Hospital Stay (HOSPITAL_COMMUNITY): Payer: Medicare Other

## 2017-04-20 DIAGNOSIS — S72141A Displaced intertrochanteric fracture of right femur, initial encounter for closed fracture: Secondary | ICD-10-CM

## 2017-04-20 HISTORY — PX: INTRAMEDULLARY (IM) NAIL INTERTROCHANTERIC: SHX5875

## 2017-04-20 LAB — CBC WITH DIFFERENTIAL/PLATELET
BASOS PCT: 0 %
Basophils Absolute: 0 10*3/uL (ref 0.0–0.1)
EOS ABS: 0.2 10*3/uL (ref 0.0–0.7)
EOS PCT: 1 %
HEMATOCRIT: 35.9 % — AB (ref 36.0–46.0)
HEMOGLOBIN: 11.3 g/dL — AB (ref 12.0–15.0)
LYMPHS PCT: 12 %
Lymphs Abs: 2 10*3/uL (ref 0.7–4.0)
MCH: 29.4 pg (ref 26.0–34.0)
MCHC: 31.5 g/dL (ref 30.0–36.0)
MCV: 93.5 fL (ref 78.0–100.0)
MONOS PCT: 10 %
Monocytes Absolute: 1.7 10*3/uL — ABNORMAL HIGH (ref 0.1–1.0)
NEUTROS ABS: 12.9 10*3/uL — AB (ref 1.7–7.7)
Neutrophils Relative %: 77 %
Platelets: UNDETERMINED 10*3/uL (ref 150–400)
RBC: 3.84 MIL/uL — ABNORMAL LOW (ref 3.87–5.11)
RDW: 16.7 % — ABNORMAL HIGH (ref 11.5–15.5)
WBC: 16.8 10*3/uL — ABNORMAL HIGH (ref 4.0–10.5)

## 2017-04-20 LAB — BASIC METABOLIC PANEL
Anion gap: 8 (ref 5–15)
BUN: 26 mg/dL — AB (ref 6–20)
CHLORIDE: 95 mmol/L — AB (ref 101–111)
CO2: 29 mmol/L (ref 22–32)
Calcium: 8.7 mg/dL — ABNORMAL LOW (ref 8.9–10.3)
Creatinine, Ser: 1.62 mg/dL — ABNORMAL HIGH (ref 0.44–1.00)
GFR calc Af Amer: 33 mL/min — ABNORMAL LOW (ref >60)
GFR calc non Af Amer: 28 mL/min — ABNORMAL LOW (ref >60)
GLUCOSE: 102 mg/dL — AB (ref 65–99)
POTASSIUM: 4.7 mmol/L (ref 3.5–5.1)
Sodium: 132 mmol/L — ABNORMAL LOW (ref 135–145)

## 2017-04-20 LAB — MAGNESIUM: Magnesium: 3 mg/dL — ABNORMAL HIGH (ref 1.7–2.4)

## 2017-04-20 SURGERY — FIXATION, FRACTURE, INTERTROCHANTERIC, WITH INTRAMEDULLARY ROD
Anesthesia: General | Laterality: Right

## 2017-04-20 MED ORDER — HYDROCODONE-ACETAMINOPHEN 5-325 MG PO TABS
1.0000 | ORAL_TABLET | Freq: Four times a day (QID) | ORAL | Status: DC | PRN
  Administered 2017-04-22 (×2): 2 via ORAL
  Filled 2017-04-20 (×2): qty 2

## 2017-04-20 MED ORDER — SUGAMMADEX SODIUM 500 MG/5ML IV SOLN
INTRAVENOUS | Status: AC
  Filled 2017-04-20: qty 5

## 2017-04-20 MED ORDER — ACETAMINOPHEN 325 MG PO TABS: 650.0000 mg | ORAL_TABLET | Freq: Four times a day (QID) | ORAL | Status: DC | PRN

## 2017-04-20 MED ORDER — SUGAMMADEX SODIUM 200 MG/2ML IV SOLN
INTRAVENOUS | Status: DC | PRN
  Administered 2017-04-20: 200 mg via INTRAVENOUS

## 2017-04-20 MED ORDER — LIDOCAINE 2% (20 MG/ML) 5 ML SYRINGE
INTRAMUSCULAR | Status: AC
  Filled 2017-04-20: qty 5

## 2017-04-20 MED ORDER — SODIUM CHLORIDE 0.9 % IV SOLN
INTRAVENOUS | Status: DC
  Administered 2017-04-20 – 2017-04-21 (×2): via INTRAVENOUS

## 2017-04-20 MED ORDER — METHOCARBAMOL 500 MG PO TABS
500.0000 mg | ORAL_TABLET | Freq: Four times a day (QID) | ORAL | Status: DC | PRN
  Administered 2017-04-22: 500 mg via ORAL
  Filled 2017-04-20: qty 1

## 2017-04-20 MED ORDER — FENTANYL CITRATE (PF) 100 MCG/2ML IJ SOLN
INTRAMUSCULAR | Status: DC | PRN
  Administered 2017-04-20: 50 ug via INTRAVENOUS
  Administered 2017-04-20: 25 ug via INTRAVENOUS

## 2017-04-20 MED ORDER — FENTANYL CITRATE (PF) 250 MCG/5ML IJ SOLN
INTRAMUSCULAR | Status: AC
  Filled 2017-04-20: qty 5

## 2017-04-20 MED ORDER — RIVAROXABAN 15 MG PO TABS: 15.0000 mg | ORAL_TABLET | Freq: Two times a day (BID) | ORAL | 0 refills | Status: DC

## 2017-04-20 MED ORDER — ROCURONIUM BROMIDE 10 MG/ML (PF) SYRINGE
PREFILLED_SYRINGE | INTRAVENOUS | Status: AC
  Filled 2017-04-20: qty 5

## 2017-04-20 MED ORDER — RIVAROXABAN 10 MG PO TABS: 10.0000 mg | ORAL_TABLET | Freq: Every day | ORAL | Status: DC

## 2017-04-20 MED ORDER — LACTATED RINGERS IV SOLN
INTRAVENOUS | Status: DC
  Administered 2017-04-20: 14:00:00 via INTRAVENOUS

## 2017-04-20 MED ORDER — ALUM & MAG HYDROXIDE-SIMETH 200-200-20 MG/5ML PO SUSP: 30.0000 mL | ORAL | Status: DC | PRN

## 2017-04-20 MED ORDER — DEXAMETHASONE SODIUM PHOSPHATE 10 MG/ML IJ SOLN
INTRAMUSCULAR | Status: AC
  Filled 2017-04-20: qty 1

## 2017-04-20 MED ORDER — POVIDONE-IODINE 10 % EX SWAB: 2.0000 "application " | Freq: Once | CUTANEOUS | Status: DC

## 2017-04-20 MED ORDER — OXYCODONE HCL 5 MG PO TABS
5.0000 mg | ORAL_TABLET | ORAL | Status: DC | PRN
  Administered 2017-04-20 – 2017-04-21 (×2): 10 mg via ORAL
  Filled 2017-04-20 (×3): qty 2

## 2017-04-20 MED ORDER — METOCLOPRAMIDE HCL 5 MG/ML IJ SOLN: 5.0000 mg | Freq: Three times a day (TID) | INTRAMUSCULAR | Status: DC | PRN

## 2017-04-20 MED ORDER — ONDANSETRON HCL 4 MG/2ML IJ SOLN: 4.0000 mg | Freq: Four times a day (QID) | INTRAMUSCULAR | Status: DC | PRN

## 2017-04-20 MED ORDER — METOCLOPRAMIDE HCL 5 MG PO TABS: 5.0000 mg | ORAL_TABLET | Freq: Three times a day (TID) | ORAL | Status: DC | PRN

## 2017-04-20 MED ORDER — CLINDAMYCIN PHOSPHATE 900 MG/50ML IV SOLN
900.0000 mg | INTRAVENOUS | Status: AC
Start: 2017-04-20 — End: 2017-04-20
  Administered 2017-04-20: 900 mg via INTRAVENOUS
  Filled 2017-04-20: qty 50

## 2017-04-20 MED ORDER — PHENYLEPHRINE 40 MCG/ML (10ML) SYRINGE FOR IV PUSH (FOR BLOOD PRESSURE SUPPORT)
PREFILLED_SYRINGE | INTRAVENOUS | Status: AC
  Filled 2017-04-20: qty 10

## 2017-04-20 MED ORDER — PROPOFOL 10 MG/ML IV BOLUS
INTRAVENOUS | Status: AC
  Filled 2017-04-20: qty 20

## 2017-04-20 MED ORDER — CLINDAMYCIN PHOSPHATE 600 MG/50ML IV SOLN
600.0000 mg | Freq: Four times a day (QID) | INTRAVENOUS | Status: AC
  Administered 2017-04-20 – 2017-04-21 (×3): 600 mg via INTRAVENOUS
  Filled 2017-04-20 (×3): qty 50

## 2017-04-20 MED ORDER — DEXAMETHASONE SODIUM PHOSPHATE 10 MG/ML IJ SOLN
INTRAMUSCULAR | Status: DC | PRN
  Administered 2017-04-20: 10 mg via INTRAVENOUS

## 2017-04-20 MED ORDER — PROPOFOL 10 MG/ML IV BOLUS
INTRAVENOUS | Status: DC | PRN
  Administered 2017-04-20: 100 mg via INTRAVENOUS

## 2017-04-20 MED ORDER — ROCURONIUM BROMIDE 50 MG/5ML IV SOSY
PREFILLED_SYRINGE | INTRAVENOUS | Status: DC | PRN
  Administered 2017-04-20: 20 mg via INTRAVENOUS
  Administered 2017-04-20: 50 mg via INTRAVENOUS

## 2017-04-20 MED ORDER — MORPHINE SULFATE (PF) 2 MG/ML IV SOLN: 0.5000 mg | INTRAVENOUS | Status: DC | PRN

## 2017-04-20 MED ORDER — MENTHOL 3 MG MT LOZG: 1.0000 | LOZENGE | OROMUCOSAL | Status: DC | PRN

## 2017-04-20 MED ORDER — PHENOL 1.4 % MT LIQD: 1.0000 | OROMUCOSAL | Status: DC | PRN

## 2017-04-20 MED ORDER — METHOCARBAMOL 1000 MG/10ML IJ SOLN
500.0000 mg | Freq: Four times a day (QID) | INTRAVENOUS | Status: DC | PRN
  Filled 2017-04-20: qty 5

## 2017-04-20 MED ORDER — LIDOCAINE 2% (20 MG/ML) 5 ML SYRINGE
INTRAMUSCULAR | Status: DC | PRN
  Administered 2017-04-20: 100 mg via INTRAVENOUS

## 2017-04-20 MED ORDER — OXYCODONE HCL 5 MG PO TABS: 5.0000 mg | ORAL_TABLET | ORAL | 0 refills | Status: DC | PRN

## 2017-04-20 MED ORDER — ACETAMINOPHEN 650 MG RE SUPP: 650.0000 mg | Freq: Four times a day (QID) | RECTAL | Status: DC | PRN

## 2017-04-20 MED ORDER — PHENYLEPHRINE 40 MCG/ML (10ML) SYRINGE FOR IV PUSH (FOR BLOOD PRESSURE SUPPORT)
PREFILLED_SYRINGE | INTRAVENOUS | Status: DC | PRN
  Administered 2017-04-20 (×6): 80 ug via INTRAVENOUS

## 2017-04-20 MED ORDER — ONDANSETRON HCL 4 MG PO TABS: 4.0000 mg | ORAL_TABLET | Freq: Four times a day (QID) | ORAL | Status: DC | PRN

## 2017-04-20 MED ORDER — RIVAROXABAN 10 MG PO TABS
10.0000 mg | ORAL_TABLET | Freq: Every day | ORAL | Status: DC
  Administered 2017-04-21 – 2017-04-22 (×2): 10 mg via ORAL
  Filled 2017-04-20 (×2): qty 1

## 2017-04-20 MED ORDER — ONDANSETRON HCL 4 MG/2ML IJ SOLN
INTRAMUSCULAR | Status: AC
  Filled 2017-04-20: qty 2

## 2017-04-20 MED ORDER — ONDANSETRON HCL 4 MG/2ML IJ SOLN
INTRAMUSCULAR | Status: DC | PRN
  Administered 2017-04-20: 4 mg via INTRAVENOUS

## 2017-04-20 SURGICAL SUPPLY — 43 items
BIT DRILL SHORT 4.0 (BIT) ×1 IMPLANT
BNDG COHESIVE 4X5 TAN NS LF (GAUZE/BANDAGES/DRESSINGS) ×3 IMPLANT
BNDG COHESIVE 6X5 TAN STRL LF (GAUZE/BANDAGES/DRESSINGS) ×6 IMPLANT
BNDG GAUZE ELAST 4 BULKY (GAUZE/BANDAGES/DRESSINGS) ×3 IMPLANT
COVER PERINEAL POST (MISCELLANEOUS) ×3 IMPLANT
COVER SURGICAL LIGHT HANDLE (MISCELLANEOUS) ×3 IMPLANT
DRAPE STERI IOBAN 125X83 (DRAPES) ×3 IMPLANT
DRILL BIT SHORT 4.0 (BIT) ×2
DRSG MEPILEX BORDER 4X4 (GAUZE/BANDAGES/DRESSINGS) ×9 IMPLANT
DRSG MEPILEX BORDER 4X8 (GAUZE/BANDAGES/DRESSINGS) ×3 IMPLANT
DRSG PAD ABDOMINAL 8X10 ST (GAUZE/BANDAGES/DRESSINGS) ×6 IMPLANT
DURAPREP 26ML APPLICATOR (WOUND CARE) ×3 IMPLANT
ELECT REM PT RETURN 9FT ADLT (ELECTROSURGICAL) ×3
ELECTRODE REM PT RTRN 9FT ADLT (ELECTROSURGICAL) ×1 IMPLANT
GLOVE BIO SURGEON STRL SZ 6.5 (GLOVE) ×2 IMPLANT
GLOVE BIO SURGEONS STRL SZ 6.5 (GLOVE) ×1
GLOVE BIOGEL PI IND STRL 6.5 (GLOVE) ×1 IMPLANT
GLOVE BIOGEL PI INDICATOR 6.5 (GLOVE) ×2
GLOVE SKINSENSE NS SZ7.5 (GLOVE) ×4
GLOVE SKINSENSE STRL SZ7.5 (GLOVE) ×2 IMPLANT
GOWN STRL REIN XL XLG (GOWN DISPOSABLE) ×3 IMPLANT
GUIDE PIN 3.2X343 (PIN) ×2
GUIDE PIN 3.2X343MM (PIN) ×4
KIT BASIN OR (CUSTOM PROCEDURE TRAY) ×3 IMPLANT
KIT ROOM TURNOVER OR (KITS) ×3 IMPLANT
MANIFOLD NEPTUNE II (INSTRUMENTS) ×3 IMPLANT
NAIL TRIGEN RT 10MMX36CM-125 (Nail) ×3 IMPLANT
NS IRRIG 1000ML POUR BTL (IV SOLUTION) ×3 IMPLANT
PACK GENERAL/GYN (CUSTOM PROCEDURE TRAY) ×3 IMPLANT
PAD ARMBOARD 7.5X6 YLW CONV (MISCELLANEOUS) ×6 IMPLANT
PAD CAST 4YDX4 CTTN HI CHSV (CAST SUPPLIES) ×2 IMPLANT
PADDING CAST COTTON 4X4 STRL (CAST SUPPLIES) ×4
PIN GUIDE 3.2X343MM (PIN) ×2 IMPLANT
SCREW LAG COMPR KIT 90/85 (Screw) ×3 IMPLANT
SCREW TRIGEN LOW PROF 5.0X32.5 (Screw) ×3 IMPLANT
STAPLER VISISTAT 35W (STAPLE) ×3 IMPLANT
SUT VIC AB 0 CT1 27 (SUTURE) ×2
SUT VIC AB 0 CT1 27XBRD ANBCTR (SUTURE) ×1 IMPLANT
SUT VIC AB 2-0 CT1 27 (SUTURE) ×2
SUT VIC AB 2-0 CT1 TAPERPNT 27 (SUTURE) ×1 IMPLANT
TOWEL OR 17X24 6PK STRL BLUE (TOWEL DISPOSABLE) ×3 IMPLANT
TOWEL OR 17X26 10 PK STRL BLUE (TOWEL DISPOSABLE) ×3 IMPLANT
WATER STERILE IRR 1000ML POUR (IV SOLUTION) ×3 IMPLANT

## 2017-04-20 NOTE — H&P (Signed)

## 2017-04-20 NOTE — Progress Notes (Signed)
[facility] [place] for Xarelto Indication: VTE prophylaxis and hx VTE  Allergies  Allergen Reactions  . Codeine Other (See Comments)    Pt. States it has been too long to remember the reaction   . Penicillins Other (See Comments)    Pt. Does not recall the reaction.  Has patient had a PCN reaction causing immediate rash, facial/tongue/throat swelling, SOB or lightheadedness with hypotension:  Has patient had a PCN reaction causing severe rash involving mucus membranes or skin necrosis: Has patient had a PCN reaction that required hospitalization  Has patient had a PCN reaction occurring within the last 10 years:  If all of the above answers are "NO", then may proceed with Cephalosporin use.     Patient Measurements: Height: 5' (152.4 cm) Weight: 183 lb 6.8 oz (83.2 kg) IBW/kg (Calculated) : 45.5  Labs: Recent Labs    04/17/17 2319 04/18/17 0526 04/19/17 0715 04/20/17 0700  HGB 13.3  --  11.8* 11.3*  HCT 42.7  --  37.9 35.9*  PLT 148*  --  93* PLATELET CLUMPS NOTED ON SMEAR, UNABLE TO ESTIMATE  CREATININE 1.46* 1.41* 1.33* 1.62*    Estimated Creatinine Clearance: 25.6 mL/min (A) (by C-G formula based on SCr of 1.62 mg/dL (H)).   Assessment: 83 y/o female who sustained a right femur fracture after a fall at home. She has a hx of VTE (>1 yr ago) and is on Xarelto PTA. Pharmacy consulted to resume Xarelto for VTE prophylaxis 24 hrs post-op.   Patient takes Xarelto 15 mg PO daily PTA however typical dose for this indication is 20 mg daily and dose is not adjusted for CrCl >30 ml/min. Since VTE event is >1 yr ago, would be appropriate to put patient on orthopedic dose which is also the extended prophylaxis dose after a VTE event. Confirmed with Dr. Grandville Silos this dose is ok.  CKD noted, Scr 1.62, CrCl 35 ml/min based on TBW. No bleeding noted, Hgb 11.3, platelets trended down to 93 on 12/25.  Plan:  Xarelto 10 mg PO daily with supper - begin  12/27 Need education about new dose prior to discharge Monitor for s/sx of bleeding   Renold Genta, PharmD, BCPS Clinical Pharmacist Phone for today - [place] - [phone] 04/20/2017 6:00 PM

## 2017-04-20 NOTE — Anesthesia Postprocedure Evaluation (Signed)
Anesthesia Post Note  Patient: Sarah Johnson  Procedure(s) Performed: INTRAMEDULLARY (IM) NAIL INTERTROCHANTRIC (Right )     Patient location during evaluation: PACU Anesthesia Type: General Level of consciousness: awake and alert Pain management: pain level controlled Vital Signs Assessment: post-procedure vital signs reviewed and stable Respiratory status: spontaneous breathing, nonlabored ventilation, respiratory function stable and patient connected to nasal cannula oxygen Cardiovascular status: blood pressure returned to baseline and stable Postop Assessment: no apparent nausea or vomiting Anesthetic complications: no    Last Vitals:  Vitals:   04/20/17 1700 04/20/17 1708  BP:  (!) 105/50  Pulse: 89 88  Resp: 16 17  Temp:    SpO2: 94% 91%    Last Pain:  Vitals:   04/20/17 1710  TempSrc:   PainSc: 0-No pain                 Lamoyne Palencia EDWARD

## 2017-04-20 NOTE — Progress Notes (Signed)
Unable to remove ring on left hand, secured with tape, daughters at bedside aware.

## 2017-04-20 NOTE — Transfer of Care (Signed)
Immediate Anesthesia Transfer of Care Note  Patient: Sarah Johnson  Procedure(s) Performed: INTRAMEDULLARY (IM) NAIL INTERTROCHANTRIC (Right )  Patient Location: PACU  Anesthesia Type:General  Level of Consciousness: awake, patient cooperative and responds to stimulation  Airway & Oxygen Therapy: Patient Spontanous Breathing and Patient connected to face mask oxygen  Post-op Assessment: Report given to RN and Post -op Vital signs reviewed and stable  Post vital signs: Reviewed and stable  Last Vitals:  Vitals:   04/20/17 0733 04/20/17 1300  BP:  128/62  Pulse:  76  Resp:  18  Temp:  36.7 C  SpO2: 92% 93%    Last Pain:  Vitals:   04/20/17 1300  TempSrc: Oral  PainSc:          Complications: No apparent anesthesia complications

## 2017-04-20 NOTE — Anesthesia Procedure Notes (Signed)
Procedure Name: Intubation Date/Time: 04/20/2017 2:30 PM Performed by: Genelle Bal, CRNA Pre-anesthesia Checklist: Patient identified, Emergency Drugs available, Suction available and Patient being monitored Patient Re-evaluated:Patient Re-evaluated prior to induction Oxygen Delivery Method: Circle system utilized Preoxygenation: Pre-oxygenation with 100% oxygen Induction Type: IV induction Ventilation: Mask ventilation without difficulty Laryngoscope Size: Miller and 2 Tube type: Oral Tube size: 7.0 mm Number of attempts: 1 Airway Equipment and Method: Stylet and Oral airway Placement Confirmation: ETT inserted through vocal cords under direct vision,  positive ETCO2 and breath sounds checked- equal and bilateral Secured at: 21 cm Tube secured with: Tape Dental Injury: Teeth and Oropharynx as per pre-operative assessment

## 2017-04-20 NOTE — Progress Notes (Signed)
PROGRESS NOTE    MERLE WHITEHORN  HGD:[id number] DOB: November 12, 1932 DOA: 04/17/2017 PCP: Jani Gravel, MD   Brief Narrative:  Patient is a 83 year old female, history of mild dementia, COPD, history of DVT on anticoagulation with toe walk, aneurysmal repair in 2009 presented with a fall and noted to have a right intertrochanteric hip fracture.  Orthopedics and cardiology consulted.   Assessment & Plan:   Active Problems:   History of aortic valve replacement   Hip fracture [facility])   Preoperative evaluation of a medical condition to rule out surgical contraindications (TAR required)  #1 right intertrochanteric hip fracture Secondary to mechanical fall.  Patient has been seen in consultation by orthopedics and currently in Buck's traction.  Patient has been seen by cardiology and feel patient will be okay to undergo surgery with no further ischemic workup needed.  Orthopedics planning on surgical repair this afternoon per nursing.  Continue current pain management. Per orthopedics.  2.  History of aortic valve replacement (bioprosthetic) Repeat 2D echo with right ventricular systolic pressure increased consistent with mild pulmonary hypertension.  Estimated EF of 60-65%.  No wall motion abnormalities.  PA peak pressure of 37 mmHg.  Patient has been seen in consultation by cardiology.  Continue cardiac regimen of Lipitor, Lopressor.  Cardiology following.  3.  Leukocytosis Likely a reactive leukocytosis.  Urinalysis unremarkable.  Urine cultures negative.  X-rays negative for any infiltrate.  Afebrile.  Labs pending this morning.  No need for antibiotics at this time.  Follow.  4.  Hypertension Continue current regimen beta-blocker.  5.  History of peripheral arterial disease Stable.  Diet modification.  Continue statin.  6.  COPD Stable.  Continue scheduled bronchodilators.  Oxygen as needed.  7.  History of DVT Occurred greater than 6 months ago.  Patient was on Xarelto with last dose  04/16/2017.  Xarelto on hold in anticipation of surgery.  Orthopedics to advise when anticoagulation may be resumed.  Follow.   DVT prophylaxis: SCDs Code Status: DNR Family Communication: Updated patient.  No family at bedside.  Disposition Plan: Pending surgical repair per orthopedics.  Likely skilled nursing facility versus home with home health.   Consultants:   Orthopedics: Dr. Ninfa Linden 04/18/2017  Cardiology: Dr. Gwenlyn Found 04/18/2017  Procedures:   2D echo 04/18/2017  Chest x-ray 04/17/2017  CT head without contrast 04/17/2017  Plain films of the right hip and pelvis 04/17/2017  Antimicrobials:   None   Subjective: Laying in bed complaining of some back pain and trying to move around.  No shortness of breath.  No chest pain.  Objective: Vitals:   04/19/17 2118 04/20/17 0500 04/20/17 0545 04/20/17 0733  BP:   (!) 138/53   Pulse:   81   Resp:   16   Temp:   98.5 F (36.9 C)   TempSrc:   Axillary   SpO2: 93%  90% 92%  Weight:  83.2 kg (183 lb 6.8 oz)    Height:        Intake/Output Summary (Last 24 hours) at 04/20/2017 0900 Last data filed at 04/19/2017 1700 Gross per 24 hour  Intake -  Output 300 ml  Net -300 ml   Filed Weights   04/17/17 2232 04/19/17 0429 04/20/17 0500  Weight: 65.8 kg (145 lb) 82.3 kg (181 lb 7 oz) 83.2 kg (183 lb 6.8 oz)    Examination:  General exam: NAD Respiratory system: Clear to auscultation anterior lung fields.  No crackles, no rhonchi, no wheezing. Respiratory effort normal.  Cardiovascular system: Regular rate and rhythm no murmurs rubs or gallops.  No JVD.  No lower extremity edema.   Gastrointestinal system: Abdomen is soft, nontender, nondistended, positive bowel sounds.  Central nervous system: Alert and oriented. No focal neurological deficits. Extremities: Right lower extremity in Buck's traction. Skin: No rashes, lesions or ulcers Psychiatry: Judgement and insight appear fair. Mood & affect appropriate.      Data Reviewed: I have personally reviewed following labs and imaging studies  CBC: Recent Labs  Lab 04/17/17 2319 04/19/17 0715  WBC 22.3* 17.6*  NEUTROABS 19.8*  --   HGB 13.3 11.8*  HCT 42.7 37.9  MCV 94.9 94.8  PLT 148* 93*   Basic Metabolic Panel: Recent Labs  Lab 04/17/17 2319 04/18/17 0526 04/19/17 0715  NA 140 140 135  K 4.3 4.6 5.0  CL 101 102 98*  CO2 30 31 28   GLUCOSE 131* 148* 96  BUN 17 18 19   CREATININE 1.46* 1.41* 1.33*  CALCIUM 9.3 9.2 9.1  MG  --   --  1.7   GFR: Estimated Creatinine Clearance: 31.2 mL/min (A) (by C-G formula based on SCr of 1.33 mg/dL (H)). Liver Function Tests: Recent Labs  Lab 04/18/17 0526  AST 26  ALT 14  ALKPHOS 82  BILITOT 0.7  PROT 6.3*  ALBUMIN 3.0*   No results for input(s): LIPASE, AMYLASE in the last 168 hours. No results for input(s): AMMONIA in the last 168 hours. Coagulation Profile: No results for input(s): INR, PROTIME in the last 168 hours. Cardiac Enzymes: No results for input(s): CKTOTAL, CKMB, CKMBINDEX, TROPONINI in the last 168 hours. BNP (last 3 results) No results for input(s): PROBNP in the last 8760 hours. HbA1C: No results for input(s): HGBA1C in the last 72 hours. CBG: No results for input(s): GLUCAP in the last 168 hours. Lipid Profile: No results for input(s): CHOL, HDL, LDLCALC, TRIG, CHOLHDL, LDLDIRECT in the last 72 hours. Thyroid Function Tests: No results for input(s): TSH, T4TOTAL, FREET4, T3FREE, THYROIDAB in the last 72 hours. Anemia Panel: No results for input(s): VITAMINB12, FOLATE, FERRITIN, TIBC, IRON, RETICCTPCT in the last 72 hours. Sepsis Labs: No results for input(s): PROCALCITON, LATICACIDVEN in the last 168 hours.  Recent Results (from the past 240 hour(s))  Surgical pcr screen     Status: None   Collection Time: 04/18/17  3:00 AM  Result Value Ref Range Status   MRSA, PCR NEGATIVE NEGATIVE Final   Staphylococcus aureus NEGATIVE NEGATIVE Final    Comment:  (NOTE) The Xpert SA Assay (FDA approved for NASAL specimens in patients 14 years of age and older), is one component of a comprehensive surveillance program. It is not intended to diagnose infection nor to guide or monitor treatment.   Urine Culture     Status: None   Collection Time: 04/18/17 12:55 PM  Result Value Ref Range Status   Specimen Description URINE, CLEAN CATCH  Final   Special Requests NONE  Final   Culture NO GROWTH  Final   Report Status 04/19/2017 FINAL  Final         Radiology Studies: No results found.      Scheduled Meds: . acetaminophen  1,000 mg Oral TID  . atorvastatin  20 mg Oral Daily  . budesonide (PULMICORT) nebulizer solution  0.5 mg Nebulization BID  . donepezil  10 mg Oral QHS  . ipratropium-albuterol  3 mL Nebulization BID  . levothyroxine  137 mcg Oral QAC breakfast  . loratadine  10 mg Oral  Daily  . memantine  10 mg Oral BID  . metoprolol tartrate  12.5 mg Oral BID  . multivitamin with minerals  1 tablet Oral Daily  . pantoprazole  40 mg Oral Q0600  . povidone-iodine  2 application Topical Once  . senna  1 tablet Oral Daily  . sertraline  50 mg Oral QHS  . sodium chloride flush  3 mL Intravenous Q12H  . thiamine  100 mg Oral Daily   Continuous Infusions: . clindamycin (CLEOCIN) IV       LOS: 2 days    Time spent: 35 mins    Irine Seal, MD [facility] Pager [phone]  If 7PM-7AM, please contact night-coverage [email or web address] Password [email or web address] 04/20/2017, 9:00 AM

## 2017-04-20 NOTE — Anesthesia Preprocedure Evaluation (Addendum)
Anesthesia Evaluation  Patient identified by MRN, date of birth, ID band Patient awake    Reviewed: Allergy & Precautions, H&P , Patient's Chart, lab work & pertinent test results, reviewed documented beta blocker date and time   Airway Mallampati: II  TM Distance: >3 FB Neck ROM: full    Dental no notable dental hx.    Pulmonary COPD, former smoker,    Pulmonary exam normal breath sounds clear to auscultation       Cardiovascular hypertension,  Rhythm:regular Rate:Normal     Neuro/Psych    GI/Hepatic   Endo/Other    Renal/GU      Musculoskeletal   Abdominal   Peds  Hematology   Anesthesia Other Findings S/p AVR - Left ventricle:ejection fraction was in the range of 60% to 65%.   PA peak pressure: 37 mm Hg (S)..... consistent   with mild pulmonary hypertension.  Reproductive/Obstetrics                            Anesthesia Physical Anesthesia Plan  ASA: II  Anesthesia Plan: General   Post-op Pain Management:    Induction: Intravenous  PONV Risk Score and Plan: 2 and Treatment may vary due to age or medical condition, Dexamethasone and Ondansetron  Airway Management Planned: Oral ETT  Additional Equipment:   Intra-op Plan:   Post-operative Plan: Extubation in OR  Informed Consent: I have reviewed the patients History and Physical, chart, labs and discussed the procedure including the risks, benefits and alternatives for the proposed anesthesia with the patient or authorized representative who has indicated his/her understanding and acceptance.   Dental Advisory Given  Plan Discussed with: CRNA and Surgeon  Anesthesia Plan Comments: (  )        Anesthesia Quick Evaluation

## 2017-04-20 NOTE — Plan of Care (Signed)
  Nutrition: Adequate nutrition will be maintained 04/20/2017 1049 - Progressing by Williams Che, RN   Coping: Level of anxiety will decrease 04/20/2017 1049 - Progressing by Williams Che, RN   Elimination: Will not experience complications related to bowel motility 04/20/2017 1049 - Progressing by Williams Che, RN   Pain Managment: General experience of comfort will improve 04/20/2017 1049 - Progressing by Williams Che, RN   Safety: Ability to remain free from injury will improve 04/20/2017 1049 - Progressing by Williams Che, RN

## 2017-04-20 NOTE — Op Note (Signed)
   Date of Surgery: 04/20/2017  INDICATIONS: Ms. Crisp is a 83 y.o.-year-old female who sustained a right hip fracture. The risks and benefits of the procedure discussed with the family prior to the procedure and all questions were answered; consent was obtained.  PREOPERATIVE DIAGNOSIS: right pertrochanteric hip fracture   POSTOPERATIVE DIAGNOSIS: Same   PROCEDURE: Treatment of pertrochanteric fracture with intramedullary implant. CPT [phone]   SURGEON: N. Eduard Roux, M.D.   ASSISTANTS: Tawanna Cooler, PA-C, necessary for timely completion of the procedure  ANESTHESIA: general   IV FLUIDS AND URINE: See anesthesia record   ESTIMATED BLOOD LOSS: 200 cc  IMPLANTS: [employer] InterTAN 10 x 34, 90/85 lag screws  DRAINS: None.   COMPLICATIONS: None.   DESCRIPTION OF PROCEDURE: The patient was brought to the operating room and placed supine on the operating table. The patient's leg had been signed prior to the procedure. The patient had the anesthesia placed by the anesthesiologist. The prep verification and incision time-outs were performed to confirm that this was the correct patient, site, side and location. The patient had an SCD on the opposite lower extremity. The patient did receive antibiotics prior to the incision and was re-dosed during the procedure as needed at indicated intervals. The patient was positioned on the fracture table with the table in traction and internal rotation to reduce the hip. The well leg was placed in a scissor position and all bony prominences were well-padded. The patient had the lower extremity prepped and draped in the standard surgical fashion. The incision was made 4 finger breadths superior to the greater trochanter. A guide pin was inserted into the tip of the greater trochanter under fluoroscopic guidance. An opening reamer was used to gain access to the femoral canal. The nail length was measured and inserted down the femoral canal to its  proper depth. The appropriate version of insertion for the lag screw was found under fluoroscopy. A bone hook was used to reduce the fracture out of valgus alignment.  A pin was inserted up the femoral neck through the jig. Then, a second antirotation pin was inserted inferior to the first pin. The length of the lag screw was then measured. The lag screw was inserted as near to center-center in the head as possible. The antirotation pin was then taken out and an interdigitating compression screw was placed in its place. A single distal interlocking screw was placed using the perfect circle technique.  The leg was taken out of traction, then the interdigitating compression screw was used to compress across the fracture. Compression was visualized on serial xrays. The wound was copiously irrigated with saline and the subcutaneous layer closed with 2.0 vicryl and the skin was reapproximated with staples. The wounds were cleaned and dried a final time and a sterile dressing was placed. The hip was taken through a range of motion at the end of the case under fluoroscopic imaging to visualize the approach-withdraw phenomenon and confirm implant length in the head. The patient was then awakened from anesthesia and taken to the recovery room in stable condition. All counts were correct at the end of the case.   POSTOPERATIVE PLAN: The patient will be weight bearing as tolerated and will return in 2 weeks for staple removal and the patient will receive DVT prophylaxis based on other medications, activity level, and risk ratio of bleeding to thrombosis.   Azucena Cecil, MD [facility] [phone] 3:41 PM

## 2017-04-21 ENCOUNTER — Encounter (HOSPITAL_COMMUNITY): Payer: Self-pay | Admitting: Orthopaedic Surgery

## 2017-04-21 DIAGNOSIS — S72141D Displaced intertrochanteric fracture of right femur, subsequent encounter for closed fracture with routine healing: Secondary | ICD-10-CM

## 2017-04-21 LAB — BASIC METABOLIC PANEL
Anion gap: 15 (ref 5–15)
BUN: 30 mg/dL — AB (ref 6–20)
CHLORIDE: 97 mmol/L — AB (ref 101–111)
CO2: 22 mmol/L (ref 22–32)
CREATININE: 1.51 mg/dL — AB (ref 0.44–1.00)
Calcium: 8.6 mg/dL — ABNORMAL LOW (ref 8.9–10.3)
GFR calc Af Amer: 36 mL/min — ABNORMAL LOW (ref >60)
GFR calc non Af Amer: 31 mL/min — ABNORMAL LOW (ref >60)
GLUCOSE: 140 mg/dL — AB (ref 65–99)
POTASSIUM: 5.6 mmol/L — AB (ref 3.5–5.1)
Sodium: 134 mmol/L — ABNORMAL LOW (ref 135–145)

## 2017-04-21 LAB — POTASSIUM: POTASSIUM: 5 mmol/L (ref 3.5–5.1)

## 2017-04-21 MED ORDER — SODIUM CHLORIDE 0.9 % IV BOLUS (SEPSIS): 250.0000 mL | Freq: Once | INTRAVENOUS | Status: DC

## 2017-04-21 NOTE — Progress Notes (Signed)
Lab called RN and notified me that patients platelet count was (56) and when they looked at it under microscope they saw that was due to the fact that the platelets were too big therefore it was not a good sample and not accurate. MD notified.

## 2017-04-21 NOTE — Progress Notes (Signed)
   Subjective:  Patient reports pain as mild.  No events.  Objective:   VITALS:   Vitals:   04/20/17 2104 04/21/17 0554 04/21/17 0836 04/21/17 1500  BP: (!) 99/50 (!) 130/59  (!) 110/58  Pulse: 90 87  83  Resp: 19 20  19   Temp: 97.6 F (36.4 C) 99 F (37.2 C)  98.4 F (36.9 C)  TempSrc: Oral Oral  Oral  SpO2: 94% 98% 95% 94%  Weight:      Height:        Neurovascular intact Sensation intact distally Intact pulses distally Dorsiflexion/Plantar flexion intact Incision: dressing C/D/I and no drainage No cellulitis present Compartment soft   Lab Results  Component Value Date   WBC 13.6 (H) 04/21/2017   HGB 10.4 (L) 04/21/2017   HCT 32.7 (L) 04/21/2017   MCV 92.6 04/21/2017   PLT 56 (L) 04/21/2017     Assessment/Plan:  1 Day Post-Op   - Expected postop acute blood loss anemia - will monitor for symptoms - Up with PT/OT - DVT ppx - SCDs, ambulation, xarelto - WBAT operative extremity - Pain control - Discharge planning - SNF  Eduard Roux 04/21/2017, 3:44 PM [phone]

## 2017-04-21 NOTE — Progress Notes (Signed)
RN bladder scanned Pt at 1400 and pt was retaining only 13ml of urine MD notified and gave orders. RN started fluids at 125 and advised pt to drink lots of water.   Pt still has not voided NT bladder scanned pt at 1800 pt retaining 134ml of urine. RN notified MD awaiting further instruction.

## 2017-04-21 NOTE — Progress Notes (Addendum)
PROGRESS NOTE    Sarah Johnson  KDX:[id number] DOB: November 08, 1932 DOA: 04/17/2017 PCP: Jani Gravel, MD   Brief Narrative:  Patient is a 83 year old female, history of mild dementia, COPD, history of DVT on anticoagulation with toe walk, aneurysmal repair in 2009 presented with a fall and noted to have a right intertrochanteric hip fracture.  Orthopedics and cardiology consulted.   Assessment & Plan:   Active Problems:   History of aortic valve replacement   Closed displaced intertrochanteric fracture of right femur [facility])   Preoperative evaluation of a medical condition to rule out surgical contraindications (TAR required)  #1 right intertrochanteric hip fracture Secondary to mechanical fall.  Patient has been seen in consultation by orthopedics and initially placed in Buck's traction.  Patient has been seen by cardiology and feel patient will be okay to undergo surgery with no further ischemic workup needed.  Patient is status post treatment of peritrochanteric fracture with intramedullary implant 04/20/2017 per Dr. Erlinda Hong.  PT/OT today.  Continue current pain management. Per orthopedics.  2.  History of aortic valve replacement (bioprosthetic) Repeat 2D echo with right ventricular systolic pressure increased consistent with mild pulmonary hypertension.  Estimated EF of 60-65%.  No wall motion abnormalities.  PA peak pressure of 37 mmHg.  Patient has been seen in consultation by cardiology.  Continue cardiac regimen of Lipitor, Lopressor.  Cardiology following.  3.  Leukocytosis Likely a reactive leukocytosis.  Urinalysis unremarkable.  Urine cultures negative.  X-rays negative for any infiltrate.  Afebrile.  Leukocytosis trending down.  No need for antibiotics at this time.  Follow.  4.  Hypertension Continue current regimen beta-blocker.  5.  History of peripheral arterial disease Stable.  Diet modification.  Continue statin.  6.  COPD Stable.  Continue scheduled bronchodilators.   Oxygen as needed.  7.  History of DVT Occurred greater than 6 months ago.  Patient was on Xarelto with last dose 04/16/2017.  Xarelto was held in anticipation of surgery.  Has been resumed back on Xarelto.  Outpatient follow-up with PCP.  8.  Hyperkalemia Repeat potassium levels.   DVT prophylaxis: Xarelto. Code Status: DNR Family Communication: Updated patient.  No family at bedside.  Disposition Plan: Pending surgical repair per orthopedics.  Likely skilled nursing facility hopefully tomorrow if continued improvement in okay with orthopedic surgery.    Consultants:   Orthopedics: Dr. Ninfa Linden 04/18/2017  Cardiology: Dr. Gwenlyn Found 04/18/2017  Procedures:   2D echo 04/18/2017  Chest x-ray 04/17/2017  CT head without contrast 04/17/2017  Plain films of the right hip and pelvis 04/17/2017  IM nail peritrochanteric fracture, right per Dr.Xu 04/20/2017  Antimicrobials:   None   Subjective: Patient sitting in chair.  States right hip pain is controlled on current pain regimen.  No chest pain.  No shortness of breath.    Objective: Vitals:   04/20/17 2037 04/20/17 2104 04/21/17 0554 04/21/17 0836  BP:  (!) 99/50 (!) 130/59   Pulse:  90 87   Resp:  19 20   Temp:  97.6 F (36.4 C) 99 F (37.2 C)   TempSrc:  Oral Oral   SpO2: 95% 94% 98% 95%  Weight:      Height:        Intake/Output Summary (Last 24 hours) at 04/21/2017 1348 Last data filed at 04/21/2017 0900 Gross per 24 hour  Intake 1020 ml  Output 525 ml  Net 495 ml   Filed Weights   04/17/17 2232 04/19/17 0429 04/20/17 0500  Weight: 65.8  kg (145 lb) 82.3 kg (181 lb 7 oz) 83.2 kg (183 lb 6.8 oz)    Examination:  General exam: NAD.  Sitting in chair. Respiratory system: Lungs clear to auscultation bilaterally.  No wheezes, no crackles, no rhonchi.  Fair air movement.  Cardiovascular system: Regular rate and rhythm no murmurs rubs or gallops.  No JVD.  No lower extremity edema.   Gastrointestinal system:  Abdomen is nontender, nondistended, soft, positive bowel sounds. No rebound.  No guarding.  Central nervous system: Alert and oriented. No focal neurological deficits.  Moving extremities spontaneously. Extremities: No clubbing cyanosis or edema.  Skin: No rashes, lesions or ulcers Psychiatry: Judgement and insight appear fair. Mood & affect appropriate.     Data Reviewed: I have personally reviewed following labs and imaging studies  CBC: Recent Labs  Lab 04/17/17 2319 04/19/17 0715 04/20/17 0700 04/21/17 0938  WBC 22.3* 17.6* 16.8* 13.6*  NEUTROABS 19.8*  --  12.9*  --   HGB 13.3 11.8* 11.3* 10.4*  HCT 42.7 37.9 35.9* 32.7*  MCV 94.9 94.8 93.5 92.6  PLT 148* 93* PLATELET CLUMPS NOTED ON SMEAR, UNABLE TO ESTIMATE 56*   Basic Metabolic Panel: Recent Labs  Lab 04/17/17 2319 04/18/17 0526 04/19/17 0715 04/20/17 0700 04/21/17 0938  NA 140 140 135 132* 134*  K 4.3 4.6 5.0 4.7 5.6*  CL 101 102 98* 95* 97*  CO2 30 31 28 29 22   GLUCOSE 131* 148* 96 102* 140*  BUN 17 18 19  26* 30*  CREATININE 1.46* 1.41* 1.33* 1.62* 1.51*  CALCIUM 9.3 9.2 9.1 8.7* 8.6*  MG  --   --  1.7 3.0*  --    GFR: Estimated Creatinine Clearance: 27.5 mL/min (A) (by C-G formula based on SCr of 1.51 mg/dL (H)). Liver Function Tests: Recent Labs  Lab 04/18/17 0526  AST 26  ALT 14  ALKPHOS 82  BILITOT 0.7  PROT 6.3*  ALBUMIN 3.0*   No results for input(s): LIPASE, AMYLASE in the last 168 hours. No results for input(s): AMMONIA in the last 168 hours. Coagulation Profile: No results for input(s): INR, PROTIME in the last 168 hours. Cardiac Enzymes: No results for input(s): CKTOTAL, CKMB, CKMBINDEX, TROPONINI in the last 168 hours. BNP (last 3 results) No results for input(s): PROBNP in the last 8760 hours. HbA1C: No results for input(s): HGBA1C in the last 72 hours. CBG: No results for input(s): GLUCAP in the last 168 hours. Lipid Profile: No results for input(s): CHOL, HDL, LDLCALC, TRIG,  CHOLHDL, LDLDIRECT in the last 72 hours. Thyroid Function Tests: No results for input(s): TSH, T4TOTAL, FREET4, T3FREE, THYROIDAB in the last 72 hours. Anemia Panel: No results for input(s): VITAMINB12, FOLATE, FERRITIN, TIBC, IRON, RETICCTPCT in the last 72 hours. Sepsis Labs: No results for input(s): PROCALCITON, LATICACIDVEN in the last 168 hours.  Recent Results (from the past 240 hour(s))  Surgical pcr screen     Status: None   Collection Time: 04/18/17  3:00 AM  Result Value Ref Range Status   MRSA, PCR NEGATIVE NEGATIVE Final   Staphylococcus aureus NEGATIVE NEGATIVE Final    Comment: (NOTE) The Xpert SA Assay (FDA approved for NASAL specimens in patients 67 years of age and older), is one component of a comprehensive surveillance program. It is not intended to diagnose infection nor to guide or monitor treatment.   Urine Culture     Status: None   Collection Time: 04/18/17 12:55 PM  Result Value Ref Range Status   Specimen Description URINE,  CLEAN CATCH  Final   Special Requests NONE  Final   Culture NO GROWTH  Final   Report Status 04/19/2017 FINAL  Final         Radiology Studies: Dg C-arm 1-60 Min  Result Date: 04/20/2017 CLINICAL DATA:  Status post placement of an intramedullary nail for an acute intertrochanteric fracture of the right femur. Fluoro time reported is 2 minutes 26 seconds. Four fluoro spot images are reviewed. EXAM: DG C-ARM 61-120 MIN; RIGHT FEMUR 2 VIEWS COMPARISON:  Preoperative examination of the right hip of April 17, 2017 FINDINGS: The patient has undergone placement of an intramedullary nail with securing screws for treatment of the displaced intertrochanteric - sub trochanteric fracture. Alignment is now more nearly anatomic. IMPRESSION: ORIF for intertrochanteric- sub trochanteric fracture. No immediate postprocedure complication. Electronically Signed   By: David  Martinique M.D.   On: 04/20/2017 15:54   Dg Femur, Min 2 Views Right  Result  Date: 04/20/2017 CLINICAL DATA:  Status post placement of an intramedullary nail for an acute intertrochanteric fracture of the right femur. Fluoro time reported is 2 minutes 26 seconds. Four fluoro spot images are reviewed. EXAM: DG C-ARM 61-120 MIN; RIGHT FEMUR 2 VIEWS COMPARISON:  Preoperative examination of the right hip of April 17, 2017 FINDINGS: The patient has undergone placement of an intramedullary nail with securing screws for treatment of the displaced intertrochanteric - sub trochanteric fracture. Alignment is now more nearly anatomic. IMPRESSION: ORIF for intertrochanteric- sub trochanteric fracture. No immediate postprocedure complication. Electronically Signed   By: David  Martinique M.D.   On: 04/20/2017 15:54        Scheduled Meds: . acetaminophen  1,000 mg Oral TID  . atorvastatin  20 mg Oral Daily  . budesonide (PULMICORT) nebulizer solution  0.5 mg Nebulization BID  . donepezil  10 mg Oral QHS  . ipratropium-albuterol  3 mL Nebulization BID  . levothyroxine  137 mcg Oral QAC breakfast  . loratadine  10 mg Oral Daily  . memantine  10 mg Oral BID  . metoprolol tartrate  12.5 mg Oral BID  . multivitamin with minerals  1 tablet Oral Daily  . pantoprazole  40 mg Oral Q0600  . rivaroxaban  10 mg Oral QAC supper  . senna  1 tablet Oral Daily  . sertraline  50 mg Oral QHS  . sodium chloride flush  3 mL Intravenous Q12H  . thiamine  100 mg Oral Daily   Continuous Infusions: . sodium chloride 125 mL/hr at 04/20/17 1738  . lactated ringers 10 mL/hr at 04/20/17 1404  . methocarbamol (ROBAXIN)  IV       LOS: 3 days    Time spent: 35 mins    Irine Seal, MD [facility] Pager [phone]  If 7PM-7AM, please contact night-coverage [email or web address] Password [email or web address] 04/21/2017, 1:48 PM

## 2017-04-21 NOTE — Evaluation (Signed)
Physical Therapy Evaluation Patient Details Name: Sarah Johnson MRN: [id number] DOB: [birth year] Today's Date: 04/21/2017   History of Present Illness  83 y.o. female s/p Right IM Nail after fall at home 12/26. PMH includes: COPD, GERD, HTN, PE, DVT, CKD, Emphysema, Hyperlipidemia, Thoracic aortic aneurysm repair, back surgery.      Clinical Impression  Patient is s/p above surgery resulting in functional limitations due to the deficits listed below (see PT Problem List). PTA, pt was mod I with mobility living alone with caregiver support for ADLs. Upon eval, patient presenting with post op pain and weakness as well as lethargy and mild dementia that are limiting her safe mobility at this time. Currently mod-max A level for OOB mobility. Patient fatigues quickly during activity but should progress well with continued therapy. From a mobility stand point, not safe to return home. Patient will benefit from skilled PT to increase their independence and safety with mobility to allow discharge to the venue listed below.       Follow Up Recommendations SNF;Supervision/Assistance - 24 hour    Equipment Recommendations  None recommended by PT    Recommendations for Other Services       Precautions / Restrictions Precautions Precautions: Fall Precaution Comments: hx of falling. reviewed DVT prevention  Restrictions Weight Bearing Restrictions: Yes RLE Weight Bearing: Weight bearing as tolerated      Mobility  Bed Mobility Overal bed mobility: Needs Assistance Bed Mobility: Supine to Sit     Supine to sit: Mod assist;+2 for physical assistance     General bed mobility comments: Mod A with trunk and RLE asssit, scooting with bed pad. patient able to assist and follow cues through transfer utilizing bed rail for support.   Transfers Overall transfer level: Needs assistance Equipment used: Rolling walker (2 wheeled);2 person hand held assist Transfers: Sit to/from [employer] Sit to Stand: Mod assist Stand pivot transfers: Max assist;+2 physical assistance       General transfer comment: Mod A for sit to stand into walker, cues for hand placement and safety. Max A x2 for transfer into bedside chair using bed pad as sling as patient was not tolerating standing by this point due to fatigue.   Ambulation/Gait             General Gait Details: unable to perform this session  Stairs            Wheelchair Mobility    Modified Rankin (Stroke Patients Only)       Balance Overall balance assessment: History of Falls(Posterior lean when standing at bedisde in RW.)                                           Pertinent Vitals/Pain Pain Assessment: 0-10 Pain Score: 2  Pain Descriptors / Indicators: Aching;Discomfort;Grimacing Pain Intervention(s): Limited activity within patient's tolerance;Monitored during session    Home Living Family/patient expects to be discharged to:: Skilled nursing facility                 Additional Comments: lives alone    Prior Function Level of Independence: Independent;Needs assistance         Comments: assist with ADL(indepdent with ambulation)     Hand Dominance        Extremity/Trunk Assessment   Upper Extremity Assessment Upper Extremity Assessment: Generalized weakness;Defer to OT evaluation  Lower Extremity Assessment Lower Extremity Assessment: Generalized weakness       Communication   Communication: HOH  Cognition Arousal/Alertness: Lethargic Behavior During Therapy: Flat affect Overall Cognitive Status: History of cognitive impairments - at baseline                                 General Comments: Patient with mild dementia at baseline per daughter.       General Comments General comments (skin integrity, edema, etc.): patient satting in 80's at rest and with activity on 3.5 L supplamental O2. Patient refuses to wear O2 at baseline  per daugther. Cues for breathing techniqe     Exercises General Exercises - Lower Extremity Ankle Circles/Pumps: AAROM;Both;20 reps Quad Sets: AROM;Both;10 reps   Assessment/Plan    PT Assessment Patient needs continued PT services  PT Problem List Decreased strength;Decreased range of motion;Decreased activity tolerance;Decreased balance;Decreased mobility;Decreased coordination;Decreased cognition;Decreased knowledge of use of DME;Decreased safety awareness;Pain       PT Treatment Interventions DME instruction;Gait training;Stair training;Functional mobility training;Therapeutic activities;Therapeutic exercise    PT Goals (Current goals can be found in the Care Plan section)  Acute Rehab PT Goals Patient Stated Goal: to go to SNF.  PT Goal Formulation: With patient/family Time For Goal Achievement: 04/28/17 Potential to Achieve Goals: Good    Frequency Min 3X/week   Barriers to discharge Decreased caregiver support Lives alone    Co-evaluation PT/OT/SLP Co-Evaluation/Treatment: Yes Reason for Co-Treatment: Complexity of the patient's impairments (multi-system involvement);For patient/therapist safety PT goals addressed during session: Mobility/safety with mobility;Proper use of DME         AM-PAC PT "6 Clicks" Daily Activity  Outcome Measure Difficulty turning over in bed (including adjusting bedclothes, sheets and blankets)?: Unable Difficulty moving from lying on back to sitting on the side of the bed? : Unable Difficulty sitting down on and standing up from a chair with arms (e.g., wheelchair, bedside commode, etc,.)?: Unable Help needed moving to and from a bed to chair (including a wheelchair)?: A Lot Help needed walking in hospital room?: A Lot Help needed climbing 3-5 steps with a railing? : Total 6 Click Score: 8    End of Session Equipment Utilized During Treatment: Gait belt;Oxygen Activity Tolerance: Patient limited by fatigue;Patient limited by  lethargy Patient left: in chair;with call bell/phone within reach;with family/visitor present Nurse Communication: Mobility status PT Visit Diagnosis: Unsteadiness on feet (R26.81);Other abnormalities of gait and mobility (R26.89);Repeated falls (R29.6);Muscle weakness (generalized) (M62.81);History of falling (Z91.81);Pain Pain - Right/Left: Right Pain - part of body: Hip    Time: 8099-8338 PT Time Calculation (min) (ACUTE ONLY): 42 min   Charges:   PT Evaluation $PT Eval Moderate Complexity: 1 Mod PT Treatments $Therapeutic Activity: 8-22 mins   PT G Codes:        Reinaldo Berber, PT, DPT Acute Rehab Services Pager: [phone]    Reinaldo Berber 04/21/2017, 12:26 PM

## 2017-04-21 NOTE — Discharge Instructions (Signed)
° ° °  1. Change dressings as needed 2. May shower but keep incisions covered and dry 3. Take xarelto to prevent blood clots 4. Take stool softeners as needed 5. Take pain meds as needed     Information on my medicine - XARELTO (Rivaroxaban)  This medication education was reviewed with me or my healthcare representative as part of my discharge preparation.  The pharmacist that spoke with me during my hospital stay was:  Saundra Shelling, [facility]  Why was Xarelto prescribed for you? Xarelto was prescribed for you to reduce the risk of blood clots forming after orthopedic surgery. The medical term for these abnormal blood clots is venous thromboembolism (VTE).  What do you need to know about xarelto ? Take your Xarelto ONCE DAILY at the same time every day. You may take it either with or without food.  If you have difficulty swallowing the tablet whole, you may crush it and mix in applesauce just prior to taking your dose.  Take Xarelto exactly as prescribed by your doctor and DO NOT stop taking Xarelto without talking to the doctor who prescribed the medication.  Stopping without other VTE prevention medication to take the place of Xarelto may increase your risk of developing a clot.  After discharge, you should have regular check-up appointments with your healthcare provider that is prescribing your Xarelto.    What do you do if you miss a dose? If you miss a dose, take it as soon as you remember on the same day then continue your regularly scheduled once daily regimen the next day. Do not take two doses of Xarelto on the same day.   Important Safety Information A possible side effect of Xarelto is bleeding. You should call your healthcare provider right away if you experience any of the following: ? Bleeding from an injury or your nose that does not stop. ? Unusual colored urine (red or dark brown) or unusual colored stools (red or black). ? Unusual bruising for unknown  reasons. ? A serious fall or if you hit your head (even if there is no bleeding).  Some medicines may interact with Xarelto and might increase your risk of bleeding while on Xarelto. To help avoid this, consult your healthcare provider or pharmacist prior to using any new prescription or non-prescription medications, including herbals, vitamins, non-steroidal anti-inflammatory drugs (NSAIDs) and supplements.  This website has more information on Xarelto: [email or web address]

## 2017-04-21 NOTE — Evaluation (Signed)
Occupational Therapy Evaluation Patient Details Name: Sarah Johnson MRN: [id number] DOB: 04-13-1933 Today's Date: 04/21/2017    History of Present Illness 83 y.o. female s/p Right IM Nail after fall at home 12/26. PMH includes: COPD, GERD, HTN, PE, DVT, CKD, Emphysema, Hyperlipidemia, Thoracic aortic aneurysm repair, back surgery.    Clinical Impression   This 83 y/o F presents with the above. Pt lives alone, at baseline is independent with functional mobility, was receiving intermittent assist from family for ADL/iADL completion. Pt presents with generalized weakness and decreased functional performance; currently requires MaxA +2 for stand pivot transfer to recliner and MaxA for LB ADLs. Pt will benefit from continued acute OT services and recommend additional OT services in SNF setting after discharge to maximize Pt's safety and independence with ADLs and mobility.     Follow Up Recommendations  SNF;Supervision/Assistance - 24 hour    Equipment Recommendations  Other (comment)(defer to next venue )           Precautions / Restrictions Precautions Precautions: Fall Precaution Comments: hx of falling. reviewed DVT prevention  Restrictions Weight Bearing Restrictions: Yes RLE Weight Bearing: Weight bearing as tolerated      Mobility Bed Mobility Overal bed mobility: Needs Assistance Bed Mobility: Supine to Sit     Supine to sit: Mod assist;+2 for physical assistance     General bed mobility comments: Mod A with trunk and RLE asssit, scooting with bed pad. patient able to assist and follow cues through transfer utilizing bed rail for support.   Transfers Overall transfer level: Needs assistance Equipment used: Rolling walker (2 wheeled);2 person hand held assist Transfers: Sit to/from [facility] Sit to Stand: Mod assist Stand pivot transfers: Max assist;+2 physical assistance       General transfer comment: Mod A for sit to stand into walker, cues  for hand placement, posture and safety. Max A x2 for transfer into bedside chair using bed pad as sling as patient was not tolerating standing by this point due to fatigue.     Balance Overall balance assessment: History of Falls;Needs assistance Sitting-balance support: Feet supported Sitting balance-Leahy Scale: Fair   Postural control: Posterior lean   Standing balance-Leahy Scale: Poor Standing balance comment: Pt with posterior lean while standing at RW; reliant on UE support/external support from therapist for maintaining standing balance                           ADL either performed or assessed with clinical judgement   ADL Overall ADL's : Needs assistance/impaired Eating/Feeding: Set up;Sitting   Grooming: Set up;Sitting   Upper Body Bathing: Minimal assistance;Sitting   Lower Body Bathing: Moderate assistance;+2 for physical assistance;+2 for safety/equipment;Sit to/from stand   Upper Body Dressing : Minimal assistance;Sitting   Lower Body Dressing: Maximal assistance;+2 for physical assistance;+2 for safety/equipment;Sit to/from stand   Toilet Transfer: Maximal assistance;+2 for safety/equipment;+2 for physical assistance;Stand-pivot;BSC Toilet Transfer Details (indicate cue type and reason): simulated in bed to chair transfer  Emanuel and Hygiene: Maximal assistance;+2 for physical assistance;+2 for safety/equipment;Sit to/from stand       Functional mobility during ADLs: Maximal assistance;+2 for physical assistance;+2 for safety/equipment;Rolling walker General ADL Comments: Pt stood x1 from EOB with RW and ModA, able to maintain standing approx 30 sec prior to needing to return to sitting; completed stand pivot EOB>recliner with MaxA +2 (HHA)      Vision   Additional Comments: Pt with difficulty fully  opening R eye - unsure whether this is baseline or more due to fatigue/having just woken up                 Pertinent  Vitals/Pain Pain Assessment: 0-10 Pain Score: 2  Pain Location: RLE  Pain Descriptors / Indicators: Aching;Discomfort;Grimacing Pain Intervention(s): Limited activity within patient's tolerance;Monitored during session     Hand Dominance Right   Extremity/Trunk Assessment Upper Extremity Assessment Upper Extremity Assessment: Generalized weakness   Lower Extremity Assessment Lower Extremity Assessment: Defer to PT evaluation       Communication Communication Communication: HOH   Cognition Arousal/Alertness: Lethargic Behavior During Therapy: Flat affect Overall Cognitive Status: History of cognitive impairments - at baseline                                 General Comments: Patient with mild dementia at baseline per daughter.    General Comments  patient satting in 80's at rest and with activity on 3.5 L supplamental O2. Patient refuses to wear O2 at baseline per daugther. Cues for breathing techniqe     Exercises Exercises: General Lower Extremity General Exercises - Lower Extremity Ankle Circles/Pumps: AAROM;Both;20 reps Quad Sets: AROM;Both;10 reps   Shoulder Instructions      Home Living Family/patient expects to be discharged to:: Skilled nursing facility                                 Additional Comments: lives alone      Prior Functioning/Environment Level of Independence: Independent;Needs assistance  Gait / Transfers Assistance Needed: independent with mobility  ADL's / Homemaking Assistance Needed: receives assist from family for some ADLs and for completing iADLs   Comments: assist with ADL(indepdent with ambulation)        OT Problem List: Decreased strength;Impaired balance (sitting and/or standing);Pain;Decreased range of motion;Decreased activity tolerance;Decreased knowledge of use of DME or AE      OT Treatment/Interventions: Self-care/ADL training;DME and/or AE instruction;Balance training;Therapeutic  activities;Therapeutic exercise;Energy conservation;Patient/family education    OT Goals(Current goals can be found in the care plan section) Acute Rehab OT Goals Patient Stated Goal: to go to SNF.  OT Goal Formulation: With patient Time For Goal Achievement: 05/05/17 Potential to Achieve Goals: Good  OT Frequency: Min 2X/week               Co-evaluation PT/OT/SLP Co-Evaluation/Treatment: Yes Reason for Co-Treatment: Complexity of the patient's impairments (multi-system involvement);For patient/therapist safety PT goals addressed during session: Mobility/safety with mobility;Proper use of DME OT goals addressed during session: ADL's and self-care;Proper use of Adaptive equipment and DME      AM-PAC PT "6 Clicks" Daily Activity     Outcome Measure Help from another person eating meals?: None Help from another person taking care of personal grooming?: A Little Help from another person toileting, which includes using toliet, bedpan, or urinal?: A Lot Help from another person bathing (including washing, rinsing, drying)?: A Lot Help from another person to put on and taking off regular upper body clothing?: A Little Help from another person to put on and taking off regular lower body clothing?: A Lot 6 Click Score: 16   End of Session Equipment Utilized During Treatment: Gait belt;Rolling walker;Oxygen Nurse Communication: Mobility status  Activity Tolerance: Patient tolerated treatment well Patient left: in chair;with call bell/phone within reach;with nursing/sitter in room;with family/visitor present  OT Visit Diagnosis: History of falling (Z91.81);Unsteadiness on feet (R26.81);Muscle weakness (generalized) (M62.81)                Time: 0929-5747 OT Time Calculation (min): 24 min Charges:  OT General Charges $OT Visit: 1 Visit OT Evaluation $OT Eval Moderate Complexity: 1 Mod G-Codes:     Lou Cal, OT Pager [phone] 04/21/2017   Raymondo Band 04/21/2017,  12:43 PM

## 2017-04-22 DIAGNOSIS — Z79899 Other long term (current) drug therapy: Secondary | ICD-10-CM | POA: Diagnosis not present

## 2017-04-22 DIAGNOSIS — Z4789 Encounter for other orthopedic aftercare: Secondary | ICD-10-CM | POA: Diagnosis not present

## 2017-04-22 DIAGNOSIS — R41841 Cognitive communication deficit: Secondary | ICD-10-CM | POA: Diagnosis not present

## 2017-04-22 DIAGNOSIS — J449 Chronic obstructive pulmonary disease, unspecified: Secondary | ICD-10-CM | POA: Diagnosis not present

## 2017-04-22 DIAGNOSIS — M6281 Muscle weakness (generalized): Secondary | ICD-10-CM | POA: Diagnosis not present

## 2017-04-22 DIAGNOSIS — J80 Acute respiratory distress syndrome: Secondary | ICD-10-CM | POA: Diagnosis not present

## 2017-04-22 DIAGNOSIS — G8911 Acute pain due to trauma: Secondary | ICD-10-CM | POA: Diagnosis not present

## 2017-04-22 DIAGNOSIS — N189 Chronic kidney disease, unspecified: Secondary | ICD-10-CM | POA: Diagnosis not present

## 2017-04-22 DIAGNOSIS — R278 Other lack of coordination: Secondary | ICD-10-CM | POA: Diagnosis not present

## 2017-04-22 DIAGNOSIS — Z86718 Personal history of other venous thrombosis and embolism: Secondary | ICD-10-CM | POA: Diagnosis not present

## 2017-04-22 DIAGNOSIS — Z419 Encounter for procedure for purposes other than remedying health state, unspecified: Secondary | ICD-10-CM | POA: Diagnosis not present

## 2017-04-22 DIAGNOSIS — Z01818 Encounter for other preprocedural examination: Secondary | ICD-10-CM | POA: Diagnosis not present

## 2017-04-22 DIAGNOSIS — S72009A Fracture of unspecified part of neck of unspecified femur, initial encounter for closed fracture: Secondary | ICD-10-CM | POA: Diagnosis not present

## 2017-04-22 DIAGNOSIS — Z87891 Personal history of nicotine dependence: Secondary | ICD-10-CM | POA: Diagnosis not present

## 2017-04-22 DIAGNOSIS — F0390 Unspecified dementia without behavioral disturbance: Secondary | ICD-10-CM | POA: Diagnosis not present

## 2017-04-22 DIAGNOSIS — M7989 Other specified soft tissue disorders: Secondary | ICD-10-CM | POA: Diagnosis not present

## 2017-04-22 DIAGNOSIS — Z952 Presence of prosthetic heart valve: Secondary | ICD-10-CM | POA: Diagnosis not present

## 2017-04-22 DIAGNOSIS — G309 Alzheimer's disease, unspecified: Secondary | ICD-10-CM | POA: Diagnosis not present

## 2017-04-22 DIAGNOSIS — S72141D Displaced intertrochanteric fracture of right femur, subsequent encounter for closed fracture with routine healing: Secondary | ICD-10-CM | POA: Diagnosis not present

## 2017-04-22 DIAGNOSIS — Z86711 Personal history of pulmonary embolism: Secondary | ICD-10-CM | POA: Diagnosis not present

## 2017-04-22 DIAGNOSIS — M6389 Disorders of muscle in diseases classified elsewhere, multiple sites: Secondary | ICD-10-CM | POA: Diagnosis not present

## 2017-04-22 DIAGNOSIS — R05 Cough: Secondary | ICD-10-CM | POA: Diagnosis not present

## 2017-04-22 DIAGNOSIS — M25551 Pain in right hip: Secondary | ICD-10-CM | POA: Diagnosis not present

## 2017-04-22 DIAGNOSIS — K219 Gastro-esophageal reflux disease without esophagitis: Secondary | ICD-10-CM | POA: Diagnosis not present

## 2017-04-22 DIAGNOSIS — R918 Other nonspecific abnormal finding of lung field: Secondary | ICD-10-CM | POA: Diagnosis not present

## 2017-04-22 DIAGNOSIS — I10 Essential (primary) hypertension: Secondary | ICD-10-CM | POA: Diagnosis not present

## 2017-04-22 DIAGNOSIS — S72143D Displaced intertrochanteric fracture of unspecified femur, subsequent encounter for closed fracture with routine healing: Secondary | ICD-10-CM | POA: Diagnosis not present

## 2017-04-22 DIAGNOSIS — S72001A Fracture of unspecified part of neck of right femur, initial encounter for closed fracture: Secondary | ICD-10-CM | POA: Diagnosis not present

## 2017-04-22 DIAGNOSIS — R2681 Unsteadiness on feet: Secondary | ICD-10-CM | POA: Diagnosis not present

## 2017-04-22 DIAGNOSIS — R0902 Hypoxemia: Secondary | ICD-10-CM | POA: Diagnosis not present

## 2017-04-22 DIAGNOSIS — R0602 Shortness of breath: Secondary | ICD-10-CM | POA: Diagnosis not present

## 2017-04-22 DIAGNOSIS — Z5189 Encounter for other specified aftercare: Secondary | ICD-10-CM | POA: Diagnosis not present

## 2017-04-22 DIAGNOSIS — I2699 Other pulmonary embolism without acute cor pulmonale: Secondary | ICD-10-CM | POA: Diagnosis not present

## 2017-04-22 DIAGNOSIS — R2689 Other abnormalities of gait and mobility: Secondary | ICD-10-CM | POA: Diagnosis not present

## 2017-04-22 DIAGNOSIS — S72001D Fracture of unspecified part of neck of right femur, subsequent encounter for closed fracture with routine healing: Secondary | ICD-10-CM | POA: Diagnosis not present

## 2017-04-22 LAB — CBC
HEMATOCRIT: 32.7 % — AB (ref 36.0–46.0)
HEMATOCRIT: 30.2 % — AB (ref 36.0–46.0)
HEMOGLOBIN: 10.4 g/dL — AB (ref 12.0–15.0)
HEMOGLOBIN: 9.4 g/dL — AB (ref 12.0–15.0)
MCH: 29.5 pg (ref 26.0–34.0)
MCH: 29 pg (ref 26.0–34.0)
MCHC: 31.8 g/dL (ref 30.0–36.0)
MCHC: 31.1 g/dL (ref 30.0–36.0)
MCV: 92.6 fL (ref 78.0–100.0)
MCV: 93.2 fL (ref 78.0–100.0)
Platelets: UNDETERMINED 10*3/uL (ref 150–400)
Platelets: UNDETERMINED 10*3/uL (ref 150–400)
RBC: 3.53 MIL/uL — AB (ref 3.87–5.11)
RBC: 3.24 MIL/uL — AB (ref 3.87–5.11)
RDW: 16.5 % — ABNORMAL HIGH (ref 11.5–15.5)
RDW: 16.8 % — ABNORMAL HIGH (ref 11.5–15.5)
WBC: 13.6 10*3/uL — ABNORMAL HIGH (ref 4.0–10.5)
WBC: 14.8 10*3/uL — AB (ref 4.0–10.5)

## 2017-04-22 LAB — BASIC METABOLIC PANEL
ANION GAP: 8 (ref 5–15)
BUN: 33 mg/dL — ABNORMAL HIGH (ref 6–20)
CHLORIDE: 101 mmol/L (ref 101–111)
CO2: 26 mmol/L (ref 22–32)
Calcium: 8.7 mg/dL — ABNORMAL LOW (ref 8.9–10.3)
Creatinine, Ser: 1.51 mg/dL — ABNORMAL HIGH (ref 0.44–1.00)
GFR calc non Af Amer: 31 mL/min — ABNORMAL LOW (ref >60)
GFR, EST AFRICAN AMERICAN: 36 mL/min — AB (ref >60)
Glucose, Bld: 102 mg/dL — ABNORMAL HIGH (ref 65–99)
Potassium: 4.6 mmol/L (ref 3.5–5.1)
Sodium: 135 mmol/L (ref 135–145)

## 2017-04-22 MED ORDER — PANTOPRAZOLE SODIUM 40 MG PO TBEC: 40.0000 mg | DELAYED_RELEASE_TABLET | Freq: Every day | ORAL | 0 refills | Status: DC

## 2017-04-22 NOTE — Discharge Summary (Signed)
Physician Discharge Summary  LAKELYN STRAUS QAS:[id number] DOB: January 27, 1933 DOA: 04/17/2017  PCP: Jani Gravel, MD  Admit date: 04/17/2017 Discharge date: 04/22/2017  Admitted From: Home Disposition:  Skilled Nursing  Recommendations for Outpatient Follow-up:  1. Follow up with PCP in 1-2 weeks 2. Follow up with Orthopedic Surgery as scheduled  Discharge Condition:Stable CODE STATUS:DNR Diet recommendation: Heart healthy   Brief/Interim Summary: Patient is a 83 year old female, history of mild dementia, COPD, history of DVT on anticoagulation with toe walk, aneurysmal repair in 2009 presented with a fall and noted to have a right intertrochanteric hip fracture.  Orthopedics and cardiology consulted.  #1 right intertrochanteric hip fracture Secondary to mechanical fall.  Patient has been seen in consultation by orthopedics and initially placed in Buck's traction.  Patient has been seen by cardiology and feel patient will be okay to undergo surgery with no further ischemic workup needed.  Patient is status post treatment of peritrochanteric fracture with intramedullary implant 04/20/2017 per Dr. Erlinda Hong.  Patient seen by PT/OT. Remained stable this admission  2.  History of aortic valve replacement (bioprosthetic) Repeat 2D echo with right ventricular systolic pressure increased consistent with mild pulmonary hypertension.  Estimated EF of 60-65%.  No wall motion abnormalities.  PA peak pressure of 37 mmHg.  Patient has been seen in consultation by cardiology.  Continue cardiac regimen of Lipitor, Lopressor.  Remained stable.  3.  Leukocytosis Likely a reactive leukocytosis.  Urinalysis unremarkable.  Urine cultures negative.  X-rays negative for any infiltrate.  Afebrile.  Leukocytosis trending down.  No need for antibiotics at this time. Remained stable  4.  Hypertension Continue current regimen beta-blocker. Remained medically stable  5.  History of peripheral arterial disease Stable.   Diet modification.  Continue statin.  6.  COPD Stable.  Continue scheduled bronchodilators.  Oxygen as needed.  7.  History of DVT Occurred greater than 6 months ago.  Patient was on Xarelto with last dose 04/16/2017.  Xarelto was held in anticipation of surgery.  Has been resumed back on Xarelto.  Outpatient follow-up with PCP.  8.  Hyperkalemia Normalized    Discharge Diagnoses:  Active Problems:   History of aortic valve replacement   Closed displaced intertrochanteric fracture of right femur [facility])   Preoperative evaluation of a medical condition to rule out surgical contraindications (TAR required)    Discharge Instructions  Discharge Instructions    Weight bearing as tolerated   Complete by:  As directed      Allergies as of 04/22/2017      Reactions   Codeine Other (See Comments)   Pt. States it has been too long to remember the reaction    Penicillins Other (See Comments)   Pt. Does not recall the reaction.  Has patient had a PCN reaction causing immediate rash, facial/tongue/throat swelling, SOB or lightheadedness with hypotension:  Has patient had a PCN reaction causing severe rash involving mucus membranes or skin necrosis: Has patient had a PCN reaction that required hospitalization  Has patient had a PCN reaction occurring within the last 10 years:  If all of the above answers are "NO", then may proceed with Cephalosporin use.      Medication List    TAKE these medications   atorvastatin 20 MG tablet Commonly known as:  LIPITOR TAKE 1 TABLET BY MOUTH EVERY DAY   CEREFOLIN PO Take 1 tablet by mouth daily.   donepezil 10 MG tablet Commonly known as:  ARICEPT TAKE 1 TABLET (10 MG TOTAL)  BY MOUTH AT BEDTIME.   ibuprofen 200 MG tablet Commonly known as:  ADVIL,MOTRIN Take 2 tablets (400 mg total) by mouth daily as needed for moderate pain.   ipratropium-albuterol 0.5-2.5 (3) MG/3ML Soln Commonly known as:  DUONEB Take 3 mLs by nebulization 2  (two) times daily. Dx: J44.9 What changed:    when to take this  reasons to take this  additional instructions   levothyroxine 112 MCG tablet Commonly known as:  SYNTHROID, LEVOTHROID Take 137 mcg by mouth daily.   memantine 10 MG tablet Commonly known as:  NAMENDA Take 10 mg by mouth 2 (two) times daily.   metoprolol tartrate 25 MG tablet Commonly known as:  LOPRESSOR Take 0.5 tablets (12.5 mg total) by mouth 2 (two) times daily.   mometasone-formoterol 100-5 MCG/ACT Aero Commonly known as:  DULERA INHALE 2 PUFFS INTO THE LUNGS 2 TIMES DAILY. RINSE MOUTH AFTER EACH USE   multivitamin with minerals Tabs tablet Take 1 tablet by mouth daily.   oxyCODONE 5 MG immediate release tablet Commonly known as:  Oxy IR/ROXICODONE Take 1-3 tablets (5-15 mg total) by mouth every 4 (four) hours as needed.   pantoprazole 40 MG tablet Commonly known as:  PROTONIX Take 1 tablet (40 mg total) by mouth daily at 6 (six) AM. Start taking on:  04/23/2017   PROAIR HFA 108 (90 Base) MCG/ACT inhaler Generic drug:  albuterol Inhale 2 puffs into the lungs every 6 (six) hours as needed. What changed:  reasons to take this   Rivaroxaban 15 MG Tabs tablet Commonly known as:  XARELTO Take 1 tablet (15 mg total) by mouth 2 (two) times daily with a meal. What changed:    when to take this  Another medication with the same name was removed. Continue taking this medication, and follow the directions you see here.   saccharomyces boulardii 250 MG capsule Commonly known as:  FLORASTOR Take 1 capsule (250 mg total) by mouth 2 (two) times daily.   sertraline 50 MG tablet Commonly known as:  ZOLOFT Take 50 mg by mouth at bedtime.   thiamine 100 MG tablet Take 1 tablet (100 mg total) by mouth daily.            Discharge Care Instructions  (From admission, onward)        Start     Ordered   04/20/17 0000  Weight bearing as tolerated     04/20/17 1548      Contact information for  follow-up providers    Leandrew Koyanagi, MD Follow up in 2 week(s).   Specialty:  Orthopedic Surgery Why:  For suture removal, For wound re-check Contact information: [place] [address] [phone]            Contact information for after-discharge care    Destination    [facility] Follow up.   Service:  Skilled Nursing Contact information: [place] [phone]                 Allergies  Allergen Reactions  . Codeine Other (See Comments)    Pt. States it has been too long to remember the reaction   . Penicillins Other (See Comments)    Pt. Does not recall the reaction.  Has patient had a PCN reaction causing immediate rash, facial/tongue/throat swelling, SOB or lightheadedness with hypotension:  Has patient had a PCN reaction causing severe rash involving mucus membranes or skin necrosis:  Has patient had a PCN reaction that required hospitalization  Has patient had a PCN reaction occurring within the last 10 years:  If all of the above answers are "NO", then may proceed with Cephalosporin use.     Consultations:  Orthopedic Surgery  Cardiology  Procedures/Studies: Dg Chest 1 View  Result Date: 04/17/2017 CLINICAL DATA:  Operative radiograph the 4 right hip fracture repair EXAM: CHEST 1 VIEW COMPARISON:  None. FINDINGS: Median sternotomy wires. Cardiomegaly. Mild interstitial pulmonary edema. No pleural effusion. No pneumothorax. No focal consolidation. IMPRESSION: Cardiomegaly and mild interstitial pulmonary edema. Electronically Signed   By: Ulyses Jarred M.D.   On: 04/17/2017 23:19   Ct Head Wo Contrast  Result Date: 04/18/2017 CLINICAL DATA:  Found on floor, with concern for head injury. EXAM: CT HEAD WITHOUT CONTRAST TECHNIQUE: Contiguous axial images were obtained from the base of the skull through the vertex without intravenous contrast. COMPARISON:  CT  of the head performed 11/28/2015, and MRI of the brain performed 02/10/2015 FINDINGS: Brain: No evidence of acute infarction, hemorrhage, hydrocephalus, extra-axial collection or mass lesion/mass effect. Prominence of the ventricles and sulci reflects mild cortical volume loss. Mild cerebellar atrophy is noted. Scattered periventricular and subcortical white matter change likely reflects small vessel ischemic microangiopathy. The brainstem and fourth ventricle are within normal limits. The basal ganglia are unremarkable in appearance. The cerebral hemispheres demonstrate grossly normal gray-white differentiation. No mass effect or midline shift is seen. Vascular: No hyperdense vessel or unexpected calcification. Skull: There is no evidence of fracture; visualized osseous structures are unremarkable in appearance. Sinuses/Orbits: The orbits are within normal limits. There is mild partial opacification of the left mastoid air cells. The paranasal sinuses and right mastoid air cells are well-aerated. Other: No significant soft tissue abnormalities are seen. IMPRESSION: 1. No acute intracranial pathology seen on CT. 2. Mild cortical volume loss and scattered small vessel ischemic microangiopathy. 3. Mild partial opacification of the left mastoid air cells. Electronically Signed   By: Garald Balding M.D.   On: 04/18/2017 00:11   Dg C-arm 1-60 Min  Result Date: 04/20/2017 CLINICAL DATA:  Status post placement of an intramedullary nail for an acute intertrochanteric fracture of the right femur. Fluoro time reported is 2 minutes 26 seconds. Four fluoro spot images are reviewed. EXAM: DG C-ARM 61-120 MIN; RIGHT FEMUR 2 VIEWS COMPARISON:  Preoperative examination of the right hip of April 17, 2017 FINDINGS: The patient has undergone placement of an intramedullary nail with securing screws for treatment of the displaced intertrochanteric - sub trochanteric fracture. Alignment is now more nearly anatomic. IMPRESSION:  ORIF for intertrochanteric- sub trochanteric fracture. No immediate postprocedure complication. Electronically Signed   By: David  Martinique M.D.   On: 04/20/2017 15:54   Dg Hip Unilat  With Pelvis 2-3 Views Right  Result Date: 04/17/2017 CLINICAL DATA:  Fall with right hip pain. EXAM: DG HIP (WITH OR WITHOUT PELVIS) 2-3V RIGHT COMPARISON:  None. FINDINGS: There is a comminuted, medially angulated fracture of the intertrochanteric right femur. There is a medially displaced butterfly fragment inferior to the femoral neck. Left hip is normal. No pelvic fracture or diastasis. The right femoroacetabular joint remains approximated. IMPRESSION: Comminuted, medially angulated intertrochanteric fracture of the right femur. Electronically Signed   By: Ulyses Jarred M.D.   On: 04/17/2017 23:18   Dg Femur, Min 2 Views Right  Result Date: 04/20/2017 CLINICAL DATA:  Status post placement of an intramedullary nail for an acute intertrochanteric fracture of the right femur. Fluoro  time reported is 2 minutes 26 seconds. Four fluoro spot images are reviewed. EXAM: DG C-ARM 61-120 MIN; RIGHT FEMUR 2 VIEWS COMPARISON:  Preoperative examination of the right hip of April 17, 2017 FINDINGS: The patient has undergone placement of an intramedullary nail with securing screws for treatment of the displaced intertrochanteric - sub trochanteric fracture. Alignment is now more nearly anatomic. IMPRESSION: ORIF for intertrochanteric- sub trochanteric fracture. No immediate postprocedure complication. Electronically Signed   By: David  Martinique M.D.   On: 04/20/2017 15:54    Subjective: Without complaints  Discharge Exam: Vitals:   04/22/17 0522 04/22/17 0824  BP: (!) 121/58   Pulse: 89   Resp:    Temp: 98.8 F (37.1 C)   SpO2: 98% 92%   Vitals:   04/21/17 1500 04/21/17 2140 04/22/17 0522 04/22/17 0824  BP: (!) 110/58 119/62 (!) 121/58   Pulse: 83 76 89   Resp: 19     Temp: 98.4 F (36.9 C) 98.1 F (36.7 C) 98.8 F  (37.1 C)   TempSrc: Oral Axillary Oral   SpO2: 94% 97% 98% 92%  Weight:      Height:        General: Pt is alert, awake, not in acute distress Cardiovascular: RRR, S1/S2 +, no rubs, no gallops Respiratory: CTA bilaterally, no wheezing, no rhonchi Abdominal: Soft, NT, ND, bowel sounds + Extremities: no edema, no cyanosis   The results of significant diagnostics from this hospitalization (including imaging, microbiology, ancillary and laboratory) are listed below for reference.     Microbiology: Recent Results (from the past 240 hour(s))  Surgical pcr screen     Status: None   Collection Time: 04/18/17  3:00 AM  Result Value Ref Range Status   MRSA, PCR NEGATIVE NEGATIVE Final   Staphylococcus aureus NEGATIVE NEGATIVE Final    Comment: (NOTE) The Xpert SA Assay (FDA approved for NASAL specimens in patients 64 years of age and older), is one component of a comprehensive surveillance program. It is not intended to diagnose infection nor to guide or monitor treatment.   Urine Culture     Status: None   Collection Time: 04/18/17 12:55 PM  Result Value Ref Range Status   Specimen Description URINE, CLEAN CATCH  Final   Special Requests NONE  Final   Culture NO GROWTH  Final   Report Status 04/19/2017 FINAL  Final     Labs: BNP (last 3 results) Recent Labs    04/18/17 0526  BNP 397.6*   Basic Metabolic Panel: Recent Labs  Lab 04/18/17 0526 04/19/17 0715 04/20/17 0700 04/21/17 0938 04/21/17 1453 04/22/17 0701  NA 140 135 132* 134*  --  135  K 4.6 5.0 4.7 5.6* 5.0 4.6  CL 102 98* 95* 97*  --  101  CO2 31 28 29 22   --  26  GLUCOSE 148* 96 102* 140*  --  102*  BUN 18 19 26* 30*  --  33*  CREATININE 1.41* 1.33* 1.62* 1.51*  --  1.51*  CALCIUM 9.2 9.1 8.7* 8.6*  --  8.7*  MG  --  1.7 3.0*  --   --   --    Liver Function Tests: Recent Labs  Lab 04/18/17 0526  AST 26  ALT 14  ALKPHOS 82  BILITOT 0.7  PROT 6.3*  ALBUMIN 3.0*   No results for input(s):  LIPASE, AMYLASE in the last 168 hours. No results for input(s): AMMONIA in the last 168 hours. CBC: Recent Labs  Lab  04/17/17 2319 04/19/17 0715 04/20/17 0700 04/21/17 0938 04/22/17 0701  WBC 22.3* 17.6* 16.8* 13.6* 14.8*  NEUTROABS 19.8*  --  12.9*  --   --   HGB 13.3 11.8* 11.3* 10.4* 9.4*  HCT 42.7 37.9 35.9* 32.7* 30.2*  MCV 94.9 94.8 93.5 92.6 93.2  PLT 148* 93* PLATELET CLUMPS NOTED ON SMEAR, UNABLE TO ESTIMATE PLATELET CLUMPS NOTED ON SMEAR, UNABLE TO ESTIMATE PLATELET CLUMPS NOTED ON SMEAR, UNABLE TO ESTIMATE   Cardiac Enzymes: No results for input(s): CKTOTAL, CKMB, CKMBINDEX, TROPONINI in the last 168 hours. BNP: Invalid input(s): POCBNP CBG: No results for input(s): GLUCAP in the last 168 hours. D-Dimer No results for input(s): DDIMER in the last 72 hours. Hgb A1c No results for input(s): HGBA1C in the last 72 hours. Lipid Profile No results for input(s): CHOL, HDL, LDLCALC, TRIG, CHOLHDL, LDLDIRECT in the last 72 hours. Thyroid function studies No results for input(s): TSH, T4TOTAL, T3FREE, THYROIDAB in the last 72 hours.  Invalid input(s): FREET3 Anemia work up No results for input(s): VITAMINB12, FOLATE, FERRITIN, TIBC, IRON, RETICCTPCT in the last 72 hours. Urinalysis    Component Value Date/Time   COLORURINE YELLOW 04/18/2017 [place] 04/18/2017 1255   LABSPEC 1.020 04/18/2017 1255   PHURINE 5.0 04/18/2017 1255   GLUCOSEU NEGATIVE 04/18/2017 1255   HGBUR NEGATIVE 04/18/2017 1255   BILIRUBINUR NEGATIVE 04/18/2017 1255   KETONESUR NEGATIVE 04/18/2017 1255   PROTEINUR NEGATIVE 04/18/2017 1255   UROBILINOGEN 0.2 02/04/2015 2036   NITRITE NEGATIVE 04/18/2017 1255   LEUKOCYTESUR NEGATIVE 04/18/2017 1255   Sepsis Labs Invalid input(s): PROCALCITONIN,  WBC,  LACTICIDVEN Microbiology Recent Results (from the past 240 hour(s))  Surgical pcr screen     Status: None   Collection Time: 04/18/17  3:00 AM  Result Value Ref Range Status    MRSA, PCR NEGATIVE NEGATIVE Final   Staphylococcus aureus NEGATIVE NEGATIVE Final    Comment: (NOTE) The Xpert SA Assay (FDA approved for NASAL specimens in patients 108 years of age and older), is one component of a comprehensive surveillance program. It is not intended to diagnose infection nor to guide or monitor treatment.   Urine Culture     Status: None   Collection Time: 04/18/17 12:55 PM  Result Value Ref Range Status   Specimen Description URINE, CLEAN CATCH  Final   Special Requests NONE  Final   Culture NO GROWTH  Final   Report Status 04/19/2017 FINAL  Final     SIGNED:   Marylu Lund, MD  [facility] 04/22/2017, 1:13 PM  If 7PM-7AM, please contact night-coverage [email or web address] Password [email or web address]

## 2017-04-22 NOTE — Care Management Important Message (Signed)
Important Message  Patient Details  Name: Sarah Johnson MRN: [id number] Date of Birth: 02/18/1933   Medicare Important Message Given:  Yes    Sylwia Cuervo [address] 04/22/2017, 12:45 PM

## 2017-04-22 NOTE — Progress Notes (Signed)
Spoke with Sharyn Lull from [place], and gave report. Sharyn Lull RN notified [place] RN to remove foley before transfer. They will document any output.

## 2017-04-22 NOTE — Progress Notes (Signed)
Sarah Johnson to be D/C'd Skilled nursing facility per MD order.  Discussed prescriptions and follow up appointments with the patient. Prescriptions given to patient, medication list explained in detail. Pt verbalized understanding.  Allergies as of 04/22/2017      Reactions   Codeine Other (See Comments)   Pt. States it has been too long to remember the reaction    Penicillins Other (See Comments)   Pt. Does not recall the reaction.  Has patient had a PCN reaction causing immediate rash, facial/tongue/throat swelling, SOB or lightheadedness with hypotension:  Has patient had a PCN reaction causing severe rash involving mucus membranes or skin necrosis: Has patient had a PCN reaction that required hospitalization  Has patient had a PCN reaction occurring within the last 10 years:  If all of the above answers are "NO", then may proceed with Cephalosporin use.      Medication List    TAKE these medications   atorvastatin 20 MG tablet Commonly known as:  LIPITOR TAKE 1 TABLET BY MOUTH EVERY DAY   CEREFOLIN PO Take 1 tablet by mouth daily.   donepezil 10 MG tablet Commonly known as:  ARICEPT TAKE 1 TABLET (10 MG TOTAL) BY MOUTH AT BEDTIME.   ibuprofen 200 MG tablet Commonly known as:  ADVIL,MOTRIN Take 2 tablets (400 mg total) by mouth daily as needed for moderate pain.   ipratropium-albuterol 0.5-2.5 (3) MG/3ML Soln Commonly known as:  DUONEB Take 3 mLs by nebulization 2 (two) times daily. Dx: J44.9 What changed:    when to take this  reasons to take this  additional instructions   levothyroxine 112 MCG tablet Commonly known as:  SYNTHROID, LEVOTHROID Take 137 mcg by mouth daily.   memantine 10 MG tablet Commonly known as:  NAMENDA Take 10 mg by mouth 2 (two) times daily.   metoprolol tartrate 25 MG tablet Commonly known as:  LOPRESSOR Take 0.5 tablets (12.5 mg total) by mouth 2 (two) times daily.   mometasone-formoterol 100-5 MCG/ACT Aero Commonly known as:   DULERA INHALE 2 PUFFS INTO THE LUNGS 2 TIMES DAILY. RINSE MOUTH AFTER EACH USE   multivitamin with minerals Tabs tablet Take 1 tablet by mouth daily.   oxyCODONE 5 MG immediate release tablet Commonly known as:  Oxy IR/ROXICODONE Take 1-3 tablets (5-15 mg total) by mouth every 4 (four) hours as needed.   pantoprazole 40 MG tablet Commonly known as:  PROTONIX Take 1 tablet (40 mg total) by mouth daily at 6 (six) AM. Start taking on:  04/23/2017   PROAIR HFA 108 (90 Base) MCG/ACT inhaler Generic drug:  albuterol Inhale 2 puffs into the lungs every 6 (six) hours as needed. What changed:  reasons to take this   Rivaroxaban 15 MG Tabs tablet Commonly known as:  XARELTO Take 1 tablet (15 mg total) by mouth 2 (two) times daily with a meal. What changed:    when to take this  Another medication with the same name was removed. Continue taking this medication, and follow the directions you see here.   saccharomyces boulardii 250 MG capsule Commonly known as:  FLORASTOR Take 1 capsule (250 mg total) by mouth 2 (two) times daily.   sertraline 50 MG tablet Commonly known as:  ZOLOFT Take 50 mg by mouth at bedtime.   thiamine 100 MG tablet Take 1 tablet (100 mg total) by mouth daily.            Discharge Care Instructions  (From admission, onward)  Start     Ordered   04/20/17 0000  Weight bearing as tolerated     04/20/17 1548      Vitals:   04/22/17 0824 04/22/17 1300  BP:  (!) 102/47  Pulse:  94  Resp:    Temp:  97.8 F (36.6 C)  SpO2: 92% 90%    Skin clean, dry and intact without evidence of skin break down, no evidence of skin tears noted. Mepilex dressings are applied to right hip area (3 different bandages). Clean, dry, and intact, old drainage marked. IV catheter discontinued intact. Site without signs and symptoms of complications. Dressing and pressure applied. Pt denies pain at this time. No complaints noted.  An After Visit Summary and  prescriptions printed and given to the PTAR. Patient escorted via bed per PTAR to SNF. Family member present  [name]

## 2017-04-22 NOTE — Clinical Social Work Note (Signed)
Clinical Social Work Assessment  Patient Details  Name: Sarah Johnson MRN: [id number] Date of Birth: [birth year]  Date of referral:  04/22/17               Reason for consult:  Facility Placement                Permission sought to share information with:  Chartered certified accountant granted to share information::  Yes, Verbal Permission Granted  Name::     Berniece Pap  Agency::  SNF  Relationship::     Contact Information:     Housing/Transportation Living arrangements for the past 2 months:  [place] of Information:  Adult Children Patient Interpreter Needed:  None Criminal Activity/Legal Involvement Pertinent to Current Situation/Hospitalization:  No - Comment as needed Significant Relationships:  Adult Children, Other Family Members Lives with:  Self Do you feel safe going back to the place where you live?  No Need for family participation in patient care:  Yes (Comment)  Care giving concerns:  Pt resides at home alone and ADL's were managed by adult children. Given new impairment, patient not safe to return home alone and will need SNf placement. [name], daughter at bedside in agreement. She indicated that they prefer [facility]. CSW explained SNF process and CSW will f/u.   Social Worker assessment / plan:  CSW will f/u with SNF offers, discuss with family and transport once patient is ready. CSW will complete disposition.  Employment status:  Retired Forensic scientist:  Medicare PT Recommendations:  [place] / Referral to community resources:  [place]  Patient/Family's Response to care:  Patient daughter thanked CSW for f/u to discuss SNF options.  Patient/Family's Understanding of and Emotional Response to Diagnosis, Current Treatment, and Prognosis:  Patient family understands the patient's injury and lack of support at home. Family has been discussing the long term plan for mom since their  father passed away. They understand that short term rehab is the best options given her new injury. CSW validated. No issues or concerns identified.  Emotional Assessment Appearance:  Appears stated age Attitude/Demeanor/Rapport:  Other(Resting restlessly) Affect (typically observed):  Restless Orientation:  Oriented to Self Alcohol / Substance use:  Not Applicable Psych involvement (Current and /or in the community):  No (Comment)  Discharge Needs  Concerns to be addressed:  Discharge Planning Concerns Readmission within the last 30 days:  No Current discharge risk:  Physical Impairment, Dependent with Mobility Barriers to Discharge:  No Barriers Identified   Sarah Baxter, LCSW 04/22/2017, 12:56 PM

## 2017-04-22 NOTE — Clinical Social Work Placement (Signed)
   CLINICAL SOCIAL WORK PLACEMENT  NOTE  Date:  04/22/2017  Patient Details  Name: Sarah Johnson MRN: [id number] Date of Birth: [birth year]  Clinical Social Work is seeking post-discharge placement for this patient at the [place] level of care (*CSW will initial, date and re-position this form in  chart as items are completed):  Yes   Patient/family provided with [place] Work Department's list of facilities offering this level of care within the geographic area requested by the patient (or if unable, by the patient's family).  Yes   Patient/family informed of their freedom to choose among providers that offer the needed level of care, that participate in Medicare, Medicaid or managed care program needed by the patient, have an available bed and are willing to accept the patient.  Yes   Patient/family informed of [facility]'s ownership interest in [facility] and [facility], as well as of the fact that they are under no obligation to receive care at these facilities.  PASRR submitted to EDS on       PASRR number received on       Existing PASRR number confirmed on 04/22/17     FL2 transmitted to all facilities in geographic area requested by pt/family on       FL2 transmitted to all facilities within larger geographic area on 04/22/17     Patient informed that his/her managed care company has contracts with or will negotiate with certain facilities, including the following:        Yes   Patient/family informed of bed offers received.  Patient chooses bed at [facility], [place]     Physician recommends and patient chooses bed at      Patient to be transferred to [facility], [place] on  .  Patient to be transferred to facility by [facility]     Patient family notified on   of transfer.  Name of family member notified:  daughter [name] at bedside     PHYSICIAN       Additional Comment:     _______________________________________________ Normajean Baxter, LCSW 04/22/2017, 2:20 PM

## 2017-04-22 NOTE — NC FL2 (Signed)
[place] LEVEL OF CARE SCREENING TOOL     IDENTIFICATION  Patient Name: Sarah Johnson Birthdate: February 18, 1933 Sex: female Admission Date (Current Location): 04/17/2017  [facility] [place] Number:  Herbalist and Address:  [facility], [place] [address] [place], [place] [address]      Provider Number: [id number]  Attending Physician Name and Address:  Donne Hazel, MD  Relative Name and Phone Number:  Berniece Pap, daughter, [phone]    Current Level of Care: Hospital Recommended Level of Care: [place] Prior Approval Number:    Date Approved/Denied:   PASRR Number: [id number]  Discharge Plan: SNF    Current Diagnoses: Patient Active Problem List   Diagnosis Date Noted  . Closed displaced intertrochanteric fracture of right femur ([place]) 04/18/2017  . Preoperative evaluation of a medical condition to rule out surgical contraindications (TAR required)   . Cellulitis 11/28/2015  . Cellulitis of right hand 11/28/2015  . Hypothyroidism 11/28/2015  . COPD (chronic obstructive pulmonary disease) ([place]) 09/25/2015  . Leukocytosis 02/11/2015  . Hypokalemia 02/11/2015  . Hypomagnesemia 02/11/2015  . Deep vein thrombosis (DVT) of right lower extremity ([place]) 02/08/2015  . CKD (chronic kidney disease), stage II 02/05/2015  . Anemia of chronic renal failure, stage 2 (mild) 02/05/2015  . Thrombocytopenia ([place]) 02/05/2015  . Left lower lobe pneumonia ([place]) 02/05/2015  . Sepsis ([place])   . History of Clostridium difficile colitis 05/09/2014  . History of aortic valve replacement 11/05/2010  . HTN (hypertension) 11/04/2009    Orientation RESPIRATION BLADDER Height & Weight     Self, Place  O2(Nasal Cannula 3L) Continent Weight: 183 lb 6.8 oz (83.2 kg) Height:  5' (152.4 cm)  BEHAVIORAL SYMPTOMS/MOOD NEUROLOGICAL BOWEL NUTRITION STATUS      Continent Diet(See DC Summary)  AMBULATORY STATUS COMMUNICATION OF  NEEDS Skin   Extensive Assist Verbally Surgical wounds                       Personal Care Assistance Level of Assistance  Dressing, Bathing, Feeding Bathing Assistance: Maximum assistance Feeding assistance: Limited assistance Dressing Assistance: Maximum assistance     Functional Limitations Info  Sight, Hearing, Speech Sight Info: Adequate Hearing Info: Adequate Speech Info: Adequate    SPECIAL CARE FACTORS FREQUENCY  OT (By licensed OT), PT (By licensed PT)     PT Frequency: 5x week OT Frequency: 5x week            Contractures Contractures Info: Not present    Additional Factors Info  Code Status, Allergies, Psychotropic Code Status Info: DNR Allergies Info: CODEINE, PENICILLINS  Psychotropic Info: Namenda and Zoloft         Current Medications (04/22/2017):  This is the current hospital active medication list Current Facility-Administered Medications  Medication Dose Route Frequency Provider Last Rate Last Dose  . 0.9 %  sodium chloride infusion   Intravenous Continuous Leandrew Koyanagi, MD 125 mL/hr at 04/21/17 1501    . acetaminophen (TYLENOL) tablet 650 mg  650 mg Oral Q6H PRN Leandrew Koyanagi, MD       Or  . acetaminophen (TYLENOL) suppository 650 mg  650 mg Rectal Q6H PRN Leandrew Koyanagi, MD      . acetaminophen (TYLENOL) tablet 1,000 mg  1,000 mg Oral TID Vilma Prader, MD   1,000 mg at 04/21/17 2258  . alum & mag hydroxide-simeth (MAALOX/MYLANTA) 200-200-20 MG/5ML suspension 30 mL  30 mL  Oral Q4H PRN Leandrew Koyanagi, MD      . atorvastatin (LIPITOR) tablet 20 mg  20 mg Oral Daily Vilma Prader, MD   20 mg at 04/21/17 0935  . budesonide (PULMICORT) nebulizer solution 0.5 mg  0.5 mg Nebulization BID Eugenie Filler, MD   0.5 mg at 04/22/17 4332  . donepezil (ARICEPT) tablet 10 mg  10 mg Oral QHS Eugenie Filler, MD   10 mg at 04/21/17 2258  . HYDROcodone-acetaminophen (NORCO/VICODIN) 5-325 MG per tablet 1-2 tablet  1-2 tablet Oral Q6H PRN Leandrew Koyanagi, MD   2 tablet at 04/22/17 [phone]  . ipratropium-albuterol (DUONEB) 0.5-2.5 (3) MG/3ML nebulizer solution 3 mL  3 mL Nebulization Q4H PRN Vilma Prader, MD      . ipratropium-albuterol (DUONEB) 0.5-2.5 (3) MG/3ML nebulizer solution 3 mL  3 mL Nebulization BID Eugenie Filler, MD   3 mL at 04/22/17 0824  . lactated ringers infusion   Intravenous Continuous Lillia Abed, MD 10 mL/hr at 04/20/17 1404    . levothyroxine (SYNTHROID, LEVOTHROID) tablet 137 mcg  137 mcg Oral QAC breakfast Vilma Prader, MD   137 mcg at 04/22/17 [phone]  . loratadine (CLARITIN) tablet 10 mg  10 mg Oral Daily Eugenie Filler, MD   10 mg at 04/21/17 0935  . memantine (NAMENDA) tablet 10 mg  10 mg Oral BID Eugenie Filler, MD   10 mg at 04/21/17 2258  . menthol-cetylpyridinium (CEPACOL) lozenge 3 mg  1 lozenge Oral PRN Leandrew Koyanagi, MD       Or  . phenol (CHLORASEPTIC) mouth spray 1 spray  1 spray Mouth/Throat PRN Leandrew Koyanagi, MD      . methocarbamol (ROBAXIN) tablet 500 mg  500 mg Oral Q6H PRN Leandrew Koyanagi, MD   500 mg at 04/22/17 6063   Or  . methocarbamol (ROBAXIN) 500 mg in dextrose 5 % 50 mL IVPB  500 mg Intravenous Q6H PRN Leandrew Koyanagi, MD      . metoCLOPramide (REGLAN) tablet 5-10 mg  5-10 mg Oral Q8H PRN Leandrew Koyanagi, MD       Or  . metoCLOPramide (REGLAN) injection 5-10 mg  5-10 mg Intravenous Q8H PRN Leandrew Koyanagi, MD      . metoprolol tartrate (LOPRESSOR) tablet 12.5 mg  12.5 mg Oral BID Vilma Prader, MD   12.5 mg at 04/21/17 2258  . morphine 2 MG/ML injection 0.5 mg  0.5 mg Intravenous Q2H PRN Leandrew Koyanagi, MD      . multivitamin with minerals tablet 1 tablet  1 tablet Oral Daily Vilma Prader, MD   1 tablet at 04/21/17 0935  . ondansetron (ZOFRAN) tablet 4 mg  4 mg Oral Q6H PRN Leandrew Koyanagi, MD       Or  . ondansetron [facility]) injection 4 mg  4 mg Intravenous Q6H PRN Leandrew Koyanagi, MD      . oxyCODONE (Oxy IR/ROXICODONE) immediate release tablet 5-10 mg  5-10 mg Oral  Q4H PRN Leandrew Koyanagi, MD   10 mg at 04/21/17 0608  . pantoprazole (PROTONIX) EC tablet 40 mg  40 mg Oral Q0600 Eugenie Filler, MD   40 mg at 04/22/17 0600  . rivaroxaban (XARELTO) tablet 10 mg  10 mg Oral QAC supper Alvira Philips, [place]   10 mg at 04/21/17 1649  . senna (SENOKOT) tablet 8.6 mg  1 tablet Oral Daily Vilma Prader,  MD   8.6 mg at 04/21/17 0936  . sertraline (ZOLOFT) tablet 50 mg  50 mg Oral QHS Vilma Prader, MD   50 mg at 04/21/17 2258  . sodium chloride 0.9 % bolus 250 mL  250 mL Intravenous Once Eugenie Filler, MD      . sodium chloride flush (NS) 0.9 % injection 3 mL  3 mL Intravenous Q12H Vilma Prader, MD   3 mL at 04/21/17 2258  . thiamine (VITAMIN B-1) tablet 100 mg  100 mg Oral Daily Vilma Prader, MD   100 mg at 04/21/17 9747     Discharge Medications: Please see discharge summary for a list of discharge medications.  Relevant Imaging Results:  Relevant Lab Results:   Additional Information SS#: [place], LCSW

## 2017-04-22 NOTE — Social Work (Signed)
Clinical Social Worker facilitated patient discharge including contacting patient family and facility to confirm patient discharge plans.  Clinical information faxed to facility and family agreeable with plan.    CSW arranged ambulance transport via [facility] to [facility].    RN to call [phone] to give report prior to discharge. Pt going to Room 104b.  Clinical Social Worker will sign off for now as social work intervention is no longer needed. Please consult [place] again if new need arises.  Elissa Hefty, LCSW Clinical Social Worker [phone]

## 2017-04-24 ENCOUNTER — Encounter (HOSPITAL_COMMUNITY): Payer: Self-pay

## 2017-04-24 ENCOUNTER — Emergency Department (HOSPITAL_COMMUNITY): Payer: Medicare Other

## 2017-04-24 ENCOUNTER — Other Ambulatory Visit: Payer: Self-pay

## 2017-04-24 ENCOUNTER — Emergency Department (HOSPITAL_COMMUNITY)
Admission: EM | Admit: 2017-04-24 | Discharge: 2017-04-24 | Disposition: A | Discharge status: Home / Self Care | Payer: Medicare Other | Attending: Emergency Medicine | Admitting: Emergency Medicine

## 2017-04-24 DIAGNOSIS — N189 Chronic kidney disease, unspecified: Secondary | ICD-10-CM | POA: Diagnosis not present

## 2017-04-24 DIAGNOSIS — Z79899 Other long term (current) drug therapy: Secondary | ICD-10-CM | POA: Diagnosis not present

## 2017-04-24 DIAGNOSIS — R0602 Shortness of breath: Secondary | ICD-10-CM | POA: Diagnosis not present

## 2017-04-24 DIAGNOSIS — R918 Other nonspecific abnormal finding of lung field: Secondary | ICD-10-CM | POA: Diagnosis not present

## 2017-04-24 DIAGNOSIS — J449 Chronic obstructive pulmonary disease, unspecified: Secondary | ICD-10-CM | POA: Diagnosis not present

## 2017-04-24 DIAGNOSIS — F0390 Unspecified dementia without behavioral disturbance: Secondary | ICD-10-CM | POA: Insufficient documentation

## 2017-04-24 DIAGNOSIS — Z87891 Personal history of nicotine dependence: Secondary | ICD-10-CM | POA: Insufficient documentation

## 2017-04-24 DIAGNOSIS — Z86711 Personal history of pulmonary embolism: Secondary | ICD-10-CM | POA: Insufficient documentation

## 2017-04-24 DIAGNOSIS — I2699 Other pulmonary embolism without acute cor pulmonale: Secondary | ICD-10-CM | POA: Diagnosis not present

## 2017-04-24 DIAGNOSIS — Z86718 Personal history of other venous thrombosis and embolism: Secondary | ICD-10-CM | POA: Insufficient documentation

## 2017-04-24 LAB — CBC WITH DIFFERENTIAL/PLATELET
Basophils Absolute: 0 K/uL (ref 0.0–0.1)
Basophils Relative: 0 %
Eosinophils Absolute: 0.2 K/uL (ref 0.0–0.7)
Eosinophils Relative: 1 %
HCT: 27.9 % — ABNORMAL LOW (ref 36.0–46.0)
Hemoglobin: 8.9 g/dL — ABNORMAL LOW (ref 12.0–15.0)
Lymphocytes Relative: 19 %
Lymphs Abs: 3.2 K/uL (ref 0.7–4.0)
MCH: 29.8 pg (ref 26.0–34.0)
MCHC: 31.9 g/dL (ref 30.0–36.0)
MCV: 93.3 fL (ref 78.0–100.0)
Monocytes Absolute: 1.8 K/uL — ABNORMAL HIGH (ref 0.1–1.0)
Monocytes Relative: 11 %
Neutro Abs: 11.6 K/uL — ABNORMAL HIGH (ref 1.7–7.7)
Neutrophils Relative %: 69 %
Platelets: ADEQUATE K/uL (ref 150–400)
RBC: 2.99 MIL/uL — ABNORMAL LOW (ref 3.87–5.11)
RDW: 17 % — ABNORMAL HIGH (ref 11.5–15.5)
WBC: 16.8 K/uL — ABNORMAL HIGH (ref 4.0–10.5)

## 2017-04-24 LAB — BASIC METABOLIC PANEL
Anion gap: 7 (ref 5–15)
BUN: 23 mg/dL — ABNORMAL HIGH (ref 6–20)
CALCIUM: 9 mg/dL (ref 8.9–10.3)
CO2: 29 mmol/L (ref 22–32)
CREATININE: 1.12 mg/dL — AB (ref 0.44–1.00)
Chloride: 105 mmol/L (ref 101–111)
GFR calc non Af Amer: 44 mL/min — ABNORMAL LOW (ref >60)
GFR, EST AFRICAN AMERICAN: 52 mL/min — AB (ref >60)
GLUCOSE: 98 mg/dL (ref 65–99)
Potassium: 3.8 mmol/L (ref 3.5–5.1)
Sodium: 141 mmol/L (ref 135–145)

## 2017-04-24 LAB — I-STAT CHEM 8, ED
BUN: 21 mg/dL — ABNORMAL HIGH (ref 6–20)
Calcium, Ion: 1.24 mmol/L (ref 1.15–1.40)
Chloride: 101 mmol/L (ref 101–111)
Creatinine, Ser: 1.1 mg/dL — ABNORMAL HIGH (ref 0.44–1.00)
Glucose, Bld: 99 mg/dL (ref 65–99)
HCT: 28 % — ABNORMAL LOW (ref 36.0–46.0)
Hemoglobin: 9.5 g/dL — ABNORMAL LOW (ref 12.0–15.0)
Potassium: 3.8 mmol/L (ref 3.5–5.1)
Sodium: 143 mmol/L (ref 135–145)
TCO2: 29 mmol/L (ref 22–32)

## 2017-04-24 LAB — I-STAT TROPONIN, ED
TROPONIN I, POC: 0.08 ng/mL (ref 0.00–0.08)
Troponin i, poc: 0.08 ng/mL (ref 0.00–0.08)

## 2017-04-24 MED ORDER — IOPAMIDOL (ISOVUE-370) INJECTION 76%
INTRAVENOUS | Status: AC
  Filled 2017-04-24: qty 100

## 2017-04-24 MED ORDER — IOPAMIDOL (ISOVUE-370) INJECTION 76%
100.0000 mL | Freq: Once | INTRAVENOUS | Status: AC | PRN
  Administered 2017-04-24: 80 mL via INTRAVENOUS

## 2017-04-24 NOTE — ED Triage Notes (Signed)
Patient coming from [facility] with c/o shortness of breath. Patient there for rehab after hip surgery last week at [facility]. Pt was given breathing treatment by nursing home staff with no relief. Pt have hx of COPD. Pt is currently on 4 L [place] but not on oxygen normally.

## 2017-04-24 NOTE — Discharge Instructions (Signed)
It was my pleasure taking care of you today!   Fortunately your work up today was very reassuring.   Please follow up with your primary care doctor.   Return to ER for new or worsening symptoms, any additional concerns.

## 2017-04-24 NOTE — ED Provider Notes (Signed)
[facility] DEPT Provider Note   CSN: [id number] Arrival date & time: 04/24/17  1020     History   Chief Complaint No chief complaint on file.   HPI Sarah Johnson is a 83 y.o. female.  The history is provided by the patient, the nursing home and medical records. The history is limited by the condition of the patient. No language interpreter was used.   Sarah Johnson is a 83 y.o. female  with a PMH of CKD, prior PE, HTN, COPD, recent right intertrochanteric hip fracture s/p intramedullary implant on 12/26 who presents to the Emergency Department from facility for hypoxia.  History is limited due to patient condition/dementia.  Patient states that she is unsure why she is in the hospital and she feels in her usual state of health.  Per staff at facility, patient was noted to be hypoxic at 71% this morning vital check.  She was requiring 2-3 L oxygen to maintain saturations, therefore sent to ER for further evaluation.  Patient denies any difficulty breathing or chest pain.  Denies lower extremity pain.  Per MAR from facility, she has been on 15 mg Xarelto BID as directed.   I spoke with nurse at facility who states that she has been on 2L O2 while at facility.  This morning, patient's oxygen saturation was 71-78% on 2 L which was why she was transferred to the emergency department.  She was not having any increased effort in breathing and was not complaining of any shortness of breath.  This was noticed on routine vital check in the morning.  Level V caveat applies 2/2 dementia.  Past Medical History:  Diagnosis Date  . Arthritis   . Blind right eye   . Cataract    RT  . CKD (chronic kidney disease)   . COPD (chronic obstructive pulmonary disease) ([place])   . Emphysema    followed by Dr. Joya Gaskins  . GERD (gastroesophageal reflux disease)   . History of Doppler echocardiogram    carotid dopplers. (8/11) no significant stenosis  . HTN (hypertension)    . Hx of echocardiogram 2015   Echo (08/2013):  EF 60-65%, no RWMA, Gr 1 DD, AVR ok (mean 5 mmHg), mild MR, PASP 22 mmHg  . Hyperlipidemia   . Hypothyroidism   . Pulmonary embolism ([place]) 2010   She is no onger taking coumadin  . S/P aortic valve replacement with bioprosthetic valve    ehco (8/11) with EF 55-60%, mild-mod MR, mild-mod TR, poorly visualized bioprosthetic aortic valve but no significant regurgitation and gradient not significanly elevated.    . S/P thoracic aortic aneurysm repair 1/10   also with bioprosthetic aortic valve prelacement [facility]). in 10/10 she has a descending thoraic aorta anuerysm and abdominal reapir [facility]). Vascular urgeon is Dr. Lunette Stands.   Marland Kitchen SVT (supraventricular tachycardia) ([place])    HOLTER MONITOR AFTER SURGERY     Patient Active Problem List   Diagnosis Date Noted  . Closed displaced intertrochanteric fracture of right femur ([place]) 04/18/2017  . Preoperative evaluation of a medical condition to rule out surgical contraindications (TAR required)   . Cellulitis 11/28/2015  . Cellulitis of right hand 11/28/2015  . Hypothyroidism 11/28/2015  . COPD (chronic obstructive pulmonary disease) ([place]) 09/25/2015  . Leukocytosis 02/11/2015  . Hypokalemia 02/11/2015  . Hypomagnesemia 02/11/2015  . Deep vein thrombosis (DVT) of right lower extremity ([place]) 02/08/2015  . CKD (chronic kidney disease), stage II 02/05/2015  . Anemia of  chronic renal failure, stage 2 (mild) 02/05/2015  . Thrombocytopenia ([place]) 02/05/2015  . Left lower lobe pneumonia ([place]) 02/05/2015  . Sepsis ([place])   . History of Clostridium difficile colitis 05/09/2014  . History of aortic valve replacement 11/05/2010  . HTN (hypertension) 11/04/2009    Past Surgical History:  Procedure Laterality Date  . ANOMALOUS PULMONARY VENOUS RETURN REPAIR    . BACK SURGERY  1970   . EYE SURGERY     AS BABY- RT EYE  POOR SIGHT  . INTRAMEDULLARY (IM) NAIL INTERTROCHANTERIC Right 04/20/2017    Procedure: INTRAMEDULLARY (IM) NAIL INTERTROCHANTRIC;  Surgeon: Leandrew Koyanagi, MD;  Location: [facility];  Service: Orthopedics;  Laterality: Right;  . LUMBAR LAMINECTOMY/DECOMPRESSION MICRODISCECTOMY  05/31/2011   Procedure: LUMBAR LAMINECTOMY/DECOMPRESSION MICRODISCECTOMY;  Surgeon: Elaina Hoops, MD;  Location: [facility]UR[facility];  Service: Neurosurgery;  Laterality: Bilateral;  Bilateral Lumbar three-four decompressionj lumbar laminectomy   . THORACIC AORTIC ANEURYSM REPAIR  1/10   VALVE ALSO DONE  . THORACOABDOMINAL AORTIC ANEURYSM REPAIR  01/28/09   repair  . TONSILLECTOMY      OB History    No data available       Home Medications    Prior to Admission medications   Medication Sig Start Date End Date Taking? Authorizing Provider  atorvastatin (LIPITOR) 20 MG tablet TAKE 1 TABLET BY MOUTH EVERY DAY 09/20/14   Larey Dresser, MD  donepezil (ARICEPT) 10 MG tablet TAKE 1 TABLET (10 MG TOTAL) BY MOUTH AT BEDTIME. 10/19/14   Star Age, MD  ibuprofen (ADVIL,MOTRIN) 200 MG tablet Take 2 tablets (400 mg total) by mouth daily as needed for moderate pain. 02/12/15   Florencia Reasons, MD  ipratropium-albuterol (DUONEB) 0.5-2.5 (3) MG/3ML SOLN Take 3 mLs by nebulization 2 (two) times daily. Dx: J44.9 Patient taking differently: Take 3 mLs by nebulization every 4 (four) hours as needed (SOB). Dx: J44.9 08/13/16   Parrett, Fonnie Mu, NP  L-Methylfolate-B12-B6-B2 (CEREFOLIN PO) Take 1 tablet by mouth daily.    [provider]  levothyroxine (SYNTHROID, LEVOTHROID) 112 MCG tablet Take 137 mcg by mouth daily.  12/24/14   [provider]  memantine (NAMENDA) 10 MG tablet Take 10 mg by mouth 2 (two) times daily.  09/01/15   [provider]  metoprolol tartrate (LOPRESSOR) 25 MG tablet Take 0.5 tablets (12.5 mg total) by mouth 2 (two) times daily. 02/12/15   Florencia Reasons, MD  mometasone-formoterol (DULERA) 100-5 MCG/ACT AERO INHALE 2 PUFFS INTO THE LUNGS 2 TIMES DAILY. RINSE MOUTH AFTER EACH USE 09/17/15    Parrett, Fonnie Mu, NP  Multiple Vitamin (MULITIVITAMIN WITH MINERALS) TABS Take 1 tablet by mouth daily.    [provider]  oxyCODONE (OXY IR/ROXICODONE) 5 MG immediate release tablet Take 1-3 tablets (5-15 mg total) by mouth every 4 (four) hours as needed. 04/20/17   Leandrew Koyanagi, MD  pantoprazole (PROTONIX) 40 MG tablet Take 1 tablet (40 mg total) by mouth daily at 6 (six) AM. 04/23/17   Donne Hazel, MD  PROAIR HFA 108 (90 BASE) MCG/ACT inhaler Inhale 2 puffs into the lungs every 6 (six) hours as needed. Patient taking differently: Inhale 2 puffs into the lungs every 6 (six) hours as needed for wheezing or shortness of breath.  05/08/14   Elsie Stain, MD  Rivaroxaban (XARELTO) 15 MG TABS tablet Take 1 tablet (15 mg total) by mouth 2 (two) times daily with a meal. 04/20/17 10/19/19  Leandrew Koyanagi, MD  saccharomyces boulardii (  FLORASTOR) 250 MG capsule Take 1 capsule (250 mg total) by mouth 2 (two) times daily. 03/29/14   Robbie Lis, MD  sertraline (ZOLOFT) 50 MG tablet Take 50 mg by mouth at bedtime.  07/19/13   [provider]  thiamine 100 MG tablet Take 1 tablet (100 mg total) by mouth daily. 02/12/15   Florencia Reasons, MD    Family History Family History  Problem Relation Age of Onset  . Brain cancer Maternal Grandmother   . Coronary artery disease Unknown        no premature CAD    Social History Social History   Tobacco Use  . Smoking status: Former Smoker    Packs/day: 1.00    Years: 20.00    Pack years: 20.00    Types: Cigarettes    Last attempt to quit: 04/26/2004    Years since quitting: 13.0  . Smokeless tobacco: Never Used  . Tobacco comment: quit in 2006  Substance Use Topics  . Alcohol use: No  . Drug use: No     Allergies   Codeine and Penicillins   Review of Systems Review of Systems  Unable to perform ROS: Dementia     Physical Exam Updated Vital Signs BP 133/69 (BP Location: Left Arm)   Pulse 94   Temp 98.5 F (36.9 C)  (Oral)   Resp 16   SpO2 96%   Physical Exam  Constitutional: She is oriented to person, place, and time. She appears well-developed and well-nourished. No distress.  Nontoxic-appearing, resting comfortably in the room.  HENT:  Head: Normocephalic and atraumatic.  Neck: Neck supple.  Cardiovascular: Normal rate, regular rhythm and normal heart sounds.  No murmur heard. Pulmonary/Chest: Effort normal and breath sounds normal. No respiratory distress. She has no wheezes. She has no rales. She exhibits no tenderness.  Abdominal: Soft. She exhibits no distension. There is no tenderness.  Musculoskeletal: She exhibits no edema.  Neurological: She is alert and oriented to person, place, and time.  Skin: Skin is warm and dry.  Nursing note and vitals reviewed.    ED Treatments / Results  Labs (all labs ordered are listed, but only abnormal results are displayed) Labs Reviewed  BASIC METABOLIC PANEL - Abnormal; Notable for the following components:      Result Value   BUN 23 (*)    Creatinine, Ser 1.12 (*)    GFR calc non Af Amer 44 (*)    GFR calc Af Amer 52 (*)    All other components within normal limits  CBC WITH DIFFERENTIAL/PLATELET - Abnormal; Notable for the following components:   WBC 16.8 (*)    RBC 2.99 (*)    Hemoglobin 8.9 (*)    HCT 27.9 (*)    RDW 17.0 (*)    Neutro Abs 11.6 (*)    Monocytes Absolute 1.8 (*)    All other components within normal limits  I-STAT CHEM 8, ED - Abnormal; Notable for the following components:   BUN 21 (*)    Creatinine, Ser 1.10 (*)    Hemoglobin 9.5 (*)    HCT 28.0 (*)    All other components within normal limits  I-STAT TROPONIN, ED  I-STAT TROPONIN, ED    EKG  EKG Interpretation  Date/Time:  Sunday April 24 2017 11:07:22 EST Ventricular Rate:  94 PR Interval:    QRS Duration: 80 QT Interval:  335 QTC Calculation: 419 R Axis:   66 Text Interpretation:  Sinus rhythm RSR' in V1  or V2, probably normal variant Confirmed  by Virgel Manifold [phone]) on 04/24/2017 11:17:02 AM Also confirmed by Virgel Manifold [phone]), editor Philomena Doheny [phone])  on 04/24/2017 11:30:22 AM       Radiology Dg Chest 2 View  Result Date: 04/24/2017 CLINICAL DATA:  83 year old female with a history of shortness of breath EXAM: CHEST  2 VIEW COMPARISON:  04/17/2017 FINDINGS: Cardiomediastinal silhouette unchanged in size and contour with cardiomegaly. Surgical changes of prior median sternotomy. Similar appearance of interlobular septal thickening with patchy airspace opacity at the left lung base. No large pleural effusion. No displaced fracture. Surgical changes of prior thoracic kyphoplasty IMPRESSION: Similar appearance of interstitial opacities with patchy airspace opacity at the left lung base, potentially developing infection and/or edema. Surgical changes of median sternotomy Electronically Signed   By: Corrie Mckusick D.O.   On: 04/24/2017 11:04   Ct Angio Chest Pe W And/or Wo Contrast  Result Date: 04/24/2017 CLINICAL DATA:  83 year old female with a history of shortness of breath. Postop. Concern for pulmonary embolism EXAM: CT ANGIOGRAPHY CHEST WITH CONTRAST TECHNIQUE: Multidetector CT imaging of the chest was performed using the standard protocol during bolus administration of intravenous contrast. Multiplanar CT image reconstructions and MIPs were obtained to evaluate the vascular anatomy. CONTRAST:  65mL ISOVUE-370 IOPAMIDOL (ISOVUE-370) INJECTION 76% COMPARISON:  Chest CT 05/30/2012 FINDINGS: Cardiovascular: Heart: Cardiomegaly. No pericardial fluid/ thickening. Calcifications of the native coronary vessels including left main, left anterior descending, circumflex, right coronary artery. Surgical changes of aortic valve replacement. Aorta: Surgical changes of prior median sternotomy with apparent total arch repair, likely for prior dissection. No comparison available. There has been valve replacement, grafting of the tubular aorta  with the branching of the aortic arch and complete arch reconstruction. Graft for the branch vessels is patent with patency maintained of the proximal carotid arteries and left vertebral artery. The right vertebral artery not visualized. Bilateral subclavian artery remain patent. Greatest diameter of the aortic arch near the surgical anastomosis 3.8 cm. Descending thoracic aorta with the greatest diameter just above the aortic hiatus measures 4.1 cm. Mild circumferential intimal thickening/hyperplasia without stenosis. No periaortic fluid. Pulmonary arteries: No filling defect of the main pulmonary artery, lobar or proximal segmental pulmonary arteries. Beyond the segmental pulmonary arteries, study is limited by the contrast bolus and respiratory motion. Mediastinum/Nodes: Surgical changes of prior median sternotomy and aortic repair. Small lymph nodes within the mediastinum. Unremarkable thoracic inlet. Lungs/Pleura: No endotracheal or endobronchial debris. Trace pleural effusions. Mild interlobular septal thickening and thickening of the fissures. No confluent airspace disease. No bronchial wall thickening. Upper Abdomen: Unremarkable. Musculoskeletal: Surgical changes of kyphoplasty of the midthoracic vertebral body. No acute fracture line identified. Accentuated kyphotic curvature of the thoracic spine. No bony canal narrowing. Subacute/ healing rib fractures on the right, including 3, 4, 5 Review of the MIP images confirms the above findings. IMPRESSION: Study is negative for pulmonary emboli. Questionable early pulmonary edema with trace bilateral pleural effusions. No evidence of lobar pneumonia. Surgical changes of prior median sternotomy, aortic valve replacement, ascending and complete arch reconstruction with debranching. No complicating features. Native coronary calcifications including left main and 3 vessel coronary artery disease. Electronically Signed   By: Corrie Mckusick D.O.   On: 04/24/2017 12:16      Procedures Procedures (including critical care time)  Medications Ordered in ED Medications  iopamidol (ISOVUE-370) 76 % injection (not administered)  iopamidol (ISOVUE-370) 76 % injection 100 mL (80 mLs Intravenous Contrast Given 04/24/17 1138)  Initial Impression / Assessment and Plan / ED Course  I have reviewed the triage vital signs and the nursing notes.  Pertinent labs & imaging results that were available during my care of the patient were reviewed by me and considered in my medical decision making (see chart for details).    Sarah Johnson is a 83 y.o. female who presents to ED from [facility] for hypoxia. I spoke with nurse at facility who states that patient has been on 3L o2 with oxygen in low 90's while she has been there. This morning, oxygen was 71-78% on 3L. Given acute change, she was sent to ER for evaluation. Work up here very reassuring. Given recent hip replacement and hypoxia, CT angio was obtained which was negative for PE. No PNA. Questionable early pulmonary edema with trace bilateral pleural effusions. Labs baseline. Troponin negative x 2. EKG reassuring. While in ED today, she has been on 3L O2 (Home baseline oxygen requirement) with no signs of hypoxia. She has no complaints. Daughters at bedside feel she is at her baseline and feel comfortable with discharge back to facility. Evaluation does not show pathology that would require ongoing emergent intervention or inpatient treatment. PCP follow up encouraged. Reasons to return to ER discussed with family at bedside. All questions answered.   Patient discussed with Dr. Wilson Singer who agrees with treatment plan.    Final Clinical Impressions(s) / ED Diagnoses   Final diagnoses:  Shortness of breath    ED Discharge Orders    None       Ward, Ozella Almond, PA-C 04/24/17 1449    Virgel Manifold, MD 04/25/17 [phone]

## 2017-04-24 NOTE — ED Notes (Signed)
Bed: WA20 Expected date:  Expected time:  Means of arrival:  Comments: 83 yo SOB

## 2017-04-24 NOTE — ED Notes (Signed)
Called [facility] for transport back to [facility] in [place]

## 2017-04-26 DIAGNOSIS — J449 Chronic obstructive pulmonary disease, unspecified: Secondary | ICD-10-CM | POA: Diagnosis not present

## 2017-04-26 DIAGNOSIS — G309 Alzheimer's disease, unspecified: Secondary | ICD-10-CM | POA: Diagnosis not present

## 2017-04-26 DIAGNOSIS — I10 Essential (primary) hypertension: Secondary | ICD-10-CM | POA: Diagnosis not present

## 2017-04-26 DIAGNOSIS — S72143D Displaced intertrochanteric fracture of unspecified femur, subsequent encounter for closed fracture with routine healing: Secondary | ICD-10-CM | POA: Diagnosis not present

## 2017-04-26 DIAGNOSIS — R2689 Other abnormalities of gait and mobility: Secondary | ICD-10-CM | POA: Diagnosis not present

## 2017-04-26 DIAGNOSIS — K219 Gastro-esophageal reflux disease without esophagitis: Secondary | ICD-10-CM | POA: Diagnosis not present

## 2017-05-03 ENCOUNTER — Ambulatory Visit (INDEPENDENT_AMBULATORY_CARE_PROVIDER_SITE_OTHER): Payer: No Typology Code available for payment source

## 2017-05-03 ENCOUNTER — Encounter (INDEPENDENT_AMBULATORY_CARE_PROVIDER_SITE_OTHER): Payer: Self-pay | Admitting: Orthopaedic Surgery

## 2017-05-03 ENCOUNTER — Ambulatory Visit (INDEPENDENT_AMBULATORY_CARE_PROVIDER_SITE_OTHER): Payer: No Typology Code available for payment source | Admitting: Orthopaedic Surgery

## 2017-05-03 DIAGNOSIS — S72001D Fracture of unspecified part of neck of right femur, subsequent encounter for closed fracture with routine healing: Secondary | ICD-10-CM

## 2017-05-03 NOTE — Progress Notes (Signed)
Patient ID: Sarah Johnson, female   DOB: 1932/12/01, 84 y.o.   MRN: [id number]  Patient is two-week status post intramedullary fixation of a subtrochanteric femur fracture.  She is at [place].  She is confused but is not complaining of any significant pain.  Her surgical incisions have healed without signs of infection.  Her x-rays show stable fixation and alignment of the fracture.  From my standpoint everything is stable.  Continue with physical therapy for strengthening and mobilization.  Follow-up in 4 weeks with 2 view x-rays of the right hip.

## 2017-06-01 ENCOUNTER — Encounter (INDEPENDENT_AMBULATORY_CARE_PROVIDER_SITE_OTHER): Payer: Self-pay | Admitting: Orthopedic Surgery

## 2017-06-01 ENCOUNTER — Ambulatory Visit (INDEPENDENT_AMBULATORY_CARE_PROVIDER_SITE_OTHER): Payer: No Typology Code available for payment source | Admitting: Orthopedic Surgery

## 2017-06-01 ENCOUNTER — Ambulatory Visit (INDEPENDENT_AMBULATORY_CARE_PROVIDER_SITE_OTHER): Payer: No Typology Code available for payment source

## 2017-06-01 DIAGNOSIS — M25551 Pain in right hip: Secondary | ICD-10-CM | POA: Diagnosis not present

## 2017-06-01 DIAGNOSIS — S72001D Fracture of unspecified part of neck of right femur, subsequent encounter for closed fracture with routine healing: Secondary | ICD-10-CM

## 2017-06-04 NOTE — Progress Notes (Signed)
Post-Op Visit Note   Patient: Sarah Johnson           Date of Birth: [birth year]           MRN: [id number] Visit Date: 06/01/2017 PCP: Jani Gravel, MD   Assessment & Plan:  Chief Complaint:  Chief Complaint  Patient presents with  . Right Leg - Routine Post Op   Visit Diagnoses:  1. Pain in right hip   2. Closed fracture of right hip with routine healing, subsequent encounter     Plan: Patient presents for follow-up of right fracture treated with IM at bedtime.  Is having no problems.  On examination she does have mild pain with range of motion of the hip.  Intake 30 steps with a walker and assist.  Radiographs look good.  Plan is to continue weightbearing as tolerated on that right side.  Follow-up in 4 weeks with Dr. Sherrian Divers for repeat radiographs just to make sure that fracture continues to consolidate.  Follow-Up Instructions: Return in about 4 weeks (around 06/29/2017).   Orders:  Orders Placed This Encounter  Procedures  . XR HIP UNILAT W OR W/O PELVIS 2-3 VIEWS RIGHT   No orders of the defined types were placed in this encounter.   Imaging: No results found.  PMFS History: Patient Active Problem List   Diagnosis Date Noted  . Closed displaced intertrochanteric fracture of right femur ([place]) 04/18/2017  . Preoperative evaluation of a medical condition to rule out surgical contraindications (TAR required)   . Cellulitis 11/28/2015  . Cellulitis of right hand 11/28/2015  . Hypothyroidism 11/28/2015  . COPD (chronic obstructive pulmonary disease) ([place]) 09/25/2015  . Leukocytosis 02/11/2015  . Hypokalemia 02/11/2015  . Hypomagnesemia 02/11/2015  . Deep vein thrombosis (DVT) of right lower extremity ([place]) 02/08/2015  . CKD (chronic kidney disease), stage II 02/05/2015  . Anemia of chronic renal failure, stage 2 (mild) 02/05/2015  . Thrombocytopenia ([place]) 02/05/2015  . Left lower lobe pneumonia ([place]) 02/05/2015  . Sepsis ([place])   . History of Clostridium difficile  colitis 05/09/2014  . History of aortic valve replacement 11/05/2010  . HTN (hypertension) 11/04/2009   Past Medical History:  Diagnosis Date  . Arthritis   . Blind right eye   . Cataract    RT  . CKD (chronic kidney disease)   . COPD (chronic obstructive pulmonary disease) ([place])   . Emphysema    followed by Dr. Joya Gaskins  . GERD (gastroesophageal reflux disease)   . History of Doppler echocardiogram    carotid dopplers. (8/11) no significant stenosis  . HTN (hypertension)   . Hx of echocardiogram 2015   Echo (08/2013):  EF 60-65%, no RWMA, Gr 1 DD, AVR ok (mean 5 mmHg), mild MR, PASP 22 mmHg  . Hyperlipidemia   . Hypothyroidism   . Pulmonary embolism ([place]) 2010   She is no onger taking coumadin  . S/P aortic valve replacement with bioprosthetic valve    ehco (8/11) with EF 55-60%, mild-mod MR, mild-mod TR, poorly visualized bioprosthetic aortic valve but no significant regurgitation and gradient not significanly elevated.    . S/P thoracic aortic aneurysm repair 1/10   also with bioprosthetic aortic valve prelacement [facility]). in 10/10 she has a descending thoraic aorta anuerysm and abdominal reapir [facility]). Vascular urgeon is Dr. Lunette Stands.   Marland Kitchen SVT (supraventricular tachycardia) ([facility])    HOLTER MONITOR AFTER SURGERY     Family History  Problem Relation Age of Onset  . Brain  cancer Maternal Grandmother   . Coronary artery disease Unknown        no premature CAD    Past Surgical History:  Procedure Laterality Date  . ANOMALOUS PULMONARY VENOUS RETURN REPAIR    . BACK SURGERY  1970   . EYE SURGERY     AS BABY- RT EYE  POOR SIGHT  . INTRAMEDULLARY (IM) NAIL INTERTROCHANTERIC Right 04/20/2017   Procedure: INTRAMEDULLARY (IM) NAIL INTERTROCHANTRIC;  Surgeon: Leandrew Koyanagi, MD;  Location: [place];  Service: Orthopedics;  Laterality: Right;  . LUMBAR LAMINECTOMY/DECOMPRESSION MICRODISCECTOMY  05/31/2011   Procedure: LUMBAR LAMINECTOMY/DECOMPRESSION MICRODISCECTOMY;  Surgeon:  Elaina Hoops, MD;  Location: [place] NEUR[facility]S;  Service: Neurosurgery;  Laterality: Bilateral;  Bilateral Lumbar three-four decompressionj lumbar laminectomy   . THORACIC AORTIC ANEURYSM REPAIR  1/10   VALVE ALSO DONE  . THORACOABDOMINAL AORTIC ANEURYSM REPAIR  01/28/09   repair  . TONSILLECTOMY     Social History   Occupational History  . Occupation: Retired  Tobacco Use  . Smoking status: Former Smoker    Packs/day: 1.00    Years: 20.00    Pack years: 20.00    Types: Cigarettes    Last attempt to quit: 04/26/2004    Years since quitting: 13.1  . Smokeless tobacco: Never Used  . Tobacco comment: quit in 2006  Substance and Sexual Activity  . Alcohol use: No  . Drug use: No  . Sexual activity: Yes    Birth control/protection: Post-menopausal

## 2017-06-07 DIAGNOSIS — Z4789 Encounter for other orthopedic aftercare: Secondary | ICD-10-CM | POA: Diagnosis not present

## 2017-06-07 DIAGNOSIS — R2681 Unsteadiness on feet: Secondary | ICD-10-CM | POA: Diagnosis not present

## 2017-06-07 DIAGNOSIS — R278 Other lack of coordination: Secondary | ICD-10-CM | POA: Diagnosis not present

## 2017-06-07 DIAGNOSIS — J188 Other pneumonia, unspecified organism: Secondary | ICD-10-CM | POA: Diagnosis not present

## 2017-06-07 DIAGNOSIS — L988 Other specified disorders of the skin and subcutaneous tissue: Secondary | ICD-10-CM | POA: Diagnosis not present

## 2017-06-07 DIAGNOSIS — R0602 Shortness of breath: Secondary | ICD-10-CM | POA: Diagnosis not present

## 2017-06-07 DIAGNOSIS — S72001D Fracture of unspecified part of neck of right femur, subsequent encounter for closed fracture with routine healing: Secondary | ICD-10-CM | POA: Diagnosis not present

## 2017-06-08 DIAGNOSIS — S72001D Fracture of unspecified part of neck of right femur, subsequent encounter for closed fracture with routine healing: Secondary | ICD-10-CM | POA: Diagnosis not present

## 2017-06-08 DIAGNOSIS — R2681 Unsteadiness on feet: Secondary | ICD-10-CM | POA: Diagnosis not present

## 2017-06-08 DIAGNOSIS — Z4789 Encounter for other orthopedic aftercare: Secondary | ICD-10-CM | POA: Diagnosis not present

## 2017-06-08 DIAGNOSIS — J188 Other pneumonia, unspecified organism: Secondary | ICD-10-CM | POA: Diagnosis not present

## 2017-06-08 DIAGNOSIS — R0602 Shortness of breath: Secondary | ICD-10-CM | POA: Diagnosis not present

## 2017-06-08 DIAGNOSIS — R278 Other lack of coordination: Secondary | ICD-10-CM | POA: Diagnosis not present

## 2017-06-09 DIAGNOSIS — Z4789 Encounter for other orthopedic aftercare: Secondary | ICD-10-CM | POA: Diagnosis not present

## 2017-06-09 DIAGNOSIS — R0602 Shortness of breath: Secondary | ICD-10-CM | POA: Diagnosis not present

## 2017-06-09 DIAGNOSIS — J188 Other pneumonia, unspecified organism: Secondary | ICD-10-CM | POA: Diagnosis not present

## 2017-06-09 DIAGNOSIS — S72001D Fracture of unspecified part of neck of right femur, subsequent encounter for closed fracture with routine healing: Secondary | ICD-10-CM | POA: Diagnosis not present

## 2017-06-09 DIAGNOSIS — R2681 Unsteadiness on feet: Secondary | ICD-10-CM | POA: Diagnosis not present

## 2017-06-09 DIAGNOSIS — R278 Other lack of coordination: Secondary | ICD-10-CM | POA: Diagnosis not present

## 2017-06-10 DIAGNOSIS — I10 Essential (primary) hypertension: Secondary | ICD-10-CM | POA: Diagnosis not present

## 2017-06-11 DIAGNOSIS — I10 Essential (primary) hypertension: Secondary | ICD-10-CM | POA: Diagnosis not present

## 2017-06-11 DIAGNOSIS — Z79899 Other long term (current) drug therapy: Secondary | ICD-10-CM | POA: Diagnosis not present

## 2017-06-13 DIAGNOSIS — Z4789 Encounter for other orthopedic aftercare: Secondary | ICD-10-CM | POA: Diagnosis not present

## 2017-06-13 DIAGNOSIS — R278 Other lack of coordination: Secondary | ICD-10-CM | POA: Diagnosis not present

## 2017-06-13 DIAGNOSIS — S72001D Fracture of unspecified part of neck of right femur, subsequent encounter for closed fracture with routine healing: Secondary | ICD-10-CM | POA: Diagnosis not present

## 2017-06-13 DIAGNOSIS — J188 Other pneumonia, unspecified organism: Secondary | ICD-10-CM | POA: Diagnosis not present

## 2017-06-13 DIAGNOSIS — R2681 Unsteadiness on feet: Secondary | ICD-10-CM | POA: Diagnosis not present

## 2017-06-13 DIAGNOSIS — R0602 Shortness of breath: Secondary | ICD-10-CM | POA: Diagnosis not present

## 2017-06-14 DIAGNOSIS — Z4789 Encounter for other orthopedic aftercare: Secondary | ICD-10-CM | POA: Diagnosis not present

## 2017-06-14 DIAGNOSIS — R0602 Shortness of breath: Secondary | ICD-10-CM | POA: Diagnosis not present

## 2017-06-14 DIAGNOSIS — R2681 Unsteadiness on feet: Secondary | ICD-10-CM | POA: Diagnosis not present

## 2017-06-14 DIAGNOSIS — J188 Other pneumonia, unspecified organism: Secondary | ICD-10-CM | POA: Diagnosis not present

## 2017-06-14 DIAGNOSIS — S72001D Fracture of unspecified part of neck of right femur, subsequent encounter for closed fracture with routine healing: Secondary | ICD-10-CM | POA: Diagnosis not present

## 2017-06-14 DIAGNOSIS — R278 Other lack of coordination: Secondary | ICD-10-CM | POA: Diagnosis not present

## 2017-06-14 DIAGNOSIS — I10 Essential (primary) hypertension: Secondary | ICD-10-CM | POA: Diagnosis not present

## 2017-06-15 DIAGNOSIS — R278 Other lack of coordination: Secondary | ICD-10-CM | POA: Diagnosis not present

## 2017-06-15 DIAGNOSIS — R2681 Unsteadiness on feet: Secondary | ICD-10-CM | POA: Diagnosis not present

## 2017-06-15 DIAGNOSIS — J188 Other pneumonia, unspecified organism: Secondary | ICD-10-CM | POA: Diagnosis not present

## 2017-06-15 DIAGNOSIS — R0602 Shortness of breath: Secondary | ICD-10-CM | POA: Diagnosis not present

## 2017-06-15 DIAGNOSIS — Z4789 Encounter for other orthopedic aftercare: Secondary | ICD-10-CM | POA: Diagnosis not present

## 2017-06-15 DIAGNOSIS — S72001D Fracture of unspecified part of neck of right femur, subsequent encounter for closed fracture with routine healing: Secondary | ICD-10-CM | POA: Diagnosis not present

## 2017-06-16 DIAGNOSIS — I10 Essential (primary) hypertension: Secondary | ICD-10-CM | POA: Diagnosis not present

## 2017-06-16 DIAGNOSIS — Z79899 Other long term (current) drug therapy: Secondary | ICD-10-CM | POA: Diagnosis not present

## 2017-06-16 DIAGNOSIS — R509 Fever, unspecified: Secondary | ICD-10-CM | POA: Diagnosis not present

## 2017-06-21 ENCOUNTER — Encounter: Payer: Self-pay | Admitting: Pulmonary Disease

## 2017-06-21 ENCOUNTER — Ambulatory Visit (INDEPENDENT_AMBULATORY_CARE_PROVIDER_SITE_OTHER): Payer: Medicare Other | Admitting: Pulmonary Disease

## 2017-06-21 ENCOUNTER — Ambulatory Visit (INDEPENDENT_AMBULATORY_CARE_PROVIDER_SITE_OTHER)
Admission: RE | Admit: 2017-06-21 | Discharge: 2017-06-21 | Disposition: A | Discharge status: Home / Self Care | Payer: Medicare Other | Source: Ambulatory Visit | Attending: Pulmonary Disease | Admitting: Pulmonary Disease

## 2017-06-21 VITALS — BP 122/66 | HR 78

## 2017-06-21 DIAGNOSIS — R0600 Dyspnea, unspecified: Secondary | ICD-10-CM

## 2017-06-21 DIAGNOSIS — R0602 Shortness of breath: Secondary | ICD-10-CM | POA: Diagnosis not present

## 2017-06-21 DIAGNOSIS — S72001D Fracture of unspecified part of neck of right femur, subsequent encounter for closed fracture with routine healing: Secondary | ICD-10-CM | POA: Diagnosis not present

## 2017-06-21 DIAGNOSIS — R2681 Unsteadiness on feet: Secondary | ICD-10-CM | POA: Diagnosis not present

## 2017-06-21 DIAGNOSIS — J189 Pneumonia, unspecified organism: Secondary | ICD-10-CM

## 2017-06-21 DIAGNOSIS — J440 Chronic obstructive pulmonary disease with acute lower respiratory infection: Secondary | ICD-10-CM

## 2017-06-21 DIAGNOSIS — Z4789 Encounter for other orthopedic aftercare: Secondary | ICD-10-CM | POA: Diagnosis not present

## 2017-06-21 DIAGNOSIS — R278 Other lack of coordination: Secondary | ICD-10-CM | POA: Diagnosis not present

## 2017-06-21 DIAGNOSIS — J9611 Chronic respiratory failure with hypoxia: Secondary | ICD-10-CM | POA: Diagnosis not present

## 2017-06-21 DIAGNOSIS — J188 Other pneumonia, unspecified organism: Secondary | ICD-10-CM | POA: Diagnosis not present

## 2017-06-21 NOTE — Progress Notes (Signed)
Subjective:   PATIENT ID: Sarah Johnson GENDER: female DOB: 07-11-1932, MRN: [id number]  Synopsis: Former patient of Dr. Joya Gaskins with COPD  HPI  Chief Complaint  Patient presents with  . Follow-up    former PW pt being treated for COPD, Bronchitis.  c/o recurrent PNA, increased SOB.    Sarah is accompanied by her daughter and a friend who provide the history.  She has been told that she has a persistent case of pneumonia since she had a fall in December 2018.   > they say that when she is sick she will have abnormal lung exam and fatigue > she typically won't have much of a fever when she is sick > she doesn't cough much when sick > she will be more lethargic when it happens  Anemia: > she has recently been diagnosis  Recent hip fracture and surgical repair.  She has been told that she may have congestive hear failure as well.  She has been started on diuretics recently but last week she needed fluids.  She has lost 25 pounds since December.    Past Medical History:  Diagnosis Date  . Arthritis   . Blind right eye   . Cataract    RT  . CKD (chronic kidney disease)   . COPD (chronic obstructive pulmonary disease) ([place])   . Emphysema    followed by Dr. Joya Gaskins  . GERD (gastroesophageal reflux disease)   . History of Doppler echocardiogram    carotid dopplers. (8/11) no significant stenosis  . HTN (hypertension)   . Hx of echocardiogram 2015   Echo (08/2013):  EF 60-65%, no RWMA, Gr 1 DD, AVR ok (mean 5 mmHg), mild MR, PASP 22 mmHg  . Hyperlipidemia   . Hypothyroidism   . Pulmonary embolism ([place]) 2010   She is no onger taking coumadin  . S/P aortic valve replacement with bioprosthetic valve    ehco (8/11) with EF 55-60%, mild-mod MR, mild-mod TR, poorly visualized bioprosthetic aortic valve but no significant regurgitation and gradient not significanly elevated.    . S/P thoracic aortic aneurysm repair 1/10   also with bioprosthetic aortic valve prelacement  [facility]). in 10/10 she has a descending thoraic aorta [facility]ysm [facility] Vascular urgeon is Dr. Lunette Stands.   Marland Kitchen SVT (supraventricular tachycardia) ([facility])    HOLTER MONITOR AFTER SURGERY      Family History  Problem Relation Age of Onset  . Brain cancer Maternal Grandmother   . Coronary artery disease Unknown        no premature CAD     Social History   Socioeconomic History  . Marital status: Married    Spouse name: Not on file  . Number of children: 3  . Years of education: Not on file  . Highest education level: Not on file  Social Needs  . Financial resource strain: Not on file  . Food insecurity - worry: Not on file  . Food insecurity - inability: Not on file  . Transportation needs - medical: Not on file  . Transportation needs - non-medical: Not on file  Occupational History  . Occupation: Retired  Tobacco Use  . Smoking status: Former Smoker    Packs/day: 1.00    Years: 20.00    Pack years: 20.00    Types: Cigarettes    Last attempt to quit: 04/26/2004    Years since quitting: 13.1  . Smokeless tobacco: Never Used  . Tobacco comment: quit in  2006  Substance and Sexual Activity  . Alcohol use: No  . Drug use: No  . Sexual activity: Yes    Birth control/protection: Post-menopausal  Other Topics Concern  . 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 Codeine Other (See Comments)    Pt. States it has been too long to remember the reaction   . Penicillins Other (See Comments)    Pt. Does not recall the reaction.  Has patient had a PCN reaction causing immediate rash, facial/tongue/throat swelling, SOB or lightheadedness with hypotension:  Has patient had a PCN reaction causing severe rash involving mucus membranes or skin necrosis: Has patient had a PCN reaction that required hospitalization  Has  patient had a PCN reaction occurring within the last 10 years:  If all of the above answers are "NO", then may proceed with Cephalosporin use.      Outpatient Medications Prior to Visit  Medication Sig Dispense Refill  . atorvastatin (LIPITOR) 20 MG tablet TAKE 1 TABLET BY MOUTH EVERY DAY 30 tablet 3  . donepezil (ARICEPT) 10 MG tablet TAKE 1 TABLET (10 MG TOTAL) BY MOUTH AT BEDTIME. 30 tablet 0  . ibuprofen (ADVIL,MOTRIN) 200 MG tablet Take 2 tablets (400 mg total) by mouth daily as needed for moderate pain. 30 tablet 0  . ipratropium-albuterol (DUONEB) 0.5-2.5 (3) MG/3ML SOLN Take 3 mLs by nebulization 2 (two) times daily. Dx: J44.9 (Patient taking differently: Take 3 mLs by nebulization every 4 (four) hours as needed (SOB). Dx: J44.9) 180 mL 5  . L-Methylfolate-B12-B6-B2 (CEREFOLIN PO) Take 1 tablet by mouth daily.    Marland Kitchen levothyroxine (SYNTHROID, LEVOTHROID) 112 MCG tablet Take 137 mcg by mouth daily.   12  . memantine (NAMENDA) 10 MG tablet Take 10 mg by mouth 2 (two) times daily.   1  . metoprolol tartrate (LOPRESSOR) 25 MG tablet Take 0.5 tablets (12.5 mg total) by mouth 2 (two) times daily. 60 tablet 0  . mometasone-formoterol (DULERA) 100-5 MCG/ACT AERO INHALE 2 PUFFS INTO THE LUNGS 2 TIMES DAILY. RINSE MOUTH AFTER EACH USE 1 Inhaler 0  . Multiple Vitamin (MULITIVITAMIN WITH MINERALS) TABS Take 1 tablet by mouth daily.    Marland Kitchen oxyCODONE (OXY IR/ROXICODONE) 5 MG immediate release tablet Take 1-3 tablets (5-15 mg total) by mouth every 4 (four) hours as needed. 30 tablet 0  . pantoprazole (PROTONIX) 40 MG tablet Take 1 tablet (40 mg total) by mouth daily at 6 (six) AM. 30 tablet 0  . PROAIR HFA 108 (90 BASE) MCG/ACT inhaler Inhale 2 puffs into the lungs every 6 (six) hours as needed. (Patient taking differently: Inhale 2 puffs into the lungs every 6 (six) hours as needed for wheezing or shortness of breath. ) 16 g 11  . Rivaroxaban (XARELTO) 15 MG TABS tablet Take 1 tablet (15 mg total) by mouth  2 (two) times daily with a meal. 36 tablet 0  . saccharomyces boulardii (FLORASTOR) 250 MG capsule Take 1 capsule (250 mg total) by mouth 2 (two) times daily. 60 capsule 0  . sertraline (ZOLOFT) 50 MG tablet Take 50 mg by mouth at bedtime.     . thiamine 100 MG tablet Take 1 tablet (100 mg total) by mouth daily. 30 tablet 0   No facility-administered medications prior to visit.     Review of Systems  Constitutional: Positive  for malaise/fatigue. Negative for diaphoresis.  HENT: Negative for sinus pain.   Respiratory: Positive for shortness of breath. Negative for cough, sputum production and stridor.   Cardiovascular: Positive for leg swelling. Negative for orthopnea and PND.  Neurological: Positive for weakness.      Objective:  Physical Exam   Vitals:   06/21/17 1519  BP: 122/66  Pulse: 78  SpO2: 90%    Gen: chronically ill appearing in wheelchair HENT: OP clear, TM's clear, neck supple PULM: Crackles left base B, normal percussion CV: RRR, no mgr, trace edema GI: BS+, soft, nontender Derm: no cyanosis or rash Psyche: normal mood and affect   CBC    Component Value Date/Time   WBC 16.8 (H) 04/24/2017 1110   RBC 2.99 (L) 04/24/2017 1110   HGB 9.5 (L) 04/24/2017 1121   HCT 28.0 (L) 04/24/2017 1121   PLT  04/24/2017 1110    PLATELET CLUMPS NOTED ON SMEAR, COUNT APPEARS ADEQUATE   MCV 93.3 04/24/2017 1110   MCH 29.8 04/24/2017 1110   MCHC 31.9 04/24/2017 1110   RDW 17.0 (H) 04/24/2017 1110   LYMPHSABS 3.2 04/24/2017 1110   MONOABS 1.8 (H) 04/24/2017 1110   EOSABS 0.2 04/24/2017 1110   BASOSABS 0.0 04/24/2017 1110     Chest imaging:  PFT:  2010 lung function testing ratio 75%, FEV1 0.96 L 52% predicted, total lung capacity 3.58 L 78% predicted, DLCO 8.63 mL 43% predicted  Labs:  Path:  Echo:  December 2018 echocardiogram showed PA pressure of 37 mmHg LVEF 60-65%  Heart Catheterization:   December 2018 discharge summary reviewed showing where she  was treated for a right hip fracture after a fall.  She has a history of bioprosthetic aortic valve replacement and COPD.  She also has a history of DVT.    Assessment & Plan:   Dyspnea, unspecified type - Plan: DG Chest 2 View, Pulmonary function test, SLP modified barium swallow  Chronic respiratory failure with hypoxia ([place]) - Plan: Pulmonary function test, SLP modified barium swallow  COPD with acute lower respiratory infection ([place])  Recurrent pneumonia  Discussion: This is the first time I have seen Ms. Johnson.  She is very ill from chronic respiratory failure with hypoxemia and severe COPD.  In the last 2 months she is lost a significant amount of weight after her hip fracture and it sounds as if she is having recurrent pneumonia.  I explained to them that I worry about either resistant organisms, recurrent aspiration, or less likely an alternative diagnosis like malignancy when considering her current pneumonia.  I think in her case we need to evaluate for aspiration.  I need to get a chest x-ray so I can see the infiltrates myself and then decide if we need to get a CT scan of her chest.  I worry that she is hypercarbic as they describe frequent episodes of excessive sleepiness when she has respiratory problems.  Her family does not feel she would be compliant with any sort of noninvasive mechanical ventilatory device.  Overall I feel that her prognosis is poor, I expressed this concern with her family today.  Severe COPD: Continue DuoNeb twice a day Continue Dulera twice a day  Chronic respiratory failure with hypoxemia: Use 2 L of oxygen continuously, the goal O2 saturation should be 90-94%.  Recurrent pneumonia: Chest x-ray today, depending on results we may need to get a CT scan We will arrange for a swallowing evaluation  Severe deconditioning: Continue to participate in  physical therapy as directed  I would like for you to follow-up with 1 of our nurse practitioners  in 1 week  30 minutes spent with patient and her family, greater than 40 minutes spent on this visit today  Current Outpatient Medications:  .  atorvastatin (LIPITOR) 20 MG tablet, TAKE 1 TABLET BY MOUTH EVERY DAY, Disp: 30 tablet, Rfl: 3 .  donepezil (ARICEPT) 10 MG tablet, TAKE 1 TABLET (10 MG TOTAL) BY MOUTH AT BEDTIME., Disp: 30 tablet, Rfl: 0 .  ibuprofen (ADVIL,MOTRIN) 200 MG tablet, Take 2 tablets (400 mg total) by mouth daily as needed for moderate pain., Disp: 30 tablet, Rfl: 0 .  ipratropium-albuterol (DUONEB) 0.5-2.5 (3) MG/3ML SOLN, Take 3 mLs by nebulization 2 (two) times daily. Dx: J44.9 (Patient taking differently: Take 3 mLs by nebulization every 4 (four) hours as needed (SOB). Dx: J44.9), Disp: 180 mL, Rfl: 5 .  L-Methylfolate-B12-B6-B2 (CEREFOLIN PO), Take 1 tablet by mouth daily., Disp: , Rfl:  .  levothyroxine (SYNTHROID, LEVOTHROID) 112 MCG tablet, Take 137 mcg by mouth daily. , Disp: , Rfl: 12 .  memantine (NAMENDA) 10 MG tablet, Take 10 mg by mouth 2 (two) times daily. , Disp: , Rfl: 1 .  metoprolol tartrate (LOPRESSOR) 25 MG tablet, Take 0.5 tablets (12.5 mg total) by mouth 2 (two) times daily., Disp: 60 tablet, Rfl: 0 .  mometasone-formoterol (DULERA) 100-5 MCG/ACT AERO, INHALE 2 PUFFS INTO THE LUNGS 2 TIMES DAILY. RINSE MOUTH AFTER EACH USE, Disp: 1 Inhaler, Rfl: 0 .  Multiple Vitamin (MULITIVITAMIN WITH MINERALS) TABS, Take 1 tablet by mouth daily., Disp: , Rfl:  .  oxyCODONE (OXY IR/ROXICODONE) 5 MG immediate release tablet, Take 1-3 tablets (5-15 mg total) by mouth every 4 (four) hours as needed., Disp: 30 tablet, Rfl: 0 .  pantoprazole (PROTONIX) 40 MG tablet, Take 1 tablet (40 mg total) by mouth daily at 6 (six) AM., Disp: 30 tablet, Rfl: 0 .  PROAIR HFA 108 (90 BASE) MCG/ACT inhaler, Inhale 2 puffs into the lungs every 6 (six) hours as needed. (Patient taking differently: Inhale 2 puffs into the lungs every 6 (six) hours as needed for wheezing or shortness of breath.  ), Disp: 16 g, Rfl: 11 .  Rivaroxaban (XARELTO) 15 MG TABS tablet, Take 1 tablet (15 mg total) by mouth 2 (two) times daily with a meal., Disp: 36 tablet, Rfl: 0 .  saccharomyces boulardii (FLORASTOR) 250 MG capsule, Take 1 capsule (250 mg total) by mouth 2 (two) times daily., Disp: 60 capsule, Rfl: 0 .  sertraline (ZOLOFT) 50 MG tablet, Take 50 mg by mouth at bedtime. , Disp: , Rfl:  .  thiamine 100 MG tablet, Take 1 tablet (100 mg total) by mouth daily., Disp: 30 tablet, Rfl: 0

## 2017-06-21 NOTE — Patient Instructions (Signed)
Severe COPD: Continue DuoNeb twice a day Continue Dulera twice a day  Chronic respiratory failure with hypoxemia: Use 2 L of oxygen continuously, the goal O2 saturation should be 90-94%.  Recurrent pneumonia: Chest x-ray today, depending on results we may need to get a CT scan We will arrange for a swallowing evaluation  Severe deconditioning: Continue to participate in physical therapy as directed  I would like for you to follow-up with 1 of our nurse practitioners in 1 week

## 2017-06-22 DIAGNOSIS — Z4789 Encounter for other orthopedic aftercare: Secondary | ICD-10-CM | POA: Diagnosis not present

## 2017-06-22 DIAGNOSIS — R278 Other lack of coordination: Secondary | ICD-10-CM | POA: Diagnosis not present

## 2017-06-22 DIAGNOSIS — R0602 Shortness of breath: Secondary | ICD-10-CM | POA: Diagnosis not present

## 2017-06-22 DIAGNOSIS — S72001D Fracture of unspecified part of neck of right femur, subsequent encounter for closed fracture with routine healing: Secondary | ICD-10-CM | POA: Diagnosis not present

## 2017-06-22 DIAGNOSIS — R2681 Unsteadiness on feet: Secondary | ICD-10-CM | POA: Diagnosis not present

## 2017-06-22 DIAGNOSIS — J188 Other pneumonia, unspecified organism: Secondary | ICD-10-CM | POA: Diagnosis not present

## 2017-06-23 DIAGNOSIS — I10 Essential (primary) hypertension: Secondary | ICD-10-CM | POA: Diagnosis not present

## 2017-06-23 DIAGNOSIS — Z79899 Other long term (current) drug therapy: Secondary | ICD-10-CM | POA: Diagnosis not present

## 2017-06-24 ENCOUNTER — Other Ambulatory Visit (HOSPITAL_COMMUNITY): Payer: Self-pay | Admitting: Pulmonary Disease

## 2017-06-24 DIAGNOSIS — R0602 Shortness of breath: Secondary | ICD-10-CM | POA: Diagnosis not present

## 2017-06-24 DIAGNOSIS — J188 Other pneumonia, unspecified organism: Secondary | ICD-10-CM | POA: Diagnosis not present

## 2017-06-24 DIAGNOSIS — R278 Other lack of coordination: Secondary | ICD-10-CM | POA: Diagnosis not present

## 2017-06-24 DIAGNOSIS — Z4789 Encounter for other orthopedic aftercare: Secondary | ICD-10-CM | POA: Diagnosis not present

## 2017-06-24 DIAGNOSIS — S72001D Fracture of unspecified part of neck of right femur, subsequent encounter for closed fracture with routine healing: Secondary | ICD-10-CM | POA: Diagnosis not present

## 2017-06-24 DIAGNOSIS — R2681 Unsteadiness on feet: Secondary | ICD-10-CM | POA: Diagnosis not present

## 2017-06-24 DIAGNOSIS — R1310 Dysphagia, unspecified: Secondary | ICD-10-CM

## 2017-06-28 ENCOUNTER — Ambulatory Visit (HOSPITAL_COMMUNITY): Payer: Medicare Other

## 2017-06-28 ENCOUNTER — Ambulatory Visit: Payer: Medicare Other | Admitting: Adult Health

## 2017-06-28 ENCOUNTER — Telehealth: Payer: Self-pay | Admitting: Hematology and Oncology

## 2017-06-28 ENCOUNTER — Encounter (HOSPITAL_COMMUNITY): Payer: Medicare Other

## 2017-06-28 NOTE — Telephone Encounter (Signed)
Appt has been scheduled with the pt's daughter for 3/14 at 240pm. Aware to arrive at 215pm so that her mom to be checked in on time.

## 2017-06-29 ENCOUNTER — Ambulatory Visit (HOSPITAL_COMMUNITY)
Admission: RE | Admit: 2017-06-29 | Discharge: 2017-06-29 | Disposition: A | Discharge status: Home / Self Care | Payer: Medicare Other | Source: Ambulatory Visit | Attending: Pulmonary Disease | Admitting: Pulmonary Disease

## 2017-06-29 DIAGNOSIS — J9611 Chronic respiratory failure with hypoxia: Secondary | ICD-10-CM

## 2017-06-29 DIAGNOSIS — J188 Other pneumonia, unspecified organism: Secondary | ICD-10-CM | POA: Diagnosis not present

## 2017-06-29 DIAGNOSIS — Z8701 Personal history of pneumonia (recurrent): Secondary | ICD-10-CM | POA: Diagnosis not present

## 2017-06-29 DIAGNOSIS — R278 Other lack of coordination: Secondary | ICD-10-CM | POA: Diagnosis not present

## 2017-06-29 DIAGNOSIS — R2681 Unsteadiness on feet: Secondary | ICD-10-CM | POA: Diagnosis not present

## 2017-06-29 DIAGNOSIS — R0600 Dyspnea, unspecified: Secondary | ICD-10-CM | POA: Diagnosis not present

## 2017-06-29 DIAGNOSIS — R1310 Dysphagia, unspecified: Secondary | ICD-10-CM | POA: Insufficient documentation

## 2017-06-29 DIAGNOSIS — Z4789 Encounter for other orthopedic aftercare: Secondary | ICD-10-CM | POA: Diagnosis not present

## 2017-06-29 DIAGNOSIS — S72001D Fracture of unspecified part of neck of right femur, subsequent encounter for closed fracture with routine healing: Secondary | ICD-10-CM | POA: Diagnosis not present

## 2017-06-29 DIAGNOSIS — R0602 Shortness of breath: Secondary | ICD-10-CM | POA: Diagnosis not present

## 2017-06-29 DIAGNOSIS — J449 Chronic obstructive pulmonary disease, unspecified: Secondary | ICD-10-CM | POA: Insufficient documentation

## 2017-06-29 DIAGNOSIS — J189 Pneumonia, unspecified organism: Secondary | ICD-10-CM | POA: Diagnosis not present

## 2017-06-29 LAB — BLOOD GAS, ARTERIAL
Acid-Base Excess: 10.1 mmol/L — ABNORMAL HIGH (ref 0.0–2.0)
Bicarbonate: 34.6 mmol/L — ABNORMAL HIGH (ref 20.0–28.0)
Drawn by: 20517
O2 Content: 3 L/min
O2 Saturation: 94.3 %
Patient temperature: 98.6
pCO2 arterial: 50.8 mmHg — ABNORMAL HIGH (ref 32.0–48.0)
pH, Arterial: 7.448 (ref 7.350–7.450)
pO2, Arterial: 73.7 mmHg — ABNORMAL LOW (ref 83.0–108.0)

## 2017-06-29 NOTE — Progress Notes (Signed)
Modified Barium Swallow Progress Note  Patient Details  Name: DENEICE WACK MRN: [id number] Date of Birth: 16-Feb-1933  Today's Date: 06/29/2017  Modified Barium Swallow completed.  Full report located under Chart Review in the Imaging Section.  Brief recommendations include the following:  Clinical Impression  Pt demonstrates normal oral and oropharyngeal function for age. Pt has mildly prolonged mastication of solids and slightly delayed laryngeal closure with flash penetration of thin liquids, not outside of nomral limits for age. No aspiration observed. Esophageal sweep did show diffuse residuals following minimal intake of solids. Finding would require further esophageal work up, but could explain poor oral intake and raises concern for risk of postprandial aspiration or GER. Offered basic esopahgeal precautions to daughter. No diet modification needed.    Swallow Evaluation Recommendations   Recommended Consults: Consider esophageal assessment;Consider GI evaluation   SLP Diet Recommendations: Regular solids;Thin liquid   Liquid Administration via: Cup;Straw   Medication Administration: Whole meds with liquid   Supervision: Patient able to self feed   Compensations: Slow rate;Small sips/bites;Follow solids with liquid   Postural Changes: Remain semi-upright after after feeds/meals (Comment);Seated upright at 90 degrees           [facility], [place] CCC-SLP [phone]  Zeric Baranowski, Katherene Ponto 06/29/2017,1:51 PM

## 2017-06-30 ENCOUNTER — Encounter (INDEPENDENT_AMBULATORY_CARE_PROVIDER_SITE_OTHER): Payer: Self-pay | Admitting: Orthopaedic Surgery

## 2017-06-30 ENCOUNTER — Ambulatory Visit (INDEPENDENT_AMBULATORY_CARE_PROVIDER_SITE_OTHER): Payer: Medicare Other | Admitting: Orthopaedic Surgery

## 2017-06-30 ENCOUNTER — Ambulatory Visit (INDEPENDENT_AMBULATORY_CARE_PROVIDER_SITE_OTHER): Payer: Medicare Other

## 2017-06-30 DIAGNOSIS — Z4789 Encounter for other orthopedic aftercare: Secondary | ICD-10-CM | POA: Diagnosis not present

## 2017-06-30 DIAGNOSIS — R0602 Shortness of breath: Secondary | ICD-10-CM | POA: Diagnosis not present

## 2017-06-30 DIAGNOSIS — S72001D Fracture of unspecified part of neck of right femur, subsequent encounter for closed fracture with routine healing: Secondary | ICD-10-CM | POA: Diagnosis not present

## 2017-06-30 DIAGNOSIS — S72141D Displaced intertrochanteric fracture of right femur, subsequent encounter for closed fracture with routine healing: Secondary | ICD-10-CM

## 2017-06-30 DIAGNOSIS — J188 Other pneumonia, unspecified organism: Secondary | ICD-10-CM | POA: Diagnosis not present

## 2017-06-30 DIAGNOSIS — R2681 Unsteadiness on feet: Secondary | ICD-10-CM | POA: Diagnosis not present

## 2017-06-30 DIAGNOSIS — R278 Other lack of coordination: Secondary | ICD-10-CM | POA: Diagnosis not present

## 2017-06-30 NOTE — Progress Notes (Signed)
Post-Op Visit Note   Patient: Sarah Johnson           Date of Birth: [birth year]           MRN: [id number] Visit Date: 06/30/2017 PCP: Jani Gravel, MD   Assessment & Plan:  Chief Complaint:  Chief Complaint  Patient presents with  . Right Hip - Pain, Follow-up   Visit Diagnoses:  1. Closed displaced intertrochanteric fracture of right femur with routine healing, subsequent encounter     Plan: Areyana comes in for follow-up.  71 days status post intramedullary nail from a right hip fracture, date of surgery 04/20/2017.  She has been in collapse nursing facility.  She denies pain but she has a fair amount of weakness.  Her daughter who is with her today states that the patient can hardly transfer from bed to chair.  About 3 weeks ago she was able to transfer much better.  Of note she has had 4 bouts of pneumonia in the past month.  She notes significant strength when she was recently put on prednisone.  She has since declined.  She is still having issues with memory.  She is on the waiting list to be transferred to [facility].  Examination of the right hip shows full motion but not strength.  We have recommended increasing physical therapy from 2-3 days a week to daily.  She will follow-up with [place] in 6 weeks time for repeat evaluation and x-ray. Follow-Up Instructions: Return in about 6 weeks (around 08/11/2017).   Orders:  Orders Placed This Encounter  Procedures  . XR HIP UNILAT W OR W/O PELVIS 2-3 VIEWS RIGHT   No orders of the defined types were placed in this encounter.   Imaging: Dg Op Swallowing Func-medicare/speech Path  Result Date: 06/29/2017 Objective Swallowing Evaluation: Type of Study: Bedside Swallow Evaluation  Patient Details Name: JENISA MONTY MRN: [id number] Date of Birth: 1932-06-13 Today's Date: 06/29/2017 Time: SLP Start Time (ACUTE ONLY): 1100 -SLP Stop Time (ACUTE ONLY): 1120 SLP Time Calculation (min) (ACUTE ONLY): 20 min Past Medical History: Past  Medical History: Diagnosis Date . Arthritis  . Blind right eye  . Cataract   RT . CKD (chronic kidney disease)  . COPD (chronic obstructive pulmonary disease) ([place])  . Emphysema   followed by Dr. Joya Gaskins . GERD (gastroesophageal reflux disease)  . History of Doppler echocardiogram   carotid dopplers. (8/11) no significant stenosis . HTN (hypertension)  . Hx of echocardiogram 2015  Echo (08/2013):  EF 60-65%, no RWMA, Gr 1 DD, AVR ok (mean 5 mmHg), mild MR, PASP 22 mmHg . Hyperlipidemia  . Hypothyroidism  . Pulmonary embolism ([place]) 2010  She is no onger taking coumadin . S/P aortic valve replacement with bioprosthetic valve   ehco (8/11) with EF 55-60%, mild-mod MR, mild-mod TR, poorly visualized bioprosthetic aortic valve but no significant regurgitation and gradient not significanly elevated.   . S/P thoracic aortic aneurysm repair 1/10  also with bioprosthetic aortic valve prelacement [facility]). in 10/10 she has a descending thoraic aorta anuerysm and abdominal reapir [facility]). Vascular urgeon is Dr. Lunette Stands.  Marland Kitchen SVT (supraventricular tachycardia) (HCC)   HOLTER MONITOR AFTER SURGERY  Past Surgical History: Past Surgical History: Procedure Laterality Date . ANOMALOUS PULMONARY VENOUS RETURN REPAIR   . BACK SURGERY  1970  . EYE SURGERY    AS BABY- RT EYE  POOR SIGHT . INTRAMEDULLARY (IM) NAIL INTERTROCHANTERIC Right 04/20/2017  Procedure: INTRAMEDULLARY (IM) NAIL INTERTROCHANTRIC;  Surgeon:  Leandrew Koyanagi, MD;  Location: [place];  Service: Orthopedics;  Laterality: Right; . LUMBAR LAMINECTOMY/DECOMPRESSION MICRODISCECTOMY  05/31/2011  Procedure: LUMBAR LAMINECTOMY/DECOMPRESSION MICRODISCECTOMY;  Surgeon: Elaina Hoops, MD;  Location: [place] [facility]UR[facility] OR[facility];  Service: Neurosurgery;  Laterality: Bilateral;  Bilateral Lumbar three-four decompressionj lumbar laminectomy  . THORACIC AORTIC ANEURYSM REPAIR  1/10  VALVE ALSO DONE . THORACOABDOMINAL AORTIC ANEURYSM REPAIR  01/28/09  repair . TONSILLECTOMY   HPI: Pt arrives for an  outpatient MBS, referred by pulmonologist due to recurrent pneumonia to determine possibility of aspiration as a potential cause. Daughter reports no observation of dysphagia, but oral intake has been poor and pt has been more lethargic, easily fatigues during all activities. Pt also has a history of COPD.  No Data Recorded Assessment / Plan / Recommendation CHL IP CLINICAL IMPRESSIONS 06/29/2017 Clinical Impression Pt demonstrates normal oral and oropharyngeal function for age. Pt has mildly prolonged mastication of solids and slightly delayed laryngeal closure with flash penetration of thin liquids, not outside of nomral limits for age. Esophageal sweep did show diffuse residuals following minimal intake of solids. Finding would require further esophageal work up, but could explain poor oral intake and raises concern for risk of postprandial aspiration or GER. Offered basic esophageal precautions to daughter. No diet modification needed.  SLP Visit Diagnosis Dysphagia, unspecified (R13.10) Attention and concentration deficit following -- Frontal lobe and executive function deficit following -- Impact on safety and function --   CHL IP TREATMENT RECOMMENDATION 06/29/2017 Treatment Recommendations No treatment recommended at this time   No flowsheet data found. CHL IP DIET RECOMMENDATION 06/29/2017 SLP Diet Recommendations Regular solids;Thin liquid Liquid Administration via Cup;Straw Medication Administration Whole meds with liquid Compensations Slow rate;Small sips/bites;Follow solids with liquid Postural Changes Remain semi-upright after after feeds/meals (Comment);Seated upright at 90 degrees   CHL IP OTHER RECOMMENDATIONS 06/29/2017 Recommended Consults Consider esophageal assessment;Consider GI evaluation Oral Care Recommendations -- Other Recommendations --   No flowsheet data found.  No flowsheet data found.     CHL IP ORAL PHASE 06/29/2017 Oral Phase WFL Oral - Pudding Teaspoon -- Oral - Pudding Cup -- Oral - Honey  Teaspoon -- Oral - Honey Cup -- Oral - Nectar Teaspoon -- Oral - Nectar Cup -- Oral - Nectar Straw -- Oral - Thin Teaspoon -- Oral - Thin Cup -- Oral - Thin Straw -- Oral - Puree -- Oral - Mech Soft -- Oral - Regular -- Oral - Multi-Consistency -- Oral - Pill -- Oral Phase - Comment --  CHL IP PHARYNGEAL PHASE 06/29/2017 Pharyngeal Phase WFL Pharyngeal- Pudding Teaspoon -- Pharyngeal -- Pharyngeal- Pudding Cup -- Pharyngeal -- Pharyngeal- Honey Teaspoon -- Pharyngeal -- Pharyngeal- Honey Cup -- Pharyngeal -- Pharyngeal- Nectar Teaspoon -- Pharyngeal -- Pharyngeal- Nectar Cup -- Pharyngeal -- Pharyngeal- Nectar Straw -- Pharyngeal -- Pharyngeal- Thin Teaspoon -- Pharyngeal -- Pharyngeal- Thin Cup -- Pharyngeal -- Pharyngeal- Thin Straw -- Pharyngeal -- Pharyngeal- Puree -- Pharyngeal -- Pharyngeal- Mechanical Soft -- Pharyngeal -- Pharyngeal- Regular -- Pharyngeal -- Pharyngeal- Multi-consistency -- Pharyngeal -- Pharyngeal- Pill -- Pharyngeal -- Pharyngeal Comment --  No flowsheet data found. No flowsheet data found. Herbie [place], [place] CCC-SLP [phone] Lynann Beaver 06/29/2017, 1:53 PM            CLINICAL DATA:  Recurrent pneumonia.  Concern for aspiration. EXAM: MODIFIED BARIUM SWALLOW TECHNIQUE: Different consistencies of barium were administered orally to the patient by the Speech Pathologist. Imaging of the pharynx was performed in the lateral projection. FLUOROSCOPY TIME:  Fluoroscopy Time:  1 minutes and 58 seconds Radiation Exposure Index (if provided by the fluoroscopic device): NA Number of Acquired Spot Images: 0 COMPARISON:  Chest radiographs 06/21/2017.  Chest CT 04/24/2017. FINDINGS: Thin liquid-flash penetration without aspiration.  Otherwise normal. Nectar thick liquid-not administered. Honey-not administered. Pure- within normal limits Pure with cracker-mildly delayed oral transit. No penetration or aspiration. Limited visualization of the thoracic esophagus demonstrates mild dysmotility.  Barium tablet-patient unable to swallow a barium tablet. IMPRESSION: Flash penetration with thin barium only. No aspiration demonstrated with any consistencies. Please refer to the Speech Pathologists report for complete details and recommendations. Electronically Signed   By: Richardean Sale M.D.   On: 06/29/2017 11:34   Xr Hip Unilat W Or W/o Pelvis 2-3 Views Right  Result Date: 06/30/2017 Stable alignment of the fracture   PMFS History: Patient Active Problem List   Diagnosis Date Noted  . Closed displaced intertrochanteric fracture of right femur ([place]) 04/18/2017  . Preoperative evaluation of a medical condition to rule out surgical contraindications (TAR required)   . Cellulitis 11/28/2015  . Cellulitis of right hand 11/28/2015  . Hypothyroidism 11/28/2015  . COPD (chronic obstructive pulmonary disease) ([place]) 09/25/2015  . Leukocytosis 02/11/2015  . Hypokalemia 02/11/2015  . Hypomagnesemia 02/11/2015  . Deep vein thrombosis (DVT) of right lower extremity ([place]) 02/08/2015  . CKD (chronic kidney disease), stage II 02/05/2015  . Anemia of chronic renal failure, stage 2 (mild) 02/05/2015  . Thrombocytopenia ([place]) 02/05/2015  . Left lower lobe pneumonia ([place]) 02/05/2015  . Sepsis ([place])   . History of Clostridium difficile colitis 05/09/2014  . History of aortic valve replacement 11/05/2010  . HTN (hypertension) 11/04/2009   Past Medical History:  Diagnosis Date  . Arthritis   . Blind right eye   . Cataract    RT  . CKD (chronic kidney disease)   . COPD (chronic obstructive pulmonary disease) ([place])   . Emphysema    followed by Dr. Joya Gaskins  . GERD (gastroesophageal reflux disease)   . History of Doppler echocardiogram    carotid dopplers. (8/11) no significant stenosis  . HTN (hypertension)   . Hx of echocardiogram 2015   Echo (08/2013):  EF 60-65%, no RWMA, Gr 1 DD, AVR ok (mean 5 mmHg), mild MR, PASP 22 mmHg  . Hyperlipidemia   . Hypothyroidism   . Pulmonary embolism ([place])  2010   She is no onger taking coumadin  . S/P aortic valve replacement with bioprosthetic valve    ehco (8/11) with EF 55-60%, mild-mod MR, mild-mod TR, poorly visualized bioprosthetic aortic valve but no significant regurgitation and gradient not significanly elevated.    . S/P thoracic aortic aneurysm repair 1/10   also with bioprosthetic aortic valve prelacement [facility]). in 10/10 she has a descending thoraic aorta anuerysm and abdominal reapir [facility]). Vascular urgeon is Dr. Lunette Stands.   Marland Kitchen SVT (supraventricular tachycardia) (HCC)    HOLTER MONITOR AFTER SURGERY     Family History  Problem Relation Age of Onset  . Brain cancer Maternal Grandmother   . Coronary artery disease Unknown        no premature CAD    Past Surgical History:  Procedure Laterality Date  . ANOMALOUS PULMONARY VENOUS RETURN REPAIR    . BACK SURGERY  1970   . EYE SURGERY     AS BABY- RT EYE  POOR SIGHT  . INTRAMEDULLARY (IM) NAIL INTERTROCHANTERIC Right 04/20/2017   Procedure: INTRAMEDULLARY (IM) NAIL INTERTROCHANTRIC;  Surgeon: Leandrew Koyanagi,  MD;  Location: [place];  Service: Orthopedics;  Laterality: Right;  . LUMBAR LAMINECTOMY/DECOMPRESSION MICRODISCECTOMY  05/31/2011   Procedure: LUMBAR LAMINECTOMY/DECOMPRESSION MICRODISCECTOMY;  Surgeon: Elaina Hoops, MD;  Location: [place] [facility]UR[facility];  Service: Neurosurgery;  Laterality: Bilateral;  Bilateral Lumbar three-four decompressionj lumbar laminectomy   . THORACIC AORTIC ANEURYSM REPAIR  1/10   VALVE ALSO DONE  . THORACOABDOMINAL AORTIC ANEURYSM REPAIR  01/28/09   repair  . TONSILLECTOMY     Social History   Occupational History  . Occupation: Retired  Tobacco Use  . Smoking status: Former Smoker    Packs/day: 1.00    Years: 20.00    Pack years: 20.00    Types: Cigarettes    Last attempt to quit: 04/26/2004    Years since quitting: 13.1  . Smokeless tobacco: Never Used  . Tobacco comment: quit in 2006  Substance and Sexual Activity  . Alcohol use: No    . Drug use: No  . Sexual activity: Yes    Birth control/protection: Post-menopausal

## 2017-07-01 ENCOUNTER — Ambulatory Visit (INDEPENDENT_AMBULATORY_CARE_PROVIDER_SITE_OTHER): Payer: Medicare Other | Admitting: Adult Health

## 2017-07-01 ENCOUNTER — Encounter: Payer: Self-pay | Admitting: Adult Health

## 2017-07-01 DIAGNOSIS — J188 Other pneumonia, unspecified organism: Secondary | ICD-10-CM | POA: Diagnosis not present

## 2017-07-01 DIAGNOSIS — J449 Chronic obstructive pulmonary disease, unspecified: Secondary | ICD-10-CM | POA: Diagnosis not present

## 2017-07-01 DIAGNOSIS — J181 Lobar pneumonia, unspecified organism: Secondary | ICD-10-CM

## 2017-07-01 DIAGNOSIS — R2681 Unsteadiness on feet: Secondary | ICD-10-CM | POA: Diagnosis not present

## 2017-07-01 DIAGNOSIS — S72001D Fracture of unspecified part of neck of right femur, subsequent encounter for closed fracture with routine healing: Secondary | ICD-10-CM | POA: Diagnosis not present

## 2017-07-01 DIAGNOSIS — J189 Pneumonia, unspecified organism: Secondary | ICD-10-CM

## 2017-07-01 DIAGNOSIS — R0602 Shortness of breath: Secondary | ICD-10-CM | POA: Diagnosis not present

## 2017-07-01 DIAGNOSIS — I82401 Acute embolism and thrombosis of unspecified deep veins of right lower extremity: Secondary | ICD-10-CM | POA: Diagnosis not present

## 2017-07-01 DIAGNOSIS — R278 Other lack of coordination: Secondary | ICD-10-CM | POA: Diagnosis not present

## 2017-07-01 DIAGNOSIS — Z4789 Encounter for other orthopedic aftercare: Secondary | ICD-10-CM | POA: Diagnosis not present

## 2017-07-01 NOTE — Patient Instructions (Addendum)
Severe COPD: Continue DuoNeb twice a day Continue Dulera twice a day Rinse after use.   Chronic respiratory failure with hypoxemia: Use 2 L of oxygen continuously, the goal O2 saturation should be 90-94%.   Severe deconditioning: Continue to participate in physical therapy as directed  DVT Decrease Xarelto 15mg  daily  Follow up with hematology next week as planned.    Follow up with Dr. Lake Bells in 3 months and As needed   Please contact office for sooner follow up if symptoms do not improve or worsen or seek emergency care

## 2017-07-01 NOTE — Progress Notes (Signed)
@Patient  ID: Sarah Johnson, female    DOB: 11/30/1932, 84 y.o.   MRN: [id number]  Chief Complaint  Patient presents with  . Follow-up    Referring provider: Jani Gravel, MD  HPI: 84 yo female with known moderate COPD (FEV1 50%-2010)  Hx VTE on Xarelto,  s/p AVR .  PMH significant for severe dementia, AV replacement .   PFT:  2010 lung function testing ratio 75%, FEV1 0.96 L 52% predicted, total lung capacity 3.58 L 78% predicted, DLCO 8.63 mL 43% predicted  Echo:  December 2018 echocardiogram showed PA pressure of 37 mmHg LVEF 60-65%  07/01/2017 Follow up : Pneumonia and COPD, O2 RF  Accompanied by her daughter who helps with her history.  Patient returns for a one-week follow-up .  Seen last visit.  Patient had a hip fracture in December.  Has been at the nursing home and rehab.  She was diagnosed with pneumonia during her hospitalization.  She has had somewhat of a progressive decline over the last couple months.  Chest x-ray done last visit showed chronic changes with no acute pneumonia.  Was set up for a swallow evaluation that showed normal oral and oropharyngeal function for age.  She did have some mild dysphagia commended regular solids and thin liquid.  Was felt that patient might benefit from a referral to GI to evaluate for possible esophageal dysmotility.  Patient's daughter wants to hold off on referral at this time.  Discussed if she has recurrent episodes of pneumonia may need to consider this.  Patient's daughter has several questions about her Xarelto dose.  Patient was previously on Xarelto 15 mg daily prior to admission in December.  It appears according to hospital records that her dose has been at Xarelto 50 mg twice daily.  I reviewed several hospital records and cannot find any documentation of why her dose has been increased. Patient has ongoing anemia and thrombocytopenia.  She has been referred to hematologist next week for evaluation. She has a history of DVT  diagnosed October 2016 (acute DVT involving the right soleal vein ) , on Xarelto . Previous AVR .   Allergies  Allergen Reactions  . Codeine Other (See Comments)    Pt. States it has been too long to remember the reaction   . Penicillins Other (See Comments)    Pt. Does not recall the reaction.  Has patient had a PCN reaction causing immediate rash, facial/tongue/throat swelling, SOB or lightheadedness with hypotension:  Has patient had a PCN reaction causing severe rash involving mucus membranes or skin necrosis: Has patient had a PCN reaction that required hospitalization  Has patient had a PCN reaction occurring within the last 10 years:  If all of the above answers are "NO", then may proceed with Cephalosporin use.     Immunization History  Administered Date(s) Administered  . Influenza Whole 12/24/2008, 02/25/2011  . Influenza, High Dose Seasonal PF 02/24/2006, 01/24/2017  . Influenza,inj,Quad PF,6+ Mos 12/26/2014  . Influenza-Unspecified 12/25/2013  . Pneumococcal Conjugate-13 05/08/2014  . Pneumococcal Polysaccharide-23 12/24/2008    Past Medical History:  Diagnosis Date  . Arthritis   . Blind right eye   . Cataract    RT  . CKD (chronic kidney disease)   . COPD (chronic obstructive pulmonary disease) ([place])   . Emphysema    followed by Dr. Joya Gaskins  . GERD (gastroesophageal reflux disease)   . History of Doppler echocardiogram    carotid dopplers. (8/11) no significant stenosis  .  HTN (hypertension)   . Hx of echocardiogram 2015   Echo (08/2013):  EF 60-65%, no RWMA, Gr 1 DD, AVR ok (mean 5 mmHg), mild MR, PASP 22 mmHg  . Hyperlipidemia   . Hypothyroidism   . Pulmonary embolism ([place]) 2010   She is no onger taking coumadin  . S/P aortic valve replacement with bioprosthetic valve    ehco (8/11) with EF 55-60%, mild-mod MR, mild-mod TR, poorly visualized bioprosthetic aortic valve but no significant regurgitation and gradient not significanly elevated.    . S/P  thoracic aortic aneurysm repair 1/10   also with bioprosthetic aortic valve prelacement [facility]). in 10/10 she has a descending thoraic aorta anuerysm and abdominal reapir [facility]). Vascular urgeon is Dr. Lunette Stands.   Marland Kitchen SVT (supraventricular tachycardia) (HCC)    HOLTER MONITOR AFTER SURGERY     Tobacco History: Social History   Tobacco Use  Smoking Status Former Smoker  . Packs/day: 1.00  . Years: 20.00  . Pack years: 20.00  . Types: Cigarettes  . Last attempt to quit: 04/26/2004  . Years since quitting: 13.1  Smokeless Tobacco Never Used  Tobacco Comment   quit in 2006   Counseling given: Not Answered Comment: quit in 2006   Outpatient Encounter Medications as of 07/01/2017  Medication Sig  . atorvastatin (LIPITOR) 20 MG tablet TAKE 1 TABLET BY MOUTH EVERY DAY  . donepezil (ARICEPT) 10 MG tablet TAKE 1 TABLET (10 MG TOTAL) BY MOUTH AT BEDTIME.  Marland Kitchen ibuprofen (ADVIL,MOTRIN) 200 MG tablet Take 2 tablets (400 mg total) by mouth daily as needed for moderate pain.  Marland Kitchen ipratropium-albuterol (DUONEB) 0.5-2.5 (3) MG/3ML SOLN Take 3 mLs by nebulization 2 (two) times daily. Dx: J44.9 (Patient taking differently: Take 3 mLs by nebulization every 4 (four) hours as needed (SOB). Dx: J44.9)  . L-Methylfolate-B12-B6-B2 (CEREFOLIN PO) Take 1 tablet by mouth daily.  Marland Kitchen levothyroxine (SYNTHROID, LEVOTHROID) 112 MCG tablet Take 137 mcg by mouth daily.   . memantine (NAMENDA) 10 MG tablet Take 10 mg by mouth 2 (two) times daily.   . metoprolol tartrate (LOPRESSOR) 25 MG tablet Take 0.5 tablets (12.5 mg total) by mouth 2 (two) times daily.  . mometasone-formoterol (DULERA) 100-5 MCG/ACT AERO INHALE 2 PUFFS INTO THE LUNGS 2 TIMES DAILY. RINSE MOUTH AFTER EACH USE  . Multiple Vitamin (MULITIVITAMIN WITH MINERALS) TABS Take 1 tablet by mouth daily.  Marland Kitchen oxyCODONE (OXY IR/ROXICODONE) 5 MG immediate release tablet Take 1-3 tablets (5-15 mg total) by mouth every 4 (four) hours as needed.  . pantoprazole  (PROTONIX) 40 MG tablet Take 1 tablet (40 mg total) by mouth daily at 6 (six) AM.  . PROAIR HFA 108 (90 BASE) MCG/ACT inhaler Inhale 2 puffs into the lungs every 6 (six) hours as needed. (Patient taking differently: Inhale 2 puffs into the lungs every 6 (six) hours as needed for wheezing or shortness of breath. )  . Rivaroxaban (XARELTO) 15 MG TABS tablet Take 1 tablet (15 mg total) by mouth 2 (two) times daily with a meal.  . saccharomyces boulardii (FLORASTOR) 250 MG capsule Take 1 capsule (250 mg total) by mouth 2 (two) times daily.  . sertraline (ZOLOFT) 50 MG tablet Take 50 mg by mouth at bedtime.   . thiamine 100 MG tablet Take 1 tablet (100 mg total) by mouth daily.   No facility-administered encounter medications on file as of 07/01/2017.      Review of Systems  Constitutional:   No  weight loss, night sweats,  Fevers, chills,  +fatigue, or  Lassitude. + dementia   HEENT:   No headaches,  Difficulty swallowing,  Tooth/dental problems, or  Sore throat,                No sneezing, itching, ear ache, nasal congestion, post nasal drip,   CV:  No chest pain,  Orthopnea, PND, swelling in lower extremities, anasarca, dizziness, palpitations, syncope.   GI  No heartburn, indigestion, abdominal pain, nausea, vomiting, diarrhea, change in bowel habits, loss of appetite, bloody stools.   Resp: .  No excess mucus, no productive cough,  No non-productive cough,  No coughing up of blood.  No change in color of mucus.  No wheezing.  No chest wall deformity  Skin: no rash or lesions.  GU: no dysuria, change in color of urine, no urgency or frequency.  No flank pain, no hematuria   MS:  No joint pain or swelling.  No decreased range of motion.  No back pain.    Physical Exam  BP 122/70 (BP Location: Left Arm, Cuff Size: Normal)   Pulse 76   SpO2 96%   GEN: A/Ox3; pleasant , NAD, frail and elderly in wc    HEENT:  [place]/AT,  EACs-clear, TMs-wnl, NOSE-clear, THROAT-clear, no lesions, no  postnasal drip or exudate noted.   NECK:  Supple w/ fair ROM; no JVD; normal carotid impulses w/o bruits; no thyromegaly or nodules palpated; no lymphadenopathy.    RESP decreased BS in bases  no accessory muscle use, no dullness to percussion  CARD:  RRR, no m/r/g, no peripheral edema, pulses intact, no cyanosis or clubbing.  GI:   Soft & nt; nml bowel sounds; no organomegaly or masses detected.   Musco: Warm bil, no deformities or joint swelling noted.   Neuro: alert, no focal deficits noted.    Skin: Warm, no lesions or rashes    Lab Results:  CBC    Component Value Date/Time   WBC 16.8 (H) 04/24/2017 1110   RBC 2.99 (L) 04/24/2017 1110   HGB 9.5 (L) 04/24/2017 1121   HCT 28.0 (L) 04/24/2017 1121   PLT  04/24/2017 1110    PLATELET CLUMPS NOTED ON SMEAR, COUNT APPEARS ADEQUATE   MCV 93.3 04/24/2017 1110   MCH 29.8 04/24/2017 1110   MCHC 31.9 04/24/2017 1110   RDW 17.0 (H) 04/24/2017 1110   LYMPHSABS 3.2 04/24/2017 1110   MONOABS 1.8 (H) 04/24/2017 1110   EOSABS 0.2 04/24/2017 1110   BASOSABS 0.0 04/24/2017 1110    BMET    Component Value Date/Time   NA 143 04/24/2017 1121   K 3.8 04/24/2017 1121   CL 101 04/24/2017 1121   CO2 29 04/24/2017 1110   GLUCOSE 99 04/24/2017 1121   BUN 21 (H) 04/24/2017 1121   CREATININE 1.10 (H) 04/24/2017 1121   CALCIUM 9.0 04/24/2017 1110   GFRNONAA 44 (L) 04/24/2017 1110   GFRAA 52 (L) 04/24/2017 1110    BNP    Component Value Date/Time   BNP 124.0 (H) 04/18/2017 0526    ProBNP No results found for: PROBNP  Imaging: Dg Chest 2 View  Result Date: 06/22/2017 CLINICAL DATA:  Chronic shortness of breath, fell in December 2018 with intertrochanteric fracture RIGHT femur, had postoperative pneumonia twice treated with antibiotics, history hypertension, emphysema, former smoker, pulmonary embolism EXAM: CHEST  2 VIEW COMPARISON:  04/24/2017 FINDINGS: Enlargement of cardiac silhouette post median sternotomy. Pulmonary  vascularity normal. Mild mass effect upon the LEFT lateral aspect of the  trachea at the superior mediastinum/level of the clavicular heads unchanged due to tortuous enlarged vessels on a prior CT exam. Emphysematous and bronchitic changes consistent with COPD. LEFT basilar scarring and diffuse chronic interstitial lung disease changes of the mid to lower lungs. No definite acute infiltrate or pneumothorax. Tiny pleural effusion blunts the LEFT costophrenic angle. Bones demineralized with evidence of a prior midthoracic vertebroplasty. IMPRESSION: Enlargement of cardiac silhouette post median sternotomy. Changes of COPD and chronic interstitial lung disease with scarring and tiny effusion at LEFT lung base. Electronically Signed   By: Lavonia Dana M.D.   On: 06/22/2017 09:13   Dg Op Swallowing Func-medicare/speech Path  Result Date: 06/29/2017 Objective Swallowing Evaluation: Type of Study: Bedside Swallow Evaluation  Patient Details Name: ALAZIA CROCKET MRN: [id number] Date of Birth: 17-Apr-1933 Today's Date: 06/29/2017 Time: SLP Start Time (ACUTE ONLY): 1100 -SLP Stop Time (ACUTE ONLY): 1120 SLP Time Calculation (min) (ACUTE ONLY): 20 min Past Medical History: Past Medical History: Diagnosis Date . Arthritis  . Blind right eye  . Cataract   RT . CKD (chronic kidney disease)  . COPD (chronic obstructive pulmonary disease) ([place])  . Emphysema   followed by Dr. Joya Gaskins . GERD (gastroesophageal reflux disease)  . History of Doppler echocardiogram   carotid dopplers. (8/11) no significant stenosis . HTN (hypertension)  . Hx of echocardiogram 2015  Echo (08/2013):  EF 60-65%, no RWMA, Gr 1 DD, AVR ok (mean 5 mmHg), mild MR, PASP 22 mmHg . Hyperlipidemia  . Hypothyroidism  . Pulmonary embolism ([place]) 2010  She is no onger taking coumadin . S/P aortic valve replacement with bioprosthetic valve   ehco (8/11) with EF 55-60%, mild-mod MR, mild-mod TR, poorly visualized bioprosthetic aortic valve but no significant regurgitation  and gradient not significanly elevated.   . S/P thoracic aortic aneurysm repair 1/10  also with bioprosthetic aortic valve prelacement [facility]). in 10/10 she has a descending thoraic aorta anuerysm and abdominal reapir [facility]). Vascular urgeon is Dr. Lunette Stands.  Marland Kitchen SVT (supraventricular tachycardia) (HCC)   HOLTER MONITOR AFTER SURGERY  Past Surgical History: Past Surgical History: Procedure Laterality Date . ANOMALOUS PULMONARY VENOUS RETURN REPAIR   . BACK SURGERY  1970  . EYE SURGERY    AS BABY- RT EYE  POOR SIGHT . INTRAMEDULLARY (IM) NAIL INTERTROCHANTERIC Right 04/20/2017  Procedure: INTRAMEDULLARY (IM) NAIL INTERTROCHANTRIC;  Surgeon: Leandrew Koyanagi, MD;  Location: [place];  Service: Orthopedics;  Laterality: Right; . LUMBAR LAMINECTOMY/DECOMPRESSION MICRODISCECTOMY  05/31/2011  Procedure: LUMBAR LAMINECTOMY/DECOMPRESSION MICRODISCECTOMY;  Surgeon: Elaina Hoops, MD;  Location: [place] [facility];  Service: Neurosurgery;  Laterality: Bilateral;  Bilateral Lumbar three-four decompressionj lumbar laminectomy  . THORACIC AORTIC ANEURYSM REPAIR  1/10  VALVE ALSO DONE . THORACOABDOMINAL AORTIC ANEURYSM REPAIR  01/28/09  repair . TONSILLECTOMY   HPI: Pt arrives for an outpatient MBS, referred by pulmonologist due to recurrent pneumonia to determine possibility of aspiration as a potential cause. Daughter reports no observation of dysphagia, but oral intake has been poor and pt has been more lethargic, easily fatigues during all activities. Pt also has a history of COPD.  No Data Recorded Assessment / Plan / Recommendation CHL IP CLINICAL IMPRESSIONS 06/29/2017 Clinical Impression Pt demonstrates normal oral and oropharyngeal function for age. Pt has mildly prolonged mastication of solids and slightly delayed laryngeal closure with flash penetration of thin liquids, not outside of nomral limits for age. Esophageal sweep did show diffuse residuals following minimal intake of solids. Finding would require further esophageal  work up, but could explain poor oral intake and raises concern for risk of postprandial aspiration or GER. Offered basic esophageal precautions to daughter. No diet modification needed.  SLP Visit Diagnosis Dysphagia, unspecified (R13.10) Attention and concentration deficit following -- Frontal lobe and executive function deficit following -- Impact on safety and function --   CHL IP TREATMENT RECOMMENDATION 06/29/2017 Treatment Recommendations No treatment recommended at this time   No flowsheet data found. CHL IP DIET RECOMMENDATION 06/29/2017 SLP Diet Recommendations Regular solids;Thin liquid Liquid Administration via Cup;Straw Medication Administration Whole meds with liquid Compensations Slow rate;Small sips/bites;Follow solids with liquid Postural Changes Remain semi-upright after after feeds/meals (Comment);Seated upright at 90 degrees   CHL IP OTHER RECOMMENDATIONS 06/29/2017 Recommended Consults Consider esophageal assessment;Consider GI evaluation Oral Care Recommendations -- Other Recommendations --   No flowsheet data found.  No flowsheet data found.     CHL IP ORAL PHASE 06/29/2017 Oral Phase WFL Oral - Pudding Teaspoon -- Oral - Pudding Cup -- Oral - Honey Teaspoon -- Oral - Honey Cup -- Oral - Nectar Teaspoon -- Oral - Nectar Cup -- Oral - Nectar Straw -- Oral - Thin Teaspoon -- Oral - Thin Cup -- Oral - Thin Straw -- Oral - Puree -- Oral - Mech Soft -- Oral - Regular -- Oral - Multi-Consistency -- Oral - Pill -- Oral Phase - Comment --  CHL IP PHARYNGEAL PHASE 06/29/2017 Pharyngeal Phase WFL Pharyngeal- Pudding Teaspoon -- Pharyngeal -- Pharyngeal- Pudding Cup -- Pharyngeal -- Pharyngeal- Honey Teaspoon -- Pharyngeal -- Pharyngeal- Honey Cup -- Pharyngeal -- Pharyngeal- Nectar Teaspoon -- Pharyngeal -- Pharyngeal- Nectar Cup -- Pharyngeal -- Pharyngeal- Nectar Straw -- Pharyngeal -- Pharyngeal- Thin Teaspoon -- Pharyngeal -- Pharyngeal- Thin Cup -- Pharyngeal -- Pharyngeal- Thin Straw -- Pharyngeal --  Pharyngeal- Puree -- Pharyngeal -- Pharyngeal- Mechanical Soft -- Pharyngeal -- Pharyngeal- Regular -- Pharyngeal -- Pharyngeal- Multi-consistency -- Pharyngeal -- Pharyngeal- Pill -- Pharyngeal -- Pharyngeal Comment --  No flowsheet data found. No flowsheet data found. Herbie [place], [place] CCC-SLP [phone] Lynann Beaver 06/29/2017, 1:53 PM            CLINICAL DATA:  Recurrent pneumonia.  Concern for aspiration. EXAM: MODIFIED BARIUM SWALLOW TECHNIQUE: Different consistencies of barium were administered orally to the patient by the Speech Pathologist. Imaging of the pharynx was performed in the lateral projection. FLUOROSCOPY TIME:  Fluoroscopy Time:  1 minutes and 58 seconds Radiation Exposure Index (if provided by the fluoroscopic device): NA Number of Acquired Spot Images: 0 COMPARISON:  Chest radiographs 06/21/2017.  Chest CT 04/24/2017. FINDINGS: Thin liquid-flash penetration without aspiration.  Otherwise normal. Nectar thick liquid-not administered. Honey-not administered. Pure- within normal limits Pure with cracker-mildly delayed oral transit. No penetration or aspiration. Limited visualization of the thoracic esophagus demonstrates mild dysmotility. Barium tablet-patient unable to swallow a barium tablet. IMPRESSION: Flash penetration with thin barium only. No aspiration demonstrated with any consistencies. Please refer to the Speech Pathologists report for complete details and recommendations. Electronically Signed   By: Richardean Sale M.D.   On: 06/29/2017 11:34   Xr Hip Unilat W Or W/o Pelvis 2-3 Views Right  Result Date: 06/30/2017 Stable alignment of the fracture    Assessment & Plan:   No problem-specific Assessment & Plan notes found for this encounter.     Rexene Edison, NP 07/01/2017

## 2017-07-04 DIAGNOSIS — S72001D Fracture of unspecified part of neck of right femur, subsequent encounter for closed fracture with routine healing: Secondary | ICD-10-CM | POA: Diagnosis not present

## 2017-07-04 DIAGNOSIS — R278 Other lack of coordination: Secondary | ICD-10-CM | POA: Diagnosis not present

## 2017-07-04 DIAGNOSIS — R0602 Shortness of breath: Secondary | ICD-10-CM | POA: Diagnosis not present

## 2017-07-04 DIAGNOSIS — Z4789 Encounter for other orthopedic aftercare: Secondary | ICD-10-CM | POA: Diagnosis not present

## 2017-07-04 DIAGNOSIS — R2681 Unsteadiness on feet: Secondary | ICD-10-CM | POA: Diagnosis not present

## 2017-07-04 DIAGNOSIS — J188 Other pneumonia, unspecified organism: Secondary | ICD-10-CM | POA: Diagnosis not present

## 2017-07-04 NOTE — Assessment & Plan Note (Signed)
Clinically improving - no overt aspiration on swallow evaluation .  May need GI referral however daughter declnies at this time Aspiration precautions recommended.   Plan  Patient Instructions  Severe COPD: Continue DuoNeb twice a day Continue Dulera twice a day Rinse after use.   Chronic respiratory failure with hypoxemia: Use 2 L of oxygen continuously, the goal O2 saturation should be 90-94%.   Severe deconditioning: Continue to participate in physical therapy as directed  DVT Decrease Xarelto 15mg  daily  Follow up with hematology next week as planned.    Follow up with Dr. Lake Bells in 3 months and As needed   Please contact office for sooner follow up if symptoms do not improve or worsen or seek emergency care

## 2017-07-04 NOTE — Assessment & Plan Note (Signed)
Stable without flare  Plan  Patient Instructions  Severe COPD: Continue DuoNeb twice a day Continue Dulera twice a day Rinse after use.   Chronic respiratory failure with hypoxemia: Use 2 L of oxygen continuously, the goal O2 saturation should be 90-94%.   Severe deconditioning: Continue to participate in physical therapy as directed  DVT Decrease Xarelto 15mg  daily  Follow up with hematology next week as planned.    Follow up with Dr. Lake Bells in 3 months and As needed   Please contact office for sooner follow up if symptoms do not improve or worsen or seek emergency care

## 2017-07-04 NOTE — Assessment & Plan Note (Signed)
DVT in 2016 , previous AVR. On Xarelto since then . Previous dose on xarelto 15mg  daily  Since hospitalization on xarelto 15mg  Twice daily  , unclear why dose is increased  Will decrease back to previous dosing .  She is to follow up with hematology next week as planned

## 2017-07-05 DIAGNOSIS — R278 Other lack of coordination: Secondary | ICD-10-CM | POA: Diagnosis not present

## 2017-07-05 DIAGNOSIS — R2681 Unsteadiness on feet: Secondary | ICD-10-CM | POA: Diagnosis not present

## 2017-07-05 DIAGNOSIS — Z4789 Encounter for other orthopedic aftercare: Secondary | ICD-10-CM | POA: Diagnosis not present

## 2017-07-05 DIAGNOSIS — R0602 Shortness of breath: Secondary | ICD-10-CM | POA: Diagnosis not present

## 2017-07-05 DIAGNOSIS — S72001D Fracture of unspecified part of neck of right femur, subsequent encounter for closed fracture with routine healing: Secondary | ICD-10-CM | POA: Diagnosis not present

## 2017-07-05 DIAGNOSIS — J188 Other pneumonia, unspecified organism: Secondary | ICD-10-CM | POA: Diagnosis not present

## 2017-07-06 DIAGNOSIS — R0602 Shortness of breath: Secondary | ICD-10-CM | POA: Diagnosis not present

## 2017-07-06 DIAGNOSIS — Z4789 Encounter for other orthopedic aftercare: Secondary | ICD-10-CM | POA: Diagnosis not present

## 2017-07-06 DIAGNOSIS — R2681 Unsteadiness on feet: Secondary | ICD-10-CM | POA: Diagnosis not present

## 2017-07-06 DIAGNOSIS — R278 Other lack of coordination: Secondary | ICD-10-CM | POA: Diagnosis not present

## 2017-07-06 DIAGNOSIS — S72001D Fracture of unspecified part of neck of right femur, subsequent encounter for closed fracture with routine healing: Secondary | ICD-10-CM | POA: Diagnosis not present

## 2017-07-06 DIAGNOSIS — J188 Other pneumonia, unspecified organism: Secondary | ICD-10-CM | POA: Diagnosis not present

## 2017-07-07 ENCOUNTER — Inpatient Hospital Stay: Payer: Medicare Other

## 2017-07-07 ENCOUNTER — Inpatient Hospital Stay: Payer: Medicare Other | Attending: Hematology and Oncology | Admitting: Hematology and Oncology

## 2017-07-07 ENCOUNTER — Telehealth: Payer: Self-pay

## 2017-07-07 ENCOUNTER — Encounter: Payer: Self-pay | Admitting: Hematology and Oncology

## 2017-07-07 VITALS — BP 98/57 | HR 98 | Temp 98.2°F | Resp 16 | Ht 60.0 in

## 2017-07-07 DIAGNOSIS — R278 Other lack of coordination: Secondary | ICD-10-CM | POA: Diagnosis not present

## 2017-07-07 DIAGNOSIS — Z880 Allergy status to penicillin: Secondary | ICD-10-CM | POA: Diagnosis not present

## 2017-07-07 DIAGNOSIS — J188 Other pneumonia, unspecified organism: Secondary | ICD-10-CM | POA: Diagnosis not present

## 2017-07-07 DIAGNOSIS — R5383 Other fatigue: Secondary | ICD-10-CM | POA: Diagnosis not present

## 2017-07-07 DIAGNOSIS — R2689 Other abnormalities of gait and mobility: Secondary | ICD-10-CM | POA: Diagnosis not present

## 2017-07-07 DIAGNOSIS — D696 Thrombocytopenia, unspecified: Secondary | ICD-10-CM

## 2017-07-07 DIAGNOSIS — Z885 Allergy status to narcotic agent status: Secondary | ICD-10-CM | POA: Insufficient documentation

## 2017-07-07 DIAGNOSIS — Z4789 Encounter for other orthopedic aftercare: Secondary | ICD-10-CM | POA: Diagnosis not present

## 2017-07-07 DIAGNOSIS — K219 Gastro-esophageal reflux disease without esophagitis: Secondary | ICD-10-CM | POA: Diagnosis not present

## 2017-07-07 DIAGNOSIS — G309 Alzheimer's disease, unspecified: Secondary | ICD-10-CM | POA: Diagnosis not present

## 2017-07-07 DIAGNOSIS — Z79890 Hormone replacement therapy (postmenopausal): Secondary | ICD-10-CM | POA: Insufficient documentation

## 2017-07-07 DIAGNOSIS — R0602 Shortness of breath: Secondary | ICD-10-CM | POA: Diagnosis not present

## 2017-07-07 DIAGNOSIS — F329 Major depressive disorder, single episode, unspecified: Secondary | ICD-10-CM | POA: Diagnosis not present

## 2017-07-07 DIAGNOSIS — Z953 Presence of xenogenic heart valve: Secondary | ICD-10-CM | POA: Diagnosis not present

## 2017-07-07 DIAGNOSIS — J449 Chronic obstructive pulmonary disease, unspecified: Secondary | ICD-10-CM | POA: Insufficient documentation

## 2017-07-07 DIAGNOSIS — Z87891 Personal history of nicotine dependence: Secondary | ICD-10-CM | POA: Diagnosis not present

## 2017-07-07 DIAGNOSIS — D631 Anemia in chronic kidney disease: Secondary | ICD-10-CM | POA: Diagnosis not present

## 2017-07-07 DIAGNOSIS — H5461 Unqualified visual loss, right eye, normal vision left eye: Secondary | ICD-10-CM | POA: Insufficient documentation

## 2017-07-07 DIAGNOSIS — F0280 Dementia in other diseases classified elsewhere without behavioral disturbance: Secondary | ICD-10-CM | POA: Insufficient documentation

## 2017-07-07 DIAGNOSIS — S72001D Fracture of unspecified part of neck of right femur, subsequent encounter for closed fracture with routine healing: Secondary | ICD-10-CM | POA: Diagnosis not present

## 2017-07-07 DIAGNOSIS — E039 Hypothyroidism, unspecified: Secondary | ICD-10-CM | POA: Diagnosis not present

## 2017-07-07 DIAGNOSIS — Z79899 Other long term (current) drug therapy: Secondary | ICD-10-CM | POA: Insufficient documentation

## 2017-07-07 DIAGNOSIS — Z86718 Personal history of other venous thrombosis and embolism: Secondary | ICD-10-CM | POA: Insufficient documentation

## 2017-07-07 DIAGNOSIS — E785 Hyperlipidemia, unspecified: Secondary | ICD-10-CM | POA: Insufficient documentation

## 2017-07-07 DIAGNOSIS — Z7901 Long term (current) use of anticoagulants: Secondary | ICD-10-CM | POA: Insufficient documentation

## 2017-07-07 DIAGNOSIS — N182 Chronic kidney disease, stage 2 (mild): Secondary | ICD-10-CM | POA: Diagnosis not present

## 2017-07-07 DIAGNOSIS — R2681 Unsteadiness on feet: Secondary | ICD-10-CM | POA: Diagnosis not present

## 2017-07-07 DIAGNOSIS — I129 Hypertensive chronic kidney disease with stage 1 through stage 4 chronic kidney disease, or unspecified chronic kidney disease: Secondary | ICD-10-CM | POA: Diagnosis not present

## 2017-07-07 LAB — CBC WITH DIFFERENTIAL (CANCER CENTER ONLY)
Basophils Absolute: 0 10*3/uL (ref 0.0–0.1)
Basophils Relative: 0 %
Eosinophils Absolute: 0 10*3/uL (ref 0.0–0.5)
Eosinophils Relative: 0 %
HEMATOCRIT: 38.6 % (ref 34.8–46.6)
HEMOGLOBIN: 12.7 g/dL (ref 11.6–15.9)
LYMPHS ABS: 4 10*3/uL — AB (ref 0.9–3.3)
LYMPHS PCT: 37 %
MCH: 29 pg (ref 25.1–34.0)
MCHC: 32.9 g/dL (ref 31.5–36.0)
MCV: 88.1 fL (ref 79.5–101.0)
Monocytes Absolute: 1.4 10*3/uL — ABNORMAL HIGH (ref 0.1–0.9)
Monocytes Relative: 13 %
NEUTROS PCT: 50 %
Neutro Abs: 5.2 10*3/uL (ref 1.5–6.5)
PLATELETS: 176 10*3/uL (ref 145–400)
RBC: 4.38 MIL/uL (ref 3.70–5.45)
RDW: 19.2 % — ABNORMAL HIGH (ref 11.2–14.5)
WBC: 10.6 10*3/uL — AB (ref 3.9–10.3)

## 2017-07-07 LAB — CMP (CANCER CENTER ONLY)
ALK PHOS: 89 U/L (ref 40–150)
ALT: 7 U/L (ref 0–55)
AST: 22 U/L (ref 5–34)
Albumin: 3 g/dL — ABNORMAL LOW (ref 3.5–5.0)
Anion gap: 12 — ABNORMAL HIGH (ref 3–11)
BILIRUBIN TOTAL: 0.3 mg/dL (ref 0.2–1.2)
BUN: 24 mg/dL (ref 7–26)
CALCIUM: 10.5 mg/dL — AB (ref 8.4–10.4)
CHLORIDE: 90 mmol/L — AB (ref 98–109)
CO2: 38 mmol/L — ABNORMAL HIGH (ref 22–29)
CREATININE: 2.31 mg/dL — AB (ref 0.60–1.10)
GFR, EST AFRICAN AMERICAN: 21 mL/min — AB (ref >60)
GFR, Estimated: 19 mL/min — ABNORMAL LOW (ref >60)
Glucose, Bld: 101 mg/dL (ref 70–140)
Potassium: 3.2 mmol/L — ABNORMAL LOW (ref 3.5–5.1)
Sodium: 140 mmol/L (ref 136–145)
TOTAL PROTEIN: 7.5 g/dL (ref 6.4–8.3)

## 2017-07-07 NOTE — Telephone Encounter (Signed)
Printed patient avs and calender of upcoming appointment. Per 3/14 los. Add on for lab today

## 2017-07-08 DIAGNOSIS — R2681 Unsteadiness on feet: Secondary | ICD-10-CM | POA: Diagnosis not present

## 2017-07-08 DIAGNOSIS — R0602 Shortness of breath: Secondary | ICD-10-CM | POA: Diagnosis not present

## 2017-07-08 DIAGNOSIS — Z4789 Encounter for other orthopedic aftercare: Secondary | ICD-10-CM | POA: Diagnosis not present

## 2017-07-08 DIAGNOSIS — R278 Other lack of coordination: Secondary | ICD-10-CM | POA: Diagnosis not present

## 2017-07-08 DIAGNOSIS — J188 Other pneumonia, unspecified organism: Secondary | ICD-10-CM | POA: Diagnosis not present

## 2017-07-08 DIAGNOSIS — S72001D Fracture of unspecified part of neck of right femur, subsequent encounter for closed fracture with routine healing: Secondary | ICD-10-CM | POA: Diagnosis not present

## 2017-07-08 DIAGNOSIS — E039 Hypothyroidism, unspecified: Secondary | ICD-10-CM | POA: Diagnosis not present

## 2017-07-08 LAB — LACTATE DEHYDROGENASE: LDH: 385 U/L — AB (ref 125–245)

## 2017-07-08 LAB — IRON AND TIBC
Iron: 38 ug/dL — ABNORMAL LOW (ref 41–142)
SATURATION RATIOS: 18 % — AB (ref 21–57)
TIBC: 210 ug/dL — ABNORMAL LOW (ref 236–444)
UIBC: 172 ug/dL

## 2017-07-08 LAB — FERRITIN: Ferritin: 281 ng/mL — ABNORMAL HIGH (ref 9–269)

## 2017-07-08 LAB — PATHOLOGIST SMEAR REVIEW

## 2017-07-08 LAB — HAPTOGLOBIN: HAPTOGLOBIN: 321 mg/dL — AB (ref 34–200)

## 2017-07-11 DIAGNOSIS — R278 Other lack of coordination: Secondary | ICD-10-CM | POA: Diagnosis not present

## 2017-07-11 DIAGNOSIS — R0602 Shortness of breath: Secondary | ICD-10-CM | POA: Diagnosis not present

## 2017-07-11 DIAGNOSIS — R2681 Unsteadiness on feet: Secondary | ICD-10-CM | POA: Diagnosis not present

## 2017-07-11 DIAGNOSIS — Z4789 Encounter for other orthopedic aftercare: Secondary | ICD-10-CM | POA: Diagnosis not present

## 2017-07-11 DIAGNOSIS — S72001D Fracture of unspecified part of neck of right femur, subsequent encounter for closed fracture with routine healing: Secondary | ICD-10-CM | POA: Diagnosis not present

## 2017-07-11 DIAGNOSIS — J188 Other pneumonia, unspecified organism: Secondary | ICD-10-CM | POA: Diagnosis not present

## 2017-07-12 DIAGNOSIS — J188 Other pneumonia, unspecified organism: Secondary | ICD-10-CM | POA: Diagnosis not present

## 2017-07-12 DIAGNOSIS — Z4789 Encounter for other orthopedic aftercare: Secondary | ICD-10-CM | POA: Diagnosis not present

## 2017-07-12 DIAGNOSIS — S72001D Fracture of unspecified part of neck of right femur, subsequent encounter for closed fracture with routine healing: Secondary | ICD-10-CM | POA: Diagnosis not present

## 2017-07-12 DIAGNOSIS — R0602 Shortness of breath: Secondary | ICD-10-CM | POA: Diagnosis not present

## 2017-07-12 DIAGNOSIS — R2681 Unsteadiness on feet: Secondary | ICD-10-CM | POA: Diagnosis not present

## 2017-07-12 DIAGNOSIS — R278 Other lack of coordination: Secondary | ICD-10-CM | POA: Diagnosis not present

## 2017-07-12 LAB — PROTEIN ELECTROPHORESIS, SERUM, WITH REFLEX
A/G Ratio: 0.7 (ref 0.7–1.7)
ALPHA-1-GLOBULIN: 0.4 g/dL (ref 0.0–0.4)
ALPHA-2-GLOBULIN: 1 g/dL (ref 0.4–1.0)
Albumin ELP: 2.8 g/dL — ABNORMAL LOW (ref 2.9–4.4)
Beta Globulin: 1.3 g/dL (ref 0.7–1.3)
GAMMA GLOBULIN: 1.3 g/dL (ref 0.4–1.8)
Globulin, Total: 4.2 g/dL — ABNORMAL HIGH (ref 2.2–3.9)
SPEP Interpretation: 0
TOTAL PROTEIN ELP: 7 g/dL (ref 6.0–8.5)

## 2017-07-12 LAB — IMMUNOFIXATION REFLEX, SERUM
IGG (IMMUNOGLOBIN G), SERUM: 1275 mg/dL (ref 700–1600)
IgA: 233 mg/dL (ref 64–422)
IgM (Immunoglobulin M), Srm: 43 mg/dL (ref 26–217)

## 2017-07-13 DIAGNOSIS — S72001D Fracture of unspecified part of neck of right femur, subsequent encounter for closed fracture with routine healing: Secondary | ICD-10-CM | POA: Diagnosis not present

## 2017-07-13 DIAGNOSIS — R0602 Shortness of breath: Secondary | ICD-10-CM | POA: Diagnosis not present

## 2017-07-13 DIAGNOSIS — R278 Other lack of coordination: Secondary | ICD-10-CM | POA: Diagnosis not present

## 2017-07-13 DIAGNOSIS — Z4789 Encounter for other orthopedic aftercare: Secondary | ICD-10-CM | POA: Diagnosis not present

## 2017-07-13 DIAGNOSIS — R2681 Unsteadiness on feet: Secondary | ICD-10-CM | POA: Diagnosis not present

## 2017-07-13 DIAGNOSIS — J188 Other pneumonia, unspecified organism: Secondary | ICD-10-CM | POA: Diagnosis not present

## 2017-07-14 DIAGNOSIS — R278 Other lack of coordination: Secondary | ICD-10-CM | POA: Diagnosis not present

## 2017-07-14 DIAGNOSIS — Z4789 Encounter for other orthopedic aftercare: Secondary | ICD-10-CM | POA: Diagnosis not present

## 2017-07-14 DIAGNOSIS — S72001D Fracture of unspecified part of neck of right femur, subsequent encounter for closed fracture with routine healing: Secondary | ICD-10-CM | POA: Diagnosis not present

## 2017-07-14 DIAGNOSIS — J188 Other pneumonia, unspecified organism: Secondary | ICD-10-CM | POA: Diagnosis not present

## 2017-07-14 DIAGNOSIS — R0602 Shortness of breath: Secondary | ICD-10-CM | POA: Diagnosis not present

## 2017-07-14 DIAGNOSIS — R2681 Unsteadiness on feet: Secondary | ICD-10-CM | POA: Diagnosis not present

## 2017-07-17 NOTE — Assessment & Plan Note (Signed)
84 y.o. female with multiple comorbidities referred to the clinic for thrombocytopenia evaluation.  Review of the platelet trends revealed progressive decline in the platelet count from normal numbers as recently as December 2018 followed by progressive decline with most recent value of 60,000.  Blood smears reported from December show platelet clumping which likely account for at least a portion of the perceived thrombocytopenia.  Nevertheless, additional possibilities definitely available including medication side effects, development of ITP or HIT considering patient's recent fracture of the hip and surgical repair.  Plan: -Repeat labs today as outlined below -Return to clinic in 1 week to review the findings.

## 2017-07-17 NOTE — Progress Notes (Signed)
[facility] New Visit:  Assessment: Thrombocytopenia ([place]) 84 y.o. female with multiple comorbidities referred to the clinic for thrombocytopenia evaluation.  Review of the platelet trends revealed progressive decline in the platelet count from normal numbers as recently as December 2018 followed by progressive decline with most recent value of 60,000.  Blood smears reported from December show platelet clumping which likely account for at least a portion of the perceived thrombocytopenia.  Nevertheless, additional possibilities definitely available including medication side effects, development of ITP or HIT considering patient's recent fracture of the hip and surgical repair.  Plan: -Repeat labs today as outlined below -Return to clinic in 1 week to review the findings.   Voice recognition software was used and creation of this note. Despite my best effort at editing the text, some misspelling/errors may have occurred. Orders Placed This Encounter  Procedures  . CBC with Differential (Cancer Center Only)    Standing Status:   Future    Number of Occurrences:   1    Standing Expiration Date:   07/08/2018  . CMP (National only)    Standing Status:   Future    Number of Occurrences:   1    Standing Expiration Date:   07/08/2018  . Lactate dehydrogenase (LDH)    Standing Status:   Future    Number of Occurrences:   1    Standing Expiration Date:   07/07/2018  . Pathologist smear review    Standing Status:   Future    Number of Occurrences:   1    Standing Expiration Date:   07/07/2018  . Platelet by Citrate    Standing Status:   Future    Number of Occurrences:   1    Standing Expiration Date:   07/07/2018  . SPEP with reflex to IFE    Standing Status:   Future    Number of Occurrences:   1    Standing Expiration Date:   07/07/2018  . Ferritin    Standing Status:   Future    Number of Occurrences:   1    Standing Expiration Date:   07/07/2018  . Iron and TIBC     Standing Status:   Future    Number of Occurrences:   1    Standing Expiration Date:   07/07/2018  . Haptoglobin    Standing Status:   Future    Number of Occurrences:   1    Standing Expiration Date:   07/07/2018    All questions were answered.  . The patient knows to call the clinic with any problems, questions or concerns.  This note was electronically signed.    History of Presenting Illness Sarah Johnson 84 y.o. presenting to the [facility] for progressive thrombocytopenia, referred by Dr Leanna Battles.  Patient's past medical history significant for hypertension, COPD, hypothyroidism, major depression, GERD, history of deep vein thrombosis in October 2017, currently undergoing therapeutic anticoagulation with rivaroxaban, also an intertrochanteric fracture of the hip in December 2018 which was treated with surgical repair, as well as Alzheimer's disease.  Patient currently resides in skilled nursing facility.  At the present time, patient is unable to provide her own history or answer questions regarding significant symptoms.  She seems to deny significant pain, fevers, chills, or easy bruisability.  Oncological/hematological History: --Labs, 12/01/15: WBC 12.6,     Hgb 11.0              Plt 266 --  Labs, 04/17/17: WBC 22.3,     Hgb 13.3,               Plt 148 --Labs, 04/19/17: WBC 17.6,                 Hgb 11.8, MCV 94.8, MCH 29.5, MCHC 31.1, RDW 16.9, Plt   93 -- platelet clumping reported --Labs, 06/14/17: WBC 11.4, ANC 5.5, ALC 4.9, Mono 0.9,  Hgb 10.0, MCV 91.6, MCH 28.6, MCHC 31.4, RDW 17.0, Plt   75, MPV 16.6 --Labs, 06/23/17: WBC   7.8, ANC 2.8, ALC 3.8, Mono 0.9,  Hgb 10.0, MCV 88.2, MCH 26.6, MCHC 32.4, RDW 16.9, Plt   60, MPV 11.1  Medical History: Past Medical History:  Diagnosis Date  . Arthritis   . Blind right eye   . Cataract    RT  . CKD (chronic kidney disease)   . COPD (chronic obstructive pulmonary disease) ([place])   . Emphysema    followed by Dr.  Joya Gaskins  . GERD (gastroesophageal reflux disease)   . History of Doppler echocardiogram    carotid dopplers. (8/11) no significant stenosis  . HTN (hypertension)   . Hx of echocardiogram 2015   Echo (08/2013):  EF 60-65%, no RWMA, Gr 1 DD, AVR ok (mean 5 mmHg), mild MR, PASP 22 mmHg  . Hyperlipidemia   . Hypothyroidism   . Pulmonary embolism ([place]) 2010   She is no onger taking coumadin  . S/P aortic valve replacement with bioprosthetic valve    ehco (8/11) with EF 55-60%, mild-mod MR, mild-mod TR, poorly visualized bioprosthetic aortic valve but no significant regurgitation and gradient not significanly elevated.    . S/P thoracic aortic aneurysm repair 1/10   also with bioprosthetic aortic valve prelacement [facility]). in 10/10 she has a descending thoraic aorta anuerysm and abdominal reapir [facility]). Vascular urgeon is Dr. Lunette Stands.   Marland Kitchen SVT (supraventricular tachycardia) ([place])    HOLTER MONITOR AFTER SURGERY     Surgical History: Past Surgical History:  Procedure Laterality Date  . ANOMALOUS PULMONARY VENOUS RETURN REPAIR    . BACK SURGERY  1970   . EYE SURGERY     AS BABY- RT EYE  POOR SIGHT  . INTRAMEDULLARY (IM) NAIL INTERTROCHANTERIC Right 04/20/2017   Procedure: INTRAMEDULLARY (IM) NAIL INTERTROCHANTRIC;  Surgeon: Leandrew Koyanagi, MD;  Location: [facility];  Service: Orthopedics;  Laterality: Right;  . LUMBAR LAMINECTOMY/DECOMPRESSION MICRODISCECTOMY  05/31/2011   Procedure: LUMBAR LAMINECTOMY/DECOMPRESSION MICRODISCECTOMY;  Surgeon: Elaina Hoops, MD;  Location: [facility];  Service: Neurosurgery;  Laterality: Bilateral;  Bilateral Lumbar three-four decompressionj lumbar laminectomy   . THORACIC AORTIC ANEURYSM REPAIR  1/10   VALVE ALSO DONE  . THORACOABDOMINAL AORTIC ANEURYSM REPAIR  01/28/09   repair  . TONSILLECTOMY      Family History: Family History  Problem Relation Age of Onset  . Brain cancer Maternal Grandmother   . Coronary artery disease Unknown        no  premature CAD    Social History: Social History   Socioeconomic History  . Marital status: Married    Spouse name: Not on file  . Number of children: 3  . Years of education: Not on file  . Highest education level: Not on file  Occupational History  . Occupation: Retired  Scientific laboratory technician  . Financial resource strain: Not on file  . Food insecurity:    Worry: Not on file    Inability: Not on file  .  Transportation needs:    Medical: Not on file    Non-medical: Not on file  Tobacco Use  . Smoking status: Former Smoker    Packs/day: 1.00    Years: 20.00    Pack years: 20.00    Types: Cigarettes    Last attempt to quit: 04/26/2004    Years since quitting: 13.2  . Smokeless tobacco: Never Used  . Tobacco comment: quit in 2006  Substance and Sexual Activity  . Alcohol use: No  . Drug use: No  . Sexual activity: Yes    Birth control/protection: Post-menopausal  Lifestyle  . Physical activity:    Days per week: Not on file    Minutes per session: Not on file  . Stress: Not on file  Relationships  . Social connections:    Talks on phone: Not on file    Gets together: Not on file    Attends religious service: Not on file    Active member of club or organization: Not on file    Attends meetings of clubs or organizations: Not on file    Relationship status: Not on file  . Intimate partner violence:    Fear of current or ex partner: Not on file    Emotionally abused: Not on file    Physically abused: Not on file    Forced sexual activity: Not on file  Other Topics Concern  . 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 Codeine Other (See Comments)    Pt. States it has been too long to remember the reaction   . Penicillins Other (See Comments)    Pt. Does not recall the reaction.  Has patient had a PCN reaction causing  immediate rash, facial/tongue/throat swelling, SOB or lightheadedness with hypotension:  Has patient had a PCN reaction causing severe rash involving mucus membranes or skin necrosis: Has patient had a PCN reaction that required hospitalization  Has patient had a PCN reaction occurring within the last 10 years:  If all of the above answers are "NO", then may proceed with Cephalosporin use.     Medications:  Current Outpatient Medications  Medication Sig Dispense Refill  . atorvastatin (LIPITOR) 20 MG tablet TAKE 1 TABLET BY MOUTH EVERY DAY 30 tablet 3  . divalproex (DEPAKOTE) 125 MG DR tablet Take 125 mg by mouth 2 (two) times daily.    Marland Kitchen donepezil (ARICEPT) 10 MG tablet TAKE 1 TABLET (10 MG TOTAL) BY MOUTH AT BEDTIME. 30 tablet 0  . furosemide (LASIX) 40 MG tablet Take 40 mg by mouth 2 (two) times daily.    Marland Kitchen ibuprofen (ADVIL,MOTRIN) 200 MG tablet Take 2 tablets (400 mg total) by mouth daily as needed for moderate pain. 30 tablet 0  . ipratropium-albuterol (DUONEB) 0.5-2.5 (3) MG/3ML SOLN Take 3 mLs by nebulization 2 (two) times daily. Dx: J44.9 (Patient taking differently: Take 3 mLs by nebulization every 4 (four) hours as needed (SOB). Dx: J44.9) 180 mL 5  . L-Methylfolate-B12-B6-B2 (CEREFOLIN PO) Take 1 tablet by mouth daily.    Marland Kitchen levothyroxine (SYNTHROID, LEVOTHROID) 137 MCG tablet Take 137 mcg by mouth daily.   12  . memantine (NAMENDA) 10 MG tablet Take 10 mg by mouth 2 (two) times daily.   1  . metoprolol tartrate (LOPRESSOR) 25 MG tablet Take 0.5 tablets (12.5 mg total) by mouth 2 (  two) times daily. 60 tablet 0  . mometasone-formoterol (DULERA) 100-5 MCG/ACT AERO INHALE 2 PUFFS INTO THE LUNGS 2 TIMES DAILY. RINSE MOUTH AFTER EACH USE 1 Inhaler 0  . Multiple Vitamin (MULITIVITAMIN WITH MINERALS) TABS Take 1 tablet by mouth daily.    Marland Kitchen oxyCODONE (OXY IR/ROXICODONE) 5 MG immediate release tablet Take 1-3 tablets (5-15 mg total) by mouth every 4 (four) hours as needed. 30 tablet 0  .  pantoprazole (PROTONIX) 40 MG tablet Take 1 tablet (40 mg total) by mouth daily at 6 (six) AM. 30 tablet 0  . PROAIR HFA 108 (90 BASE) MCG/ACT inhaler Inhale 2 puffs into the lungs every 6 (six) hours as needed. (Patient taking differently: Inhale 2 puffs into the lungs every 6 (six) hours as needed for wheezing or shortness of breath. ) 16 g 11  . Rivaroxaban (XARELTO) 15 MG TABS tablet Take 15 mg by mouth daily with supper.    . saccharomyces boulardii (FLORASTOR) 250 MG capsule Take 1 capsule (250 mg total) by mouth 2 (two) times daily. 60 capsule 0  . sertraline (ZOLOFT) 50 MG tablet Take 50 mg by mouth at bedtime.     . thiamine 100 MG tablet Take 1 tablet (100 mg total) by mouth daily. 30 tablet 0   No current facility-administered medications for this visit.     Review of Systems: Review of Systems  All other systems reviewed and are negative.    PHYSICAL EXAMINATION Blood pressure (!) 98/57, pulse 98, temperature 98.2 F (36.8 C), temperature source Oral, resp. rate 16, height 5' (1.524 m), SpO2 98 %.  ECOG PERFORMANCE STATUS: 3 - Symptomatic, >50% confined to bed  Physical Exam  Constitutional: No distress.  Frail elderly female  HENT:  Mouth/Throat: Oropharynx is clear and moist. No oropharyngeal exudate.  Neck: No thyromegaly present.  Cardiovascular: Normal rate, regular rhythm and normal heart sounds.  No murmur heard. Pulmonary/Chest: Effort normal and breath sounds normal. No respiratory distress. She has no wheezes. She has no rales.  Abdominal: Soft. Bowel sounds are normal. She exhibits no distension. There is no tenderness. There is no rebound.  Musculoskeletal: She exhibits no edema.  Lymphadenopathy:    She has no cervical adenopathy.  Neurological: She is alert. She has normal reflexes. No cranial nerve deficit.  Skin: Skin is warm and dry. No rash noted. No erythema.     LABORATORY DATA: I have personally reviewed the data as listed: Clinical Support on  07/07/2017  Component Date Value Ref Range Status  . Haptoglobin 07/07/2017 321* 34 - 200 mg/dL Final   Comment: (NOTE) Performed At: [facility] [place], [place] [id number] Rush Farmer MD [id number] Performed at [facility], [place] [address], [place], [place] [address]   . Iron 07/07/2017 38* 41 - 142 ug/dL Final  . TIBC 07/07/2017 210* 236 - 444 ug/dL Final  . Saturation Ratios 07/07/2017 18* 21 - 57 % Final  . UIBC 07/07/2017 172  ug/dL Final   Performed at [facility], [place] [address], [place], [place] [address]  . Ferritin 07/07/2017 281* 9 - 269 ng/mL Final   Performed at [facility], [place] [address], [place], [place] [address]  . Total Protein ELP 07/07/2017 7.0  6.0 - 8.5 g/dL Final  . Albumin ELP 07/07/2017 2.8* 2.9 - 4.4 g/dL Final  . Alpha-1-Globulin 07/07/2017 0.4  0.0 - 0.4 g/dL Final  . Alpha-2-Globulin 07/07/2017 1.0  0.4 -  1.0 g/dL Final  . Beta Globulin 07/07/2017 1.3  0.7 - 1.3 g/dL Final  . Gamma Globulin 07/07/2017 1.3  0.4 - 1.8 g/dL Final  . M-Spike, % 07/07/2017 Not Observed  Not Observed g/dL Final  . GLOBULIN, TOTAL 07/07/2017 4.2* 2.2 - 3.9 g/dL Corrected  . A/G Ratio 07/07/2017 0.7  0.7 - 1.7 Corrected  . Comment 07/07/2017 Comment   Corrected   Comment: (NOTE) Protein electrophoresis scan will follow via computer, mail, or courier delivery.   Marland Kitchen SPEP Interpretation 07/07/2017 .   Final   Comment: (NOTE) Performed At: [facility] [place], [place] [id number] Rush Farmer MD [id number] Performed at [facility], [place] [address] [place], [place] [address]   . Path Review 07/07/2017 Reviewed By Violet Baldy, M.D.   Final   Comment: Abnormal RBC morphology including abundant  target cells,scattered micro spherocytes. leukocytosis, lymphocytosis, monocytosis. if lymphocytosis is a persistent  finding immunophenotyping is recommended. 03.15.19 Performed at [facility], [place] [address] [place], [place] [address]   . LDH 07/07/2017 385* 125 - 245 U/L Final   Performed at [facility], [place] [address], [place], [place] [address]  . Sodium 07/07/2017 140  136 - 145 mmol/L Final  . Potassium 07/07/2017 3.2* 3.5 - 5.1 mmol/L Final  . Chloride 07/07/2017 90* 98 - 109 mmol/L Final  . CO2 07/07/2017 38* 22 - 29 mmol/L Final  . Glucose, Bld 07/07/2017 101  70 - 140 mg/dL Final  . BUN 07/07/2017 24  7 - 26 mg/dL Final  . Creatinine 07/07/2017 2.31* 0.60 - 1.10 mg/dL Final  . Calcium 07/07/2017 10.5* 8.4 - 10.4 mg/dL Final  . Total Protein 07/07/2017 7.5  6.4 - 8.3 g/dL Final  . Albumin 07/07/2017 3.0* 3.5 - 5.0 g/dL Final  . AST 07/07/2017 22  5 - 34 U/L Final  . ALT 07/07/2017 7  0 - 55 U/L Final  . Alkaline Phosphatase 07/07/2017 89  40 - 150 U/L Final  . Total Bilirubin 07/07/2017 0.3  0.2 - 1.2 mg/dL Final  . GFR, Est Non Af Am 07/07/2017 19* >60 mL/min Final  . GFR, Est AFR Am 07/07/2017 21* >60 mL/min Final   Comment: (NOTE) The eGFR has been calculated using the CKD EPI equation. This calculation has not been validated in all clinical situations. eGFR's persistently <60 mL/min signify possible Chronic Kidney Disease.   Georgiann Hahn gap 07/07/2017 12* 3 - 11 Final   Performed at [facility], [place] [address], [place], [place] [address]  . WBC Count 07/07/2017 10.6* 3.9 - 10.3 K/uL Final  . RBC 07/07/2017 4.38  3.70 - 5.45 MIL/uL Final  . Hemoglobin 07/07/2017 12.7  11.6 - 15.9 g/dL Final  . HCT 07/07/2017 38.6  34.8 - 46.6 % Final  . MCV 07/07/2017 88.1  79.5 - 101.0 fL Final  . MCH 07/07/2017 29.0  25.1 - 34.0 pg Final  . MCHC 07/07/2017 32.9  31.5 - 36.0 g/dL Final  . RDW 07/07/2017 19.2* 11.2 - 14.5 % Final  . Platelet Count 07/07/2017 176  145 - 400 K/uL Final  . Neutrophils Relative % 07/07/2017 50  %  Final  . Neutro Abs 07/07/2017 5.2  1.5 - 6.5 K/uL Final  . Lymphocytes Relative 07/07/2017 37  % Final  . Lymphs Abs 07/07/2017 4.0* 0.9 - 3.3 K/uL Final  . Monocytes Relative 07/07/2017 13  % Final  . Monocytes Absolute 07/07/2017  1.4* 0.1 - 0.9 K/uL Final  . Eosinophils Relative 07/07/2017 0  % Final  . Eosinophils Absolute 07/07/2017 0.0  0.0 - 0.5 K/uL Final  . Basophils Relative 07/07/2017 0  % Final  . Basophils Absolute 07/07/2017 0.0  0.0 - 0.1 K/uL Final  . Smear Review 07/07/2017 Large and giant platlets, few ovalocytes, teardrop and schistocytes seen   Final   Performed at [facility], [place] [address], [place], [place] [address]  . IgG (Immunoglobin G), Serum 07/07/2017 1,275  700 - 1,600 mg/dL Final  . IgA 07/07/2017 233  64 - 422 mg/dL Final  . IgM (Immunoglobulin M), Srm 07/07/2017 43  26 - 217 mg/dL Final  . IFE 1 07/07/2017 Comment   Final   Comment: (NOTE) An apparent normal immunofixation pattern. Performed At: [facility] [place], [place] [id number] Rush Farmer MD [id number] Performed at [facility], [place] [address], [place], [place] [address]          Ardath Sax, MD

## 2017-07-18 ENCOUNTER — Encounter: Payer: Self-pay | Admitting: Hematology and Oncology

## 2017-07-18 ENCOUNTER — Inpatient Hospital Stay: Payer: Medicare Other | Admitting: Hematology and Oncology

## 2017-07-18 DIAGNOSIS — D631 Anemia in chronic kidney disease: Secondary | ICD-10-CM

## 2017-07-18 DIAGNOSIS — D696 Thrombocytopenia, unspecified: Secondary | ICD-10-CM

## 2017-07-18 DIAGNOSIS — N182 Chronic kidney disease, stage 2 (mild): Secondary | ICD-10-CM

## 2017-07-19 ENCOUNTER — Telehealth: Payer: Self-pay | Admitting: Hematology and Oncology

## 2017-07-19 NOTE — Telephone Encounter (Signed)
Return if symptoms worsen or fail to improve per 3/25 los

## 2017-07-24 NOTE — Assessment & Plan Note (Signed)
84 y.o. female with multiple comorbidities referred to the clinic for thrombocytopenia evaluation.  Review of the platelet trends revealed progressive decline in the platelet count from normal numbers as recently as Dec 2018 followed by progressive decline with most recent value of 60,000.  Blood smears reported from December show platelet clumping which likely account for at least a portion of the perceived thrombocytopenia.  Nevertheless, additional possibilities definitely available including medication side effects, development of ITP or HIT considering patient's recent fracture of the hip and surgical repair.  Assessment obtained at last visit to the clinic demonstrates interval recovery of the platelet count to normal range and significant improvement in hemoglobin values.  The only significant abnormalities found in the lab work were elevation of calcium and creatinine.  The latter one is a chronic condition, the calcium elevation is mild and most likely related to decreased mobility and recent fracture without evidence of other on going process such as multiple myeloma.  Plan: -At this time, I do not see any benefit in additional hematological workup, nor does the patient's family desire to subject her to further assessment such as bone marrow biopsy. -Return to our clinic on as-needed basis.  Recommend primary care provider to check complete blood count with differential at least every 6 months.

## 2017-07-24 NOTE — Progress Notes (Signed)
[facility] Follow-up Visit:  Assessment: Anemia of chronic renal failure, stage 2 (mild) 84 y.o. female with multiple comorbidities referred to the clinic for thrombocytopenia evaluation.  Review of the platelet trends revealed progressive decline in the platelet count from normal numbers as recently as Dec 2018 followed by progressive decline with most recent value of 60,000.  Blood smears reported from December show platelet clumping which likely account for at least a portion of the perceived thrombocytopenia.  Nevertheless, additional possibilities definitely available including medication side effects, development of ITP or HIT considering patient's recent fracture of the hip and surgical repair.  Assessment obtained at last visit to the clinic demonstrates interval recovery of the platelet count to normal range and significant improvement in hemoglobin values.  The only significant abnormalities found in the lab work were elevation of calcium and creatinine.  The latter one is a chronic condition, the calcium elevation is mild and most likely related to decreased mobility and recent fracture without evidence of other on going process such as multiple myeloma.  Plan: -At this time, I do not see any benefit in additional hematological workup, nor does the patient's family desire to subject her to further assessment such as bone marrow biopsy. -Return to our clinic on as-needed basis.  Recommend primary care provider to check complete blood count with differential at least every 6 months.   Voice recognition software was used and creation of this note. Despite my best effort at editing the text, some misspelling/errors may have occurred.  No orders of the defined types were placed in this encounter.   Cancer Staging No matching staging information was found for the patient.  All questions were answered. . The patient knows to call the clinic with any problems,  questions or concerns.  This note was electronically signed.    History of Presenting Illness Sarah Johnson is an 84 y.o. followed in the [facility] for progressive thrombocytopenia, referred by Dr Leanna Battles.  Patient's past medical history significant for hypertension, COPD, hypothyroidism, major depression, GERD, history of deep vein thrombosis in October 2017, currently undergoing therapeutic anticoagulation with rivaroxaban, also an intertrochanteric fracture of the hip in December 2018 which was treated with surgical repair, as well as Alzheimer's disease.  Patient currently resides in skilled nursing facility.  At the present time, patient is unable to provide her own history or answer questions regarding significant symptoms.  She seems to deny significant pain, fevers, chills, or easy bruisability.  Oncological/hematological History: --Labs, 12/01/15: WBC 12.6,                                                Hgb 11.0                                                                             Plt 266 --Labs, 04/17/17: WBC 22.3,  Hgb 13.3,                                                                            Plt 148 --Labs, 04/19/17: WBC 17.6,                                                Hgb 11.8, MCV 94.8, MCH 29.5, MCHC 31.1, RDW 16.9, Plt   93 -- platelet clumping reported --Labs, 06/14/17: WBC 11.4, ANC 5.5, ALC 4.9, Mono 0.9, Hgb 10.0, MCV 91.6, MCH 28.6, MCHC 31.4, RDW 17.0, Plt   75, MPV 16.6 --Labs, 06/23/17: WBC   7.8, ANC 2.8, ALC 3.8, Mono 0.9, Hgb 10.0, MCV 88.2, MCH 26.6, MCHC 32.4, RDW 16.9, Plt   60, MPV 11.1 --Labs, 07/07/17: WBC 10.6, ANC 5.2, ALC 4.0, Mono 1.4, Hgb 12.7,             Plt 176; Fe 38, FeSat 18%, TIBC 210, Ferritin 281; tProt 7.5, Alb 3.0, Ca 10.5, Cr 2.3, AP 89, LDH 385; SPEP -- negative for M-Spike; IgG 1275, IgA 283, IgM 43; --pBlood Smear, 07/07/17:large and giant platelet forms present along  with schistocytes and ovalocytes.   No history exists.    Medical History: Past Medical History:  Diagnosis Date  . Arthritis   . Blind right eye   . Cataract    RT  . CKD (chronic kidney disease)   . COPD (chronic obstructive pulmonary disease) ([place])   . Emphysema    followed by Dr. Joya Gaskins  . GERD (gastroesophageal reflux disease)   . History of Doppler echocardiogram    carotid dopplers. (8/11) no significant stenosis  . HTN (hypertension)   . Hx of echocardiogram 2015   Echo (08/2013):  EF 60-65%, no RWMA, Gr 1 DD, AVR ok (mean 5 mmHg), mild MR, PASP 22 mmHg  . Hyperlipidemia   . Hypothyroidism   . Pulmonary embolism ([place]) 2010   She is no onger taking coumadin  . S/P aortic valve replacement with bioprosthetic valve    ehco (8/11) with EF 55-60%, mild-mod MR, mild-mod TR, poorly visualized bioprosthetic aortic valve but no significant regurgitation and gradient not significanly elevated.    . S/P thoracic aortic aneurysm repair 1/10   also with bioprosthetic aortic valve prelacement [facility]). in 10/10 she has a descending thoraic aorta anuerysm and abdominal reapir [facility]). Vascular urgeon is Dr. Lunette Stands.   Marland Kitchen SVT (supraventricular tachycardia) ([place])    HOLTER MONITOR AFTER SURGERY     Surgical History: Past Surgical History:  Procedure Laterality Date  . ANOMALOUS PULMONARY VENOUS RETURN REPAIR    . BACK SURGERY  1970   . EYE SURGERY     AS BABY- RT EYE  POOR SIGHT  . INTRAMEDULLARY (IM) NAIL INTERTROCHANTERIC Right 04/20/2017   Procedure: INTRAMEDULLARY (IM) NAIL INTERTROCHANTRIC;  Surgeon: Leandrew Koyanagi, MD;  Location: [facility];  Service: Orthopedics;  Laterality: Right;  . LUMBAR LAMINECTOMY/DECOMPRESSION MICRODISCECTOMY  05/31/2011   Procedure: LUMBAR LAMINECTOMY/DECOMPRESSION MICRODISCECTOMY;  Surgeon: Elaina Hoops, MD;  Location: [facility]UR[facility]S;  Service: Neurosurgery;  Laterality: Bilateral;  Bilateral Lumbar three-four decompressionj lumbar laminectomy   .  THORACIC AORTIC ANEURYSM REPAIR  1/10   VALVE ALSO DONE  . THORACOABDOMINAL AORTIC ANEURYSM REPAIR  01/28/09   repair  . TONSILLECTOMY      Family History: Family History  Problem Relation Age of Onset  . Brain cancer Maternal Grandmother   . Coronary artery disease Unknown        no premature CAD    Social History: Social History   Socioeconomic History  . Marital status: Married    Spouse name: Not on file  . Number of children: 3  . Years of education: Not on file  . Highest education level: Not on file  Occupational History  . Occupation: Retired  Scientific laboratory technician  . Financial resource strain: Not on file  . Food insecurity:    Worry: Not on file    Inability: Not on file  . Transportation needs:    Medical: Not on file    Non-medical: Not on file  Tobacco Use  . Smoking status: Former Smoker    Packs/day: 1.00    Years: 20.00    Pack years: 20.00    Types: Cigarettes    Last attempt to quit: 04/26/2004    Years since quitting: 13.2  . Smokeless tobacco: Never Used  . Tobacco comment: quit in 2006  Substance and Sexual Activity  . Alcohol use: No  . Drug use: No  . Sexual activity: Yes    Birth control/protection: Post-menopausal  Lifestyle  . Physical activity:    Days per week: Not on file    Minutes per session: Not on file  . Stress: Not on file  Relationships  . Social connections:    Talks on phone: Not on file    Gets together: Not on file    Attends religious service: Not on file    Active member of club or organization: Not on file    Attends meetings of clubs or organizations: Not on file    Relationship status: Not on file  . Intimate partner violence:    Fear of current or ex partner: Not on file    Emotionally abused: Not on file    Physically abused: Not on file    Forced sexual activity: Not on file  Other Topics Concern  . 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 Codeine Other (See Comments)    Pt. States it has been too long to remember the reaction   . Penicillins Other (See Comments)    Pt. Does not recall the reaction.  Has patient had a PCN reaction causing immediate rash, facial/tongue/throat swelling, SOB or lightheadedness with hypotension:  Has patient had a PCN reaction causing severe rash involving mucus membranes or skin necrosis: Has patient had a PCN reaction that required hospitalization  Has patient had a PCN reaction occurring within the last 10 years:  If all of the above answers are "NO", then may proceed with Cephalosporin use.     Medications:  Current Outpatient Medications  Medication Sig Dispense Refill  . atorvastatin (LIPITOR) 20 MG tablet TAKE 1 TABLET BY MOUTH EVERY DAY 30 tablet 3  . divalproex (DEPAKOTE) 125 MG DR tablet Take 125 mg by mouth 2 (two) times daily.    Marland Kitchen donepezil (ARICEPT) 10 MG tablet  TAKE 1 TABLET (10 MG TOTAL) BY MOUTH AT BEDTIME. 30 tablet 0  . furosemide (LASIX) 40 MG tablet Take 40 mg by mouth 2 (two) times daily.    Marland Kitchen ibuprofen (ADVIL,MOTRIN) 200 MG tablet Take 2 tablets (400 mg total) by mouth daily as needed for moderate pain. 30 tablet 0  . ipratropium-albuterol (DUONEB) 0.5-2.5 (3) MG/3ML SOLN Take 3 mLs by nebulization 2 (two) times daily. Dx: J44.9 (Patient taking differently: Take 3 mLs by nebulization every 4 (four) hours as needed (SOB). Dx: J44.9) 180 mL 5  . L-Methylfolate-B12-B6-B2 (CEREFOLIN PO) Take 1 tablet by mouth daily.    Marland Kitchen levothyroxine (SYNTHROID, LEVOTHROID) 137 MCG tablet Take 137 mcg by mouth daily.   12  . memantine (NAMENDA) 10 MG tablet Take 10 mg by mouth 2 (two) times daily.   1  . metoprolol tartrate (LOPRESSOR) 25 MG tablet Take 0.5 tablets (12.5 mg total) by mouth 2 (two) times daily. 60 tablet 0  . mometasone-formoterol (DULERA) 100-5 MCG/ACT AERO INHALE 2 PUFFS INTO THE LUNGS 2  TIMES DAILY. RINSE MOUTH AFTER EACH USE 1 Inhaler 0  . Multiple Vitamin (MULITIVITAMIN WITH MINERALS) TABS Take 1 tablet by mouth daily.    Marland Kitchen oxyCODONE (OXY IR/ROXICODONE) 5 MG immediate release tablet Take 1-3 tablets (5-15 mg total) by mouth every 4 (four) hours as needed. 30 tablet 0  . pantoprazole (PROTONIX) 40 MG tablet Take 1 tablet (40 mg total) by mouth daily at 6 (six) AM. 30 tablet 0  . PROAIR HFA 108 (90 BASE) MCG/ACT inhaler Inhale 2 puffs into the lungs every 6 (six) hours as needed. (Patient taking differently: Inhale 2 puffs into the lungs every 6 (six) hours as needed for wheezing or shortness of breath. ) 16 g 11  . Rivaroxaban (XARELTO) 15 MG TABS tablet Take 15 mg by mouth daily with supper.    . saccharomyces boulardii (FLORASTOR) 250 MG capsule Take 1 capsule (250 mg total) by mouth 2 (two) times daily. 60 capsule 0  . sertraline (ZOLOFT) 50 MG tablet Take 50 mg by mouth at bedtime.     . thiamine 100 MG tablet Take 1 tablet (100 mg total) by mouth daily. 30 tablet 0   No current facility-administered medications for this visit.     Review of Systems: Review of Systems  All other systems reviewed and are negative.    PHYSICAL EXAMINATION There were no vitals taken for this visit.  ECOG PERFORMANCE STATUS: 0 - Asymptomatic  Physical Exam  Constitutional: No distress.  Frail elderly female  HENT:  Mouth/Throat: Oropharynx is clear and moist. No oropharyngeal exudate.  Neck: No thyromegaly present.  Cardiovascular: Normal rate, regular rhythm and normal heart sounds.  No murmur heard. Pulmonary/Chest: Effort normal and breath sounds normal. No respiratory distress. She has no wheezes. She has no rales.  Abdominal: Soft. Bowel sounds are normal. She exhibits no distension. There is no tenderness. There is no rebound.  Musculoskeletal: She exhibits no edema.  Lymphadenopathy:    She has no cervical adenopathy.  Neurological: She is alert. She has normal reflexes.  No cranial nerve deficit.  Skin: Skin is warm and dry. No rash noted. No erythema.     LABORATORY DATA: I have personally reviewed the data as listed: No visits with results within 1 Week(s) from this visit.  Latest known visit with results is:  Clinical Support on 07/07/2017  Component Date Value Ref Range Status  . Haptoglobin 07/07/2017 321* 34 - 200 mg/dL Final  Comment: (NOTE) Performed At: [facility] [place], [place] [id number] Rush Farmer MD [id number] Performed at [facility], [place] [address] [place], [place] [address]   . Iron 07/07/2017 38* 41 - 142 ug/dL Final  . TIBC 07/07/2017 210* 236 - 444 ug/dL Final  . Saturation Ratios 07/07/2017 18* 21 - 57 % Final  . UIBC 07/07/2017 172  ug/dL Final   Performed at [facility], [place] [address], [place], [place] [address]  . Ferritin 07/07/2017 281* 9 - 269 ng/mL Final   Performed at [facility], [place] [address] [place], [place] [address]  . Total Protein ELP 07/07/2017 7.0  6.0 - 8.5 g/dL Final  . Albumin ELP 07/07/2017 2.8* 2.9 - 4.4 g/dL Final  . Alpha-1-Globulin 07/07/2017 0.4  0.0 - 0.4 g/dL Final  . Alpha-2-Globulin 07/07/2017 1.0  0.4 - 1.0 g/dL Final  . Beta Globulin 07/07/2017 1.3  0.7 - 1.3 g/dL Final  . Gamma Globulin 07/07/2017 1.3  0.4 - 1.8 g/dL Final  . M-Spike, % 07/07/2017 Not Observed  Not Observed g/dL Final  . GLOBULIN, TOTAL 07/07/2017 4.2* 2.2 - 3.9 g/dL Corrected  . A/G Ratio 07/07/2017 0.7  0.7 - 1.7 Corrected  . Comment 07/07/2017 Comment   Corrected   Comment: (NOTE) Protein electrophoresis scan will follow via computer, mail, or courier delivery.   Marland Kitchen SPEP Interpretation 07/07/2017 .   Final   Comment: (NOTE) Performed At: [facility] [place], [place] [id number] Rush Farmer MD KZ:[id number] Performed at [facility], [place]  [address] [place], [place] [address]   . Path Review 07/07/2017 Reviewed By Violet Baldy, M.D.   Final   Comment: Abnormal RBC morphology including abundant  target cells,scattered micro spherocytes. leukocytosis, lymphocytosis, monocytosis. if lymphocytosis is a persistent finding immunophenotyping is recommended. 03.15.19 Performed at [facility], [place] [address] [place], [place] [address]   . LDH 07/07/2017 385* 125 - 245 U/L Final   Performed at [facility], [place] [address] [place], [place] [address]  . Sodium 07/07/2017 140  136 - 145 mmol/L Final  . Potassium 07/07/2017 3.2* 3.5 - 5.1 mmol/L Final  . Chloride 07/07/2017 90* 98 - 109 mmol/L Final  . CO2 07/07/2017 38* 22 - 29 mmol/L Final  . Glucose, Bld 07/07/2017 101  70 - 140 mg/dL Final  . BUN 07/07/2017 24  7 - 26 mg/dL Final  . Creatinine 07/07/2017 2.31* 0.60 - 1.10 mg/dL Final  . Calcium 07/07/2017 10.5* 8.4 - 10.4 mg/dL Final  . Total Protein 07/07/2017 7.5  6.4 - 8.3 g/dL Final  . Albumin 07/07/2017 3.0* 3.5 - 5.0 g/dL Final  . AST 07/07/2017 22  5 - 34 U/L Final  . ALT 07/07/2017 7  0 - 55 U/L Final  . Alkaline Phosphatase 07/07/2017 89  40 - 150 U/L Final  . Total Bilirubin 07/07/2017 0.3  0.2 - 1.2 mg/dL Final  . GFR, Est Non Af Am 07/07/2017 19* >60 mL/min Final  . GFR, Est AFR Am 07/07/2017 21* >60 mL/min Final   Comment: (NOTE) The eGFR has been calculated using the CKD EPI equation. This calculation has not been validated in all clinical situations. eGFR's persistently <60 mL/min signify possible Chronic Kidney Disease.   Georgiann Hahn gap 07/07/2017 12* 3 - 11 Final   Performed at [facility], [place] [address], [place], [place] [address]  .  WBC Count 07/07/2017 10.6* 3.9 - 10.3 K/uL Final  . RBC 07/07/2017 4.38  3.70 - 5.45 MIL/uL Final  . Hemoglobin 07/07/2017 12.7  11.6 - 15.9 g/dL Final  . HCT 07/07/2017 38.6  34.8 - 46.6 % Final  .  MCV 07/07/2017 88.1  79.5 - 101.0 fL Final  . MCH 07/07/2017 29.0  25.1 - 34.0 pg Final  . MCHC 07/07/2017 32.9  31.5 - 36.0 g/dL Final  . RDW 07/07/2017 19.2* 11.2 - 14.5 % Final  . Platelet Count 07/07/2017 176  145 - 400 K/uL Final  . Neutrophils Relative % 07/07/2017 50  % Final  . Neutro Abs 07/07/2017 5.2  1.5 - 6.5 K/uL Final  . Lymphocytes Relative 07/07/2017 37  % Final  . Lymphs Abs 07/07/2017 4.0* 0.9 - 3.3 K/uL Final  . Monocytes Relative 07/07/2017 13  % Final  . Monocytes Absolute 07/07/2017 1.4* 0.1 - 0.9 K/uL Final  . Eosinophils Relative 07/07/2017 0  % Final  . Eosinophils Absolute 07/07/2017 0.0  0.0 - 0.5 K/uL Final  . Basophils Relative 07/07/2017 0  % Final  . Basophils Absolute 07/07/2017 0.0  0.0 - 0.1 K/uL Final  . Smear Review 07/07/2017 Large and giant platlets, few ovalocytes, teardrop and schistocytes seen   Final   Performed at [facility], [place] [address], [place], [place] [address]  . IgG (Immunoglobin G), Serum 07/07/2017 1,275  700 - 1,600 mg/dL Final  . IgA 07/07/2017 233  64 - 422 mg/dL Final  . IgM (Immunoglobulin M), Srm 07/07/2017 43  26 - 217 mg/dL Final  . IFE 1 07/07/2017 Comment   Final   Comment: (NOTE) An apparent normal immunofixation pattern. Performed At: [facility] [place], [place] [id number] Rush Farmer MD [id number] Performed at [facility], [place] [address] [place], [place] [address]        Ardath Sax, MD

## 2017-08-11 ENCOUNTER — Ambulatory Visit (INDEPENDENT_AMBULATORY_CARE_PROVIDER_SITE_OTHER): Payer: Medicare Other | Admitting: Orthopaedic Surgery

## 2017-08-11 ENCOUNTER — Encounter (INDEPENDENT_AMBULATORY_CARE_PROVIDER_SITE_OTHER): Payer: Self-pay | Admitting: Orthopaedic Surgery

## 2017-08-11 ENCOUNTER — Ambulatory Visit (INDEPENDENT_AMBULATORY_CARE_PROVIDER_SITE_OTHER): Payer: Medicare Other

## 2017-08-11 DIAGNOSIS — M25551 Pain in right hip: Secondary | ICD-10-CM

## 2017-08-11 DIAGNOSIS — S72141D Displaced intertrochanteric fracture of right femur, subsequent encounter for closed fracture with routine healing: Secondary | ICD-10-CM

## 2017-08-11 NOTE — Progress Notes (Signed)
Office Visit Note   Patient: Sarah Johnson           Date of Birth: [birth year]           MRN: [id number] Visit Date: 08/11/2017              Requested by: Jani Gravel, [place], [place] [address] PCP: Jani Gravel, MD   Assessment & Plan: Visit Diagnoses:  1. Closed displaced intertrochanteric fracture of right femur with routine healing, subsequent encounter   2. Pain in right hip     Plan: Overall x-rays demonstrate that she is healing the fracture although this is progressing quite slowly.  I do think that clinically speaking she is doing significantly better.  I would like to continue to follow her until she demonstrates radiographic healing.  See her back as weeks with 2 view x-rays of the right hip.  Follow-Up Instructions: Return in about 6 weeks (around 09/22/2017).   Orders:  Orders Placed This Encounter  Procedures  . XR HIP UNILAT W OR W/O PELVIS 2-3 VIEWS RIGHT   No orders of the defined types were placed in this encounter.     Procedures: No procedures performed   Clinical Data: No additional findings.   Subjective: Chief Complaint  Patient presents with  . Right Hip - Pain, Follow-up    Patient is a 84 year old female who is 113 days status post intramedullary fixation of a subtrochanteric femur fracture.  She reports that she has been doing much better since her last visit.  She denies any pain.  Her daughter states that she is now able to walk up and down the hallway at her skilled nursing facility with a walker without any pain or having to take a break.  She seems to be a lot more comfortable today.   Review of Systems   Objective: Vital Signs: There were no vitals taken for this visit.  Physical Exam  Ortho Exam Right hip exam shows no tenderness palpation.  Painless rotation of the hip. Specialty Comments:  No specialty comments available.  Imaging: Xr Hip Unilat W Or W/o Pelvis 2-3 Views Right  Result  Date: 08/11/2017 Increasing callus formation and bony consolidation    PMFS History: Patient Active Problem List   Diagnosis Date Noted  . Closed displaced intertrochanteric fracture of right femur ([place]) 04/18/2017  . Preoperative evaluation of a medical condition to rule out surgical contraindications (TAR required)   . Cellulitis 11/28/2015  . Cellulitis of right hand 11/28/2015  . Hypothyroidism 11/28/2015  . COPD (chronic obstructive pulmonary disease) ([place]) 09/25/2015  . Leukocytosis 02/11/2015  . Hypokalemia 02/11/2015  . Hypomagnesemia 02/11/2015  . Deep vein thrombosis (DVT) of right lower extremity ([place]) 02/08/2015  . CKD (chronic kidney disease), stage II 02/05/2015  . Anemia of chronic renal failure, stage 2 (mild) 02/05/2015  . Left lower lobe pneumonia ([place]) 02/05/2015  . Sepsis ([place])   . History of Clostridium difficile colitis 05/09/2014  . History of aortic valve replacement 11/05/2010  . HTN (hypertension) 11/04/2009   Past Medical History:  Diagnosis Date  . Arthritis   . Blind right eye   . Cataract    RT  . CKD (chronic kidney disease)   . COPD (chronic obstructive pulmonary disease) ([place])   . Emphysema    followed by Dr. Joya Gaskins  . GERD (gastroesophageal reflux disease)   . History of Doppler echocardiogram    carotid dopplers. (8/11) no significant stenosis  .  HTN (hypertension)   . Hx of echocardiogram 2015   Echo (08/2013):  EF 60-65%, no RWMA, Gr 1 DD, AVR ok (mean 5 mmHg), mild MR, PASP 22 mmHg  . Hyperlipidemia   . Hypothyroidism   . Pulmonary embolism ([place]) 2010   She is no onger taking coumadin  . S/P aortic valve replacement with bioprosthetic valve    ehco (8/11) with EF 55-60%, mild-mod MR, mild-mod TR, poorly visualized bioprosthetic aortic valve but no significant regurgitation and gradient not significanly elevated.    . S/P thoracic aortic aneurysm repair 1/10   also with bioprosthetic aortic valve prelacement [facility]). in 10/10  she has a descending thoraic aorta anuerysm and abdominal reapir [facility]). Vascular urgeon is Dr. Lunette Stands.   Marland Kitchen SVT (supraventricular tachycardia) ([facility])    HOLTER MONITOR AFTER SURGERY     Family History  Problem Relation Age of Onset  . Brain cancer Maternal Grandmother   . Coronary artery disease Unknown        no premature CAD    Past Surgical History:  Procedure Laterality Date  . ANOMALOUS PULMONARY VENOUS RETURN REPAIR    . BACK SURGERY  1970   . EYE SURGERY     AS BABY- RT EYE  POOR SIGHT  . INTRAMEDULLARY (IM) NAIL INTERTROCHANTERIC Right 04/20/2017   Procedure: INTRAMEDULLARY (IM) NAIL INTERTROCHANTRIC;  Surgeon: Leandrew Koyanagi, MD;  Location: [facility];  Service: Orthopedics;  Laterality: Right;  . LUMBAR LAMINECTOMY/DECOMPRESSION MICRODISCECTOMY  05/31/2011   Procedure: LUMBAR LAMINECTOMY/DECOMPRESSION MICRODISCECTOMY;  Surgeon: Elaina Hoops, MD;  Location: [place] NEURO ORS;  Service: Neurosurgery;  Laterality: Bilateral;  Bilateral Lumbar three-four decompressionj lumbar laminectomy   . THORACIC AORTIC ANEURYSM REPAIR  1/10   VALVE ALSO DONE  . THORACOABDOMINAL AORTIC ANEURYSM REPAIR  01/28/09   repair  . TONSILLECTOMY     Social History   Occupational History  . Occupation: Retired  Tobacco Use  . Smoking status: Former Smoker    Packs/day: 1.00    Years: 20.00    Pack years: 20.00    Types: Cigarettes    Last attempt to quit: 04/26/2004    Years since quitting: 13.3  . Smokeless tobacco: Never Used  . Tobacco comment: quit in 2006  Substance and Sexual Activity  . Alcohol use: No  . Drug use: No  . Sexual activity: Yes    Birth control/protection: Post-menopausal

## 2017-08-16 NOTE — Telephone Encounter (Signed)
Error opening  

## 2017-08-29 ENCOUNTER — Emergency Department (HOSPITAL_COMMUNITY): Payer: Medicare Other

## 2017-08-29 ENCOUNTER — Other Ambulatory Visit: Payer: Self-pay

## 2017-08-29 ENCOUNTER — Inpatient Hospital Stay (HOSPITAL_COMMUNITY)
Admission: EM | Admit: 2017-08-29 | Discharge: 2017-09-02 | DRG: 193 | Disposition: A | Discharge status: Skilled Nursing Facility | Payer: Medicare Other | Attending: Internal Medicine | Admitting: Internal Medicine

## 2017-08-29 DIAGNOSIS — J441 Chronic obstructive pulmonary disease with (acute) exacerbation: Secondary | ICD-10-CM | POA: Diagnosis not present

## 2017-08-29 DIAGNOSIS — L89612 Pressure ulcer of right heel, stage 2: Secondary | ICD-10-CM | POA: Diagnosis present

## 2017-08-29 DIAGNOSIS — I130 Hypertensive heart and chronic kidney disease with heart failure and stage 1 through stage 4 chronic kidney disease, or unspecified chronic kidney disease: Secondary | ICD-10-CM | POA: Diagnosis present

## 2017-08-29 DIAGNOSIS — T17908A Unspecified foreign body in respiratory tract, part unspecified causing other injury, initial encounter: Secondary | ICD-10-CM

## 2017-08-29 DIAGNOSIS — F32A Depression, unspecified: Secondary | ICD-10-CM | POA: Diagnosis present

## 2017-08-29 DIAGNOSIS — N179 Acute kidney failure, unspecified: Secondary | ICD-10-CM | POA: Diagnosis present

## 2017-08-29 DIAGNOSIS — Z7982 Long term (current) use of aspirin: Secondary | ICD-10-CM

## 2017-08-29 DIAGNOSIS — G9341 Metabolic encephalopathy: Secondary | ICD-10-CM | POA: Diagnosis present

## 2017-08-29 DIAGNOSIS — J449 Chronic obstructive pulmonary disease, unspecified: Secondary | ICD-10-CM

## 2017-08-29 DIAGNOSIS — F419 Anxiety disorder, unspecified: Secondary | ICD-10-CM | POA: Diagnosis present

## 2017-08-29 DIAGNOSIS — L89152 Pressure ulcer of sacral region, stage 2: Secondary | ICD-10-CM | POA: Diagnosis present

## 2017-08-29 DIAGNOSIS — R4182 Altered mental status, unspecified: Secondary | ICD-10-CM | POA: Diagnosis not present

## 2017-08-29 DIAGNOSIS — D631 Anemia in chronic kidney disease: Secondary | ICD-10-CM | POA: Diagnosis present

## 2017-08-29 DIAGNOSIS — J189 Pneumonia, unspecified organism: Secondary | ICD-10-CM

## 2017-08-29 DIAGNOSIS — L89312 Pressure ulcer of right buttock, stage 2: Secondary | ICD-10-CM | POA: Diagnosis present

## 2017-08-29 DIAGNOSIS — E785 Hyperlipidemia, unspecified: Secondary | ICD-10-CM | POA: Diagnosis not present

## 2017-08-29 DIAGNOSIS — L89159 Pressure ulcer of sacral region, unspecified stage: Secondary | ICD-10-CM | POA: Diagnosis not present

## 2017-08-29 DIAGNOSIS — E039 Hypothyroidism, unspecified: Secondary | ICD-10-CM | POA: Diagnosis present

## 2017-08-29 DIAGNOSIS — M1990 Unspecified osteoarthritis, unspecified site: Secondary | ICD-10-CM | POA: Diagnosis present

## 2017-08-29 DIAGNOSIS — Z952 Presence of prosthetic heart valve: Secondary | ICD-10-CM

## 2017-08-29 DIAGNOSIS — R279 Unspecified lack of coordination: Secondary | ICD-10-CM | POA: Diagnosis not present

## 2017-08-29 DIAGNOSIS — F329 Major depressive disorder, single episode, unspecified: Secondary | ICD-10-CM | POA: Diagnosis present

## 2017-08-29 DIAGNOSIS — F0390 Unspecified dementia without behavioral disturbance: Secondary | ICD-10-CM | POA: Diagnosis present

## 2017-08-29 DIAGNOSIS — Y95 Nosocomial condition: Secondary | ICD-10-CM | POA: Diagnosis present

## 2017-08-29 DIAGNOSIS — Z66 Do not resuscitate: Secondary | ICD-10-CM | POA: Diagnosis present

## 2017-08-29 DIAGNOSIS — M6281 Muscle weakness (generalized): Secondary | ICD-10-CM | POA: Diagnosis not present

## 2017-08-29 DIAGNOSIS — I10 Essential (primary) hypertension: Secondary | ICD-10-CM | POA: Diagnosis not present

## 2017-08-29 DIAGNOSIS — Z953 Presence of xenogenic heart valve: Secondary | ICD-10-CM | POA: Diagnosis not present

## 2017-08-29 DIAGNOSIS — Z743 Need for continuous supervision: Secondary | ICD-10-CM | POA: Diagnosis not present

## 2017-08-29 DIAGNOSIS — H5461 Unqualified visual loss, right eye, normal vision left eye: Secondary | ICD-10-CM | POA: Diagnosis present

## 2017-08-29 DIAGNOSIS — N183 Chronic kidney disease, stage 3 unspecified: Secondary | ICD-10-CM | POA: Diagnosis present

## 2017-08-29 DIAGNOSIS — J9600 Acute respiratory failure, unspecified whether with hypoxia or hypercapnia: Secondary | ICD-10-CM | POA: Diagnosis not present

## 2017-08-29 DIAGNOSIS — R0602 Shortness of breath: Secondary | ICD-10-CM | POA: Diagnosis not present

## 2017-08-29 DIAGNOSIS — E871 Hypo-osmolality and hyponatremia: Secondary | ICD-10-CM | POA: Diagnosis not present

## 2017-08-29 DIAGNOSIS — J439 Emphysema, unspecified: Secondary | ICD-10-CM | POA: Diagnosis not present

## 2017-08-29 DIAGNOSIS — Z885 Allergy status to narcotic agent status: Secondary | ICD-10-CM

## 2017-08-29 DIAGNOSIS — J9621 Acute and chronic respiratory failure with hypoxia: Secondary | ICD-10-CM | POA: Diagnosis not present

## 2017-08-29 DIAGNOSIS — J154 Pneumonia due to other streptococci: Secondary | ICD-10-CM | POA: Diagnosis not present

## 2017-08-29 DIAGNOSIS — Z880 Allergy status to penicillin: Secondary | ICD-10-CM

## 2017-08-29 DIAGNOSIS — K219 Gastro-esophageal reflux disease without esophagitis: Secondary | ICD-10-CM | POA: Diagnosis present

## 2017-08-29 DIAGNOSIS — Z86711 Personal history of pulmonary embolism: Secondary | ICD-10-CM | POA: Diagnosis not present

## 2017-08-29 DIAGNOSIS — R2681 Unsteadiness on feet: Secondary | ICD-10-CM | POA: Diagnosis not present

## 2017-08-29 DIAGNOSIS — R278 Other lack of coordination: Secondary | ICD-10-CM | POA: Diagnosis not present

## 2017-08-29 DIAGNOSIS — Z9081 Acquired absence of spleen: Secondary | ICD-10-CM

## 2017-08-29 DIAGNOSIS — R531 Weakness: Secondary | ICD-10-CM | POA: Diagnosis not present

## 2017-08-29 DIAGNOSIS — I5032 Chronic diastolic (congestive) heart failure: Secondary | ICD-10-CM | POA: Diagnosis present

## 2017-08-29 DIAGNOSIS — R402441 Other coma, without documented Glasgow coma scale score, or with partial score reported, in the field [EMT or ambulance]: Secondary | ICD-10-CM | POA: Diagnosis not present

## 2017-08-29 DIAGNOSIS — Z79899 Other long term (current) drug therapy: Secondary | ICD-10-CM

## 2017-08-29 DIAGNOSIS — J168 Pneumonia due to other specified infectious organisms: Secondary | ICD-10-CM | POA: Diagnosis not present

## 2017-08-29 DIAGNOSIS — F0391 Unspecified dementia with behavioral disturbance: Secondary | ICD-10-CM | POA: Diagnosis present

## 2017-08-29 DIAGNOSIS — Z7952 Long term (current) use of systemic steroids: Secondary | ICD-10-CM

## 2017-08-29 DIAGNOSIS — Z87891 Personal history of nicotine dependence: Secondary | ICD-10-CM

## 2017-08-29 DIAGNOSIS — R1312 Dysphagia, oropharyngeal phase: Secondary | ICD-10-CM | POA: Diagnosis not present

## 2017-08-29 DIAGNOSIS — R41841 Cognitive communication deficit: Secondary | ICD-10-CM | POA: Diagnosis not present

## 2017-08-29 LAB — CBC
HEMATOCRIT: 42.9 % (ref 36.0–46.0)
HEMOGLOBIN: 14.3 g/dL (ref 12.0–15.0)
MCH: 31.1 pg (ref 26.0–34.0)
MCHC: 33.3 g/dL (ref 30.0–36.0)
MCV: 93.3 fL (ref 78.0–100.0)
Platelets: 133 10*3/uL — ABNORMAL LOW (ref 150–400)
RBC: 4.6 MIL/uL (ref 3.87–5.11)
RDW: 20.1 % — ABNORMAL HIGH (ref 11.5–15.5)
WBC: 12.7 10*3/uL — ABNORMAL HIGH (ref 4.0–10.5)

## 2017-08-29 LAB — BLOOD GAS, ARTERIAL
Acid-Base Excess: 7.5 mmol/L — ABNORMAL HIGH (ref 0.0–2.0)
Bicarbonate: 32.5 mmol/L — ABNORMAL HIGH (ref 20.0–28.0)
DRAWN BY: 422461
O2 Content: 3 L/min
O2 Saturation: 93.9 %
PATIENT TEMPERATURE: 97.9
pCO2 arterial: 47.9 mmHg (ref 32.0–48.0)
pH, Arterial: 7.445 (ref 7.350–7.450)
pO2, Arterial: 71.4 mmHg — ABNORMAL LOW (ref 83.0–108.0)

## 2017-08-29 LAB — COMPREHENSIVE METABOLIC PANEL
ALBUMIN: 3.6 g/dL (ref 3.5–5.0)
ALT: 19 U/L (ref 14–54)
ANION GAP: 17 — AB (ref 5–15)
AST: 44 U/L — ABNORMAL HIGH (ref 15–41)
Alkaline Phosphatase: 61 U/L (ref 38–126)
BUN: 53 mg/dL — ABNORMAL HIGH (ref 6–20)
CHLORIDE: 93 mmol/L — AB (ref 101–111)
CO2: 31 mmol/L (ref 22–32)
Calcium: 10.1 mg/dL (ref 8.9–10.3)
Creatinine, Ser: 2.83 mg/dL — ABNORMAL HIGH (ref 0.44–1.00)
GFR calc non Af Amer: 15 mL/min — ABNORMAL LOW (ref >60)
GFR, EST AFRICAN AMERICAN: 17 mL/min — AB (ref >60)
GLUCOSE: 103 mg/dL — AB (ref 65–99)
POTASSIUM: 3.9 mmol/L (ref 3.5–5.1)
SODIUM: 141 mmol/L (ref 135–145)
Total Bilirubin: 0.4 mg/dL (ref 0.3–1.2)
Total Protein: 7.5 g/dL (ref 6.5–8.1)

## 2017-08-29 LAB — URINALYSIS, COMPLETE (UACMP) WITH MICROSCOPIC
Bilirubin Urine: NEGATIVE
Glucose, UA: NEGATIVE mg/dL
KETONES UR: 80 mg/dL — AB
LEUKOCYTES UA: NEGATIVE
NITRITE: NEGATIVE
PH: 6 (ref 5.0–8.0)
Protein, ur: NEGATIVE mg/dL
Specific Gravity, Urine: 1.003 — ABNORMAL LOW (ref 1.005–1.030)

## 2017-08-29 LAB — CBG MONITORING, ED: GLUCOSE-CAPILLARY: 97 mg/dL (ref 65–99)

## 2017-08-29 MED ORDER — IPRATROPIUM-ALBUTEROL 0.5-2.5 (3) MG/3ML IN SOLN
3.0000 mL | RESPIRATORY_TRACT | Status: DC
  Administered 2017-08-30: 3 mL via RESPIRATORY_TRACT
  Filled 2017-08-29: qty 3

## 2017-08-29 MED ORDER — SODIUM CHLORIDE 0.9 % IV SOLN
INTRAVENOUS | Status: DC
Start: 2017-08-29 — End: 2017-09-01
  Administered 2017-08-30 – 2017-08-31 (×3): via INTRAVENOUS

## 2017-08-29 MED ORDER — ALBUTEROL SULFATE (2.5 MG/3ML) 0.083% IN NEBU: 2.5000 mg | INHALATION_SOLUTION | RESPIRATORY_TRACT | Status: DC | PRN

## 2017-08-29 MED ORDER — SODIUM CHLORIDE 0.9 % IV BOLUS (SEPSIS): 500.0000 mL | Freq: Once | INTRAVENOUS | Status: DC

## 2017-08-29 MED ORDER — METHYLPREDNISOLONE SODIUM SUCC 125 MG IJ SOLR
60.0000 mg | Freq: Two times a day (BID) | INTRAMUSCULAR | Status: DC
  Administered 2017-08-30 – 2017-09-01 (×5): 60 mg via INTRAVENOUS
  Filled 2017-08-29 (×5): qty 2

## 2017-08-29 MED ORDER — LEVOTHYROXINE SODIUM 100 MCG IV SOLR
50.0000 ug | Freq: Every day | INTRAVENOUS | Status: DC
  Administered 2017-08-30 – 2017-08-31 (×2): 50 ug via INTRAVENOUS
  Filled 2017-08-29 (×2): qty 5

## 2017-08-29 MED ORDER — LORAZEPAM 2 MG/ML IJ SOLN
0.2500 mg | Freq: Four times a day (QID) | INTRAMUSCULAR | Status: DC | PRN
  Administered 2017-08-31: 0.25 mg via INTRAVENOUS
  Filled 2017-08-29: qty 1

## 2017-08-29 MED ORDER — SODIUM CHLORIDE 0.9 % IV SOLN: 500.0000 mg | Freq: Once | INTRAVENOUS | Status: DC

## 2017-08-29 MED ORDER — SODIUM CHLORIDE 0.9 % IV SOLN
1000.0000 mL | INTRAVENOUS | Status: DC
  Administered 2017-08-30: 1000 mL via INTRAVENOUS

## 2017-08-29 MED ORDER — SODIUM CHLORIDE 0.9 % IV SOLN: 1.0000 g | Freq: Once | INTRAVENOUS | Status: DC

## 2017-08-29 MED ORDER — ENOXAPARIN SODIUM 40 MG/0.4ML ~~LOC~~ SOLN
40.0000 mg | Freq: Every day | SUBCUTANEOUS | Status: DC
  Filled 2017-08-29: qty 0.4

## 2017-08-29 MED ORDER — FAMOTIDINE IN NACL 20-0.9 MG/50ML-% IV SOLN
20.0000 mg | Freq: Two times a day (BID) | INTRAVENOUS | Status: DC
  Administered 2017-08-30 – 2017-08-31 (×3): 20 mg via INTRAVENOUS
  Filled 2017-08-29 (×3): qty 50

## 2017-08-29 NOTE — ED Triage Notes (Signed)
Pt from  [facility] sent for change in mental status lethargic per daughters report to EMS. Also sent for eval of blisters on right heel and buttock. BP: 110/60 Hr: 80 resp: 18 Sat : 96% on 3l n/c  CBG: 146

## 2017-08-29 NOTE — ED Notes (Signed)
Bed: OY24 Expected date:  Expected time:  Means of arrival:  Comments: EMS 84 yo from SNF-lethargic/bed sores-patient is lethargic with no complaints

## 2017-08-29 NOTE — H&P (Signed)
History and Physical    Sarah Johnson JKD:[id number] DOB: 1933-03-10 DOA: 08/29/2017  Referring MD/NP/PA:   PCP: Jani Gravel, MD   Patient coming from:  The patient is coming from memory care center.  At baseline, pt is dependent for most of ADL  Chief Complaint: Altered mental status, cough  HPI: Sarah Johnson is a 84 y.o. female with medical history significant of splenectomy, hypertension, hyperlipidemia, COPD, GERD, hypothyroidism, depression with anxiety, right eye blindness, CKD-3, PE/DVT not on anticoagulants, s/p of aortic valve replacement with bioprosthetic valve, s/p of thoracic aortic aneurysm repair, dementia, who presents with altered mental status and cough.  Per her 2 daughters, patient becomes confused, is very lethargic, more sleepy and less interactive today.  Patient has cough without mucus production, does not seem to have chest pain, fever or chills.  No nausea, vomiting, diarrhea notated.  Not sure if patient has symptoms of UTI.  Patient moves all extremities normally.  Patient has a blister in the right heel and pressure ulcer in the sacral area.  ED Course: pt was found to have WBC 12.7, negative urinalysis, worsening renal function, no tachycardia, no tachypnea, oxygen saturation 98% on 2 L nasal cannula oxygen, temperature normal, chest x-ray showed left basilar infiltration and emphysematous change.  Patient is admitted to telemetry bed as inpatient.   Review of Systems: Could not be reviewed accurately due to altered mental status  Allergy:  Allergies  Allergen Reactions  . Codeine Other (See Comments)    Pt. States it has been too long to remember the reaction   . Penicillins Other (See Comments)    Pt. Does not recall the reaction.  Has patient had a PCN reaction causing immediate rash, facial/tongue/throat swelling, SOB or lightheadedness with hypotension:  Has patient had a PCN reaction causing severe rash involving mucus membranes or skin  necrosis: Has patient had a PCN reaction that required hospitalization  Has patient had a PCN reaction occurring within the last 10 years:  If all of the above answers are "NO", then may proceed with Cephalosporin use.     Past Medical History:  Diagnosis Date  . Arthritis   . Blind right eye   . Cataract    RT  . CKD (chronic kidney disease)   . COPD (chronic obstructive pulmonary disease) ([place])   . Emphysema    followed by Dr. Joya Gaskins  . GERD (gastroesophageal reflux disease)   . History of Doppler echocardiogram    carotid dopplers. (8/11) no significant stenosis  . HTN (hypertension)   . Hx of echocardiogram 2015   Echo (08/2013):  EF 60-65%, no RWMA, Gr 1 DD, AVR ok (mean 5 mmHg), mild MR, PASP 22 mmHg  . Hyperlipidemia   . Hypothyroidism   . Pulmonary embolism ([place]) 2010   She is no onger taking coumadin  . S/P aortic valve replacement with bioprosthetic valve    ehco (8/11) with EF 55-60%, mild-mod MR, mild-mod TR, poorly visualized bioprosthetic aortic valve but no significant regurgitation and gradient not significanly elevated.    . S/P thoracic aortic aneurysm repair 1/10   also with bioprosthetic aortic valve prelacement [facility]). in 10/10 she has a descending thoraic aorta anuerysm and abdominal reapir [facility]). Vascular urgeon is Dr. Lunette Stands.   Marland Kitchen SVT (supraventricular tachycardia) (HCC)    HOLTER MONITOR AFTER SURGERY     Past Surgical History:  Procedure Laterality Date  . ANOMALOUS PULMONARY VENOUS RETURN REPAIR    . BACK SURGERY  1970   . EYE SURGERY     AS BABY- RT EYE  POOR SIGHT  . INTRAMEDULLARY (IM) NAIL INTERTROCHANTERIC Right 04/20/2017   Procedure: INTRAMEDULLARY (IM) NAIL INTERTROCHANTRIC;  Surgeon: Leandrew Koyanagi, MD;  Location: [place];  Service: Orthopedics;  Laterality: Right;  . LUMBAR LAMINECTOMY/DECOMPRESSION MICRODISCECTOMY  05/31/2011   Procedure: LUMBAR LAMINECTOMY/DECOMPRESSION MICRODISCECTOMY;  Surgeon: Elaina Hoops, MD;  Location:  [place] [facility];  Service: Neurosurgery;  Laterality: Bilateral;  Bilateral Lumbar three-four decompressionj lumbar laminectomy   . THORACIC AORTIC ANEURYSM REPAIR  1/10   VALVE ALSO DONE  . THORACOABDOMINAL AORTIC ANEURYSM REPAIR  01/28/09   repair  . TONSILLECTOMY      Social History:  reports that she quit smoking about 13 years ago. Her smoking use included cigarettes. She has a 20.00 pack-year smoking history. She has never used smokeless tobacco. She reports that she does not drink alcohol or use drugs.  Family History:  Family History  Problem Relation Age of Onset  . Brain cancer Maternal Grandmother   . Coronary artery disease Unknown        no premature CAD     Prior to Admission medications   Medication Sig Start Date End Date Taking? Authorizing Provider  acetaminophen (TYLENOL) 500 MG tablet Take 500 mg by mouth every 6 (six) hours as needed for moderate pain.   Yes [provider]  ALPRAZolam Duanne Moron) 0.5 MG tablet Take 0.5 mg by mouth 3 (three) times daily.   Yes [provider]  ALPRAZolam Duanne Moron) 0.5 MG tablet Take 0.5 mg by mouth every 4 (four) hours as needed for anxiety (*May give 1 hour before or after scheduled dose).   Yes [provider]  aspirin EC 81 MG tablet Take 81 mg by mouth daily.   Yes [provider]  divalproex (DEPAKOTE ER) 250 MG 24 hr tablet Take 250 mg by mouth at bedtime. 08/22/17  Yes [provider]  donepezil (ARICEPT) 10 MG tablet TAKE 1 TABLET (10 MG TOTAL) BY MOUTH AT BEDTIME. 10/19/14  Yes Star Age, MD  furosemide (LASIX) 40 MG tablet Take 40 mg by mouth 2 (two) times daily.   Yes [provider]  ipratropium-albuterol (DUONEB) 0.5-2.5 (3) MG/3ML SOLN Take 3 mLs by nebulization 2 (two) times daily. Dx: J44.9 08/13/16  Yes Parrett, Tammy S, NP  levothyroxine (SYNTHROID, LEVOTHROID) 125 MCG tablet Take 125 mcg by mouth daily before breakfast.   Yes [provider]  memantine (NAMENDA)  10 MG tablet Take 10 mg by mouth 2 (two) times daily.  09/01/15  Yes [provider]  mometasone-formoterol (DULERA) 100-5 MCG/ACT AERO INHALE 2 PUFFS INTO THE LUNGS 2 TIMES DAILY. RINSE MOUTH AFTER EACH USE 09/17/15  Yes Parrett, Tammy S, NP  Multiple Vitamins-Minerals (CERTAVITE/ANTIOXIDANTS PO) Take 1 tablet by mouth daily.   Yes [provider]  predniSONE (DELTASONE) 5 MG tablet Take 5 mg by mouth daily with breakfast.   Yes [provider]  PROAIR HFA 108 (90 BASE) MCG/ACT inhaler Inhale 2 puffs into the lungs every 6 (six) hours as needed. Patient taking differently: Inhale 2 puffs into the lungs every 6 (six) hours as needed for wheezing or shortness of breath.  05/08/14  Yes Elsie Stain, MD  QUEtiapine (SEROQUEL) 25 MG tablet Take 12.5 mg by mouth at bedtime.   Yes [provider]  sertraline (ZOLOFT) 50 MG tablet Take 50-75 mg by mouth 2 (two) times daily. Takes 75 mg in the morning  and 50 mg at bedtime. 07/19/13  Yes [provider]  atorvastatin (LIPITOR) 20 MG tablet TAKE 1 TABLET BY MOUTH EVERY DAY Patient not taking: Reported on 08/29/2017 09/20/14   Larey Dresser, MD  ibuprofen (ADVIL,MOTRIN) 200 MG tablet Take 2 tablets (400 mg total) by mouth daily as needed for moderate pain. Patient not taking: Reported on 08/29/2017 02/12/15   Florencia Reasons, MD  metoprolol tartrate (LOPRESSOR) 25 MG tablet Take 0.5 tablets (12.5 mg total) by mouth 2 (two) times daily. Patient not taking: Reported on 08/29/2017 02/12/15   Florencia Reasons, MD  oxyCODONE (OXY IR/ROXICODONE) 5 MG immediate release tablet Take 1-3 tablets (5-15 mg total) by mouth every 4 (four) hours as needed. Patient not taking: Reported on 08/29/2017 04/20/17   Leandrew Koyanagi, MD  pantoprazole (PROTONIX) 40 MG tablet Take 1 tablet (40 mg total) by mouth daily at 6 (six) AM. Patient not taking: Reported on 08/29/2017 04/23/17   Donne Hazel, MD  saccharomyces boulardii (FLORASTOR) 250 MG capsule Take 1  capsule (250 mg total) by mouth 2 (two) times daily. Patient not taking: Reported on 08/29/2017 03/29/14   Robbie Lis, MD  thiamine 100 MG tablet Take 1 tablet (100 mg total) by mouth daily. Patient not taking: Reported on 08/29/2017 02/12/15   Florencia Reasons, MD    Physical Exam: Vitals:   08/29/17 2002 08/29/17 2006 08/30/17 0049 08/30/17 0138  BP:  124/69  109/63  Pulse:  81  75  Resp:  18  14  Temp:  97.9 F (36.6 C)    TempSrc:  Oral    SpO2: 96% 98% 93% 94%   General: Not in acute distress HEENT:       Eyes: PERRL, EOMI, no scleral icterus.       ENT: No discharge from the ears and nose, no pharynx injection, no tonsillar enlargement.        Neck: No JVD, no bruit, no mass felt. Heme: No neck lymph node enlargement. Cardiac: S1/S2, RRR, No murmurs, No gallops or rubs. Respiratory: has rhonchi bilaterally GI: Soft, nondistended, nontender, no rebound pain, no organomegaly, BS present. GU: No hematuria Ext: No pitting leg edema bilaterally. 2+DP/PT pulse bilaterally. Musculoskeletal: No joint deformities, No joint redness or warmth, no limitation of ROM in spin. Skin: has sacral pressure ulcer and a blister over right heel Neuro: confused, oriented to place and person, but not to time, cranial nerves II-XII grossly intact, moves all extremities. Psych: Patient is not psychotic, no suicidal or hemocidal ideation.  Labs on Admission: I have personally reviewed following labs and imaging studies  CBC: Recent Labs  Lab 08/29/17 2004  WBC 12.7*  HGB 14.3  HCT 42.9  MCV 93.3  PLT 381*   Basic Metabolic Panel: Recent Labs  Lab 08/29/17 2004  NA 141  K 3.9  CL 93*  CO2 31  GLUCOSE 103*  BUN 53*  CREATININE 2.83*  CALCIUM 10.1   GFR: CrCl cannot be calculated (Unknown ideal weight.). Liver Function Tests: Recent Labs  Lab 08/29/17 2004  AST 44*  ALT 19  ALKPHOS 61  BILITOT 0.4  PROT 7.5  ALBUMIN 3.6   No results for input(s): LIPASE, AMYLASE in the last 168  hours. No results for input(s): AMMONIA in the last 168 hours. Coagulation Profile: No results for input(s): INR, PROTIME in the last 168 hours. Cardiac Enzymes: No results for input(s): CKTOTAL, CKMB, CKMBINDEX, TROPONINI in the last 168 hours. BNP (last 3 results) No results  for input(s): PROBNP in the last 8760 hours. HbA1C: No results for input(s): HGBA1C in the last 72 hours. CBG: Recent Labs  Lab 08/29/17 2017  GLUCAP 97   Lipid Profile: No results for input(s): CHOL, HDL, LDLCALC, TRIG, CHOLHDL, LDLDIRECT in the last 72 hours. Thyroid Function Tests: No results for input(s): TSH, T4TOTAL, FREET4, T3FREE, THYROIDAB in the last 72 hours. Anemia Panel: No results for input(s): VITAMINB12, FOLATE, FERRITIN, TIBC, IRON, RETICCTPCT in the last 72 hours. Urine analysis:    Component Value Date/Time   COLORURINE STRAW (A) 08/29/2017 2230   APPEARANCEUR CLEAR 08/29/2017 2230   LABSPEC 1.003 (L) 08/29/2017 2230   PHURINE 6.0 08/29/2017 2230   GLUCOSEU NEGATIVE 08/29/2017 2230   HGBUR MODERATE (A) 08/29/2017 2230   BILIRUBINUR NEGATIVE 08/29/2017 2230   KETONESUR 80 (A) 08/29/2017 2230   PROTEINUR NEGATIVE 08/29/2017 2230   UROBILINOGEN 0.2 02/04/2015 2036   NITRITE NEGATIVE 08/29/2017 2230   LEUKOCYTESUR NEGATIVE 08/29/2017 2230   Sepsis Labs: @LABRCNTIP (procalcitonin:4,lacticidven:4) )No results found for this or any previous visit (from the past 240 hour(s)).   Radiological Exams on Admission: Dg Chest 2 View  Result Date: 08/29/2017 CLINICAL DATA:  84 year old female with COPD and altered mental status. EXAM: CHEST - 2 VIEW COMPARISON:  Chest radiograph dated 06/21/2017 FINDINGS: There is emphysematous changes of the lungs. Left lung base/retrocardiac densities may represent atelectatic changes/scarring. Infiltrate is not excluded. Clinical correlation is recommended. There is blunting of the left costophrenic angle which may represent scarring or small effusion. There is  no pneumothorax. Stable top-normal cardiac size. There is prominence of the hila suggestive of pulmonary hypertension. There is osteopenia with degenerative changes of the spine. Midthoracic vertebroplasty changes noted. No acute osseous pathology. Median sternotomy wires. IMPRESSION: 1. Left lung base atelectasis/scarring versus infiltrate. Probable trace left pleural effusion. 2. Emphysema. Electronically Signed   By: Anner Crete M.D.   On: 08/29/2017 22:01   Ct Head Wo Contrast  Result Date: 08/29/2017 CLINICAL DATA:  Altered mental status.  Lethargy. EXAM: CT HEAD WITHOUT CONTRAST TECHNIQUE: Contiguous axial images were obtained from the base of the skull through the vertex without intravenous contrast. COMPARISON:  Head CT, 04/17/2017 FINDINGS: Brain: No evidence of acute infarction, hemorrhage, hydrocephalus, extra-axial collection or mass lesion/mass effect. There is ventricular sulcal enlargement reflecting moderate generalized atrophy. Mild periventricular white matter hypoattenuation is noted consistent with chronic microvascular ischemic change. Vascular: No hyperdense vessel or unexpected calcification. Skull: Normal. Negative for fracture or focal lesion. Sinuses/Orbits: Visualized globes and orbits are unremarkable. Visualized sinuses and mastoid air cells are clear. Other: None. IMPRESSION: 1. No acute intracranial abnormalities. 2. Atrophy and mild chronic microvascular ischemic change. Findings similar to the prior CT. Electronically Signed   By: Lajean Manes M.D.   On: 08/29/2017 22:03     EKG: Independently reviewed.  sinus rhythm, QTC 465, LAD, nonspecific T wave change.  Assessment/Plan Principal Problem:   Acute on chronic respiratory failure with hypoxia (HCC) Active Problems:   HLD (hyperlipidemia)   HTN (hypertension)   GERD (gastroesophageal reflux disease)   History of aortic valve replacement   HCAP (healthcare-associated pneumonia)   Acute renal failure  superimposed on stage 3 chronic kidney disease (HCC)   COPD with acute exacerbation (HCC)   Hypothyroidism   Dementia   Depression   Chronic diastolic CHF (congestive heart failure) (HCC)   Sacral pressure ulcer   Acute on chronic respiratory failure with hypoxia ([place]): ABG showed pH 7.445, PCO2 47.9, PO2 71.4.. Likely due  to COPD exacerbation.  HCAP vs. aspiration pneumonia also possible given chest x-ray showed left basilar infiltration.  Patient has leukocytosis, no fever, clinically is not completely consistent with pneumonia, but the patient has history of splenectomy, and is at high risk of deteriorating. Will start broad antibiotics.  Patient does not meet criteria for sepsis.  Lactic acid is pending.  Currently hemodynamically stable.  - will admit to tele bed as inpt - IV Vancomycin and Primaxin (discussed with pharmacist). - Mucinex for cough  - Solu-Medrol, 60 mg twice daily - prn Albuterol Nebs, atrovent nebs prn for SOB - Urine legionella and S. pneumococcal antigen - Follow up blood culture x2, sputum culture and plus Flu pcr - IVF: 500 cc and then 125 mL per hour of NS  Chronic diastolic CHF: 2D echo on 37/07/8887 showed EF CT deficit 5%.  Patient does not have leg edema JVD.  CHF seems to be compensated. -Hold Lasix -Check BNP  Acute metabolic encephalopathy: Etiology is not clear.  CT head is negative for acute intracranial abnormalities.  No focal neurologic findings on physical examination.  Likely multifactorial etiology, including hypoxia, worsening renal function. -Treat underlying issues -Frequent neuro check - Hold all oral medications until mental status improves.  HLD: -hold Lipitor  HTN:  -hold oral meds -IV hydralazine prn  GERD (gastroesophageal reflux disease) -IV Pepcid diet  AoCKD-III: Baseline Cre is 1.1-1.5, pt's Cre is 2.83, BUN 53 on admission. Likely due to prerenal secondary to dehydration and continuation of diuretics, NSAIDs. - IVF as  above - Follow up renal function by BMP - Check FeUrea - Hold Lasix and ibuprofen  Hypothyroidism: Last TSH 3.510 on 02/03/14 -Continue home Synthroid  Dementia: -hold oral meds -prn ativan for agitation  Depression: -hold oral meds  Pressure ulcer: -Wound care consult   DVT ppx: SQ Lovenox Code Status: DNR (I discussed with patient's daughters, one of them is POA, and explained the meaning of CODE STATUS. Per her POA, patient would wants to be DNR) Family Communication:     Yes, patient's 2 daughers at bed side Disposition Plan:  Anticipate discharge back to previous memory care center. Consults called:  none Admission status: Inpatient/tele    Date of Service 08/30/2017    Ivor Costa [facility] Pager [phone]  If 7PM-7AM, please contact night-coverage [email or web address] Password [email or web address] 08/30/2017, 2:16 AM

## 2017-08-29 NOTE — ED Provider Notes (Signed)
[facility] DEPT Provider Note   CSN: [id number] Arrival date & time: 08/29/17  1948     History   Chief Complaint Chief Complaint  Patient presents with  . Altered Mental Status    HPI NATURE Sarah Johnson is a 84 y.o. female.  HPI Patient presents to the emergency room for altered mental status.  Patient is residing in a nursing facility.  According to the staff at the facility the patient has been less active.  Her mental status has declined and she seemed rather listless.  No known fevers.  No known vomiting or diarrhea.  Nursing staff also noticed a blister on her right heel as well as a blistered area in her sacrum.  Patient herself denies any complaints.  She is not sure why she was sent to the hospital. Past Medical History:  Diagnosis Date  . Arthritis   . Blind right eye   . Cataract    RT  . CKD (chronic kidney disease)   . COPD (chronic obstructive pulmonary disease) ([place])   . Emphysema    followed by Dr. Joya Gaskins  . GERD (gastroesophageal reflux disease)   . History of Doppler echocardiogram    carotid dopplers. (8/11) no significant stenosis  . HTN (hypertension)   . Hx of echocardiogram 2015   Echo (08/2013):  EF 60-65%, no RWMA, Gr 1 DD, AVR ok (mean 5 mmHg), mild MR, PASP 22 mmHg  . Hyperlipidemia   . Hypothyroidism   . Pulmonary embolism ([place]) 2010   She is no onger taking coumadin  . S/P aortic valve replacement with bioprosthetic valve    ehco (8/11) with EF 55-60%, mild-mod MR, mild-mod TR, poorly visualized bioprosthetic aortic valve but no significant regurgitation and gradient not significanly elevated.    . S/P thoracic aortic aneurysm repair 1/10   also with bioprosthetic aortic valve prelacement [facility]). in 10/10 she has a descending thoraic aorta anuerysm and abdominal reapir [facility]). Vascular urgeon is Dr. Lunette Stands.   Marland Kitchen SVT (supraventricular tachycardia) ([place])    HOLTER MONITOR AFTER SURGERY     Patient  Active Problem List   Diagnosis Date Noted  . Closed displaced intertrochanteric fracture of right femur ([place]) 04/18/2017  . Preoperative evaluation of a medical condition to rule out surgical contraindications (TAR required)   . Cellulitis 11/28/2015  . Cellulitis of right hand 11/28/2015  . Hypothyroidism 11/28/2015  . COPD (chronic obstructive pulmonary disease) ([place]) 09/25/2015  . Leukocytosis 02/11/2015  . Hypokalemia 02/11/2015  . Hypomagnesemia 02/11/2015  . Deep vein thrombosis (DVT) of right lower extremity ([place]) 02/08/2015  . CKD (chronic kidney disease), stage II 02/05/2015  . Anemia of chronic renal failure, stage 2 (mild) 02/05/2015  . Left lower lobe pneumonia ([place]) 02/05/2015  . Sepsis ([place])   . History of Clostridium difficile colitis 05/09/2014  . History of aortic valve replacement 11/05/2010  . HTN (hypertension) 11/04/2009    Past Surgical History:  Procedure Laterality Date  . ANOMALOUS PULMONARY VENOUS RETURN REPAIR    . BACK SURGERY  1970   . EYE SURGERY     AS BABY- RT EYE  POOR SIGHT  . INTRAMEDULLARY (IM) NAIL INTERTROCHANTERIC Right 04/20/2017   Procedure: INTRAMEDULLARY (IM) NAIL INTERTROCHANTRIC;  Surgeon: Leandrew Koyanagi, MD;  Location: [facility];  Service: Orthopedics;  Laterality: Right;  . LUMBAR LAMINECTOMY/DECOMPRESSION MICRODISCECTOMY  05/31/2011   Procedure: LUMBAR LAMINECTOMY/DECOMPRESSION MICRODISCECTOMY;  Surgeon: Elaina Hoops, MD;  Location: [facility];  Service: Neurosurgery;  Laterality: Bilateral;  Bilateral Lumbar three-four decompressionj lumbar laminectomy   . THORACIC AORTIC ANEURYSM REPAIR  1/10   VALVE ALSO DONE  . THORACOABDOMINAL AORTIC ANEURYSM REPAIR  01/28/09   repair  . TONSILLECTOMY       OB History   None      Home Medications    Prior to Admission medications   Medication Sig Start Date End Date Taking? Authorizing Provider  acetaminophen (TYLENOL) 500 MG tablet Take 500 mg by mouth every 6 (six) hours as needed for  moderate pain.   Yes [provider]  ALPRAZolam Duanne Moron) 0.5 MG tablet Take 0.5 mg by mouth 3 (three) times daily.   Yes [provider]  ALPRAZolam Duanne Moron) 0.5 MG tablet Take 0.5 mg by mouth every 4 (four) hours as needed for anxiety (*May give 1 hour before or after scheduled dose).   Yes [provider]  aspirin EC 81 MG tablet Take 81 mg by mouth daily.   Yes [provider]  divalproex (DEPAKOTE ER) 250 MG 24 hr tablet Take 250 mg by mouth at bedtime. 08/22/17  Yes [provider]  donepezil (ARICEPT) 10 MG tablet TAKE 1 TABLET (10 MG TOTAL) BY MOUTH AT BEDTIME. 10/19/14  Yes Star Age, MD  furosemide (LASIX) 40 MG tablet Take 40 mg by mouth 2 (two) times daily.   Yes [provider]  ipratropium-albuterol (DUONEB) 0.5-2.5 (3) MG/3ML SOLN Take 3 mLs by nebulization 2 (two) times daily. Dx: J44.9 08/13/16  Yes Parrett, Tammy S, NP  levothyroxine (SYNTHROID, LEVOTHROID) 125 MCG tablet Take 125 mcg by mouth daily before breakfast.   Yes [provider]  memantine (NAMENDA) 10 MG tablet Take 10 mg by mouth 2 (two) times daily.  09/01/15  Yes [provider]  mometasone-formoterol (DULERA) 100-5 MCG/ACT AERO INHALE 2 PUFFS INTO THE LUNGS 2 TIMES DAILY. RINSE MOUTH AFTER EACH USE 09/17/15  Yes Parrett, Tammy S, NP  Multiple Vitamins-Minerals (CERTAVITE/ANTIOXIDANTS PO) Take 1 tablet by mouth daily.   Yes [provider]  predniSONE (DELTASONE) 5 MG tablet Take 5 mg by mouth daily with breakfast.   Yes [provider]  PROAIR HFA 108 (90 BASE) MCG/ACT inhaler Inhale 2 puffs into the lungs every 6 (six) hours as needed. Patient taking differently: Inhale 2 puffs into the lungs every 6 (six) hours as needed for wheezing or shortness of breath.  05/08/14  Yes Elsie Stain, MD  QUEtiapine (SEROQUEL) 25 MG tablet Take 12.5 mg by mouth at bedtime.   Yes [provider]  sertraline (ZOLOFT) 50 MG tablet Take  50-75 mg by mouth 2 (two) times daily. Takes 75 mg in the morning and 50 mg at bedtime. 07/19/13  Yes [provider]  atorvastatin (LIPITOR) 20 MG tablet TAKE 1 TABLET BY MOUTH EVERY DAY Patient not taking: Reported on 08/29/2017 09/20/14   Larey Dresser, MD  ibuprofen (ADVIL,MOTRIN) 200 MG tablet Take 2 tablets (400 mg total) by mouth daily as needed for moderate pain. Patient not taking: Reported on 08/29/2017 02/12/15   Florencia Reasons, MD  metoprolol tartrate (LOPRESSOR) 25 MG tablet Take 0.5 tablets (12.5 mg total) by mouth 2 (two) times daily. Patient not taking: Reported on 08/29/2017 02/12/15   Florencia Reasons, MD  oxyCODONE (OXY IR/ROXICODONE) 5 MG immediate release tablet Take 1-3 tablets (5-15 mg total) by mouth every 4 (four) hours as needed. Patient not taking: Reported on 08/29/2017 04/20/17   Leandrew Koyanagi, MD  pantoprazole ([place]) 40  MG tablet Take 1 tablet (40 mg total) by mouth daily at 6 (six) AM. Patient not taking: Reported on 08/29/2017 04/23/17   Donne Hazel, MD  saccharomyces boulardii (FLORASTOR) 250 MG capsule Take 1 capsule (250 mg total) by mouth 2 (two) times daily. Patient not taking: Reported on 08/29/2017 03/29/14   Robbie Lis, MD  thiamine 100 MG tablet Take 1 tablet (100 mg total) by mouth daily. Patient not taking: Reported on 08/29/2017 02/12/15   Florencia Reasons, MD    Family History Family History  Problem Relation Age of Onset  . Brain cancer Maternal Grandmother   . Coronary artery disease Unknown        no premature CAD    Social History Social History   Tobacco Use  . Smoking status: Former Smoker    Packs/day: 1.00    Years: 20.00    Pack years: 20.00    Types: Cigarettes    Last attempt to quit: 04/26/2004    Years since quitting: 13.3  . Smokeless tobacco: Never Used  . Tobacco comment: quit in 2006  Substance Use Topics  . Alcohol use: No  . Drug use: No     Allergies   Codeine and Penicillins   Review of Systems Review of Systems  All  other systems reviewed and are negative.    Physical Exam Updated Vital Signs BP 124/69 (BP Location: Right Leg)   Pulse 81   Temp 97.9 F (36.6 C) (Oral)   Resp 18   SpO2 98%   Physical Exam  Constitutional: She appears listless. No distress.  Elderly, frail  HENT:  Head: Normocephalic and atraumatic.  Right Ear: External ear normal.  Left Ear: External ear normal.  Eyes: Conjunctivae are normal. Right eye exhibits no discharge. Left eye exhibits no discharge. No scleral icterus.  Neck: Neck supple. No tracheal deviation present.  Cardiovascular: Normal rate, regular rhythm and intact distal pulses.  Pulmonary/Chest: Effort normal and breath sounds normal. No stridor. No respiratory distress. She has no wheezes. She has no rales.  Abdominal: Soft. Bowel sounds are normal. She exhibits no distension. There is no tenderness. There is no rebound and no guarding.  Musculoskeletal: She exhibits no edema or tenderness.  Small 2 cm blister on the right heel, mild erythema, no lymphangitic streaking, no increased warmth, small blister and early breakdown of the skin sacral region  Neurological: She appears listless. No cranial nerve deficit (no facial droop, extraocular movements intact, no slurred speech) or sensory deficit. She exhibits normal muscle tone. She displays no seizure activity. Coordination normal. GCS eye subscore is 4. GCS verbal subscore is 4. GCS motor subscore is 6.  Patient wakes up and answers questions, she is confused  Skin: Skin is warm and dry. No rash noted.  Psychiatric: She has a normal mood and affect.  Nursing note and vitals reviewed.    ED Treatments / Results  Labs (all labs ordered are listed, but only abnormal results are displayed) Labs Reviewed  COMPREHENSIVE METABOLIC PANEL - Abnormal; Notable for the following components:      Result Value   Chloride 93 (*)    Glucose, Bld 103 (*)    BUN 53 (*)    Creatinine, Ser 2.83 (*)    AST 44 (*)     GFR calc non Af Amer 15 (*)    GFR calc Af Amer 17 (*)    Anion gap 17 (*)    All other components within normal limits  CBC - Abnormal; Notable for the following components:   WBC 12.7 (*)    RDW 20.1 (*)    Platelets 133 (*)    All other components within normal limits  BLOOD GAS, ARTERIAL - Abnormal; Notable for the following components:   pO2, Arterial 71.4 (*)    Bicarbonate 32.5 (*)    Acid-Base Excess 7.5 (*)    All other components within normal limits  URINALYSIS, COMPLETE (UACMP) WITH MICROSCOPIC  CBG MONITORING, ED    EKG EKG Interpretation  Date/Time:  Monday Aug 29 2017 20:29:22 EDT Ventricular Rate:  82 PR Interval:    QRS Duration: 87 QT Interval:  398 QTC Calculation: 465 R Axis:   2 Text Interpretation:  Sinus or ectopic atrial rhythm Short PR interval Borderline T abnormalities, anterior leads No significant change since last tracing Confirmed by Dorie Rank [phone]) on 08/29/2017 8:41:24 PM   Radiology Dg Chest 2 View  Result Date: 08/29/2017 CLINICAL DATA:  84 year old female with COPD and altered mental status. EXAM: CHEST - 2 VIEW COMPARISON:  Chest radiograph dated 06/21/2017 FINDINGS: There is emphysematous changes of the lungs. Left lung base/retrocardiac densities may represent atelectatic changes/scarring. Infiltrate is not excluded. Clinical correlation is recommended. There is blunting of the left costophrenic angle which may represent scarring or small effusion. There is no pneumothorax. Stable top-normal cardiac size. There is prominence of the hila suggestive of pulmonary hypertension. There is osteopenia with degenerative changes of the spine. Midthoracic vertebroplasty changes noted. No acute osseous pathology. Median sternotomy wires. IMPRESSION: 1. Left lung base atelectasis/scarring versus infiltrate. Probable trace left pleural effusion. 2. Emphysema. Electronically Signed   By: Anner Crete M.D.   On: 08/29/2017 22:01   Ct Head Wo  Contrast  Result Date: 08/29/2017 CLINICAL DATA:  Altered mental status.  Lethargy. EXAM: CT HEAD WITHOUT CONTRAST TECHNIQUE: Contiguous axial images were obtained from the base of the skull through the vertex without intravenous contrast. COMPARISON:  Head CT, 04/17/2017 FINDINGS: Brain: No evidence of acute infarction, hemorrhage, hydrocephalus, extra-axial collection or mass lesion/mass effect. There is ventricular sulcal enlargement reflecting moderate generalized atrophy. Mild periventricular white matter hypoattenuation is noted consistent with chronic microvascular ischemic change. Vascular: No hyperdense vessel or unexpected calcification. Skull: Normal. Negative for fracture or focal lesion. Sinuses/Orbits: Visualized globes and orbits are unremarkable. Visualized sinuses and mastoid air cells are clear. Other: None. IMPRESSION: 1. No acute intracranial abnormalities. 2. Atrophy and mild chronic microvascular ischemic change. Findings similar to the prior CT. Electronically Signed   By: Lajean Manes M.D.   On: 08/29/2017 22:03    Procedures Procedures (including critical care time)  Medications Ordered in ED Medications  0.9 %  sodium chloride infusion (has no administration in time range)  cefTRIAXone (ROCEPHIN) 1 g in sodium chloride 0.9 % 100 mL IVPB (has no administration in time range)  azithromycin (ZITHROMAX) 500 mg in sodium chloride 0.9 % 250 mL IVPB (has no administration in time range)     Initial Impression / Assessment and Plan / ED Course  I have reviewed the triage vital signs and the nursing notes.  Pertinent labs & imaging results that were available during my care of the patient were reviewed by me and considered in my medical decision making (see chart for details).   Patient presents to the emergency room for evaluation of lethargy and increasing confusion.  Patient is ED work-up is notable for acute kidney injury.  The patient has an elevated BUN and creatinine  compared  to previous values.  She does have an elevated white blood cell count and abnormal chest x-ray .  the patient clinically has symptoms consistent with possible pneumonia.  Have ordered IV antibiotics.  I will consult with the medical service for admission. Final Clinical Impressions(s) / ED Diagnoses   Final diagnoses:  Pneumonia due to infectious organism, unspecified laterality, unspecified part of lung  AKI (acute kidney injury) ([place])       Dorie Rank, MD 08/29/17 2249

## 2017-08-30 ENCOUNTER — Other Ambulatory Visit: Payer: Self-pay

## 2017-08-30 DIAGNOSIS — F329 Major depressive disorder, single episode, unspecified: Secondary | ICD-10-CM

## 2017-08-30 DIAGNOSIS — F0390 Unspecified dementia without behavioral disturbance: Secondary | ICD-10-CM

## 2017-08-30 DIAGNOSIS — J441 Chronic obstructive pulmonary disease with (acute) exacerbation: Secondary | ICD-10-CM

## 2017-08-30 DIAGNOSIS — J9621 Acute and chronic respiratory failure with hypoxia: Secondary | ICD-10-CM

## 2017-08-30 DIAGNOSIS — L89159 Pressure ulcer of sacral region, unspecified stage: Secondary | ICD-10-CM | POA: Diagnosis present

## 2017-08-30 DIAGNOSIS — I5032 Chronic diastolic (congestive) heart failure: Secondary | ICD-10-CM

## 2017-08-30 LAB — INFLUENZA PANEL BY PCR (TYPE A & B)
INFLBPCR: NEGATIVE
Influenza A By PCR: NEGATIVE

## 2017-08-30 LAB — LACTIC ACID, PLASMA
Lactic Acid, Venous: 0.9 mmol/L (ref 0.5–1.9)
Lactic Acid, Venous: 0.9 mmol/L (ref 0.5–1.9)

## 2017-08-30 LAB — CREATININE, URINE, RANDOM: Creatinine, Urine: 142.88 mg/dL

## 2017-08-30 LAB — STREP PNEUMONIAE URINARY ANTIGEN: STREP PNEUMO URINARY ANTIGEN: NEGATIVE

## 2017-08-30 LAB — BRAIN NATRIURETIC PEPTIDE: B NATRIURETIC PEPTIDE 5: 36.7 pg/mL (ref 0.0–100.0)

## 2017-08-30 LAB — MRSA PCR SCREENING: MRSA by PCR: NEGATIVE

## 2017-08-30 LAB — HIV ANTIBODY (ROUTINE TESTING W REFLEX): HIV Screen 4th Generation wRfx: NONREACTIVE

## 2017-08-30 MED ORDER — SODIUM CHLORIDE 0.9 % IV SOLN
1.0000 g | Freq: Once | INTRAVENOUS | Status: DC
  Filled 2017-08-30: qty 1

## 2017-08-30 MED ORDER — IPRATROPIUM-ALBUTEROL 0.5-2.5 (3) MG/3ML IN SOLN: 3.0000 mL | RESPIRATORY_TRACT | Status: DC

## 2017-08-30 MED ORDER — VANCOMYCIN HCL 10 G IV SOLR
1500.0000 mg | Freq: Once | INTRAVENOUS | Status: AC
  Administered 2017-08-30: 1500 mg via INTRAVENOUS
  Filled 2017-08-30: qty 1500

## 2017-08-30 MED ORDER — IPRATROPIUM-ALBUTEROL 0.5-2.5 (3) MG/3ML IN SOLN: 3.0000 mL | RESPIRATORY_TRACT | Status: DC | PRN

## 2017-08-30 MED ORDER — IPRATROPIUM-ALBUTEROL 0.5-2.5 (3) MG/3ML IN SOLN
3.0000 mL | Freq: Three times a day (TID) | RESPIRATORY_TRACT | Status: DC
  Administered 2017-08-30 – 2017-09-01 (×6): 3 mL via RESPIRATORY_TRACT
  Filled 2017-08-30 (×7): qty 3

## 2017-08-30 MED ORDER — SODIUM CHLORIDE 0.9 % IV SOLN
500.0000 mg | Freq: Two times a day (BID) | INTRAVENOUS | Status: DC
  Administered 2017-08-30 – 2017-09-01 (×5): 500 mg via INTRAVENOUS
  Filled 2017-08-30 (×5): qty 500

## 2017-08-30 MED ORDER — ENOXAPARIN SODIUM 30 MG/0.3ML ~~LOC~~ SOLN
30.0000 mg | Freq: Every day | SUBCUTANEOUS | Status: DC
  Administered 2017-08-30 – 2017-09-02 (×4): 30 mg via SUBCUTANEOUS
  Filled 2017-08-30 (×4): qty 0.3

## 2017-08-30 NOTE — Consult Note (Signed)
[facility] Nurse wound consult note Reason for Consult: DTIs to right buttock and coccyx area Wound type: DTIs Pressure Injury POA: Yes Measurement: Coccyx area measures 3 cm x 1.3 cm x unknown depth.  Right buttock area measures 5 cm x 4 cm x unknown depth Wound bed: Wound beds are purple with intact epidermal layer.   Drainage (amount, consistency, odor) No drainage, no odor Periwound: Erythematous but blanches. Dressing procedure/placement/frequency: A sacral foam dressing was present at the time of my assessment in room 1411.  Continue the sacral foam dressing.  Add to the treatment regimen Criticaid Clear (available in the stock room in a purple and white tube).  This should be applied twice daily.  Also, will add an order for an air mattress and turning right or left and limit the amount of time in the supine position. Monitor the wound area(s) for worsening of condition such as: Signs/symptoms of infection,  Increase in size,  Development of or worsening of odor, Development of pain, or increased pain at the affected locations.  Notify the medical team if any of these develop.  Thank you for the consult.  Discussed plan of care with the patient and bedside nurse.  [facility] nurse will not follow at this time.  Please re-consult the [facility] team if needed.  Val Riles, RN, MSN, CWOCN, CNS-BC, pager [phone]

## 2017-08-30 NOTE — Progress Notes (Signed)
Pharmacy Antibiotic Note  Sarah Johnson is a 84 y.o. female admitted on 08/29/2017 with pneumonia.  Pharmacy has been consulted for Vancomycin, meropenem dosing.  Plan: Meropenem 1gm iv x1, then 0.5gm iv q12hr Vancomycin 1500mg  iv x1, then consider checking level after ~48hr  Will dose now, per levels and redose when vancomycin level < 20.   Height: 5\' 2"  (157.5 cm) Weight: 125 lb 7.1 oz (56.9 kg) IBW/kg (Calculated) : 50.1  Temp (24hrs), Avg:97.7 F (36.5 C), Min:97.4 F (36.3 C), Max:97.9 F (36.6 C)  Recent Labs  Lab 08/29/17 2004 08/30/17 0541  WBC 12.7*  --   CREATININE 2.83*  --   LATICACIDVEN  --  0.9    Estimated Creatinine Clearance: 12.1 mL/min (A) (by C-G formula based on SCr of 2.83 mg/dL (H)).    Allergies  Allergen Reactions  . Codeine Other (See Comments)    Pt. States it has been too long to remember the reaction   . Penicillins Other (See Comments)    Pt. Does not recall the reaction.  Has patient had a PCN reaction causing immediate rash, facial/tongue/throat swelling, SOB or lightheadedness with hypotension:  Has patient had a PCN reaction causing severe rash involving mucus membranes or skin necrosis: Has patient had a PCN reaction that required hospitalization  Has patient had a PCN reaction occurring within the last 10 years:  If all of the above answers are "NO", then may proceed with Cephalosporin use.     Antimicrobials this admission: Vancomycin 08/30/2017 >> Meropenem 08/30/2017 >>   Dose adjustments this admission: -  Microbiology results: -  Thank you for allowing pharmacy to be a part of this patient's care.  Sarah Johnson 08/30/2017 7:16 AM

## 2017-08-30 NOTE — Progress Notes (Signed)
PROGRESS NOTE    Sarah Johnson  KAJ:[id number] DOB: Feb 16, 1933 DOA: 08/29/2017 PCP: Jani Gravel, MD   Brief Narrative:  Admitted for altered mental status with acute respiratory failure and pneumonia.  Assessment & Plan   Acute on chronic respiratory failure with hypoxia/hypercapnia/ COPD exacerbation -possible secondary to pneumonia  -patient's daughter states she was told her mother is a CO2 retainer by pulmonology and should be on 2L at all times -Upon admission, ABG showed pH 7.445, PCO2 47.9, PO2 71.4 -CXR reviewed, showing possible infiltrate left lung base -influenza PCR negative -Patient was started on broad-spectrum antibiotics -Continue supplemental oxygen to maintain saturations above 92%, Solu-Medrol, nebulizer treatments  Acute metabolic encephalopathy -possibly secondary to hypoxia and pneumonia -Discussed with daughter, mildly improved -It seems that patient was lethargic upon arrival to the emergency department  Possible Pneumonia -CXR as Above -Patient placed on broad-spectrum antibiotics with vancomycin and Merrem for possible aspiration pneumonia versus HCAP (patient with penicillin allergy) -Strep pneumonia urine antigen negative -pending legionella urine antigen -Currently n.p.o., have consulted speech for possible aspiration  Hyperlipidemia -statin held  Hypertension -BP was mildly soft on admission, currently normotensive -Home medications held  GERD -Continue IV famotidine  Acute kidney injury on chronic kidney disease, stage III -Creatinine on admission 2.83 (last creatinine in March 2019 was 2.31) -baseline creatine appears to be 1.3-1.5 -Will continue IVF and monitor BMP  Hypothyroidism -Continue Synthroid, IV form  Dementia -home meds hold -currently on Ativan for agitation  Depression -home medications held due to lethargy   Pessure ulcer -wound care consulted   DVT Prophylaxis  lovenox  Code Status: DNR  Family  Communication: None at bedside, daughter via hone  Disposition Plan: admitted, will likely be discharged back to SNF when stable (of note, patient's family would like for her to go to [facility] instead of [place]... Social work consulted)  Consultants None  Procedures  none  Antibiotics   Anti-infectives (From admission, onward)   Start     Dose/Rate Route Frequency Ordered Stop   08/30/17 1000  meropenem (MERREM) 500 mg in sodium chloride 0.9 % 100 mL IVPB     500 mg 200 mL/hr over 30 Minutes Intravenous Every 12 hours 08/30/17 0722     08/30/17 0015  meropenem (MERREM) 1 g in sodium chloride 0.9 % 100 mL IVPB     1 g 200 mL/hr over 30 Minutes Intravenous  Once 08/30/17 0009     08/30/17 0015  vancomycin (VANCOCIN) 1,500 mg in sodium chloride 0.9 % 500 mL IVPB     1,500 mg 250 mL/hr over 120 Minutes Intravenous  Once 08/30/17 0009 08/30/17 0455   08/29/17 2300  cefTRIAXone (ROCEPHIN) 1 g in sodium chloride 0.9 % 100 mL IVPB  Status:  Discontinued     1 g 200 mL/hr over 30 Minutes Intravenous  Once 08/29/17 2246 08/29/17 2331   08/29/17 2300  azithromycin (ZITHROMAX) 500 mg in sodium chloride 0.9 % 250 mL IVPB  Status:  Discontinued     500 mg 250 mL/hr over 60 Minutes Intravenous  Once 08/29/17 2246 08/29/17 2331      Subjective:   Sarah Johnson seen and examined today.  Patient with dementia. Does not like having mittens on her hands. Promises not to pull anything else out or off.  Objective:   Vitals:   08/30/17 0430 08/30/17 0510 08/30/17 0859 08/30/17 1314  BP: (!) 102/52 120/63  115/60  Pulse: 64 63 63 62  Resp: 13 16 16  14  Temp:  (!) 97.4 F (36.3 C)  97.7 F (36.5 C)  TempSrc:  Oral  Oral  SpO2: 97% 96% 93% 92%  Weight:  56.9 kg (125 lb 7.1 oz)    Height:  5\' 2"  (1.575 m)      Intake/Output Summary (Last 24 hours) at 08/30/2017 1351 Last data filed at 08/30/2017 1300 Gross per 24 hour  Intake 554.17 ml  Output 250 ml  Net 304.17 ml   Filed Weights    08/30/17 0510  Weight: 56.9 kg (125 lb 7.1 oz)    Exam  General: Well developed, well nourished, NAD, appears stated age  HEENT: NCAT, mucous membranes moist.   Neck: Supple  Cardiovascular: S1 S2 auscultated, no rubs, murmurs or gallops. Regular rate and rhythm.  Respiratory: Diminished breath sounds, fine crackles  Abdomen: Soft, nontender, nondistended, + bowel sounds  Extremities: warm dry without cyanosis clubbing or edema  Neuro: AAOx1 (self), nonfocal  Psych: pleasantly confused   Data Reviewed: I have personally reviewed following labs and imaging studies  CBC: Recent Labs  Lab 08/29/17 2004  WBC 12.7*  HGB 14.3  HCT 42.9  MCV 93.3  PLT 924*   Basic Metabolic Panel: Recent Labs  Lab 08/29/17 2004  NA 141  K 3.9  CL 93*  CO2 31  GLUCOSE 103*  BUN 53*  CREATININE 2.83*  CALCIUM 10.1   GFR: Estimated Creatinine Clearance: 12.1 mL/min (A) (by C-G formula based on SCr of 2.83 mg/dL (H)). Liver Function Tests: Recent Labs  Lab 08/29/17 2004  AST 44*  ALT 19  ALKPHOS 61  BILITOT 0.4  PROT 7.5  ALBUMIN 3.6   No results for input(s): LIPASE, AMYLASE in the last 168 hours. No results for input(s): AMMONIA in the last 168 hours. Coagulation Profile: No results for input(s): INR, PROTIME in the last 168 hours. Cardiac Enzymes: No results for input(s): CKTOTAL, CKMB, CKMBINDEX, TROPONINI in the last 168 hours. BNP (last 3 results) No results for input(s): PROBNP in the last 8760 hours. HbA1C: No results for input(s): HGBA1C in the last 72 hours. CBG: Recent Labs  Lab 08/29/17 2017  GLUCAP 97   Lipid Profile: No results for input(s): CHOL, HDL, LDLCALC, TRIG, CHOLHDL, LDLDIRECT in the last 72 hours. Thyroid Function Tests: No results for input(s): TSH, T4TOTAL, FREET4, T3FREE, THYROIDAB in the last 72 hours. Anemia Panel: No results for input(s): VITAMINB12, FOLATE, FERRITIN, TIBC, IRON, RETICCTPCT in the last 72 hours. Urine analysis:     Component Value Date/Time   COLORURINE STRAW (A) 08/29/2017 2230   APPEARANCEUR CLEAR 08/29/2017 2230   LABSPEC 1.003 (L) 08/29/2017 2230   PHURINE 6.0 08/29/2017 2230   GLUCOSEU NEGATIVE 08/29/2017 2230   HGBUR MODERATE (A) 08/29/2017 2230   BILIRUBINUR NEGATIVE 08/29/2017 2230   KETONESUR 80 (A) 08/29/2017 2230   PROTEINUR NEGATIVE 08/29/2017 2230   UROBILINOGEN 0.2 02/04/2015 2036   NITRITE NEGATIVE 08/29/2017 2230   LEUKOCYTESUR NEGATIVE 08/29/2017 2230   Sepsis Labs: @LABRCNTIP (procalcitonin:4,lacticidven:4)  ) Recent Results (from the past 240 hour(s))  MRSA PCR Screening     Status: None   Collection Time: 08/30/17  7:01 AM  Result Value Ref Range Status   MRSA by PCR NEGATIVE NEGATIVE Final    Comment:        The GeneXpert MRSA Assay (FDA approved for NASAL specimens only), is one component of a comprehensive MRSA colonization surveillance program. It is not intended to diagnose MRSA infection nor to guide or monitor treatment for MRSA infections.  Performed at [facility], [place] [address] [place], [place] [address]       Radiology Studies: Dg Chest 2 View  Result Date: 08/29/2017 CLINICAL DATA:  84 year old female with COPD and altered mental status. EXAM: CHEST - 2 VIEW COMPARISON:  Chest radiograph dated 06/21/2017 FINDINGS: There is emphysematous changes of the lungs. Left lung base/retrocardiac densities may represent atelectatic changes/scarring. Infiltrate is not excluded. Clinical correlation is recommended. There is blunting of the left costophrenic angle which may represent scarring or small effusion. There is no pneumothorax. Stable top-normal cardiac size. There is prominence of the hila suggestive of pulmonary hypertension. There is osteopenia with degenerative changes of the spine. Midthoracic vertebroplasty changes noted. No acute osseous pathology. Median sternotomy wires. IMPRESSION: 1. Left lung base atelectasis/scarring versus  infiltrate. Probable trace left pleural effusion. 2. Emphysema. Electronically Signed   By: Anner Crete M.D.   On: 08/29/2017 22:01   Ct Head Wo Contrast  Result Date: 08/29/2017 CLINICAL DATA:  Altered mental status.  Lethargy. EXAM: CT HEAD WITHOUT CONTRAST TECHNIQUE: Contiguous axial images were obtained from the base of the skull through the vertex without intravenous contrast. COMPARISON:  Head CT, 04/17/2017 FINDINGS: Brain: No evidence of acute infarction, hemorrhage, hydrocephalus, extra-axial collection or mass lesion/mass effect. There is ventricular sulcal enlargement reflecting moderate generalized atrophy. Mild periventricular white matter hypoattenuation is noted consistent with chronic microvascular ischemic change. Vascular: No hyperdense vessel or unexpected calcification. Skull: Normal. Negative for fracture or focal lesion. Sinuses/Orbits: Visualized globes and orbits are unremarkable. Visualized sinuses and mastoid air cells are clear. Other: None. IMPRESSION: 1. No acute intracranial abnormalities. 2. Atrophy and mild chronic microvascular ischemic change. Findings similar to the prior CT. Electronically Signed   By: Lajean Manes M.D.   On: 08/29/2017 22:03     Scheduled Meds: . enoxaparin (LOVENOX) injection  30 mg Subcutaneous Daily  . ipratropium-albuterol  3 mL Nebulization TID  . levothyroxine  50 mcg Intravenous Daily  . methylPREDNISolone (SOLU-MEDROL) injection  60 mg Intravenous BID   Continuous Infusions: . sodium chloride 125 mL/hr at 08/30/17 0134  . sodium chloride     Followed by  . sodium chloride 1,000 mL (08/30/17 0126)  . famotidine (PEPCID) IV Stopped (08/30/17 1005)  . meropenem (MERREM) IV    . meropenem (MERREM) IV Stopped (08/30/17 1142)     LOS: 1 day   Time Spent in minutes   40 minutes (greater than 50% of time spent with patient face to face, as well as reviewing old records, calling and updating patient's family, and formulating a  plan)   Laydon Martis D.O. on 08/30/2017 at 1:51 PM  Between 7am to 7pm - Pager - [phone]  After 7pm go to [email or web address] - password [email or web address]  And look for the night coverage person covering for me after hours  [facility] Office  [phone]

## 2017-08-30 NOTE — Evaluation (Signed)
Clinical/Bedside Swallow Evaluation Patient Details  Name: Sarah Johnson MRN: [id number] Date of Birth: [birth year]  Today's Date: 08/30/2017 Time: SLP Start Time (ACUTE ONLY): 1300 SLP Stop Time (ACUTE ONLY): 1320 SLP Time Calculation (min) (ACUTE ONLY): 20 min  Past Medical History:  Past Medical History:  Diagnosis Date  . Arthritis   . Blind right eye   . Cataract    RT  . CKD (chronic kidney disease)   . COPD (chronic obstructive pulmonary disease) ([place])   . Emphysema    followed by Dr. Joya Gaskins  . GERD (gastroesophageal reflux disease)   . History of Doppler echocardiogram    carotid dopplers. (8/11) no significant stenosis  . HTN (hypertension)   . Hx of echocardiogram 2015   Echo (08/2013):  EF 60-65%, no RWMA, Gr 1 DD, AVR ok (mean 5 mmHg), mild MR, PASP 22 mmHg  . Hyperlipidemia   . Hypothyroidism   . Pulmonary embolism ([name]) 2010   She is no onger taking coumadin  . S/P aortic valve replacement with bioprosthetic valve    ehco (8/11) with EF 55-60%, mild-mod MR, mild-mod TR, poorly visualized bioprosthetic aortic valve but no significant regurgitation and gradient not significanly elevated.    . S/P thoracic aortic aneurysm repair 1/10   also with bioprosthetic aortic valve prelacement [facility]). in 10/10 she has a descending thoraic aorta anuerysm and abdominal reapir [facility]). Vascular urgeon is Dr. Lunette Stands.   Marland Kitchen SVT (supraventricular tachycardia) ([facility])    HOLTER MONITOR AFTER SURGERY    Past Surgical History:  Past Surgical History:  Procedure Laterality Date  . ANOMALOUS PULMONARY VENOUS RETURN REPAIR    . BACK SURGERY  1970   . EYE SURGERY     AS BABY- RT EYE  POOR SIGHT  . INTRAMEDULLARY (IM) NAIL INTERTROCHANTERIC Right 04/20/2017   Procedure: INTRAMEDULLARY (IM) NAIL INTERTROCHANTRIC;  Surgeon: Leandrew Koyanagi, MD;  Location: [place];  Service: Orthopedics;  Laterality: Right;  . LUMBAR LAMINECTOMY/DECOMPRESSION MICRODISCECTOMY  05/31/2011   Procedure:  LUMBAR LAMINECTOMY/DECOMPRESSION MICRODISCECTOMY;  Surgeon: Elaina Hoops, MD;  Location: [place] [facility];  Service: Neurosurgery;  Laterality: Bilateral;  Bilateral Lumbar three-four decompressionj lumbar laminectomy   . THORACIC AORTIC ANEURYSM REPAIR  1/10   VALVE ALSO DONE  . THORACOABDOMINAL AORTIC ANEURYSM REPAIR  01/28/09   repair  . TONSILLECTOMY     HPI:  84 yo female adm to [facility] respiratory deficits  - PMH + for CXR 08/29/2017 ? pna - left lung base ATX/scarring vs infiltrate - left pleural effusion, emphysema.  CT showed atrophy 08/29/2017.   MBS 06/29/2017 normal op swallow - flash penetration, concern for risk for post=prandial aspiration - pt could not swallow tablet.     Assessment / Plan / Recommendation Clinical Impression  Pt presents with altered mentation at this time which is prohibiting po intake safely.  She did awaken with sternal rubbing well enough to accept minimal intake without indication of airway compromise.  No focal CN deficits noted during evaluation.  Recommend medicine with puree and single ice chips.   Will follow up for readiness for po diet - recommend consider esophagram during hospital coarse.  Pt noted to have esophageal dysmotility on recent MBS 06/28/17.     SLP Visit Diagnosis: Dysphagia, pharyngoesophageal phase (R13.14);Dysphagia, unspecified (R13.10)    Aspiration Risk  Mild aspiration risk    Diet Recommendation Other (Comment);NPO except meds;Ice chips PRN after oral care        Other  Recommendations Oral  Care Recommendations: Oral care QID   Follow up Recommendations None      Frequency and Duration min 2x/week  2 weeks       Prognosis Prognosis for Safe Diet Advancement: Fair      Swallow Study   General Date of Onset: 08/30/17 HPI: 84 yo female adm to [facility] respiratory deficits  - PMH + for CXR 08/29/2017 ? pna - left lung base ATX/scarring vs infiltrate - left pleural effusion, emphysema.  CT showed atrophy 08/29/2017.   MBS 06/29/2017 normal op  swallow - flash penetration, concern for risk for post=prandial aspiration - pt could not swallow tablet.   Type of Study: Bedside Swallow Evaluation Diet Prior to this Study: NPO Temperature Spikes Noted: No Respiratory Status: Nasal cannula History of Recent Intubation: No Behavior/Cognition: Requires cueing;Doesn't follow directions;Lethargic/Drowsy Oral Cavity Assessment: Within Functional Limits Oral Care Completed by SLP: Yes Oral Cavity - Dentition: Adequate natural dentition Vision: Impaired for self-feeding Self-Feeding Abilities: Total assist Patient Positioning: Upright in bed Baseline Vocal Quality: Low vocal intensity Volitional Cough: Weak Volitional Swallow: Unable to elicit    Oral/Motor/Sensory Function Overall Oral Motor/Sensory Function: Mild impairment   Ice Chips Ice chips: Within functional limits Presentation: Spoon   Thin Liquid Thin Liquid: Not tested Other Comments: due to mentation    Nectar Thick Nectar Thick Liquid: Impaired Presentation: Spoon;Straw Oral Phase Impairments: Reduced labial seal Oral phase functional implications: (presumed delay oral transiting) Pharyngeal Phase Impairments: Suspected delayed Swallow   Honey Thick Honey Thick Liquid: Not tested   Puree Puree: Within functional limits Presentation: Self Fed;Spoon   Solid   GO   Solid: Not tested        Macario Golds 08/30/2017,2:10 PM  Luanna Salk, Normal [facility] SLP [phone]

## 2017-08-30 NOTE — ED Notes (Signed)
ED TO INPATIENT HANDOFF REPORT  Name/Age/Gender Sarah Johnson 84 y.o. female  Code Status    Code Status Orders  (From admission, onward)        Start     Ordered   08/29/17 2335  Do not attempt resuscitation (DNR)  Continuous    Question Answer Comment  In the event of cardiac or respiratory ARREST Do not call a "code blue"   In the event of cardiac or respiratory ARREST Do not perform Intubation, CPR, defibrillation or ACLS   In the event of cardiac or respiratory ARREST Use medication by any route, position, wound care, and other measures to relive pain and suffering. May use oxygen, suction and manual treatment of airway obstruction as needed for comfort.   Comments patient preference      08/29/17 2335    Code Status History    Date Active Date Inactive Code Status Order ID Comments User Context   04/18/2017 0226 04/22/2017 2011 DNR [id number]  Vilma Prader, MD Inpatient   11/28/2015 1525 12/01/2015 1730 Full Code [id number]  Jonetta Osgood, MD Inpatient   02/04/2015 2048 02/12/2015 2006 Full Code [id number]  Allyne Gee, MD Inpatient   06/29/2014 0314 07/05/2014 1508 Full Code [id number]  Toy Baker, MD Inpatient   03/26/2014 0203 03/31/2014 1537 Full Code [id number]  Rama, Venetia Maxon, MD Inpatient   04/21/2013 2301 04/26/2013 1610 Full Code [id number]  Orvan Falconer, MD Inpatient      Home/SNF/Other Home  Chief Complaint Lethargic/ bedsores   Level of Care/Admitting Diagnosis ED Disposition    ED Disposition Condition Comment   [facility]: [facility] [[id number]]  Level of Care: Telemetry [5]  Admit to tele based on following criteria: Other see comments  Comments: chf  Diagnosis: HCAP (healthcare-associated pneumonia) [119417]  Admitting Physician: Ivor Costa [place]  Attending Physician: Ivor Costa [phone]  Estimated length of stay: past midnight tomorrow  Certification:: I certify this patient will need inpatient services for  at least 2 midnights  PT Class (Do Not Modify): Inpatient [101]  PT Acc Code (Do Not Modify): Private [1]       Medical History Past Medical History:  Diagnosis Date  . Arthritis   . Blind right eye   . Cataract    RT  . CKD (chronic kidney disease)   . COPD (chronic obstructive pulmonary disease) ([place])   . Emphysema    followed by Dr. Joya Gaskins  . GERD (gastroesophageal reflux disease)   . History of Doppler echocardiogram    carotid dopplers. (8/11) no significant stenosis  . HTN (hypertension)   . Hx of echocardiogram 2015   Echo (08/2013):  EF 60-65%, no RWMA, Gr 1 DD, AVR ok (mean 5 mmHg), mild MR, PASP 22 mmHg  . Hyperlipidemia   . Hypothyroidism   . Pulmonary embolism ([place]) 2010   She is no onger taking coumadin  . S/P aortic valve replacement with bioprosthetic valve    ehco (8/11) with EF 55-60%, mild-mod MR, mild-mod TR, poorly visualized bioprosthetic aortic valve but no significant regurgitation and gradient not significanly elevated.    . S/P thoracic aortic aneurysm repair 1/10   also with bioprosthetic aortic valve prelacement [facility]). in 10/10 she has a descending thoraic aorta anuerysm and abdominal reapir [facility]). Vascular urgeon is Dr. Lunette Stands.   Marland Kitchen SVT (supraventricular tachycardia) (HCC)    HOLTER MONITOR AFTER SURGERY     Allergies Allergies  Allergen Reactions  . Codeine  Other (See Comments)    Pt. States it has been too long to remember the reaction   . Penicillins Other (See Comments)    Pt. Does not recall the reaction.  Has patient had a PCN reaction causing immediate rash, facial/tongue/throat swelling, SOB or lightheadedness with hypotension:  Has patient had a PCN reaction causing severe rash involving mucus membranes or skin necrosis: Has patient had a PCN reaction that required hospitalization  Has patient had a PCN reaction occurring within the last 10 years:  If all of the above answers are "NO", then may proceed with  Cephalosporin use.     IV Location/Drains/Wounds Patient Lines/Drains/Airways Status   Active Line/Drains/Airways    Name:   Placement date:   Placement time:   Site:   Days:   Peripheral IV 08/30/17 Right Forearm   08/30/17    0126    Forearm   less than 1   Incision (Closed) 04/20/17 Hip Right   04/20/17    1512     132          Labs/Imaging Results for orders placed or performed during the hospital encounter of 08/29/17 (from the past 48 hour(s))  Comprehensive metabolic panel     Status: Abnormal   Collection Time: 08/29/17  8:04 PM  Result Value Ref Range   Sodium 141 135 - 145 mmol/L   Potassium 3.9 3.5 - 5.1 mmol/L   Chloride 93 (L) 101 - 111 mmol/L   CO2 31 22 - 32 mmol/L   Glucose, Bld 103 (H) 65 - 99 mg/dL   BUN 53 (H) 6 - 20 mg/dL   Creatinine, Ser 2.83 (H) 0.44 - 1.00 mg/dL   Calcium 10.1 8.9 - 10.3 mg/dL   Total Protein 7.5 6.5 - 8.1 g/dL   Albumin 3.6 3.5 - 5.0 g/dL   AST 44 (H) 15 - 41 U/L   ALT 19 14 - 54 U/L   Alkaline Phosphatase 61 38 - 126 U/L   Total Bilirubin 0.4 0.3 - 1.2 mg/dL   GFR calc non Af Amer 15 (L) >60 mL/min   GFR calc Af Amer 17 (L) >60 mL/min    Comment: (NOTE) The eGFR has been calculated using the CKD EPI equation. This calculation has not been validated in all clinical situations. eGFR's persistently <60 mL/min signify possible Chronic Kidney Disease.    Anion gap 17 (H) 5 - 15    Comment: Performed at [facility], [place] [address], [place], [place] [address]  CBC     Status: Abnormal   Collection Time: 08/29/17  8:04 PM  Result Value Ref Range   WBC 12.7 (H) 4.0 - 10.5 K/uL   RBC 4.60 3.87 - 5.11 MIL/uL   Hemoglobin 14.3 12.0 - 15.0 g/dL   HCT 42.9 36.0 - 46.0 %   MCV 93.3 78.0 - 100.0 fL   MCH 31.1 26.0 - 34.0 pg   MCHC 33.3 30.0 - 36.0 g/dL   RDW 20.1 (H) 11.5 - 15.5 %   Platelets 133 (L) 150 - 400 K/uL    Comment: REPEATED TO VERIFY SPECIMEN CHECKED FOR CLOTS PLATELET COUNT CONFIRMED BY  SMEAR Performed at [facility] [address], [place], [place] [address]   CBG monitoring, ED     Status: None   Collection Time: 08/29/17  8:17 PM  Result Value Ref Range   Glucose-Capillary 97 65 - 99 mg/dL   Comment 1 Notify RN   Blood gas, arterial (  [facility] & [facility] ONLY)     Status: Abnormal   Collection Time: 08/29/17  9:06 PM  Result Value Ref Range   O2 Content 3.0 L/min   Delivery systems NASAL CANNULA    pH, Arterial 7.445 7.350 - 7.450   pCO2 arterial 47.9 32.0 - 48.0 mmHg   pO2, Arterial 71.4 (L) 83.0 - 108.0 mmHg   Bicarbonate 32.5 (H) 20.0 - 28.0 mmol/L   Acid-Base Excess 7.5 (H) 0.0 - 2.0 mmol/L   O2 Saturation 93.9 %   Patient temperature 97.9    Collection site RIGHT RADIAL    Drawn by [id number]    Sample type ARTERIAL DRAW    Allens test (pass/fail) PASS PASS    Comment: Performed at [facility], [place] [address] [place], [place] [address]  Urinalysis, Complete w Microscopic     Status: Abnormal   Collection Time: 08/29/17 10:30 PM  Result Value Ref Range   Color, Urine STRAW (A) YELLOW   APPearance CLEAR CLEAR   Specific Gravity, Urine 1.003 (L) 1.005 - 1.030   pH 6.0 5.0 - 8.0   Glucose, UA NEGATIVE NEGATIVE mg/dL   Hgb urine dipstick MODERATE (A) NEGATIVE   Bilirubin Urine NEGATIVE NEGATIVE   Ketones, ur 80 (A) NEGATIVE mg/dL   Protein, ur NEGATIVE NEGATIVE mg/dL   Nitrite NEGATIVE NEGATIVE   Leukocytes, UA NEGATIVE NEGATIVE    Comment: Performed at [place] [facility], [place] [address], [place], [place] [address]   Dg Chest 2 View  Result Date: 08/29/2017 CLINICAL DATA:  84 year old female with COPD and altered mental status. EXAM: CHEST - 2 VIEW COMPARISON:  Chest radiograph dated 06/21/2017 FINDINGS: There is emphysematous changes of the lungs. Left lung base/retrocardiac densities may represent atelectatic changes/scarring. Infiltrate is not excluded. Clinical correlation is recommended. There is blunting of the  left costophrenic angle which may represent scarring or small effusion. There is no pneumothorax. Stable top-normal cardiac size. There is prominence of the hila suggestive of pulmonary hypertension. There is osteopenia with degenerative changes of the spine. Midthoracic vertebroplasty changes noted. No acute osseous pathology. Median sternotomy wires. IMPRESSION: 1. Left lung base atelectasis/scarring versus infiltrate. Probable trace left pleural effusion. 2. Emphysema. Electronically Signed   By: Anner Crete M.D.   On: 08/29/2017 22:01   Ct Head Wo Contrast  Result Date: 08/29/2017 CLINICAL DATA:  Altered mental status.  Lethargy. EXAM: CT HEAD WITHOUT CONTRAST TECHNIQUE: Contiguous axial images were obtained from the base of the skull through the vertex without intravenous contrast. COMPARISON:  Head CT, 04/17/2017 FINDINGS: Brain: No evidence of acute infarction, hemorrhage, hydrocephalus, extra-axial collection or mass lesion/mass effect. There is ventricular sulcal enlargement reflecting moderate generalized atrophy. Mild periventricular white matter hypoattenuation is noted consistent with chronic microvascular ischemic change. Vascular: No hyperdense vessel or unexpected calcification. Skull: Normal. Negative for fracture or focal lesion. Sinuses/Orbits: Visualized globes and orbits are unremarkable. Visualized sinuses and mastoid air cells are clear. Other: None. IMPRESSION: 1. No acute intracranial abnormalities. 2. Atrophy and mild chronic microvascular ischemic change. Findings similar to the prior CT. Electronically Signed   By: Lajean Manes M.D.   On: 08/29/2017 22:03    Pending Labs Unresulted Labs (From admission, onward)   Start     Ordered   08/29/17 2340  Influenza panel by PCR (type A & B)  Once,   R     08/29/17 2339   08/29/17 2335  Legionella Pneumophila Serogp 1 Ur Ag  Once,   R  08/29/17 2335   08/29/17 2334  Culture, blood (Routine X 2) w Reflex to ID Panel  BLOOD  CULTURE X 2,   R    Comments:  Please obtain prior to antibiotic administration.    08/29/17 2333   08/29/17 2334  Culture, sputum-assessment  Once,   R     08/29/17 2335   08/29/17 2334  Gram stain  Once,   R     08/29/17 2335   08/29/17 2334  HIV antibody (Routine Screening)  Once,   R     08/29/17 2335   08/29/17 2334  Strep pneumoniae urinary antigen  Once,   R     08/29/17 2335   08/29/17 2333  Lactic acid, plasma  STAT Now then every 3 hours,   R     08/29/17 2332   08/29/17 2333  Creatinine, urine, random  Once,   R     08/29/17 2332   08/29/17 2333  Urea nitrogen, urine  Once,   R     08/29/17 2332   08/29/17 2331  Brain natriuretic peptide  STAT,   R     08/29/17 2330      Vitals/Pain Today's Vitals   08/29/17 2006 08/30/17 0049 08/30/17 0138 08/30/17 0410  BP: 124/69  109/63 (!) 102/50  Pulse: 81  75 65  Resp: _0 Temp: 97.9 F (36.6 C)     TempSrc: Oral     SpO2: 98% 93% 94% 97%  PainSc: 0-No pain       Isolation Precautions No active isolations  Medications Medications  0.9 %  sodium chloride infusion ( Intravenous Transfusing/Transfer 08/30/17 0455)  sodium chloride 0.9 % bolus 500 mL (has no administration in time range)    Followed by  0.9 %  sodium chloride infusion (1,000 mLs Intravenous New Bag/Given 08/30/17 0126)  enoxaparin (LOVENOX) injection 40 mg (has no administration in time range)  LORazepam (ATIVAN) injection 0.25 mg (has no administration in time range)  levothyroxine (SYNTHROID, LEVOTHROID) injection 50 mcg (has no administration in time range)  famotidine (PEPCID) IVPB 20 mg premix (has no administration in time range)  methylPREDNISolone sodium succinate (SOLU-MEDROL) 125 mg/2 mL injection 60 mg (has no administration in time range)  albuterol (PROVENTIL) (2.5 MG/3ML) 0.083% nebulizer solution 2.5 mg (has no administration in time range)  meropenem (MERREM) 1 g in sodium chloride 0.9 % 100 mL IVPB (has no administration in time  range)  ipratropium-albuterol (DUONEB) 0.5-2.5 (3) MG/3ML nebulizer solution 3 mL (has no administration in time range)  vancomycin (VANCOCIN) 1,500 mg in sodium chloride 0.9 % 500 mL IVPB (0 mg Intravenous Stopped 08/30/17 0455)    Mobility walks

## 2017-08-31 ENCOUNTER — Other Ambulatory Visit: Payer: Self-pay

## 2017-08-31 ENCOUNTER — Encounter (HOSPITAL_COMMUNITY): Payer: Self-pay

## 2017-08-31 ENCOUNTER — Inpatient Hospital Stay (HOSPITAL_COMMUNITY): Payer: Medicare Other

## 2017-08-31 LAB — BASIC METABOLIC PANEL
Anion gap: 8 (ref 5–15)
BUN: 36 mg/dL — ABNORMAL HIGH (ref 6–20)
CALCIUM: 8.8 mg/dL — AB (ref 8.9–10.3)
CO2: 26 mmol/L (ref 22–32)
CREATININE: 1.62 mg/dL — AB (ref 0.44–1.00)
Chloride: 109 mmol/L (ref 101–111)
GFR calc non Af Amer: 28 mL/min — ABNORMAL LOW (ref >60)
GFR, EST AFRICAN AMERICAN: 33 mL/min — AB (ref >60)
Glucose, Bld: 100 mg/dL — ABNORMAL HIGH (ref 65–99)
Potassium: 4.1 mmol/L (ref 3.5–5.1)
SODIUM: 143 mmol/L (ref 135–145)

## 2017-08-31 LAB — LEGIONELLA PNEUMOPHILA SEROGP 1 UR AG: L. PNEUMOPHILA SEROGP 1 UR AG: NEGATIVE

## 2017-08-31 LAB — CBC
HCT: 33.9 % — ABNORMAL LOW (ref 36.0–46.0)
Hemoglobin: 10.6 g/dL — ABNORMAL LOW (ref 12.0–15.0)
MCH: 29.9 pg (ref 26.0–34.0)
MCHC: 31.3 g/dL (ref 30.0–36.0)
MCV: 95.5 fL (ref 78.0–100.0)
PLATELETS: 156 10*3/uL (ref 150–400)
RBC: 3.55 MIL/uL — AB (ref 3.87–5.11)
RDW: 20 % — AB (ref 11.5–15.5)
WBC: 5.5 10*3/uL (ref 4.0–10.5)

## 2017-08-31 LAB — PHOSPHORUS: Phosphorus: 4.2 mg/dL (ref 2.5–4.6)

## 2017-08-31 LAB — MAGNESIUM: Magnesium: 2 mg/dL (ref 1.7–2.4)

## 2017-08-31 LAB — UREA NITROGEN, URINE: Urea Nitrogen, Ur: 642 mg/dL

## 2017-08-31 MED ORDER — SERTRALINE HCL 50 MG PO TABS: 50.0000 mg | ORAL_TABLET | Freq: Two times a day (BID) | ORAL | Status: DC

## 2017-08-31 MED ORDER — DIVALPROEX SODIUM ER 250 MG PO TB24
250.0000 mg | ORAL_TABLET | Freq: Every day | ORAL | Status: DC
  Administered 2017-08-31 – 2017-09-01 (×2): 250 mg via ORAL
  Filled 2017-08-31 (×2): qty 1

## 2017-08-31 MED ORDER — SODIUM CHLORIDE 0.9 % IV SOLN: 1000.0000 mL | INTRAVENOUS | Status: DC

## 2017-08-31 MED ORDER — ASPIRIN EC 81 MG PO TBEC
81.0000 mg | DELAYED_RELEASE_TABLET | Freq: Every day | ORAL | Status: DC
  Administered 2017-09-01 – 2017-09-02 (×2): 81 mg via ORAL
  Filled 2017-08-31 (×2): qty 1

## 2017-08-31 MED ORDER — ALPRAZOLAM 0.5 MG PO TABS
0.5000 mg | ORAL_TABLET | Freq: Three times a day (TID) | ORAL | Status: DC
  Administered 2017-08-31 – 2017-09-01 (×4): 0.5 mg via ORAL
  Filled 2017-08-31 (×4): qty 1

## 2017-08-31 MED ORDER — QUETIAPINE FUMARATE 25 MG PO TABS
12.5000 mg | ORAL_TABLET | Freq: Every day | ORAL | Status: DC
  Administered 2017-08-31 – 2017-09-01 (×2): 12.5 mg via ORAL
  Filled 2017-08-31 (×2): qty 1

## 2017-08-31 MED ORDER — SERTRALINE HCL 50 MG PO TABS
50.0000 mg | ORAL_TABLET | Freq: Every day | ORAL | Status: DC
  Administered 2017-08-31 – 2017-09-01 (×2): 50 mg via ORAL
  Filled 2017-08-31 (×2): qty 1

## 2017-08-31 MED ORDER — LORAZEPAM 2 MG/ML IJ SOLN
0.2500 mg | Freq: Four times a day (QID) | INTRAMUSCULAR | Status: DC | PRN
  Administered 2017-08-31: 0.25 mg via INTRAVENOUS
  Filled 2017-08-31: qty 1

## 2017-08-31 MED ORDER — MEMANTINE HCL 10 MG PO TABS
10.0000 mg | ORAL_TABLET | Freq: Two times a day (BID) | ORAL | Status: DC
  Administered 2017-08-31 – 2017-09-02 (×4): 10 mg via ORAL
  Filled 2017-08-31 (×4): qty 1

## 2017-08-31 MED ORDER — MOMETASONE FURO-FORMOTEROL FUM 100-5 MCG/ACT IN AERO
2.0000 | INHALATION_SPRAY | Freq: Two times a day (BID) | RESPIRATORY_TRACT | Status: DC
  Administered 2017-08-31 – 2017-09-01 (×2): 2 via RESPIRATORY_TRACT
  Filled 2017-08-31: qty 8.8

## 2017-08-31 MED ORDER — ALPRAZOLAM 0.5 MG PO TABS
0.5000 mg | ORAL_TABLET | ORAL | Status: DC | PRN
  Administered 2017-09-01 – 2017-09-02 (×2): 0.5 mg via ORAL
  Filled 2017-08-31 (×2): qty 1

## 2017-08-31 MED ORDER — LEVOTHYROXINE SODIUM 25 MCG PO TABS
125.0000 ug | ORAL_TABLET | Freq: Every day | ORAL | Status: DC
  Administered 2017-09-01 – 2017-09-02 (×2): 125 ug via ORAL
  Filled 2017-08-31 (×2): qty 1

## 2017-08-31 MED ORDER — FAMOTIDINE IN NACL 20-0.9 MG/50ML-% IV SOLN
20.0000 mg | INTRAVENOUS | Status: DC
Start: 2017-09-01 — End: 2017-09-01
  Administered 2017-09-01: 20 mg via INTRAVENOUS
  Filled 2017-08-31: qty 50

## 2017-08-31 MED ORDER — DONEPEZIL HCL 10 MG PO TABS
10.0000 mg | ORAL_TABLET | Freq: Every day | ORAL | Status: DC
  Administered 2017-08-31 – 2017-09-01 (×2): 10 mg via ORAL
  Filled 2017-08-31 (×2): qty 1

## 2017-08-31 MED ORDER — SERTRALINE HCL 50 MG PO TABS
75.0000 mg | ORAL_TABLET | Freq: Every day | ORAL | Status: DC
  Administered 2017-09-01 – 2017-09-02 (×2): 75 mg via ORAL
  Filled 2017-08-31 (×2): qty 1

## 2017-08-31 NOTE — Clinical Social Work Note (Signed)
Clinical Social Work Assessment  Patient Details  Name: Sarah Johnson MRN: [id number] Date of Birth: [birth year]  Date of referral:  08/31/17               Reason for consult:  Facility Placement, Discharge Planning                Permission sought to share information with:    Permission granted to share information::     Name::        Agency::     Relationship::     Contact Information:     Housing/Transportation Living arrangements for the past 2 months:  [place] of Information:  Adult Children(Daughter - Shavonne Ambroise [phone]) Patient Interpreter Needed:  None Criminal Activity/Legal Involvement Pertinent to Current Situation/Hospitalization:  No - Comment as needed Significant Relationships:  Adult Children Lives with:  Facility Resident Do you feel safe going back to the place where you live?  (Patient's daguhter wants patient to dc to a SNF) Need for family participation in patient care:  Yes (Comment)  Care giving concerns:  Patient from [place] (memory care unit). Per [employer] staff, patient requires assistance with dressing and bathing. Staff reported that patient is independent with feeding. Staff reported that patient uses a wheelchair. Patient's daughter Payden Bonus) reported that when patient admitted to [facility]F about 1 week ago patient was just starting to walk better with walker. Patient's daughter reported that ALF MD stopped patient's O2 and changed patient's medications and "it went downhill from there". Patient's daughter reported that she and her siblings want patient to discharge to [facility]F. PT consulted, evaluation pending.    Social Worker assessment / plan:  CSW spoke with patient's daughter regarding patient's discharge planning. Patient's daughter reported that patient has multiple transitions recently and that "the grass isn't always greener on the other side". Patient's daughter  reported that patient was at [facility] in Dec. 2018 after patient broke her hip. Patient's daughter reported that patient was at [facility] for a Katasha Riga time and that Medicare stopped paying. Patient's daughter reported that patient discharged from [facility] to [facility] and that the ALF reported that "we accepted your mother but we aren't able to care for her, we need you to come sit with her". Patient's daughter reported that patient discharged from [facility] and admitted to [facility] and that she has not been happy with patient's care at [place]. Patient's daughter reported that patient's son spoke with Director Danielle at [facility] SNF and that they reported that they can accept patient back. CSW informed patient's daughter that PT has been consulted and the evaluation is pending. CSW and patient's daughter discussed ST rehab vs [facility] term care. Patient's daughter inquired about Medicare covering patient's stay at [facility]. CSW explained that medicare covers ST rehab and inquired about if patient has had a 60 day break in [facility] care, patient reported no that patient discharged from SNF approx. 3 weeks ago. CSW explained Medicare only starts over when their is a 60 day break in care, patient's daughter verbalized understanding. CSW agreed to follow up with [facility] to see if patient used all 100 days at [facility] during last admission, patient's daughter agreeable. Patient's daughter reported that her other sister Berniece Pap) is patient's HCPOA. CSW agreed to reach out to patient's other daughter as well.   CSW contacted patient's daughter (  Berniece Pap [phone]) no answer, CSW left voicemail requesting return phone call.  CSW contacted [facility] and spoke with admissions staff Levada Dy, staff reported that they can accept patient for ST rehab if patient needs it and transition patient into Eswin Worrell term care at [facility]. Staff agreed to look up patient's Medicare  days if PT recommends ST rehab. CSW agreed to follow up with [facility] after PT evaluates patient.   CSW will complete FL2 after PT evaluates patient and makes recommendation.  CSW will continue to follow and assist with discharge planning.   Employment status:  Retired Forensic scientist:  Medicare PT Recommendations:  Not assessed at this time Information / Referral to community resources:  [place]  Patient/Family's Response to care:  Patient's daughter appreciative of CSW assistance with discharge planning.  Patient/Family's Understanding of and Emotional Response to Diagnosis, Current Treatment, and Prognosis:  Patient's daughter involved in patient's care and in agreement with current treatment plan. CSW and patient's daughter discussed patient's daughter's concerns about patient's care at ALF. CSW provided active listening and validated patient's daughter's concerns. Patient's daughter verbalized plan for patient to dc to [facility] for ST rehab and or [facility] term care.   Emotional Assessment Appearance:    Attitude/Demeanor/Rapport:  Unable to Assess Affect (typically observed):  Unable to Assess Orientation:  Oriented to Self Alcohol / Substance use:  Not Applicable Psych involvement (Current and /or in the community):  No (Comment)  Discharge Needs  Concerns to be addressed:  Care Coordination Readmission within the last 30 days:  No Current discharge risk:  Dependent with Mobility Barriers to Discharge:  Continued Medical Work up   [employer], LCSW 08/31/2017, 10:27 AM

## 2017-08-31 NOTE — Progress Notes (Signed)
  Speech Language Pathology Treatment: Dysphagia  Patient Details Name: Sarah Johnson MRN: [id number] DOB: [birth year] Today's Date: 08/31/2017 Time: 6962-9528 SLP Time Calculation (min) (ACUTE ONLY): 38 min  Assessment / Plan / Recommendation Clinical Impression  Pt today fully alert, she denies dysphagia symptoms but has cognitive deficits.   Provided pt with education re: findings of MBS 06/2017 and recommendation for esophagram at that time.  SLP observed pt with intake of ice chips, thin liquids, nectar, puree and solids  *soft.   Pt demonstrated immediate cough post-swallow of 1/4 boluses of thin - stating "I strangled".  Use of thickener did not prevent cough x1/5 boluses.    From review of OP MBS 06/2017, pt's daughter had not reported pt coughing with intake.     Continued concerns for aspiration = postprandial or even episodic aspiration from possible premature spillage of barium into pharynx/larynx.  Pt tolerated applesauce and graham cracker well.   Recommend proceed with esophagram given findings of prior MBS and current pulmonary issues. Will follow up next date or later today after esophagram.  Thanks.    HPI HPI: 84 yo female adm to [facility] respiratory deficits  - PMH + for CXR 08/29/2017 ? pna - left lung base ATX/scarring vs infiltrate - left pleural effusion, emphysema.  CT showed atrophy 08/29/2017.   MBS 06/29/2017 normal op swallow - flash penetration, concern for risk for post=prandial aspiration - pt could not swallow tablet.        SLP Plan  Continue with current plan of care(follow up after esophagram)       Recommendations  Diet recommendations: NPO;Other(comment)(x medicine and ice pending esophagram) Medication Administration: Whole meds with puree                Oral Care Recommendations: Oral care QID Follow up Recommendations: None SLP Visit Diagnosis: Dysphagia, pharyngoesophageal phase (R13.14);Dysphagia, unspecified (R13.10) Plan: Continue with current  plan of care(follow up after esophagram)       GO                Macario Golds 08/31/2017, 11:17 AM Luanna Salk, [facility] SLP [phone]

## 2017-08-31 NOTE — Progress Notes (Signed)
PROGRESS NOTE    Sarah Johnson  JQB:[id number] DOB: 04-21-1933 DOA: 08/29/2017 PCP: Jani Gravel, MD  Brief Narrative:  Sarah Johnson is a 84 y.o. female with medical history significant of splenectomy, hypertension, hyperlipidemia, COPD, GERD, hypothyroidism, depression with anxiety, right eye blindness, CKD-3, PE/DVT not on anticoagulants, s/p of aortic valve replacement with bioprosthetic valve, s/p of thoracic aortic aneurysm repair, dementia, who presented with altered mental status and cough. Admitted for Acute on Chronic Respiratory Failure and found to have a COPD Exacerbation and likely PNA. SLP evaluated and recommending Dysphagia 3 Diet.   Assessment & Plan:   Principal Problem:   Acute on chronic respiratory failure with hypoxia (HCC) Active Problems:   HLD (hyperlipidemia)   HTN (hypertension)   GERD (gastroesophageal reflux disease)   History of aortic valve replacement   HCAP (healthcare-associated pneumonia)   Acute renal failure superimposed on stage 3 chronic kidney disease (HCC)   COPD with acute exacerbation (HCC)   Hypothyroidism   Dementia   Depression   Chronic diastolic CHF (congestive heart failure) (HCC)   Sacral pressure ulcer  Acute on Chronic respiratory failure with hypoxia/hypercapnia/ COPD exacerbation with Concomitant PNA -patient's daughter states she was told her mother is a CO2 retainer by pulmonology and should be on 2L at all times -Upon admission, ABG showed pH 7.445, PCO2 47.9, PO2 71.4 -CXR reviewed, showing possible infiltrate left lung base -Influenza A/B PCR negative -Patient was started on broad-spectrum antibiotics antibiotics as below and will continue IV Meropenem given Splenectomy  -Continue supplemental oxygen to maintain saturations above 92%, Solu-Medrol IV 60 mg BID, DuoNeb Nebulizer treatments scheduled 3 mL's 3 times daily as well as 3 mL's every 2 as needed for wheezing or shortness of Breath -Continue antibiotics as  below -Blood Cx x2 Negative  -Continue Dulera 2 puffs elevations twice daily -Added Flutter Valve and Incentive Spirometery   Acute Metabolic Encephalopathy -Possibly secondary to hypoxia and pneumonia -Discussed with daughter, mildly improved -It seems that patient was lethargic upon arrival to the emergency department no longer appears as lethargic  Suspected Pneumonia -CXR as Above -Patient placed on broad-spectrum antibiotics with vancomycin and Merrem for possible aspiration pneumonia versus HCAP (patient with penicillin allergy); IV vancomycin is now discontinued and will continue IV meropenem and then de-escalate accordingly -Strep pneumonia urine antigen and legionella urine antigen -SLP commending Dysphagia 3 Diet  Hyperlipidemia -Statin held but MAR indicates the patient no longer takes this.  We will have to clarify  Hypertension -BP was mildly soft on admission, currently normotensive -Home medications held and will continue to hold at this time  GERD -Continue IV famotidine now and change to 20 mg daily  Acute kidney injury on chronic kidney disease, stage III -Creatinine on admission 2.83 (last creatinine in March 2019 was 2.31) -BUN/creatinine now improved to 36/1.62 -baseline creatine appears to be 1.3-1.5 -Will continue IVF at a lower rate of 50 mL's per hour -Repeat CMP in a.m. and may resume patient's home Lasix in the next couple days  Hypothyroidism -IV Levothyroxine Discontinued and resumed Home Levothyroxine dose of 125 mcg Daily  Dementia with Behavioral Disturbance -Home Medications were on hold but resumed now that she is on Dysphagia 3 Diet -IV Lorazepam discontinued  -Resume Donepezil 10 mg p.o. Nightly, Memantine 10 mg p.o. twice daily -Continue with Quetiapine 12.5 mg p.o. Nightly and Divalproex 250 mg qHS  Anxiety/Depression -Home medications initially held due to lethargy but patient is improved so will resume -Continue with  Alprazolam 0.5 mg p.o. 3 times daily daily, Alprazolam 0.5 mg p.o. every 4 as needed anxiety, Sertraline 75 mg in the a.m. and 50 mg nightly  Pressure Ulcer -WOC Nurse evaluated and wound is a DTI\ -[place] Nurse recommending continuing the sacral foam dressing.  Add to the treatment regimen Criticaid Clear (available in the stock room in a purple and white tube).  This should be applied twice daily.  Also, will add an order for an air mattress and turning right or left and limit the amount of time in the supine position.  DVT prophylaxis: Enoxaparin 30 mg sq Daily  Code Status: DO NOT RESUSCITATE Family Communication: No family present at bedside  Disposition Plan: Back to SNF when Medically stable   Consultants:   None  Procedures: MBS  Antimicrobials:  Anti-infectives (From admission, onward)   Start     Dose/Rate Route Frequency Ordered Stop   08/30/17 1000  meropenem (MERREM) 500 mg in sodium chloride 0.9 % 100 mL IVPB     500 mg 200 mL/hr over 30 Minutes Intravenous Every 12 hours 08/30/17 0722     08/30/17 0015  meropenem (MERREM) 1 g in sodium chloride 0.9 % 100 mL IVPB     1 g 200 mL/hr over 30 Minutes Intravenous  Once 08/30/17 0009     08/30/17 0015  vancomycin (VANCOCIN) 1,500 mg in sodium chloride 0.9 % 500 mL IVPB     1,500 mg 250 mL/hr over 120 Minutes Intravenous  Once 08/30/17 0009 08/30/17 0455   08/29/17 2300  cefTRIAXone (ROCEPHIN) 1 g in sodium chloride 0.9 % 100 mL IVPB  Status:  Discontinued     1 g 200 mL/hr over 30 Minutes Intravenous  Once 08/29/17 2246 08/29/17 2331   08/29/17 2300  azithromycin (ZITHROMAX) 500 mg in sodium chloride 0.9 % 250 mL IVPB  Status:  Discontinued     500 mg 250 mL/hr over 60 Minutes Intravenous  Once 08/29/17 2246 08/29/17 2331     Subjective: Patient was pleasantly demented and had no concerns or complaints at this time.  No chest pain, shortness breath, nausea, vomiting, lightheadedness or dizziness.  Was unable to tell me the  place she was in year she was oriented to herself.  Per nursing she became very agitated and combative later on in the afternoon but improved.  Objective: Vitals:   08/31/17 0016 08/31/17 0658 08/31/17 0822 08/31/17 1318  BP: 132/60 107/68  (!) 113/59  Pulse: 63 (!) 55  68  Resp: 18 20  16   Temp: 98.1 F (36.7 C) (!) 97.5 F (36.4 C)  98 F (36.7 C)  TempSrc: Oral Oral  Oral  SpO2: (!) 88% 96% 93% 95%  Weight:      Height:        Intake/Output Summary (Last 24 hours) at 08/31/2017 1829 Last data filed at 08/31/2017 1400 Gross per 24 hour  Intake 7485.83 ml  Output 700 ml  Net 6785.83 ml   Filed Weights   08/30/17 0510  Weight: 56.9 kg (125 lb 7.1 oz)   Examination: Physical Exam:  Constitutional: Thin Caucasian Female in NAD and appears calm and comfortable Eyes: Lids and conjunctivae normal, sclerae anicteric  ENMT: External Ears, Nose appear normal. Grossly normal hearing. Mucous membranes are moist.  Neck: Appears normal, supple, no cervical masses, normal ROM, no appreciable thyromegaly, no JVD Respiratory: Diminished to auscultation bilaterally with coarse breath sounds. Normal respiratory effort and patient is not tachypenic. No accessory muscle use.  Cardiovascular: RRR, no murmurs / rubs / gallops. S1 and S2 auscultated. Trace extremity edema.  Abdomen: Soft, non-tender, non-distended. No masses palpated. No appreciable hepatosplenomegaly. Bowel sounds positive x4.  GU: Deferred. Musculoskeletal: No clubbing / cyanosis of digits/nails. No joint deformity upper and lower extremities. Skin: Has a Pressure ulcer on the back. No induration; Warm and dry.  Neurologic: Moves all extremities independently.  Psychiatric: Impaired judgment and insight. Alert and oriented x 1. Normal mood and appropriate affect.   Data Reviewed: I have personally reviewed following labs and imaging studies  CBC: Recent Labs  Lab 08/29/17 2004 08/31/17 0657  WBC 12.7* 5.5  HGB 14.3 10.6*   HCT 42.9 33.9*  MCV 93.3 95.5  PLT 133* 130   Basic Metabolic Panel: Recent Labs  Lab 08/29/17 2004 08/31/17 0515 08/31/17 0657  NA 141 QUESTIONABLE RESULTS, RECOMMEND RECOLLECT TO VERIFY 143  K 3.9 QUESTIONABLE RESULTS, RECOMMEND RECOLLECT TO VERIFY 4.1  CL 93* QUESTIONABLE RESULTS, RECOMMEND RECOLLECT TO VERIFY 109  CO2 31 QUESTIONABLE RESULTS, RECOMMEND RECOLLECT TO VERIFY 26  GLUCOSE 103* QUESTIONABLE RESULTS, RECOMMEND RECOLLECT TO VERIFY 100*  BUN 53* QUESTIONABLE RESULTS, RECOMMEND RECOLLECT TO VERIFY 36*  CREATININE 2.83* QUESTIONABLE RESULTS, RECOMMEND RECOLLECT TO VERIFY 1.62*  CALCIUM 10.1 QUESTIONABLE RESULTS, RECOMMEND RECOLLECT TO VERIFY 8.8*  MG  --   --  2.0  PHOS  --   --  4.2   GFR: Estimated Creatinine Clearance: 21.2 mL/min (A) (by C-G formula based on SCr of 1.62 mg/dL (H)). Liver Function Tests: Recent Labs  Lab 08/29/17 2004  AST 44*  ALT 19  ALKPHOS 61  BILITOT 0.4  PROT 7.5  ALBUMIN 3.6   No results for input(s): LIPASE, AMYLASE in the last 168 hours. No results for input(s): AMMONIA in the last 168 hours. Coagulation Profile: No results for input(s): INR, PROTIME in the last 168 hours. Cardiac Enzymes: No results for input(s): CKTOTAL, CKMB, CKMBINDEX, TROPONINI in the last 168 hours. BNP (last 3 results) No results for input(s): PROBNP in the last 8760 hours. HbA1C: No results for input(s): HGBA1C in the last 72 hours. CBG: Recent Labs  Lab 08/29/17 2017  GLUCAP 97   Lipid Profile: No results for input(s): CHOL, HDL, LDLCALC, TRIG, CHOLHDL, LDLDIRECT in the last 72 hours. Thyroid Function Tests: No results for input(s): TSH, T4TOTAL, FREET4, T3FREE, THYROIDAB in the last 72 hours. Anemia Panel: No results for input(s): VITAMINB12, FOLATE, FERRITIN, TIBC, IRON, RETICCTPCT in the last 72 hours. Sepsis Labs: Recent Labs  Lab 08/30/17 0541 08/30/17 0830  LATICACIDVEN 0.9 0.9    Recent Results (from the past 240 hour(s))   Culture, blood (Routine X 2) w Reflex to ID Panel     Status: None (Preliminary result)   Collection Time: 08/29/17 11:34 PM  Result Value Ref Range Status   Specimen Description   Final    BLOOD LEFT ANTECUBITAL Performed at [facility] [address], [place], [place] [address]    Special Requests   Final    BOTTLES DRAWN AEROBIC AND ANAEROBIC Blood Culture adequate volume Performed at [facility] [address] [place], [place] [address]    Culture   Final    NO GROWTH 1 DAY Performed at [facility], [place] [address] [place], [place] [address]    Report Status PENDING  Incomplete  Culture, blood (Routine X 2) w Reflex to ID Panel     Status: None (Preliminary result)   Collection Time: 08/30/17  5:41 AM  Result Value Ref Range Status   Specimen Description   Final    BLOOD LEFT ANTECUBITAL Performed at [facility] [address] [place], [place] [address]    Special Requests   Final    BOTTLES DRAWN AEROBIC ONLY Blood Culture adequate volume Performed at [facility] [address] [place], [place] [address]    Culture   Final    NO GROWTH 1 DAY Performed at [facility], [place] [address] [place] [address], [place], [place] [address]    Report Status PENDING  Incomplete  MRSA PCR Screening     Status: None   Collection Time: 08/30/17  7:01 AM  Result Value Ref Range Status   MRSA by PCR NEGATIVE NEGATIVE Final    Comment:        The GeneXpert MRSA Assay (FDA approved for NASAL specimens only), is one component of a comprehensive MRSA colonization surveillance program. It is not intended to diagnose MRSA infection nor to guide or monitor treatment for MRSA infections. Performed at [facility], [place] [address], [place], [place] [address]     Radiology Studies: Dg Chest 2 View  Result Date: 08/29/2017 CLINICAL DATA:  84 year old female with COPD and altered mental  status. EXAM: CHEST - 2 VIEW COMPARISON:  Chest radiograph dated 06/21/2017 FINDINGS: There is emphysematous changes of the lungs. Left lung base/retrocardiac densities may represent atelectatic changes/scarring. Infiltrate is not excluded. Clinical correlation is recommended. There is blunting of the left costophrenic angle which may represent scarring or small effusion. There is no pneumothorax. Stable top-normal cardiac size. There is prominence of the hila suggestive of pulmonary hypertension. There is osteopenia with degenerative changes of the spine. Midthoracic vertebroplasty changes noted. No acute osseous pathology. Median sternotomy wires. IMPRESSION: 1. Left lung base atelectasis/scarring versus infiltrate. Probable trace left pleural effusion. 2. Emphysema. Electronically Signed   By: Anner Crete M.D.   On: 08/29/2017 22:01   Ct Head Wo Contrast  Result Date: 08/29/2017 CLINICAL DATA:  Altered mental status.  Lethargy. EXAM: CT HEAD WITHOUT CONTRAST TECHNIQUE: Contiguous axial images were obtained from the base of the skull through the vertex without intravenous contrast. COMPARISON:  Head CT, 04/17/2017 FINDINGS: Brain: No evidence of acute infarction, hemorrhage, hydrocephalus, extra-axial collection or mass lesion/mass effect. There is ventricular sulcal enlargement reflecting moderate generalized atrophy. Mild periventricular white matter hypoattenuation is noted consistent with chronic microvascular ischemic change. Vascular: No hyperdense vessel or unexpected calcification. Skull: Normal. Negative for fracture or focal lesion. Sinuses/Orbits: Visualized globes and orbits are unremarkable. Visualized sinuses and mastoid air cells are clear. Other: None. IMPRESSION: 1. No acute intracranial abnormalities. 2. Atrophy and mild chronic microvascular ischemic change. Findings similar to the prior CT. Electronically Signed   By: Lajean Manes M.D.   On: 08/29/2017 22:03   Dg Swallowing  Func-speech Pathology  Result Date: 08/31/2017 Objective Swallowing Evaluation: Type of Study: MBS-Modified Barium Swallow Study  Patient Details Name: KAYANN MAJ MRN: [id number] Date of Birth: December 04, 1932 Today's Date: 08/31/2017 Time: SLP Start Time (ACUTE ONLY): 1430 -SLP Stop Time (ACUTE ONLY): 1510 SLP Time Calculation (min) (ACUTE ONLY): 40 min Past Medical History: Past Medical History: Diagnosis Date . Arthritis  . Blind right eye  . Cataract   RT . CKD (chronic kidney disease)  . COPD (chronic obstructive pulmonary disease) ([facility])  . Emphysema   followed by Dr. Joya Gaskins . GERD (gastroesophageal reflux disease)  . History of Doppler echocardiogram  carotid dopplers. (8/11) no significant stenosis . HTN (hypertension)  . Hx of echocardiogram 2015  Echo (08/2013):  EF 60-65%, no RWMA, Gr 1 DD, AVR ok (mean 5 mmHg), mild MR, PASP 22 mmHg . Hyperlipidemia  . Hypothyroidism  . Pulmonary embolism ([facility]) 2010  She is no onger taking coumadin . S/P aortic valve replacement with bioprosthetic valve   ehco (8/11) with EF 55-60%, mild-mod MR, mild-mod TR, poorly visualized bioprosthetic aortic valve but no significant regurgitation and gradient not significanly elevated.   . S/P thoracic aortic aneurysm repair 1/10  also with bioprosthetic aortic valve prelacement [facility]). in 10/10 she has a descending thoraic aorta anuerysm and abdominal reapir [facility]). Vascular urgeon is Dr. Lunette Stands.  Marland Kitchen SVT (supraventricular tachycardia) (HCC)   HOLTER MONITOR AFTER SURGERY  Past Surgical History: Past Surgical History: Procedure Laterality Date . ANOMALOUS PULMONARY VENOUS RETURN REPAIR   . BACK SURGERY  1970  . EYE SURGERY    AS BABY- RT EYE  POOR SIGHT . INTRAMEDULLARY (IM) NAIL INTERTROCHANTERIC Right 04/20/2017  Procedure: INTRAMEDULLARY (IM) NAIL INTERTROCHANTRIC;  Surgeon: Leandrew Koyanagi, MD;  Location: [facility];  Service: Orthopedics;  Laterality: Right; . LUMBAR LAMINECTOMY/DECOMPRESSION MICRODISCECTOMY  05/31/2011   Procedure: LUMBAR LAMINECTOMY/DECOMPRESSION MICRODISCECTOMY;  Surgeon: Elaina Hoops, MD;  Location: [facility];  Service: Neurosurgery;  Laterality: Bilateral;  Bilateral Lumbar three-four decompressionj lumbar laminectomy  . THORACIC AORTIC ANEURYSM REPAIR  1/10  VALVE ALSO DONE . THORACOABDOMINAL AORTIC ANEURYSM REPAIR  01/28/09  repair . TONSILLECTOMY   HPI: 84 yo female adm to [facility] respiratory deficits  - PMH + for CXR 08/29/2017 ? pna - left lung base ATX/scarring vs infiltrate - left pleural effusion, emphysema.  CT showed atrophy 08/29/2017.   MBS 06/29/2017 normal op swallow - flash penetration, concern for risk for post=prandial aspiration - pt could not swallow tablet.   Subjective: pt awake in chair Assessment / Plan / Recommendation CHL IP CLINICAL IMPRESSIONS 08/31/2017 Clinical Impression Mild oropharyngeal dysphagia without aspiration of any consistency tested.  Pt with oral coordination deficits resulting in premature spillage of boluses into pharynx = hitting pyriform sinus prior to swallow trigger.  Trace laryngeal penetration of thin= observed that cleared with same swallow.   Pt with delayed oral with premature spillage of pudding, cracker to vallecular space - which may increase aspiration risk.  Prominent CP noted -barium tablet appeared to lodge at distal esophagus - without awareness.  Further liquids aided clearance.  Per MD note from OP MBS 06/2017 - pt had mild esophageal dysmotlity.  Of note, pt coughed twice during MBS without bairum visualized in larynx.   Recommend continue dys3/thin diet with strict precautions.  SLP Visit Diagnosis Dysphagia, oropharyngeal phase (R13.12) Attention and concentration deficit following -- Frontal lobe and executive function deficit following -- Impact on safety and function Mild aspiration risk   CHL IP TREATMENT RECOMMENDATION 08/31/2017 Treatment Recommendations Therapy as outlined in treatment plan below   Prognosis 08/31/2017 Prognosis for Safe Diet Advancement  Fair Barriers to Reach Goals -- Barriers/Prognosis Comment -- CHL IP DIET RECOMMENDATION 08/31/2017 SLP Diet Recommendations Dysphagia 3 (Mech soft) solids;Thin liquid Liquid Administration via Straw Medication Administration Whole meds with puree Compensations Slow rate;Small sips/bites Postural Changes Remain semi-upright after after feeds/meals (Comment);Seated upright at 90 degrees   CHL IP OTHER RECOMMENDATIONS 08/31/2017 Recommended Consults -- Oral Care Recommendations Oral care BID Other Recommendations --   CHL IP FOLLOW UP RECOMMENDATIONS 08/31/2017 Follow up Recommendations None   CHL IP FREQUENCY  AND DURATION 08/31/2017 Speech Therapy Frequency (ACUTE ONLY) min 1 x/week Treatment Duration 1 week      CHL IP ORAL PHASE 08/31/2017 Oral Phase Impaired Oral - Pudding Teaspoon -- Oral - Pudding Cup -- Oral - Honey Teaspoon -- Oral - Honey Cup -- Oral - Nectar Teaspoon Premature spillage Oral - Nectar Cup -- Oral - Nectar Straw Premature spillage Oral - Thin Teaspoon Premature spillage Oral - Thin Cup -- Oral - Thin Straw Premature spillage Oral - Puree Premature spillage;Delayed oral transit Oral - Mech Soft -- Oral - Regular Delayed oral transit;Decreased bolus cohesion;Premature spillage Oral - Multi-Consistency -- Oral - Pill Premature spillage Oral Phase - Comment --  CHL IP PHARYNGEAL PHASE 08/31/2017 Pharyngeal Phase Impaired Pharyngeal- Pudding Teaspoon -- Pharyngeal -- Pharyngeal- Pudding Cup -- Pharyngeal -- Pharyngeal- Honey Teaspoon -- Pharyngeal -- Pharyngeal- Honey Cup -- Pharyngeal -- Pharyngeal- Nectar Teaspoon Delayed swallow initiation-pyriform sinuses Pharyngeal -- Pharyngeal- Nectar Cup -- Pharyngeal -- Pharyngeal- Nectar Straw Delayed swallow initiation-pyriform sinuses Pharyngeal -- Pharyngeal- Thin Teaspoon Delayed swallow initiation-pyriform sinuses;Penetration/Aspiration before swallow Pharyngeal Material enters airway, remains ABOVE vocal cords then ejected out Pharyngeal- Thin Cup -- Pharyngeal  -- Pharyngeal- Thin Straw Delayed swallow initiation-pyriform sinuses;Penetration/Aspiration during swallow Pharyngeal Material enters airway, remains ABOVE vocal cords then ejected out Pharyngeal- Puree Delayed swallow initiation-vallecula Pharyngeal -- Pharyngeal- Mechanical Soft -- Pharyngeal -- Pharyngeal- Regular Delayed swallow initiation-vallecula Pharyngeal -- Pharyngeal- Multi-consistency -- Pharyngeal -- Pharyngeal- Pill WFL Pharyngeal -- Pharyngeal Comment --  CHL IP CERVICAL ESOPHAGEAL PHASE 08/31/2017 Cervical Esophageal Phase Impaired Pudding Teaspoon -- Pudding Cup -- Honey Teaspoon -- Honey Cup -- Nectar Teaspoon -- Nectar Cup -- Nectar Straw -- Thin Teaspoon -- Thin Cup -- Thin Straw -- Puree -- Mechanical Soft -- Regular -- Multi-consistency -- Pill -- Cervical Esophageal Comment prominent cricopharyngeus, barium taken with thin appeared to lodge at distal esophagus without pt sensation, further boluses of thin helpful to clear,  per prior MBS - MD documented "mild esophageal dysmotility" No flowsheet data found. Macario Golds 08/31/2017, 4:12 PM  Luanna Salk, [facility] SLP [phone]             Scheduled Meds: . ALPRAZolam  0.5 mg Oral TID  . aspirin EC  81 mg Oral Daily  . divalproex  250 mg Oral QHS  . donepezil  10 mg Oral QHS  . enoxaparin (LOVENOX) injection  30 mg Subcutaneous Daily  . ipratropium-albuterol  3 mL Nebulization TID  . [START ON 09/01/2017] levothyroxine  125 mcg Oral QAC breakfast  . memantine  10 mg Oral BID  . methylPREDNISolone (SOLU-MEDROL) injection  60 mg Intravenous BID  . mometasone-formoterol  2 puff Inhalation BID  . QUEtiapine  12.5 mg Oral QHS  . sertraline  50-75 mg Oral BID   Continuous Infusions: . sodium chloride 125 mL/hr at 08/31/17 0320  . sodium chloride     Followed by  . sodium chloride 1,000 mL (08/30/17 0126)  . [START ON 09/01/2017] famotidine (PEPCID) IV    . meropenem (MERREM) IV    . meropenem (MERREM) IV Stopped (08/31/17 1433)     LOS: 2 days   Kerney Elbe, DO [facility] Pager [phone]  If 7PM-7AM, please contact night-coverage [email or web address] Password [facility] 08/31/2017, 6:29 PM

## 2017-08-31 NOTE — Progress Notes (Signed)
PT Cancellation Note  Patient Details Name: Sarah Johnson MRN: [id number] DOB: [birth year]   Cancelled Treatment:    Reason Eval/Treat Not Completed: Patient at procedure or test/unavailable   Claretha Cooper 08/31/2017, 5:29 PM Tresa Endo PT [phone]

## 2017-08-31 NOTE — Progress Notes (Signed)
Modified Barium Swallow Progress Note  Patient Details  Name: Sarah Johnson MRN: [id number] Date of Birth: February 21, 1933  Today's Date: 08/31/2017   MBS completed by SLP as Odette Horns reported their conversation with MD resulted in change of order to MBS.  Apologize if not accurate.   Modified Barium Swallow completed.  Full report located under Chart Review in the Imaging Section.  Brief recommendations include the following:  Clinical Impression  Mild oropharyngeal dysphagia without aspiration of any consistency tested.  Pt with oral coordination deficits resulting in premature spillage of boluses into pharynx = hitting pyriform sinus prior to swallow trigger.  Trace laryngeal penetration of thin= observed that cleared with same swallow.   Pt with delayed oral with premature spillage of pudding, cracker to vallecular space - which may increase aspiration risk.  Prominent CP noted -barium tablet appeared to lodge at distal esophagus - without awareness.  Further liquids aided clearance.  Per MD note from OP MBS 06/2017 - pt had mild esophageal dysmotlity.  Of note, pt coughed twice during MBS without bairum visualized in larynx.   Recommend continue dys3/thin diet with strict precautions.    Swallow Evaluation Recommendations       SLP Diet Recommendations: Dysphagia 3 (Mech soft) solids;Thin liquid   Liquid Administration via: Straw   Medication Administration: Whole meds with puree       Compensations: Slow rate;Small sips/bites(drink  liquids t/o meal)   Postural Changes: Remain semi-upright after after feeds/meals (Comment);Seated upright at 90 degrees   Oral Care Recommendations: Oral care BID       Luanna Salk, [facility] SLP [phone]  Sarah Johnson 08/31/2017,4:10 PM

## 2017-09-01 LAB — COMPREHENSIVE METABOLIC PANEL
ALBUMIN: 2.8 g/dL — AB (ref 3.5–5.0)
ALK PHOS: 41 U/L (ref 38–126)
ALT: 15 U/L (ref 14–54)
AST: 25 U/L (ref 15–41)
Anion gap: 13 (ref 5–15)
BILIRUBIN TOTAL: 0.7 mg/dL (ref 0.3–1.2)
BUN: 28 mg/dL — AB (ref 6–20)
CALCIUM: 9.5 mg/dL (ref 8.9–10.3)
CO2: 24 mmol/L (ref 22–32)
CREATININE: 1.26 mg/dL — AB (ref 0.44–1.00)
Chloride: 110 mmol/L (ref 101–111)
GFR calc Af Amer: 45 mL/min — ABNORMAL LOW (ref >60)
GFR calc non Af Amer: 39 mL/min — ABNORMAL LOW (ref >60)
GLUCOSE: 122 mg/dL — AB (ref 65–99)
POTASSIUM: 3.6 mmol/L (ref 3.5–5.1)
Sodium: 147 mmol/L — ABNORMAL HIGH (ref 135–145)
TOTAL PROTEIN: 5.8 g/dL — AB (ref 6.5–8.1)

## 2017-09-01 LAB — CBC WITH DIFFERENTIAL/PLATELET
BASOS ABS: 0 10*3/uL (ref 0.0–0.1)
Basophils Relative: 0 %
EOS PCT: 0 %
Eosinophils Absolute: 0 10*3/uL (ref 0.0–0.7)
HEMATOCRIT: 34.4 % — AB (ref 36.0–46.0)
HEMOGLOBIN: 10.9 g/dL — AB (ref 12.0–15.0)
LYMPHS PCT: 15 %
Lymphs Abs: 1.3 10*3/uL (ref 0.7–4.0)
MCH: 30.4 pg (ref 26.0–34.0)
MCHC: 31.7 g/dL (ref 30.0–36.0)
MCV: 96.1 fL (ref 78.0–100.0)
MONOS PCT: 5 %
Monocytes Absolute: 0.4 10*3/uL (ref 0.1–1.0)
Neutro Abs: 7 10*3/uL (ref 1.7–7.7)
Neutrophils Relative %: 80 %
Platelets: 190 10*3/uL (ref 150–400)
RBC: 3.58 MIL/uL — AB (ref 3.87–5.11)
RDW: 20.4 % — ABNORMAL HIGH (ref 11.5–15.5)
WBC: 8.7 10*3/uL (ref 4.0–10.5)

## 2017-09-01 LAB — MAGNESIUM: Magnesium: 1.9 mg/dL (ref 1.7–2.4)

## 2017-09-01 LAB — PHOSPHORUS: PHOSPHORUS: 2.9 mg/dL (ref 2.5–4.6)

## 2017-09-01 MED ORDER — LORAZEPAM 2 MG/ML IJ SOLN
1.0000 mg | Freq: Once | INTRAMUSCULAR | Status: AC
  Administered 2017-09-01: 1 mg via INTRAVENOUS
  Filled 2017-09-01: qty 1

## 2017-09-01 MED ORDER — IPRATROPIUM-ALBUTEROL 0.5-2.5 (3) MG/3ML IN SOLN
3.0000 mL | Freq: Two times a day (BID) | RESPIRATORY_TRACT | Status: DC
  Administered 2017-09-01 – 2017-09-02 (×2): 3 mL via RESPIRATORY_TRACT
  Filled 2017-09-01 (×2): qty 3

## 2017-09-01 MED ORDER — SODIUM CHLORIDE 0.9 % IV SOLN
1.0000 g | INTRAVENOUS | Status: DC
  Administered 2017-09-01 – 2017-09-02 (×2): 1 g via INTRAVENOUS
  Filled 2017-09-01 (×2): qty 1

## 2017-09-01 MED ORDER — FAMOTIDINE 20 MG PO TABS
20.0000 mg | ORAL_TABLET | Freq: Every day | ORAL | Status: DC
  Administered 2017-09-02: 20 mg via ORAL
  Filled 2017-09-01: qty 1

## 2017-09-01 MED ORDER — METHYLPREDNISOLONE SODIUM SUCC 40 MG IJ SOLR
40.0000 mg | Freq: Two times a day (BID) | INTRAMUSCULAR | Status: DC
  Administered 2017-09-01 – 2017-09-02 (×2): 40 mg via INTRAVENOUS
  Filled 2017-09-01 (×2): qty 1

## 2017-09-01 NOTE — Progress Notes (Signed)
PROGRESS NOTE    Sarah Johnson  ZOX:[id number] DOB: [birth year] DOA: 08/29/2017 PCP: Jani Gravel, MD  Brief Narrative:  Sarah Johnson is a 84 y.o. female with medical history significant of splenectomy, hypertension, hyperlipidemia, COPD, GERD, hypothyroidism, depression with anxiety, right eye blindness, CKD-3, PE/DVT not on anticoagulants, s/p of aortic valve replacement with bioprosthetic valve, s/p of thoracic aortic aneurysm repair, dementia, who presented with altered mental status and cough. Admitted for Acute on Chronic Respiratory Failure and found to have a COPD Exacerbation and likely PNA. SLP evaluated and recommending Dysphagia 3 Diet.  Antibiotics are currently being de-escalated.   Assessment & Plan:   Principal Problem:   Acute on chronic respiratory failure with hypoxia (HCC) Active Problems:   HLD (hyperlipidemia)   HTN (hypertension)   GERD (gastroesophageal reflux disease)   History of aortic valve replacement   HCAP (healthcare-associated pneumonia)   Acute renal failure superimposed on stage 3 chronic kidney disease (HCC)   COPD with acute exacerbation (HCC)   Hypothyroidism   Dementia   Depression   Chronic diastolic CHF (congestive heart failure) (HCC)   Sacral pressure ulcer  Acute on Chronic respiratory failure with hypoxia/hypercapnia/ COPD exacerbation with Concomitant PNA -Patient's daughter stated to the Admitting Physician that she was told her mother is a CO2 retainer by pulmonology and should be on 2L at all times -Upon admission, ABG showed pH 7.445, PCO2 47.9, PO2 71.4 -CXR reviewed, showing possible infiltrate left lung base -Influenza A/B PCR negative -Patient was started on broad-spectrum antibiotics antibiotics as below and will continue IV Meropenem given Splenectomy  -Continue supplemental oxygen to maintain saturations above 92%, Solu-Medrol IV 60 mg BID to 40 mg IV twice daily, DuoNeb Nebulizer treatments scheduled 3 mL's 3 times daily as  well as 3 mL's every 2 as needed for wheezing or shortness of Breath -Continue antibiotics as below -Blood Cx x2 Negative at 2 days -Continue Dulera 2 puffs elevations twice daily -Added Flutter Valve and Incentive Spirometery  -Repeat CXR in the morning  Acute Metabolic Encephalopathy -Possibly secondary to hypoxia and pneumonia -Was sleepy today but awoken and alert.  -Continue with Delirium Precautions  Suspected Pneumonia -CXR as Above -Patient placed on broad-spectrum antibiotics with Vancomycin and Merrem for possible aspiration pneumonia versus HCAP (patient with penicillin allergy); IV vancomycin  now discontinued and will change IV Meropenem to IV Ceftriaxone -Strep pneumonia urine antigen and legionella urine antigen -SLP commending Dysphagia 3 Diet -Repeat chest x-ray in a.m.  Hyperlipidemia -Statin held but MAR indicates the patient no longer takes this.  We will have to clarify  Hypertension -BP was mildly soft on admission, currently normotensive and is 138/62 -Home medications held and will continue to hold at this time  GERD -Continue IV famotidine now and change to 20 mg daily po   Acute kidney injury on chronic kidney disease, stage III, improved -Creatinine on admission 2.83 (last creatinine in March 2019 was 2.31) -BUN/creatinine now improved to 28/1.26 -baseline creatine appears to be 1.3-1.5 -Discontinue IVF at this time  -Repeat CMP in a.m. and may resume patient's home Lasix in the next couple days if creatinine is stable  Hypothyroidism -IV Levothyroxine Discontinued and resumed Home Levothyroxine dose of 125 mcg Daily  Dementia with Behavioral Disturbance -Home Medications were on hold but resumed now that she is on Dysphagia 3 Diet -IV Lorazepam discontinued  -Resume Donepezil 10 mg p.o. Nightly, Memantine 10 mg p.o. twice daily -Continue with Quetiapine 12.5 mg p.o. Nightly and Divalproex  250 mg qHS -Improving    Anxiety/Depression -Home medications initially held due to lethargy but patient is improved so will resume -Continue with Alprazolam 0.5 mg p.o. 3 times daily daily, Alprazolam 0.5 mg p.o. every 4 as needed anxiety, Sertraline 75 mg in the a.m. and 50 mg nightly  Pressure Ulcer -WOC Nurse evaluated and wound is a DTI -WOC Nurse recommending continuing the sacral foam dressing.  Add to the treatment regimen Criticaid Clear (available in the stock room in a purple and white tube).  This should be applied twice daily.  Also, will add an order for an air mattress and turning right or left and limit the amount of time in the supine position.  Hyponatremia -Mild at 147 -Likely from IV fluid hydration -Continue to monitor and repeat CMP in a.m.  DVT prophylaxis: Enoxaparin 30 mg sq Daily  Code Status: DO NOT RESUSCITATE Family Communication: No family present at bedside  Disposition Plan: Back to SNF when Medically stable   Consultants:   None  Procedures: MBS  Antimicrobials:  Anti-infectives (From admission, onward)   Start     Dose/Rate Route Frequency Ordered Stop   08/30/17 1000  meropenem (MERREM) 500 mg in sodium chloride 0.9 % 100 mL IVPB     500 mg 200 mL/hr over 30 Minutes Intravenous Every 12 hours 08/30/17 0722     08/30/17 0015  meropenem (MERREM) 1 g in sodium chloride 0.9 % 100 mL IVPB     1 g 200 mL/hr over 30 Minutes Intravenous  Once 08/30/17 0009     08/30/17 0015  vancomycin (VANCOCIN) 1,500 mg in sodium chloride 0.9 % 500 mL IVPB     1,500 mg 250 mL/hr over 120 Minutes Intravenous  Once 08/30/17 0009 08/30/17 0455   08/29/17 2300  cefTRIAXone (ROCEPHIN) 1 g in sodium chloride 0.9 % 100 mL IVPB  Status:  Discontinued     1 g 200 mL/hr over 30 Minutes Intravenous  Once 08/29/17 2246 08/29/17 2331   08/29/17 2300  azithromycin (ZITHROMAX) 500 mg in sodium chloride 0.9 % 250 mL IVPB  Status:  Discontinued     500 mg 250 mL/hr over 60 Minutes Intravenous  Once  08/29/17 2246 08/29/17 2331     Subjective: Seen and examined at bedside and was awoken from sleep.  States she felt alright slightly confused still.  No chest pain, shortness breath, nausea, vomiting lightheadedness or dizziness.  Objective: Vitals:   08/31/17 1945 08/31/17 2220 09/01/17 0646 09/01/17 0812  BP: 128/61  (!) 141/62   Pulse: 62  (!) 49   Resp: 18  16   Temp: 98.1 F (36.7 C)  97.9 F (36.6 C)   TempSrc: Oral  Oral   SpO2: 90% 96% 98% 97%  Weight:      Height:        Intake/Output Summary (Last 24 hours) at 09/01/2017 2536 Last data filed at 09/01/2017 6440 Gross per 24 hour  Intake 7191.24 ml  Output 400 ml  Net 6791.24 ml   Filed Weights   08/30/17 0510  Weight: 56.9 kg (125 lb 7.1 oz)   Examination: Physical Exam:  Constitutional: Thin Caucasian female currently no acute distress appears calm and comfortable Eyes: Lids and are normal.  Has blindness in the right eye sclera is anicteric and left ENMT: External ears and nose appear normal.  Grossly normal hearing Neck: Appears supple with no JVD Respiratory: Diminished to auscultation bilaterally with coarse breath sounds and minimal wheezing. Patient has  normal respiratory effort and is not tachypneic when he supplemental oxygen via nasal cannula Cardiovascular: Regular rate rhythm.  Mild lower extremity edema Abdomen: Soft, nontender, nondistended.  Bowel sounds present all 4 quadrants GU: Deferred Musculoskeletal: No contractures or cyanosis.  No joint deformities noted Skin: Warm and dry.  Has a sacral lesion/short ulcer on her back. Neurologic: Cranial nerves II through XII grossly intact she has had blindness and cataract in the right eye.  Was all extremities independently Psychiatric: Continues to have slightly impaired judgment insight.  Was sleepy but awoken from sleep.  Normal mood and affect  Data Reviewed: I have personally reviewed following labs and imaging studies  CBC: Recent Labs  Lab  08/29/17 2004 08/31/17 0657 09/01/17 0511  WBC 12.7* 5.5 8.7  NEUTROABS  --   --  7.0  HGB 14.3 10.6* 10.9*  HCT 42.9 33.9* 34.4*  MCV 93.3 95.5 96.1  PLT 133* 156 833   Basic Metabolic Panel: Recent Labs  Lab 08/29/17 2004 08/31/17 0515 08/31/17 0657 09/01/17 0511  NA 141 QUESTIONABLE RESULTS, RECOMMEND RECOLLECT TO VERIFY 143 147*  K 3.9 QUESTIONABLE RESULTS, RECOMMEND RECOLLECT TO VERIFY 4.1 3.6  CL 93* QUESTIONABLE RESULTS, RECOMMEND RECOLLECT TO VERIFY 109 110  CO2 31 QUESTIONABLE RESULTS, RECOMMEND RECOLLECT TO VERIFY 26 24  GLUCOSE 103* QUESTIONABLE RESULTS, RECOMMEND RECOLLECT TO VERIFY 100* 122*  BUN 53* QUESTIONABLE RESULTS, RECOMMEND RECOLLECT TO VERIFY 36* 28*  CREATININE 2.83* QUESTIONABLE RESULTS, RECOMMEND RECOLLECT TO VERIFY 1.62* 1.26*  CALCIUM 10.1 QUESTIONABLE RESULTS, RECOMMEND RECOLLECT TO VERIFY 8.8* 9.5  MG  --   --  2.0 1.9  PHOS  --   --  4.2 2.9   GFR: Estimated Creatinine Clearance: 27.2 mL/min (A) (by C-G formula based on SCr of 1.26 mg/dL (H)). Liver Function Tests: Recent Labs  Lab 08/29/17 2004 09/01/17 0511  AST 44* 25  ALT 19 15  ALKPHOS 61 41  BILITOT 0.4 0.7  PROT 7.5 5.8*  ALBUMIN 3.6 2.8*   No results for input(s): LIPASE, AMYLASE in the last 168 hours. No results for input(s): AMMONIA in the last 168 hours. Coagulation Profile: No results for input(s): INR, PROTIME in the last 168 hours. Cardiac Enzymes: No results for input(s): CKTOTAL, CKMB, CKMBINDEX, TROPONINI in the last 168 hours. BNP (last 3 results) No results for input(s): PROBNP in the last 8760 hours. HbA1C: No results for input(s): HGBA1C in the last 72 hours. CBG: Recent Labs  Lab 08/29/17 2017  GLUCAP 97   Lipid Profile: No results for input(s): CHOL, HDL, LDLCALC, TRIG, CHOLHDL, LDLDIRECT in the last 72 hours. Thyroid Function Tests: No results for input(s): TSH, T4TOTAL, FREET4, T3FREE, THYROIDAB in the last 72 hours. Anemia Panel: No results for  input(s): VITAMINB12, FOLATE, FERRITIN, TIBC, IRON, RETICCTPCT in the last 72 hours. Sepsis Labs: Recent Labs  Lab 08/30/17 0541 08/30/17 0830  LATICACIDVEN 0.9 0.9    Recent Results (from the past 240 hour(s))  Culture, blood (Routine X 2) w Reflex to ID Panel     Status: None (Preliminary result)   Collection Time: 08/29/17 11:34 PM  Result Value Ref Range Status   Specimen Description   Final    BLOOD LEFT ANTECUBITAL Performed at [facility] [address] [place], [place] [address]    Special Requests   Final    BOTTLES DRAWN AEROBIC AND ANAEROBIC Blood Culture adequate volume Performed at [facility] [address] [place], [place] [address]    Culture  Final    NO GROWTH 1 DAY Performed at [facility], [place] [address] [place], [place] [address]    Report Status PENDING  Incomplete  Culture, blood (Routine X 2) w Reflex to ID Panel     Status: None (Preliminary result)   Collection Time: 08/30/17  5:41 AM  Result Value Ref Range Status   Specimen Description   Final    BLOOD LEFT ANTECUBITAL Performed at [facility] [address], [place], [place] [address]    Special Requests   Final    BOTTLES DRAWN AEROBIC ONLY Blood Culture adequate volume Performed at [facility] [address], [place], [place] [address]    Culture   Final    NO GROWTH 1 DAY Performed at [facility], [place] [address] [place], [place] [address]    Report Status PENDING  Incomplete  MRSA PCR Screening     Status: None   Collection Time: 08/30/17  7:01 AM  Result Value Ref Range Status   MRSA by PCR NEGATIVE NEGATIVE Final    Comment:        The GeneXpert MRSA Assay (FDA approved for NASAL specimens only), is one component of a comprehensive MRSA colonization surveillance program. It is not intended to diagnose MRSA infection nor to guide or monitor treatment for MRSA  infections. Performed at [facility], [place] [address] [place], [place] [address]     Radiology Studies: Dg Swallowing Func-speech Pathology  Result Date: 08/31/2017 Objective Swallowing Evaluation: Type of Study: MBS-Modified Barium Swallow Study  Patient Details Name: ESTI DEMELLO MRN: [id number] Date of Birth: 1933-02-01 Today's Date: 08/31/2017 Time: SLP Start Time (ACUTE ONLY): 1430 -SLP Stop Time (ACUTE ONLY): 1510 SLP Time Calculation (min) (ACUTE ONLY): 40 min Past Medical History: Past Medical History: Diagnosis Date . Arthritis  . Blind right eye  . Cataract   RT . CKD (chronic kidney disease)  . COPD (chronic obstructive pulmonary disease) ([place])  . Emphysema   followed by Dr. Joya Gaskins . GERD (gastroesophageal reflux disease)  . History of Doppler echocardiogram   carotid dopplers. (8/11) no significant stenosis . HTN (hypertension)  . Hx of echocardiogram 2015  Echo (08/2013):  EF 60-65%, no RWMA, Gr 1 DD, AVR ok (mean 5 mmHg), mild MR, PASP 22 mmHg . Hyperlipidemia  . Hypothyroidism  . Pulmonary embolism ([place]) 2010  She is no onger taking coumadin . S/P aortic valve replacement with bioprosthetic valve   ehco (8/11) with EF 55-60%, mild-mod MR, mild-mod TR, poorly visualized bioprosthetic aortic valve but no significant regurgitation and gradient not significanly elevated.   . S/P thoracic aortic aneurysm repair 1/10  also with bioprosthetic aortic valve prelacement [facility]). in 10/10 she has a descending thoraic aorta anuerysm and abdominal reapir [facility]). Vascular urgeon is Dr. Lunette Stands.  Marland Kitchen SVT (supraventricular tachycardia) (HCC)   HOLTER MONITOR AFTER SURGERY  Past Surgical History: Past Surgical History: Procedure Laterality Date . ANOMALOUS PULMONARY VENOUS RETURN REPAIR   . BACK SURGERY  1970  . EYE SURGERY    AS BABY- RT EYE  POOR SIGHT . INTRAMEDULLARY (IM) NAIL INTERTROCHANTERIC Right 04/20/2017  Procedure: INTRAMEDULLARY (IM) NAIL INTERTROCHANTRIC;  Surgeon: Leandrew Koyanagi, MD;  Location: [facility];  Service: Orthopedics;  Laterality: Right; . LUMBAR LAMINECTOMY/DECOMPRESSION MICRODISCECTOMY  05/31/2011  Procedure: LUMBAR LAMINECTOMY/DECOMPRESSION MICRODISCECTOMY;  Surgeon: Elaina Hoops, MD;  Location: [facility];  Service: Neurosurgery;  Laterality: Bilateral;  Bilateral Lumbar  three-four decompressionj lumbar laminectomy  . THORACIC AORTIC ANEURYSM REPAIR  1/10  VALVE ALSO DONE . THORACOABDOMINAL AORTIC ANEURYSM REPAIR  01/28/09  repair . TONSILLECTOMY   HPI: 84 yo female adm to [facility] respiratory deficits  - PMH + for CXR 08/29/2017 ? pna - left lung base ATX/scarring vs infiltrate - left pleural effusion, emphysema.  CT showed atrophy 08/29/2017.   MBS 06/29/2017 normal op swallow - flash penetration, concern for risk for post=prandial aspiration - pt could not swallow tablet.   Subjective: pt awake in chair Assessment / Plan / Recommendation CHL IP CLINICAL IMPRESSIONS 08/31/2017 Clinical Impression Mild oropharyngeal dysphagia without aspiration of any consistency tested.  Pt with oral coordination deficits resulting in premature spillage of boluses into pharynx = hitting pyriform sinus prior to swallow trigger.  Trace laryngeal penetration of thin= observed that cleared with same swallow.   Pt with delayed oral with premature spillage of pudding, cracker to vallecular space - which may increase aspiration risk.  Prominent CP noted -barium tablet appeared to lodge at distal esophagus - without awareness.  Further liquids aided clearance.  Per MD note from OP MBS 06/2017 - pt had mild esophageal dysmotlity.  Of note, pt coughed twice during MBS without bairum visualized in larynx.   Recommend continue dys3/thin diet with strict precautions.  SLP Visit Diagnosis Dysphagia, oropharyngeal phase (R13.12) Attention and concentration deficit following -- Frontal lobe and executive function deficit following -- Impact on safety and function Mild aspiration risk   CHL IP TREATMENT RECOMMENDATION  08/31/2017 Treatment Recommendations Therapy as outlined in treatment plan below   Prognosis 08/31/2017 Prognosis for Safe Diet Advancement Fair Barriers to Reach Goals -- Barriers/Prognosis Comment -- CHL IP DIET RECOMMENDATION 08/31/2017 SLP Diet Recommendations Dysphagia 3 (Mech soft) solids;Thin liquid Liquid Administration via Straw Medication Administration Whole meds with puree Compensations Slow rate;Small sips/bites Postural Changes Remain semi-upright after after feeds/meals (Comment);Seated upright at 90 degrees   CHL IP OTHER RECOMMENDATIONS 08/31/2017 Recommended Consults -- Oral Care Recommendations Oral care BID Other Recommendations --   CHL IP FOLLOW UP RECOMMENDATIONS 08/31/2017 Follow up Recommendations None   CHL IP FREQUENCY AND DURATION 08/31/2017 Speech Therapy Frequency (ACUTE ONLY) min 1 x/week Treatment Duration 1 week      CHL IP ORAL PHASE 08/31/2017 Oral Phase Impaired Oral - Pudding Teaspoon -- Oral - Pudding Cup -- Oral - Honey Teaspoon -- Oral - Honey Cup -- Oral - Nectar Teaspoon Premature spillage Oral - Nectar Cup -- Oral - Nectar Straw Premature spillage Oral - Thin Teaspoon Premature spillage Oral - Thin Cup -- Oral - Thin Straw Premature spillage Oral - Puree Premature spillage;Delayed oral transit Oral - Mech Soft -- Oral - Regular Delayed oral transit;Decreased bolus cohesion;Premature spillage Oral - Multi-Consistency -- Oral - Pill Premature spillage Oral Phase - Comment --  CHL IP PHARYNGEAL PHASE 08/31/2017 Pharyngeal Phase Impaired Pharyngeal- Pudding Teaspoon -- Pharyngeal -- Pharyngeal- Pudding Cup -- Pharyngeal -- Pharyngeal- Honey Teaspoon -- Pharyngeal -- Pharyngeal- Honey Cup -- Pharyngeal -- Pharyngeal- Nectar Teaspoon Delayed swallow initiation-pyriform sinuses Pharyngeal -- Pharyngeal- Nectar Cup -- Pharyngeal -- Pharyngeal- Nectar Straw Delayed swallow initiation-pyriform sinuses Pharyngeal -- Pharyngeal- Thin Teaspoon Delayed swallow initiation-pyriform  sinuses;Penetration/Aspiration before swallow Pharyngeal Material enters airway, remains ABOVE vocal cords then ejected out Pharyngeal- Thin Cup -- Pharyngeal -- Pharyngeal- Thin Straw Delayed swallow initiation-pyriform sinuses;Penetration/Aspiration during swallow Pharyngeal Material enters airway, remains ABOVE vocal cords then ejected out Pharyngeal- Puree Delayed swallow initiation-vallecula Pharyngeal -- Pharyngeal- Mechanical Soft -- Pharyngeal --  Pharyngeal- Regular Delayed swallow initiation-vallecula Pharyngeal -- Pharyngeal- Multi-consistency -- Pharyngeal -- Pharyngeal- Pill WFL Pharyngeal -- Pharyngeal Comment --  CHL IP CERVICAL ESOPHAGEAL PHASE 08/31/2017 Cervical Esophageal Phase Impaired Pudding Teaspoon -- Pudding Cup -- Honey Teaspoon -- Honey Cup -- Nectar Teaspoon -- Nectar Cup -- Nectar Straw -- Thin Teaspoon -- Thin Cup -- Thin Straw -- Puree -- Mechanical Soft -- Regular -- Multi-consistency -- Pill -- Cervical Esophageal Comment prominent cricopharyngeus, barium taken with thin appeared to lodge at distal esophagus without pt sensation, further boluses of thin helpful to clear,  per prior MBS - MD documented "mild esophageal dysmotility" No flowsheet data found. Macario Golds 08/31/2017, 4:12 PM  Luanna Salk, [facility] SLP [phone]             Scheduled Meds: . ALPRAZolam  0.5 mg Oral TID  . aspirin EC  81 mg Oral Daily  . divalproex  250 mg Oral QHS  . donepezil  10 mg Oral QHS  . enoxaparin (LOVENOX) injection  30 mg Subcutaneous Daily  . ipratropium-albuterol  3 mL Nebulization BID  . levothyroxine  125 mcg Oral QAC breakfast  . memantine  10 mg Oral BID  . methylPREDNISolone (SOLU-MEDROL) injection  60 mg Intravenous BID  . mometasone-formoterol  2 puff Inhalation BID  . QUEtiapine  12.5 mg Oral QHS  . sertraline  75 mg Oral Daily   And  . sertraline  50 mg Oral QHS   Continuous Infusions: . famotidine (PEPCID) IV    . meropenem (MERREM) IV    . meropenem (MERREM)  IV Stopped (08/31/17 2035)  . sodium chloride      LOS: 3 days   Kerney Elbe, DO [facility] Pager [phone]  If 7PM-7AM, please contact night-coverage [email or web address] Password [email or web address] 09/01/2017, 8:21 AM

## 2017-09-01 NOTE — Evaluation (Signed)
Physical Therapy Evaluation Patient Details Name: MAJESTIC MOLONY MRN: [id number] DOB: 1932/08/30 Today's Date: 09/01/2017   History of Present Illness  84 yo female admitted with acute on chronic respiratory failure. hx of COPD, R eye blindness, R hip IM nail 2018, CKD, PE, DVT, AVR, dementia  Clinical Impression  On eval, pt required Mod assist for mobility. She was able to stand and take a few side steps along side of the bed. O2 sat dropped on RA with activity so replaced [place] O2. At end of session, O2 91% on 3L [place] O2.  She remains at risk for falls when mobilizing. No family present during session. Recommend SNF.     Follow Up Recommendations SNF    Equipment Recommendations  None recommended by PT    Recommendations for Other Services       Precautions / Restrictions Precautions Precautions: Fall Precaution Comments: monitor O2 sats Restrictions Weight Bearing Restrictions: No      Mobility  Bed Mobility Overal bed mobility: Needs Assistance Bed Mobility: Supine to Sit;Sit to Supine     Supine to sit: Mod assist Sit to supine: Mod assist   General bed mobility comments: Assist for trunk and LEs. Utilized bedpad for scooting, positioning. Cues for safety, technique.   Transfers Overall transfer level: Needs assistance Equipment used: Rolling walker (2 wheeled) Transfers: Sit to/from Stand Sit to Stand: Mod assist         General transfer comment: Assist to rise, stabilize, control descent. VCs safety, technique, hand placement.   Ambulation/Gait Ambulation/Gait assistance: Mod assist           General Gait Details: side steps along side of bed with a RW. Multimodal cueing required. Assist to stabilize pt and to maneuver RW. Very unsteady and at risk for falls. Pt had difficulty moving LEs  Stairs            Wheelchair Mobility    Modified Rankin (Stroke Patients Only)       Balance Overall balance assessment: Needs assistance;History of  Falls         Standing balance support: Bilateral upper extremity supported Standing balance-Leahy Scale: Poor                               Pertinent Vitals/Pain Faces Pain Scale: No hurt    Home Living Family/patient expects to be discharged to:: Skilled nursing facility                      Prior Function Level of Independence: Needs assistance   Gait / Transfers Assistance Needed: working with therapy at ALF per chart (per family)  ADL's / Homemaking Assistance Needed: assist wit ADLs        Hand Dominance        Extremity/Trunk Assessment   Upper Extremity Assessment Upper Extremity Assessment: Generalized weakness    Lower Extremity Assessment Lower Extremity Assessment: Generalized weakness    Cervical / Trunk Assessment Cervical / Trunk Assessment: Kyphotic  Communication   Communication: HOH  Cognition Arousal/Alertness: Awake/alert(but drowsy) Behavior During Therapy: WFL for tasks assessed/performed Overall Cognitive Status: History of cognitive impairments - at baseline                                        General Comments      Exercises  Assessment/Plan    PT Assessment Patient needs continued PT services  PT Problem List Decreased strength;Decreased balance;Decreased mobility;Decreased activity tolerance;Decreased knowledge of use of DME;Decreased cognition       PT Treatment Interventions DME instruction;Gait training;Therapeutic activities;Functional mobility training;Balance training;Patient/family education;Therapeutic exercise    PT Goals (Current goals can be found in the Care Plan section)  Acute Rehab PT Goals Patient Stated Goal: none stated PT Goal Formulation: Patient unable to participate in goal setting Time For Goal Achievement: 09/15/17 Potential to Achieve Goals: Fair    Frequency Min 2X/week   Barriers to discharge        Co-evaluation               AM-PAC PT  "6 Clicks" Daily Activity  Outcome Measure Difficulty turning over in bed (including adjusting bedclothes, sheets and blankets)?: Unable Difficulty moving from lying on back to sitting on the side of the bed? : Unable Difficulty sitting down on and standing up from a chair with arms (e.g., wheelchair, bedside commode, etc,.)?: Unable Help needed moving to and from a bed to chair (including a wheelchair)?: A Lot Help needed walking in hospital room?: A Lot Help needed climbing 3-5 steps with a railing? : Total 6 Click Score: 8    End of Session Equipment Utilized During Treatment: Gait belt;Oxygen Activity Tolerance: Patient tolerated treatment well Patient left: in bed;with bed alarm set;with call bell/phone within reach   PT Visit Diagnosis: Muscle weakness (generalized) (M62.81);Difficulty in walking, not elsewhere classified (R26.2);History of falling (Z91.81)    Time: 9562-1308 PT Time Calculation (min) (ACUTE ONLY): 16 min   Charges:   PT Evaluation $PT Eval Moderate Complexity: 1 Mod     PT G Codes:          Weston Anna, MPT Pager: [phone]

## 2017-09-01 NOTE — Care Management Important Message (Signed)
Important Message  Patient Details  Name: EMERY BINZ MRN: [id number] Date of Birth: 1932-11-24   Medicare Important Message Given:  Yes    Kerin Salen 09/01/2017, 1:13 [place] Message  Patient Details  Name: KAWTHAR ENNEN MRN: [id number] Date of Birth: 16-Jun-1932   Medicare Important Message Given:  Yes    Kerin Salen 09/01/2017, 1:13 PM

## 2017-09-01 NOTE — NC FL2 (Signed)
[place] LEVEL OF CARE SCREENING TOOL     IDENTIFICATION  Patient Name: Sarah Johnson Birthdate: April 12, 1933 Sex: female Admission Date (Current Location): 08/29/2017  [facility] and [place] Number:  Herbalist and Address:  [facility],  [place], [place]      Provider Number: [id number]  Attending Physician Name and Address:  Kerney Elbe, DO  Relative Name and Phone Number:       Current Level of Care: Hospital Recommended Level of Care: [place] Prior Approval Number:    Date Approved/Denied:   PASRR Number: [id number]  Discharge Plan: SNF    Current Diagnoses: Patient Active Problem List   Diagnosis Date Noted  . Sacral pressure ulcer 08/30/2017  . Dementia 08/29/2017  . Depression 08/29/2017  . Acute on chronic respiratory failure with hypoxia ([place]) 08/29/2017  . Chronic diastolic CHF (congestive heart failure) ([place]) 08/29/2017  . Closed displaced intertrochanteric fracture of right femur ([place]) 04/18/2017  . Preoperative evaluation of a medical condition to rule out surgical contraindications (TAR required)   . Cellulitis 11/28/2015  . Cellulitis of right hand 11/28/2015  . Hypothyroidism 11/28/2015  . COPD with acute exacerbation ([place]) 09/25/2015  . Leukocytosis 02/11/2015  . Hypokalemia 02/11/2015  . Hypomagnesemia 02/11/2015  . Deep vein thrombosis (DVT) of right lower extremity ([place]) 02/08/2015  . Acute renal failure superimposed on stage 3 chronic kidney disease ([place]) 02/05/2015  . Anemia of chronic renal failure, stage 2 (mild) 02/05/2015  . Left lower lobe pneumonia ([place]) 02/05/2015  . Sepsis ([place])   . History of Clostridium difficile colitis 05/09/2014  . HCAP (healthcare-associated pneumonia) 03/25/2014  . History of aortic valve replacement 11/05/2010  . GERD (gastroesophageal reflux disease) 06/09/2010  . HLD (hyperlipidemia) 11/04/2009  . HTN (hypertension) 11/04/2009     Orientation RESPIRATION BLADDER Height & Weight     Self  O2 Incontinent Weight: 125 lb 7.1 oz (56.9 kg) Height:  5\' 2"  (157.5 cm)  BEHAVIORAL SYMPTOMS/MOOD NEUROLOGICAL BOWEL NUTRITION STATUS      Incontinent Diet(see dc summary)  AMBULATORY STATUS COMMUNICATION OF NEEDS Skin   Extensive Assist Verbally Other (Comment)(PressureInjuryDTIwithBlister  Location: Mid Coccyx  Foam Dressing PRN;; PressureInjuryDeepTissueInjury Location: Right Buttocks  Foam Dressing PRN;; PressureInjuryDeepTissueInjury Location: Heel Location Orientation: Posterior;Right Foam   )                       Personal Care Assistance Level of Assistance  Bathing, Feeding, Dressing Bathing Assistance: Maximum assistance Feeding assistance: Maximum assistance Dressing Assistance: Maximum assistance     Functional Limitations Info  Sight, Speech, Hearing Sight Info: Impaired(patient blind in right eye) Hearing Info: Impaired Speech Info: Adequate    SPECIAL CARE FACTORS FREQUENCY  PT (By licensed PT), OT (By licensed OT)     PT Frequency: 5x/week OT Frequency: 5x/week            Contractures      Additional Factors Info  Code Status, Allergies Code Status Info: DNR Allergies Info: Codeine; Penicillins           Current Medications (09/01/2017):  This is the current hospital active medication list Current Facility-Administered Medications  Medication Dose Route Frequency Provider Last Rate Last Dose  . ALPRAZolam Duanne Moron) tablet 0.5 mg  0.5 mg Oral Q4H PRN Sheikh, Georgina Quint Latif, DO      . ALPRAZolam Duanne Moron) tablet 0.5 mg  0.5 mg Oral TID Raiford Noble Latif, DO   0.5  mg at 09/01/17 0928  . aspirin EC tablet 81 mg  81 mg Oral Daily Raiford Noble [place], DO   81 mg at 09/01/17 6269  . cefTRIAXone (ROCEPHIN) 1 g in sodium chloride 0.9 % 100 mL IVPB  1 g Intravenous Q24H Sheikh, Omair Latif, DO      . divalproex (DEPAKOTE ER) 24 hr tablet 250 mg  250 mg Oral QHS Sheikh, Georgina Quint [place], DO    250 mg at 08/31/17 2001  . donepezil (ARICEPT) tablet 10 mg  10 mg Oral QHS Sheikh, Georgina Quint [place], DO   10 mg at 08/31/17 2001  . enoxaparin (LOVENOX) injection 30 mg  30 mg Subcutaneous Daily Cristal Ford, DO   30 mg at 09/01/17 4854  . [START ON 09/02/2017] famotidine (PEPCID) tablet 20 mg  20 mg Oral Daily Sheikh, Omair Latif, DO      . ipratropium-albuterol (DUONEB) 0.5-2.5 (3) MG/3ML nebulizer solution 3 mL  3 mL Nebulization Q2H PRN Mikhail, Maryann, DO      . ipratropium-albuterol (DUONEB) 0.5-2.5 (3) MG/3ML nebulizer solution 3 mL  3 mL Nebulization BID Sheikh, Omair Latif, DO      . levothyroxine (SYNTHROID, LEVOTHROID) tablet 125 mcg  125 mcg Oral QAC breakfast Raiford Noble [place], [place]   125 mcg at 09/01/17 0825  . memantine (NAMENDA) tablet 10 mg  10 mg Oral BID Raiford Noble [place], DO   10 mg at 09/01/17 6270  . methylPREDNISolone sodium succinate (SOLU-MEDROL) 40 mg/mL injection 40 mg  40 mg Intravenous BID Sheikh, Omair Latif, DO      . mometasone-formoterol [facility]) 100-5 MCG/ACT inhaler 2 puff  2 puff Inhalation BID Raiford Noble [place], DO   2 puff at 08/31/17 2223  . QUEtiapine (SEROQUEL) tablet 12.5 mg  12.5 mg Oral QHS Sheikh, Georgina Quint [place], DO   12.5 mg at 08/31/17 2001  . sertraline (ZOLOFT) tablet 75 mg  75 mg Oral Daily Minda Ditto, RPH   75 mg at 09/01/17 3500   And  . sertraline (ZOLOFT) tablet 50 mg  50 mg Oral QHS Green, Terri L, RPH   50 mg at 08/31/17 2000  . sodium chloride 0.9 % bolus 500 mL  500 mL Intravenous Once Cristal Ford, DO         Discharge Medications: Please see discharge summary for a list of discharge medications.  Relevant Imaging Results:  Relevant Lab Results:   Additional Information SSN [id number]  Burnis Medin, LCSW

## 2017-09-01 NOTE — Progress Notes (Signed)
Received report from Sara RN. I agree with previous assessment. Will continue to monitor closely.  

## 2017-09-01 NOTE — Progress Notes (Signed)
  Speech Language Pathology Treatment: Dysphagia  Patient Details Name: Sarah Johnson MRN: [id number] DOB: [birth year] Today's Date: 09/01/2017 Time: 1194-1740 SLP Time Calculation (min) (ACUTE ONLY): 12 min  Assessment / Plan / Recommendation Clinical Impression  RN reports pt tolerating po but not wanting to eat solids.  Apparently she consumes juices well.  Provided pt with a Boost Breeze = she consumed the entire container within 5 minutes with good tolerance.  Pt does have h/o esophageal motility issues per Dr Rockne Menghini on [facility] 06/2017 and therefore advised her to stay upright after meals/intake for a while and to consume liquids during meals.  Concern present for pt's nutritional status impacting wound healing given poor intake.   Recommend RD referral to help maximize nutrition.  Informed RN that pt enjoyed [employer] as she stated pt did not consume ? Ensure earlier.   No SLP follow up indicated at this time as mitigation strategies in place and pt MBS completed.  Thanks for this consult.    HPI HPI: 84 yo female adm to [facility] respiratory deficits  - PMH + for CXR 08/29/2017 ? pna - left lung base ATX/scarring vs infiltrate - left pleural effusion, emphysema.  CT showed atrophy 08/29/2017.   MBS 06/29/2017 normal op swallow - flash penetration, concern for risk for post=prandial aspiration - pt could not swallow tablet.        SLP Plan  Continue with current plan of care(follow up after esophagram)       Recommendations  Diet recommendations: Dysphagia 3 (mechanical soft);Thin liquid Liquids provided via: Straw;Cup Medication Administration: Whole meds with puree Supervision: Patient able to self feed Compensations: Slow rate;Small sips/bites(drink liquids t/o meals) Postural Changes and/or Swallow Maneuvers: Seated upright 90 degrees                Oral Care Recommendations: Oral care QID Follow up Recommendations: None SLP Visit Diagnosis: Dysphagia, pharyngoesophageal phase  (R13.14);Dysphagia, unspecified (R13.10) Plan: Continue with current plan of care(follow up after esophagram)       GO                Macario Golds 09/01/2017, 3:36 PM Luanna Salk, [place] [facility] SLP [phone]

## 2017-09-02 ENCOUNTER — Inpatient Hospital Stay (HOSPITAL_COMMUNITY): Payer: Medicare Other

## 2017-09-02 DIAGNOSIS — L89612 Pressure ulcer of right heel, stage 2: Secondary | ICD-10-CM | POA: Diagnosis not present

## 2017-09-02 DIAGNOSIS — Z743 Need for continuous supervision: Secondary | ICD-10-CM | POA: Diagnosis not present

## 2017-09-02 DIAGNOSIS — L89312 Pressure ulcer of right buttock, stage 2: Secondary | ICD-10-CM | POA: Diagnosis not present

## 2017-09-02 DIAGNOSIS — R0602 Shortness of breath: Secondary | ICD-10-CM | POA: Diagnosis not present

## 2017-09-02 DIAGNOSIS — F418 Other specified anxiety disorders: Secondary | ICD-10-CM | POA: Diagnosis not present

## 2017-09-02 DIAGNOSIS — N179 Acute kidney failure, unspecified: Secondary | ICD-10-CM | POA: Diagnosis not present

## 2017-09-02 DIAGNOSIS — F3289 Other specified depressive episodes: Secondary | ICD-10-CM | POA: Diagnosis not present

## 2017-09-02 DIAGNOSIS — E039 Hypothyroidism, unspecified: Secondary | ICD-10-CM | POA: Diagnosis not present

## 2017-09-02 DIAGNOSIS — R1312 Dysphagia, oropharyngeal phase: Secondary | ICD-10-CM | POA: Diagnosis not present

## 2017-09-02 DIAGNOSIS — M6281 Muscle weakness (generalized): Secondary | ICD-10-CM | POA: Diagnosis not present

## 2017-09-02 DIAGNOSIS — R278 Other lack of coordination: Secondary | ICD-10-CM | POA: Diagnosis not present

## 2017-09-02 DIAGNOSIS — E440 Moderate protein-calorie malnutrition: Secondary | ICD-10-CM | POA: Diagnosis not present

## 2017-09-02 DIAGNOSIS — J189 Pneumonia, unspecified organism: Secondary | ICD-10-CM | POA: Diagnosis not present

## 2017-09-02 DIAGNOSIS — M1991 Primary osteoarthritis, unspecified site: Secondary | ICD-10-CM | POA: Diagnosis not present

## 2017-09-02 DIAGNOSIS — F0390 Unspecified dementia without behavioral disturbance: Secondary | ICD-10-CM | POA: Diagnosis not present

## 2017-09-02 DIAGNOSIS — F062 Psychotic disorder with delusions due to known physiological condition: Secondary | ICD-10-CM | POA: Diagnosis not present

## 2017-09-02 DIAGNOSIS — J449 Chronic obstructive pulmonary disease, unspecified: Secondary | ICD-10-CM | POA: Diagnosis not present

## 2017-09-02 DIAGNOSIS — R41841 Cognitive communication deficit: Secondary | ICD-10-CM | POA: Diagnosis not present

## 2017-09-02 DIAGNOSIS — R2681 Unsteadiness on feet: Secondary | ICD-10-CM | POA: Diagnosis not present

## 2017-09-02 DIAGNOSIS — Z952 Presence of prosthetic heart valve: Secondary | ICD-10-CM | POA: Diagnosis not present

## 2017-09-02 DIAGNOSIS — I10 Essential (primary) hypertension: Secondary | ICD-10-CM | POA: Diagnosis not present

## 2017-09-02 DIAGNOSIS — R279 Unspecified lack of coordination: Secondary | ICD-10-CM | POA: Diagnosis not present

## 2017-09-02 DIAGNOSIS — L89159 Pressure ulcer of sacral region, unspecified stage: Secondary | ICD-10-CM | POA: Diagnosis not present

## 2017-09-02 DIAGNOSIS — I5032 Chronic diastolic (congestive) heart failure: Secondary | ICD-10-CM | POA: Diagnosis not present

## 2017-09-02 DIAGNOSIS — J441 Chronic obstructive pulmonary disease with (acute) exacerbation: Secondary | ICD-10-CM | POA: Diagnosis not present

## 2017-09-02 DIAGNOSIS — E785 Hyperlipidemia, unspecified: Secondary | ICD-10-CM | POA: Diagnosis not present

## 2017-09-02 DIAGNOSIS — S72001D Fracture of unspecified part of neck of right femur, subsequent encounter for closed fracture with routine healing: Secondary | ICD-10-CM | POA: Diagnosis not present

## 2017-09-02 DIAGNOSIS — J9600 Acute respiratory failure, unspecified whether with hypoxia or hypercapnia: Secondary | ICD-10-CM | POA: Diagnosis not present

## 2017-09-02 DIAGNOSIS — K219 Gastro-esophageal reflux disease without esophagitis: Secondary | ICD-10-CM | POA: Diagnosis not present

## 2017-09-02 DIAGNOSIS — J9621 Acute and chronic respiratory failure with hypoxia: Secondary | ICD-10-CM | POA: Diagnosis not present

## 2017-09-02 DIAGNOSIS — R4586 Emotional lability: Secondary | ICD-10-CM | POA: Diagnosis not present

## 2017-09-02 LAB — CBC WITH DIFFERENTIAL/PLATELET
Basophils Absolute: 0 10*3/uL (ref 0.0–0.1)
Basophils Relative: 0 %
EOS PCT: 0 %
Eosinophils Absolute: 0 10*3/uL (ref 0.0–0.7)
HEMATOCRIT: 35.4 % — AB (ref 36.0–46.0)
Hemoglobin: 11.4 g/dL — ABNORMAL LOW (ref 12.0–15.0)
LYMPHS ABS: 1.2 10*3/uL (ref 0.7–4.0)
LYMPHS PCT: 13 %
MCH: 30.5 pg (ref 26.0–34.0)
MCHC: 32.2 g/dL (ref 30.0–36.0)
MCV: 94.7 fL (ref 78.0–100.0)
MONO ABS: 0.4 10*3/uL (ref 0.1–1.0)
Monocytes Relative: 4 %
Neutro Abs: 8.2 10*3/uL — ABNORMAL HIGH (ref 1.7–7.7)
Neutrophils Relative %: 83 %
PLATELETS: 154 10*3/uL (ref 150–400)
RBC: 3.74 MIL/uL — AB (ref 3.87–5.11)
RDW: 20.6 % — AB (ref 11.5–15.5)
WBC: 9.8 10*3/uL (ref 4.0–10.5)

## 2017-09-02 LAB — COMPREHENSIVE METABOLIC PANEL
ALT: 18 U/L (ref 14–54)
AST: 28 U/L (ref 15–41)
Albumin: 2.9 g/dL — ABNORMAL LOW (ref 3.5–5.0)
Alkaline Phosphatase: 43 U/L (ref 38–126)
Anion gap: 13 (ref 5–15)
BILIRUBIN TOTAL: 0.3 mg/dL (ref 0.3–1.2)
BUN: 23 mg/dL — ABNORMAL HIGH (ref 6–20)
CALCIUM: 9.7 mg/dL (ref 8.9–10.3)
CHLORIDE: 104 mmol/L (ref 101–111)
CO2: 26 mmol/L (ref 22–32)
CREATININE: 1.1 mg/dL — AB (ref 0.44–1.00)
GFR, EST AFRICAN AMERICAN: 53 mL/min — AB (ref >60)
GFR, EST NON AFRICAN AMERICAN: 45 mL/min — AB (ref >60)
Glucose, Bld: 120 mg/dL — ABNORMAL HIGH (ref 65–99)
Potassium: 3.8 mmol/L (ref 3.5–5.1)
Sodium: 143 mmol/L (ref 135–145)
TOTAL PROTEIN: 6.1 g/dL — AB (ref 6.5–8.1)

## 2017-09-02 LAB — MAGNESIUM: MAGNESIUM: 1.7 mg/dL (ref 1.7–2.4)

## 2017-09-02 LAB — PHOSPHORUS: PHOSPHORUS: 2.6 mg/dL (ref 2.5–4.6)

## 2017-09-02 MED ORDER — CEFDINIR 300 MG PO CAPS: 300.0000 mg | ORAL_CAPSULE | Freq: Two times a day (BID) | ORAL | 0 refills | Status: AC

## 2017-09-02 MED ORDER — PREDNISONE 10 MG (21) PO TBPK: ORAL_TABLET | ORAL | 0 refills | Status: AC

## 2017-09-02 MED ORDER — PREDNISONE 5 MG PO TABS: 5.0000 mg | ORAL_TABLET | Freq: Every day | ORAL | Status: AC

## 2017-09-02 MED ORDER — CEFDINIR 300 MG PO CAPS: 300.0000 mg | ORAL_CAPSULE | Freq: Two times a day (BID) | ORAL | Status: DC

## 2017-09-02 MED ORDER — ALPRAZOLAM 0.5 MG PO TABS: 0.5000 mg | ORAL_TABLET | ORAL | 0 refills | Status: AC | PRN

## 2017-09-02 MED ORDER — ENSURE ENLIVE PO LIQD
237.0000 mL | ORAL | Status: DC
  Administered 2017-09-02: 237 mL via ORAL

## 2017-09-02 MED ORDER — ALPRAZOLAM 0.5 MG PO TABS: 0.5000 mg | ORAL_TABLET | Freq: Three times a day (TID) | ORAL | 0 refills | Status: AC

## 2017-09-02 MED ORDER — ENSURE ENLIVE PO LIQD: 237.0000 mL | ORAL | 12 refills | Status: AC

## 2017-09-02 MED ORDER — FAMOTIDINE 20 MG PO TABS: 20.0000 mg | ORAL_TABLET | Freq: Every day | ORAL | 0 refills | Status: AC

## 2017-09-02 NOTE — Progress Notes (Signed)
Report given to [facility] at [facility].  Barbee [place]. Brigitte Pulse, RN

## 2017-09-02 NOTE — Progress Notes (Signed)
Initial Nutrition Assessment  DOCUMENTATION CODES:   Not applicable  INTERVENTION:  - Will order Ensure Enlive once/day, this supplement provides 350 kcal and 20 grams of protein. - Continue to encourage PO intakes. - Tech/RN to provide feeding assistance.   NUTRITION DIAGNOSIS:   Inadequate oral intake related to lethargy/confusion(need for feeding assistance) as evidenced by meal completion < 50%, other (comment)(tech's report).  GOAL:   Patient will meet greater than or equal to 90% of their needs  MONITOR:   PO intake, Supplement acceptance, Weight trends, Labs  REASON FOR ASSESSMENT:   Consult Assessment of nutrition requirement/status  ASSESSMENT:   84 y.o. female with medical history significant of splenectomy, hypertension, hyperlipidemia, COPD, GERD, hypothyroidism, depression with anxiety, right eye blindness, CKD-3, PE/DVT not on anticoagulants, s/p of aortic valve replacement with bioprosthetic valve, s/p of thoracic aortic aneurysm repair, dementia, who presented with altered mental status and cough. Admitted for Acute on Chronic Respiratory Failure and found to have a COPD Exacerbation and likely PNA. SLP evaluated and recommending Dysphagia 3 Diet.  Antibiotics are currently being de-escalated.   BMI indicates normal weight. Per chart review, pt consumed 0% of all meals on 5/7. No other documentation until this AM when it was documented that she ate 25% of breakfast. Spoke with tech who reports that this consisted of a cup of orange juice, an entire bowl of oatmeal, and a few bites of eggs. Tech reports that pt was set up at 90 degree angle and did not have any difficulties chewing/swallowing, no coughing. She feels that pt does fine with meals as long as those providing feeding assistance are patient.   No family/visitors present and pt noted to be a/o to self. She was sleeping during RD visit and did not awake even during NFPE.   Medications reviewed; 20 mg oral  Pepcid/day, 125 mcg oral Synthroid/day, 40 mg Solu-medrol BID. Labs reviewed; BUN: 23 mg/dL, creatinine: 1.1 mg/dL, GFR: 45 mL/min.      NUTRITION - FOCUSED PHYSICAL EXAM:  Completed/assessed with no muscle and no fat wasting noted.  Diet Order:   Diet Order           DIET DYS 3 Room service appropriate? Yes; Fluid consistency: Thin  Diet effective now          EDUCATION NEEDS:   No education needs have been identified at this time  Skin:  Skin Assessment: Skin Integrity Issues: Skin Integrity Issues:: DTI DTI: R buttocks, R heel  Last BM:  5/9  Height:   Ht Readings from Last 1 Encounters:  08/30/17 5\' 2"  (1.575 m)    Weight:   Wt Readings from Last 1 Encounters:  08/30/17 125 lb 7.1 oz (56.9 kg)    Ideal Body Weight:  50 kg  BMI:  Body mass index is 22.94 kg/m.  Estimated Nutritional Needs:   Kcal:  1420-1600 (25-28 kcal/kg)  Protein:  57-68 grams (1-1.2 grams/kg)  Fluid:  >/= 1.5 L/day     Jarome Matin, MS, RD, LDN, [facility] Inpatient Clinical Dietitian Pager # [phone] After hours/weekend pager # [phone]

## 2017-09-02 NOTE — Clinical Social Work Placement (Signed)
Patient received and accepted bed offer at [facility]. Facility aware of patient's discharge and confirmed patient's bed offer. PTAR contacted, patient's family notified. Patient's RN can call report to [phone] Room 207, packet complete. CSW signing off, no other needs identified at this time.   CLINICAL SOCIAL WORK PLACEMENT  NOTE  Date:  09/02/2017  Patient Details  Name: Sarah Johnson MRN: [id number] Date of Birth: 22-Jan-1933  Clinical Social Work is seeking post-discharge placement for this patient at the [place] level of care (*CSW will initial, date and re-position this form in  chart as items are completed):  Yes   Patient/family provided with [place] Work Department's list of facilities offering this level of care within the geographic area requested by the patient (or if unable, by the patient's family).  Yes   Patient/family informed of their freedom to choose among providers that offer the needed level of care, that participate in Medicare, Medicaid or managed care program needed by the patient, have an available bed and are willing to accept the patient.  Yes   Patient/family informed of [facility]'s ownership interest in [facility] and [facility], as well as of the fact that they are under no obligation to receive care at these facilities.  PASRR submitted to EDS on       PASRR number received on       Existing PASRR number confirmed on 09/01/17     FL2 transmitted to all facilities in geographic area requested by pt/family on 09/01/17     FL2 transmitted to all facilities within larger geographic area on       Patient informed that his/her managed care company has contracts with or will negotiate with certain facilities, including the following:        Yes   Patient/family informed of bed offers received.  Patient chooses bed at [place], [place]     Physician recommends and patient chooses bed at        Patient to be transferred to [place] on 09/02/17.  Patient to be transferred to facility by PTAR     Patient family notified on 09/02/17 of transfer.  Name of family member notified:  Berniece Pap     PHYSICIAN       Additional Comment:    _______________________________________________ Burnis Medin, LCSW 09/02/2017, 3:20 PM

## 2017-09-02 NOTE — Discharge Summary (Signed)
Physician Discharge Summary  Sarah Johnson KGU:[id number] DOB: 10-19-1932 DOA: 08/29/2017  PCP: Sarah Gravel, MD  Admit date: 08/29/2017 Discharge date: 09/02/2017  Admitted From: SNF Disposition: SNF  Recommendations for Outpatient Follow-up:  1. Follow up with PCP in 1-2 weeks 2. Please obtain CMP/CBC, Mag, Phos in one week 3. Please follow up on the following pending results:  Home Health: No Equipment/Devices: None    Discharge Condition: Stable  CODE STATUS: DO NOT RESUSUCITATE Diet recommendation: Dysphagia 3 with Thin Liquids  Brief/Interim Summary: Sarah Johnson a 84 y.o.femalewith medical history significant ofsplenectomy, hypertension, hyperlipidemia, COPD, GERD, hypothyroidism, depression with anxiety, right eye blindness, CKD-3, PE/DVT not on anticoagulants,s/p ofaortic valve replacement with bioprosthetic valve,s/p ofthoracic aortic aneurysm repair, dementia, who presented with altered mental status and cough. Admitted for Acute on Chronic Respiratory Failure and found to have a COPD Exacerbation and likely an HCAP. SLP evaluated and recommending Dysphagia 3 Diet.  Antibiotics are currently being de-escalated and changed to po.  She has improved and was deemed medically stable to be discharged back to skilled nursing facility at this time.  She will need to follow-up with PCP and repeat a chest x-ray in 3-6 weeks.  Discharge Diagnoses:  Principal Problem:   Acute on chronic respiratory failure with hypoxia (HCC) Active Problems:   HLD (hyperlipidemia)   HTN (hypertension)   GERD (gastroesophageal reflux disease)   History of aortic valve replacement   HCAP (healthcare-associated pneumonia)   Acute renal failure superimposed on stage 3 chronic kidney disease (HCC)   COPD with acute exacerbation (HCC)   Hypothyroidism   Dementia   Depression   Chronic diastolic CHF (congestive heart failure) (HCC)   Sacral pressure ulcer  Acute on Chronic Respiratory  Failure with hypoxia/hypercapnia/ COPD exacerbation with Concomitant PNA -Patient's daughter stated to the Admitting Physician that she was told her mother is a CO2 retainer by pulmonology and should be on 2L at all times -Upon admission, ABG showed pH 7.445, PCO2 47.9, PO2 71.4 -CXRreviewed, showing possible infiltrate left lung base on Admission -Influenza A/B PCR negative -Patient was started on broad-spectrum antibiotics antibiotics as below and will continue IV Meropenem given Splenectomy  -Continue supplemental oxygen to maintain saturations above 92%, Solu-Medrol IV 60 mg BID to 40 mg IV twice daily and will transition to po Prednisone Taper and after initial inhibitors completed we will resume her home 5 mg prednisone daily, DuoNeb Nebulizer treatments scheduled 3 mL's 3 times daily as well as 3 mL's every 2 as needed for wheezing or shortness of Breath -Continue antibiotics as below -Blood Cx x2 Negative at 3 days -Continue Dulera 2 puffs elevations twice daily -Added Flutter Valve and Incentive Spirometery  -Repeat CXR this AM showed Left Lung Base Scarring and/or Atelectasis and no Convincing Acute Cardiopulmonary Abnormality   Acute Metabolic Encephalopathy, improving  -Possibly secondary to hypoxia and pneumonia -Was sleepy yesterday but awoken and alert and oriented  -Continue with Delirium Precautions  Health Care Associated Pneumonia -CXR asAbove -Patient placed on broad-spectrum antibiotics with Vancomycin and Merrem for possible aspiration pneumonia versus HCAP(patient with penicillin allergy); IV vancomycin  now discontinued and will change IV Meropenem to IV Ceftriaxone and now de-escalated to po Cefdinir to complete 7 day course -Strep pneumonia urine antigen and legionella urine antigen -SLP commending Dysphagia 3 Diet -Repeat CXR this AM showed Left lung base scarring and/or atelectasis. No convincing acute cardiopulmonary abnormality.  Hyperlipidemia -Statin  held but MAR indicates the patient no longer takes this.  We will have to clarify  Hypertension -BP was mildly soft on admission, currently normotensive and is 143/71 -Home medications held and upon further review is not taking metoprolol any longer.  GERD -No longer taking pantoprazole per MAR.  Will continue Famotidine 20 mg p.o. daily   Acute kidney injury on chronic kidney disease, stage III, improved -Creatinine on admission 2.83 (last creatinine in March 2019 was 2.31) -BUN/creatinine now improved to 23/1.10 -Baseline Creatine appears to be 1.3-1.5 -Discontinued IVF at this time:   -Repeat CMP in a.m. and may resume patient's home Lasix in the next couple days if Creatinine is Stable but will defer to Physician at SNF  Hypothyroidism -IV Levothyroxine Discontinued and resumed Home Levothyroxine dose of 125 mcg Daily  Dementia with Behavioral Disturbance -Home Medications were on hold but resumed now that she is on Dysphagia 3 Diet -IV Lorazepam discontinued  -Resumed Donepezil 10 mg p.o. Nightly, Memantine 10 mg p.o. twice daily -Continue with Quetiapine 12.5 mg p.o. Nightly and Divalproex 250 mg qHS -Stable and Improving  Anxiety/Depression -Home medications initially held due to lethargy but patient is improved resumed -Continue with Alprazolam 0.5 mg p.o. 3 times daily daily, Alprazolam 0.5 mg p.o. every 4 as needed anxiety, Sertraline 75 mg in the a.m. and 50 mg nightly  PressureUlcer -WOC Nurse evaluated and wound is a DTI -WOC Nurse recommending "continuing the sacral foam dressing. Add to the treatment regimen Criticaid Clear (available in the stock room in a purple and white tube). This should be applied twice daily. Also, will add an order for an air mattress and turning right or left and limit the amount of time in the supine position." -Follow [place] at SNF  Hyponatremia, improved  -Mild at 147 and now Na+ is 143 -Likely from IV fluid hydration (Now  D/C'd) -Continue to monitor and repeat CMP at SNF  Normocytic Anemia -Patient's Hb/Hct Stable is 11.4/35.4 -Continue to monitor for S/Sx of Bleeding -Repeat CBC in AM  Chronic Grade 1 Diastolic Dysfunction -Currently Lasix is on hold due to her AKI.  She appears euvolemic on examination -BNP was 36.7 -We will defer to PCP to resume Lasix  History of a PE/DVT S/p Aortic Valve Replacement with bioprosthetic valve -Currently no longer taking Coumadin at this time -Follow-up as an outpatient with [facility] Valve  Discharge Instructions Discharge Instructions    Call MD for:  difficulty breathing, headache or visual disturbances   Complete by:  As directed    Call MD for:  extreme fatigue   Complete by:  As directed    Call MD for:  hives   Complete by:  As directed    Call MD for:  persistant dizziness or light-headedness   Complete by:  As directed    Call MD for:  persistant nausea and vomiting   Complete by:  As directed    Call MD for:  redness, tenderness, or signs of infection (pain, swelling, redness, odor or green/yellow discharge around incision site)   Complete by:  As directed    Call MD for:  severe uncontrolled pain   Complete by:  As directed    Call MD for:  temperature >100.4   Complete by:  As directed    Diet - low sodium heart healthy   Complete by:  As directed    Discharge instructions   Complete by:  As directed    Follow-up with PCP at skilled nursing facility.  Take all medications as  prescribed.  If symptoms change or worsen please return to the emergency room for evaluation.   Increase activity slowly   Complete by:  As directed      Allergies as of 09/02/2017      Reactions   Codeine Other (See Comments)   Pt. States it has been too long to remember the reaction    Penicillins Other (See Comments)   Pt. Does not recall the reaction. Tolerates cephalosporins Has patient had a PCN reaction causing immediate rash,  facial/tongue/throat swelling, SOB or lightheadedness with hypotension:  Has patient had a PCN reaction causing severe rash involving mucus membranes or skin necrosis: Has patient had a PCN reaction that required hospitalization  Has patient had a PCN reaction occurring within the last 10 years:  If all of the above answers are "NO", then may proceed with Cephalosporin use.      Medication List    STOP taking these medications   atorvastatin 20 MG tablet Commonly known as:  LIPITOR   furosemide 40 MG tablet Commonly known as:  LASIX   ibuprofen 200 MG tablet Commonly known as:  ADVIL,MOTRIN   metoprolol tartrate 25 MG tablet Commonly known as:  LOPRESSOR   oxyCODONE 5 MG immediate release tablet Commonly known as:  Oxy IR/ROXICODONE   pantoprazole 40 MG tablet Commonly known as:  PROTONIX     TAKE these medications   acetaminophen 500 MG tablet Commonly known as:  TYLENOL Take 500 mg by mouth every 6 (six) hours as needed for moderate pain.   ALPRAZolam 0.5 MG tablet Commonly known as:  XANAX Take 1 tablet (0.5 mg total) by mouth 3 (three) times daily.   ALPRAZolam 0.5 MG tablet Commonly known as:  XANAX Take 1 tablet (0.5 mg total) by mouth every 4 (four) hours as needed for anxiety (*May give 1 hour before or after scheduled dose).   aspirin EC 81 MG tablet Take 81 mg by mouth daily.   cefdinir 300 MG capsule Commonly known as:  OMNICEF Take 1 capsule (300 mg total) by mouth every 12 (twelve) hours for 4 doses. Start taking on:  2/63/7858   CERTAVITE/ANTIOXIDANTS PO Take 1 tablet by mouth daily.   divalproex 250 MG 24 hr tablet Commonly known as:  DEPAKOTE ER Take 250 mg by mouth at bedtime.   donepezil 10 MG tablet Commonly known as:  ARICEPT TAKE 1 TABLET (10 MG TOTAL) BY MOUTH AT BEDTIME.   famotidine 20 MG tablet Commonly known as:  PEPCID Take 1 tablet (20 mg total) by mouth daily. Start taking on:  09/03/2017   feeding supplement (ENSURE  ENLIVE) Liqd Take 237 mLs by mouth daily. Start taking on:  09/03/2017   ipratropium-albuterol 0.5-2.5 (3) MG/3ML Soln Commonly known as:  DUONEB Take 3 mLs by nebulization 2 (two) times daily. Dx: J44.9   levothyroxine 125 MCG tablet Commonly known as:  SYNTHROID, LEVOTHROID Take 125 mcg by mouth daily before breakfast.   memantine 10 MG tablet Commonly known as:  NAMENDA Take 10 mg by mouth 2 (two) times daily.   mometasone-formoterol 100-5 MCG/ACT Aero Commonly known as:  DULERA INHALE 2 PUFFS INTO THE LUNGS 2 TIMES DAILY. RINSE MOUTH AFTER EACH USE   predniSONE 10 MG (21) Tbpk tablet Commonly known as:  STERAPRED UNI-PAK 21 TAB Take 6 pills day 1, 5 pills day 2, 4 pills day 3, 3 pills day 4, 2 pills day 5, 1 pill day 6, and stop on day 7 What changed:  You were already taking a medication with the same name, and this prescription was added. Make sure you understand how and when to take each.   predniSONE 5 MG tablet Commonly known as:  DELTASONE Take 1 tablet (5 mg total) by mouth daily with breakfast. Resume after Sterapred Taper What changed:  additional instructions   PROAIR HFA 108 (90 Base) MCG/ACT inhaler Generic drug:  albuterol Inhale 2 puffs into the lungs every 6 (six) hours as needed. What changed:  reasons to take this   QUEtiapine 25 MG tablet Commonly known as:  SEROQUEL Take 12.5 mg by mouth at bedtime.   saccharomyces boulardii 250 MG capsule Commonly known as:  FLORASTOR Take 1 capsule (250 mg total) by mouth 2 (two) times daily.   sertraline 50 MG tablet Commonly known as:  ZOLOFT Take 50-75 mg by mouth 2 (two) times daily. Takes 75 mg in the morning and 50 mg at bedtime.   thiamine 100 MG tablet Take 1 tablet (100 mg total) by mouth daily.       Allergies  Allergen Reactions  . Codeine Other (See Comments)    Pt. States it has been too long to remember the reaction   . Penicillins Other (See Comments)    Pt. Does not recall the reaction.  Tolerates cephalosporins Has patient had a PCN reaction causing immediate rash, facial/tongue/throat swelling, SOB or lightheadedness with hypotension:  Has patient had a PCN reaction causing severe rash involving mucus membranes or skin necrosis: Has patient had a PCN reaction that required hospitalization  Has patient had a PCN reaction occurring within the last 10 years:  If all of the above answers are "NO", then may proceed with Cephalosporin use.    Consultations:  None  Procedures/Studies: Dg Chest 2 View  Result Date: 08/29/2017 CLINICAL DATA:  84 year old female with COPD and altered mental status. EXAM: CHEST - 2 VIEW COMPARISON:  Chest radiograph dated 06/21/2017 FINDINGS: There is emphysematous changes of the lungs. Left lung base/retrocardiac densities may represent atelectatic changes/scarring. Infiltrate is not excluded. Clinical correlation is recommended. There is blunting of the left costophrenic angle which may represent scarring or small effusion. There is no pneumothorax. Stable top-normal cardiac size. There is prominence of the hila suggestive of pulmonary hypertension. There is osteopenia with degenerative changes of the spine. Midthoracic vertebroplasty changes noted. No acute osseous pathology. Median sternotomy wires. IMPRESSION: 1. Left lung base atelectasis/scarring versus infiltrate. Probable trace left pleural effusion. 2. Emphysema. Electronically Signed   By: Anner Crete M.D.   On: 08/29/2017 22:01   Ct Head Wo Contrast  Result Date: 08/29/2017 CLINICAL DATA:  Altered mental status.  Lethargy. EXAM: CT HEAD WITHOUT CONTRAST TECHNIQUE: Contiguous axial images were obtained from the base of the skull through the vertex without intravenous contrast. COMPARISON:  Head CT, 04/17/2017 FINDINGS: Brain: No evidence of acute infarction, hemorrhage, hydrocephalus, extra-axial collection or mass lesion/mass effect. There is ventricular sulcal enlargement reflecting  moderate generalized atrophy. Mild periventricular white matter hypoattenuation is noted consistent with chronic microvascular ischemic change. Vascular: No hyperdense vessel or unexpected calcification. Skull: Normal. Negative for fracture or focal lesion. Sinuses/Orbits: Visualized globes and orbits are unremarkable. Visualized sinuses and mastoid air cells are clear. Other: None. IMPRESSION: 1. No acute intracranial abnormalities. 2. Atrophy and mild chronic microvascular ischemic change. Findings similar to the prior CT. Electronically Signed   By: Lajean Manes M.D.   On: 08/29/2017 22:03   Dg Chest Port 1 View  Result Date: 09/02/2017 CLINICAL  DATA:  84 year old female with shortness of breath. Recent altered mental status and lethargy. EXAM: PORTABLE CHEST 1 VIEW COMPARISON:  08/29/2017 and earlier. FINDINGS: Portable AP semi upright view at 1002 hours. Streaky retrocardiac left lung base opacity is stable and has not significantly changed compared to February radiographs. Stable pulmonary vascularity without acute edema. Stable cardiac size and mediastinal contours. No pneumothorax. Costophrenic blunting appears stable, no acute pleural effusion suspected. No areas of worsening ventilation. Osteopenia. Previous thoracic vertebral body augmentation. Prior sternotomy. Paucity of bowel gas in the upper abdomen. IMPRESSION: 1. Left lung base scarring and/or atelectasis. 2. No convincing acute cardiopulmonary abnormality. Electronically Signed   By: Genevie Ann M.D.   On: 09/02/2017 12:53   Dg Swallowing Func-speech Pathology  Result Date: 08/31/2017 Objective Swallowing Evaluation: Type of Study: MBS-Modified Barium Swallow Study  Patient Details Name: ANTASIA HAIDER MRN: [id number] Date of Birth: 19-Sep-1932 Today's Date: 08/31/2017 Time: SLP Start Time (ACUTE ONLY): 1430 -SLP Stop Time (ACUTE ONLY): 1510 SLP Time Calculation (min) (ACUTE ONLY): 40 min Past Medical History: Past Medical History: Diagnosis Date  . Arthritis  . Blind right eye  . Cataract   RT . CKD (chronic kidney disease)  . COPD (chronic obstructive pulmonary disease) ([place])  . Emphysema   followed by Dr. Joya Gaskins . GERD (gastroesophageal reflux disease)  . History of Doppler echocardiogram   carotid dopplers. (8/11) no significant stenosis . HTN (hypertension)  . Hx of echocardiogram 2015  Echo (08/2013):  EF 60-65%, no RWMA, Gr 1 DD, AVR ok (mean 5 mmHg), mild MR, PASP 22 mmHg . Hyperlipidemia  . Hypothyroidism  . Pulmonary embolism ([place]) 2010  She is no onger taking coumadin . S/P aortic valve replacement with bioprosthetic valve   ehco (8/11) with EF 55-60%, mild-mod MR, mild-mod TR, poorly visualized bioprosthetic aortic valve but no significant regurgitation and gradient not significanly elevated.   . S/P thoracic aortic aneurysm repair 1/10  also with bioprosthetic aortic valve prelacement [facility]). in 10/10 she has a descending thoraic aorta anuerysm and abdominal reapir [facility]). Vascular urgeon is Dr. Lunette Stands.  Marland Kitchen SVT (supraventricular tachycardia) (HCC)   HOLTER MONITOR AFTER SURGERY  Past Surgical History: Past Surgical History: Procedure Laterality Date . ANOMALOUS PULMONARY VENOUS RETURN REPAIR   . BACK SURGERY  1970  . EYE SURGERY    AS BABY- RT EYE  POOR SIGHT . INTRAMEDULLARY (IM) NAIL INTERTROCHANTERIC Right 04/20/2017  Procedure: INTRAMEDULLARY (IM) NAIL INTERTROCHANTRIC;  Surgeon: Leandrew Koyanagi, MD;  Location: [facility];  Service: Orthopedics;  Laterality: Right; . LUMBAR LAMINECTOMY/DECOMPRESSION MICRODISCECTOMY  05/31/2011  Procedure: LUMBAR LAMINECTOMY/DECOMPRESSION MICRODISCECTOMY;  Surgeon: Elaina Hoops, MD;  Location: [facility];  Service: Neurosurgery;  Laterality: Bilateral;  Bilateral Lumbar three-four decompressionj lumbar laminectomy  . THORACIC AORTIC ANEURYSM REPAIR  1/10  VALVE ALSO DONE . THORACOABDOMINAL AORTIC ANEURYSM REPAIR  01/28/09  repair . TONSILLECTOMY   HPI: 84 yo female adm to [facility] respiratory deficits  - PMH +  for CXR 08/29/2017 ? pna - left lung base ATX/scarring vs infiltrate - left pleural effusion, emphysema.  CT showed atrophy 08/29/2017.   MBS 06/29/2017 normal op swallow - flash penetration, concern for risk for post=prandial aspiration - pt could not swallow tablet.   Subjective: pt awake in chair Assessment / Plan / Recommendation CHL IP CLINICAL IMPRESSIONS 08/31/2017 Clinical Impression Mild oropharyngeal dysphagia without aspiration of any consistency tested.  Pt with oral coordination deficits resulting in premature spillage of boluses into pharynx = hitting  pyriform sinus prior to swallow trigger.  Trace laryngeal penetration of thin= observed that cleared with same swallow.   Pt with delayed oral with premature spillage of pudding, cracker to vallecular space - which may increase aspiration risk.  Prominent CP noted -barium tablet appeared to lodge at distal esophagus - without awareness.  Further liquids aided clearance.  Per MD note from OP MBS 06/2017 - pt had mild esophageal dysmotlity.  Of note, pt coughed twice during MBS without bairum visualized in larynx.   Recommend continue dys3/thin diet with strict precautions.  SLP Visit Diagnosis Dysphagia, oropharyngeal phase (R13.12) Attention and concentration deficit following -- Frontal lobe and executive function deficit following -- Impact on safety and function Mild aspiration risk   CHL IP TREATMENT RECOMMENDATION 08/31/2017 Treatment Recommendations Therapy as outlined in treatment plan below   Prognosis 08/31/2017 Prognosis for Safe Diet Advancement Fair Barriers to Reach Goals -- Barriers/Prognosis Comment -- CHL IP DIET RECOMMENDATION 08/31/2017 SLP Diet Recommendations Dysphagia 3 (Mech soft) solids;Thin liquid Liquid Administration via Straw Medication Administration Whole meds with puree Compensations Slow rate;Small sips/bites Postural Changes Remain semi-upright after after feeds/meals (Comment);Seated upright at 90 degrees   CHL IP OTHER RECOMMENDATIONS  08/31/2017 Recommended Consults -- Oral Care Recommendations Oral care BID Other Recommendations --   CHL IP FOLLOW UP RECOMMENDATIONS 08/31/2017 Follow up Recommendations None   CHL IP FREQUENCY AND DURATION 08/31/2017 Speech Therapy Frequency (ACUTE ONLY) min 1 x/week Treatment Duration 1 week      CHL IP ORAL PHASE 08/31/2017 Oral Phase Impaired Oral - Pudding Teaspoon -- Oral - Pudding Cup -- Oral - Honey Teaspoon -- Oral - Honey Cup -- Oral - Nectar Teaspoon Premature spillage Oral - Nectar Cup -- Oral - Nectar Straw Premature spillage Oral - Thin Teaspoon Premature spillage Oral - Thin Cup -- Oral - Thin Straw Premature spillage Oral - Puree Premature spillage;Delayed oral transit Oral - Mech Soft -- Oral - Regular Delayed oral transit;Decreased bolus cohesion;Premature spillage Oral - Multi-Consistency -- Oral - Pill Premature spillage Oral Phase - Comment --  CHL IP PHARYNGEAL PHASE 08/31/2017 Pharyngeal Phase Impaired Pharyngeal- Pudding Teaspoon -- Pharyngeal -- Pharyngeal- Pudding Cup -- Pharyngeal -- Pharyngeal- Honey Teaspoon -- Pharyngeal -- Pharyngeal- Honey Cup -- Pharyngeal -- Pharyngeal- Nectar Teaspoon Delayed swallow initiation-pyriform sinuses Pharyngeal -- Pharyngeal- Nectar Cup -- Pharyngeal -- Pharyngeal- Nectar Straw Delayed swallow initiation-pyriform sinuses Pharyngeal -- Pharyngeal- Thin Teaspoon Delayed swallow initiation-pyriform sinuses;Penetration/Aspiration before swallow Pharyngeal Material enters airway, remains ABOVE vocal cords then ejected out Pharyngeal- Thin Cup -- Pharyngeal -- Pharyngeal- Thin Straw Delayed swallow initiation-pyriform sinuses;Penetration/Aspiration during swallow Pharyngeal Material enters airway, remains ABOVE vocal cords then ejected out Pharyngeal- Puree Delayed swallow initiation-vallecula Pharyngeal -- Pharyngeal- Mechanical Soft -- Pharyngeal -- Pharyngeal- Regular Delayed swallow initiation-vallecula Pharyngeal -- Pharyngeal- Multi-consistency -- Pharyngeal --  Pharyngeal- Pill WFL Pharyngeal -- Pharyngeal Comment --  CHL IP CERVICAL ESOPHAGEAL PHASE 08/31/2017 Cervical Esophageal Phase Impaired Pudding Teaspoon -- Pudding Cup -- Honey Teaspoon -- Honey Cup -- Nectar Teaspoon -- Nectar Cup -- Nectar Straw -- Thin Teaspoon -- Thin Cup -- Thin Straw -- Puree -- Mechanical Soft -- Regular -- Multi-consistency -- Pill -- Cervical Esophageal Comment prominent cricopharyngeus, barium taken with thin appeared to lodge at distal esophagus without pt sensation, further boluses of thin helpful to clear,  per prior MBS - MD documented "mild esophageal dysmotility" No flowsheet data found. Macario Golds 08/31/2017, 4:12 PM  Luanna Salk, [facility] SLP [phone]  Xr Hip Unilat W Or W/o Pelvis 2-3 Views Right  Result Date: 08/11/2017 Increasing callus formation and bony consolidation  Subjective: Seen and examined at bedside and she was asleep and woke up from sleep.  Had no complaints at this time.  No chest pain, shortness breath, nausea, vomiting.  New that she was in the hospital.  Still remains slightly confused due to her dementia but has improved.  Ready to go back to skilled nursing facility.  Discharge Exam: Vitals:   09/02/17 0750 09/02/17 1345  BP:  (!) 143/71  Pulse: (!) 58 67  Resp: 18 16  Temp:    SpO2: 97% 98%   Vitals:   09/01/17 2030 09/02/17 0419 09/02/17 0750 09/02/17 1345  BP: 124/62 (!) 156/74  (!) 143/71  Pulse: 75 60 (!) 58 67  Resp: 20 20 18 16   Temp: (!) 97.3 F (36.3 C) 97.9 F (36.6 C)    TempSrc: Oral Oral    SpO2: 98% 98% 97% 98%  Weight:      Height:       General: Pt is alert, awake, not in acute distress Cardiovascular: RRR, S1/S2 +, no rubs, no gallops Respiratory: Diminished bilaterally, no wheezing, no rhonchi Abdominal: Soft, NT, ND, bowel sounds + Extremities: no appreciable edema, no cyanosis Skin: Has a DTI on Lower Back   The results of significant diagnostics from this hospitalization (including  imaging, microbiology, ancillary and laboratory) are listed below for reference.    Microbiology: Recent Results (from the past 240 hour(s))  Culture, blood (Routine X 2) w Reflex to ID Panel     Status: None (Preliminary result)   Collection Time: 08/29/17 11:34 PM  Result Value Ref Range Status   Specimen Description   Final    BLOOD LEFT ANTECUBITAL Performed at [facility] [address] [place], [place] [address]    Special Requests   Final    BOTTLES DRAWN AEROBIC AND ANAEROBIC Blood Culture adequate volume Performed at [facility] [address], [place], [place] [address]    Culture   Final    NO GROWTH 3 DAYS Performed at [facility], [place] [address], [place], [place] [address]    Report Status PENDING  Incomplete  Culture, blood (Routine X 2) w Reflex to ID Panel     Status: None (Preliminary result)   Collection Time: 08/30/17  5:41 AM  Result Value Ref Range Status   Specimen Description   Final    BLOOD LEFT ANTECUBITAL Performed at [facility] [address], [place], [place] [address]    Special Requests   Final    BOTTLES DRAWN AEROBIC ONLY Blood Culture adequate volume Performed at [facility] [address] [place], [place] [address]    Culture   Final    NO GROWTH 3 DAYS Performed at [facility], [place] [address] [place], [place] [address]    Report Status PENDING  Incomplete  MRSA PCR Screening     Status: None   Collection Time: 08/30/17  7:01 AM  Result Value Ref Range Status   MRSA by PCR NEGATIVE NEGATIVE Final    Comment:        The GeneXpert MRSA Assay (FDA approved for NASAL specimens only), is one component of a comprehensive MRSA colonization surveillance program. It is not intended to diagnose MRSA infection nor to guide or monitor treatment for MRSA infections. Performed at [facility],  [place] [address] [place],  [place] [address]     Labs: BNP (last 3 results) Recent Labs    04/18/17 0526 08/30/17 0541  BNP 124.0* 60.4   Basic Metabolic Panel: Recent Labs  Lab 08/29/17 2004 08/31/17 0515 08/31/17 0657 09/01/17 0511 09/02/17 0501  NA 141 QUESTIONABLE RESULTS, RECOMMEND RECOLLECT TO VERIFY 143 147* 143  K 3.9 QUESTIONABLE RESULTS, RECOMMEND RECOLLECT TO VERIFY 4.1 3.6 3.8  CL 93* QUESTIONABLE RESULTS, RECOMMEND RECOLLECT TO VERIFY 109 110 104  CO2 31 QUESTIONABLE RESULTS, RECOMMEND RECOLLECT TO VERIFY 26 24 26   GLUCOSE 103* QUESTIONABLE RESULTS, RECOMMEND RECOLLECT TO VERIFY 100* 122* 120*  BUN 53* QUESTIONABLE RESULTS, RECOMMEND RECOLLECT TO VERIFY 36* 28* 23*  CREATININE 2.83* QUESTIONABLE RESULTS, RECOMMEND RECOLLECT TO VERIFY 1.62* 1.26* 1.10*  CALCIUM 10.1 QUESTIONABLE RESULTS, RECOMMEND RECOLLECT TO VERIFY 8.8* 9.5 9.7  MG  --   --  2.0 1.9 1.7  PHOS  --   --  4.2 2.9 2.6   Liver Function Tests: Recent Labs  Lab 08/29/17 2004 09/01/17 0511 09/02/17 0501  AST 44* 25 28  ALT 19 15 18   ALKPHOS 61 41 43  BILITOT 0.4 0.7 0.3  PROT 7.5 5.8* 6.1*  ALBUMIN 3.6 2.8* 2.9*   No results for input(s): LIPASE, AMYLASE in the last 168 hours. No results for input(s): AMMONIA in the last 168 hours. CBC: Recent Labs  Lab 08/29/17 2004 08/31/17 0657 09/01/17 0511 09/02/17 0501  WBC 12.7* 5.5 8.7 9.8  NEUTROABS  --   --  7.0 8.2*  HGB 14.3 10.6* 10.9* 11.4*  HCT 42.9 33.9* 34.4* 35.4*  MCV 93.3 95.5 96.1 94.7  PLT 133* 156 190 154   Cardiac Enzymes: No results for input(s): CKTOTAL, CKMB, CKMBINDEX, TROPONINI in the last 168 hours. BNP: Invalid input(s): POCBNP CBG: Recent Labs  Lab 08/29/17 2017  GLUCAP 97   D-Dimer No results for input(s): DDIMER in the last 72 hours. Hgb A1c No results for input(s): HGBA1C in the last 72 hours. Lipid Profile No results for input(s): CHOL, HDL, LDLCALC, TRIG, CHOLHDL, LDLDIRECT in the last 72 hours. Thyroid function studies No results for  input(s): TSH, T4TOTAL, T3FREE, THYROIDAB in the last 72 hours.  Invalid input(s): FREET3 Anemia work up No results for input(s): VITAMINB12, FOLATE, FERRITIN, TIBC, IRON, RETICCTPCT in the last 72 hours. Urinalysis    Component Value Date/Time   COLORURINE STRAW (A) 08/29/2017 2230   APPEARANCEUR CLEAR 08/29/2017 2230   LABSPEC 1.003 (L) 08/29/2017 2230   PHURINE 6.0 08/29/2017 2230   GLUCOSEU NEGATIVE 08/29/2017 2230   HGBUR MODERATE (A) 08/29/2017 2230   BILIRUBINUR NEGATIVE 08/29/2017 2230   KETONESUR 80 (A) 08/29/2017 2230   PROTEINUR NEGATIVE 08/29/2017 2230   UROBILINOGEN 0.2 02/04/2015 2036   NITRITE NEGATIVE 08/29/2017 2230   LEUKOCYTESUR NEGATIVE 08/29/2017 2230   Sepsis Labs Invalid input(s): PROCALCITONIN,  WBC,  LACTICIDVEN Microbiology Recent Results (from the past 240 hour(s))  Culture, blood (Routine X 2) w Reflex to ID Panel     Status: None (Preliminary result)   Collection Time: 08/29/17 11:34 PM  Result Value Ref Range Status   Specimen Description   Final    BLOOD LEFT ANTECUBITAL Performed at [facility], [place] [address], [place], [place] [address]    Special Requests   Final    BOTTLES DRAWN AEROBIC AND ANAEROBIC Blood Culture adequate volume Performed at [facility] [address], [place], [place] [address]    Culture   Final  NO GROWTH 3 DAYS Performed at [facility], [place] [address], [place], [place] [address]    Report Status PENDING  Incomplete  Culture, blood (Routine X 2) w Reflex to ID Panel     Status: None (Preliminary result)   Collection Time: 08/30/17  5:41 AM  Result Value Ref Range Status   Specimen Description   Final    BLOOD LEFT ANTECUBITAL Performed at [facility] [address], [place], [place] [address]    Special Requests   Final    BOTTLES DRAWN AEROBIC ONLY Blood Culture adequate volume Performed at [facility] [address] [place], [place] [address]    Culture   Final    NO GROWTH 3 DAYS Performed at [facility], [place] [address], [place], [place] [address]    Report Status PENDING  Incomplete  MRSA PCR Screening     Status: None   Collection Time: 08/30/17  7:01 AM  Result Value Ref Range Status   MRSA by PCR NEGATIVE NEGATIVE Final    Comment:        The GeneXpert MRSA Assay (FDA approved for NASAL specimens only), is one component of a comprehensive MRSA colonization surveillance program. It is not intended to diagnose MRSA infection nor to guide or monitor treatment for MRSA infections. Performed at [facility], [place] [address], [place], [place] [address]    Time coordinating discharge: 35 minutes  SIGNED:  Kerney Elbe, DO [facility] 09/02/2017, 2:43 PM Pager [phone]  If 7PM-7AM, please contact night-coverage [email or web address] Password [email or web address]

## 2017-09-04 DIAGNOSIS — J449 Chronic obstructive pulmonary disease, unspecified: Secondary | ICD-10-CM | POA: Diagnosis not present

## 2017-09-04 DIAGNOSIS — F418 Other specified anxiety disorders: Secondary | ICD-10-CM | POA: Diagnosis not present

## 2017-09-04 DIAGNOSIS — E440 Moderate protein-calorie malnutrition: Secondary | ICD-10-CM | POA: Diagnosis not present

## 2017-09-04 DIAGNOSIS — R4586 Emotional lability: Secondary | ICD-10-CM | POA: Diagnosis not present

## 2017-09-04 DIAGNOSIS — L89159 Pressure ulcer of sacral region, unspecified stage: Secondary | ICD-10-CM | POA: Diagnosis not present

## 2017-09-04 DIAGNOSIS — F062 Psychotic disorder with delusions due to known physiological condition: Secondary | ICD-10-CM | POA: Diagnosis not present

## 2017-09-04 DIAGNOSIS — F3289 Other specified depressive episodes: Secondary | ICD-10-CM | POA: Diagnosis not present

## 2017-09-04 DIAGNOSIS — R0602 Shortness of breath: Secondary | ICD-10-CM | POA: Diagnosis not present

## 2017-09-04 DIAGNOSIS — M1991 Primary osteoarthritis, unspecified site: Secondary | ICD-10-CM | POA: Diagnosis not present

## 2017-09-04 LAB — CULTURE, BLOOD (ROUTINE X 2)
CULTURE: NO GROWTH
Culture: NO GROWTH
SPECIAL REQUESTS: ADEQUATE
Special Requests: ADEQUATE

## 2017-09-06 DIAGNOSIS — L89312 Pressure ulcer of right buttock, stage 2: Secondary | ICD-10-CM | POA: Diagnosis not present

## 2017-09-06 DIAGNOSIS — L89612 Pressure ulcer of right heel, stage 2: Secondary | ICD-10-CM | POA: Diagnosis not present

## 2017-09-13 DIAGNOSIS — L89312 Pressure ulcer of right buttock, stage 2: Secondary | ICD-10-CM | POA: Diagnosis not present

## 2017-09-13 DIAGNOSIS — L89612 Pressure ulcer of right heel, stage 2: Secondary | ICD-10-CM | POA: Diagnosis not present

## 2017-09-20 DIAGNOSIS — L89312 Pressure ulcer of right buttock, stage 2: Secondary | ICD-10-CM | POA: Diagnosis not present

## 2017-09-20 DIAGNOSIS — L89612 Pressure ulcer of right heel, stage 2: Secondary | ICD-10-CM | POA: Diagnosis not present

## 2017-09-23 ENCOUNTER — Ambulatory Visit (INDEPENDENT_AMBULATORY_CARE_PROVIDER_SITE_OTHER): Payer: No Typology Code available for payment source

## 2017-09-23 ENCOUNTER — Encounter (INDEPENDENT_AMBULATORY_CARE_PROVIDER_SITE_OTHER): Payer: Self-pay | Admitting: Orthopaedic Surgery

## 2017-09-23 ENCOUNTER — Ambulatory Visit (INDEPENDENT_AMBULATORY_CARE_PROVIDER_SITE_OTHER): Payer: No Typology Code available for payment source | Admitting: Orthopaedic Surgery

## 2017-09-23 DIAGNOSIS — S72001D Fracture of unspecified part of neck of right femur, subsequent encounter for closed fracture with routine healing: Secondary | ICD-10-CM

## 2017-09-23 NOTE — Progress Notes (Signed)
Office Visit Note   Patient: Sarah Johnson           Date of Birth: [birth year]           MRN: [id number] Visit Date: 09/23/2017              Requested by: Jani Gravel, MD [place], [place] [address] PCP: Jani Gravel, MD   Assessment & Plan: Visit Diagnoses:  1. Closed fracture of right hip with routine healing, subsequent encounter     Plan: Overall the x-rays do demonstrate aggressive healing and bony consolidation.  Recheck in 2 months with 2 view x-rays of the right hip.  Follow-Up Instructions: Return in about 2 months (around 11/23/2017).   Orders:  Orders Placed This Encounter  Procedures  . XR HIP UNILAT W OR W/O PELVIS 2-3 VIEWS RIGHT   No orders of the defined types were placed in this encounter.     Procedures: No procedures performed   Clinical Data: No additional findings.   Subjective: Chief Complaint  Patient presents with  . Right Hip - Pain    Patient is 5 months status post intramedullary nailing of a hip fracture.  She recently had to move to multiple facilities.  She recently had a bout of pneumonia and pressure sores.  She is now back to [facility].   Review of Systems   Objective: Vital Signs: There were no vitals taken for this visit.  Physical Exam  Ortho Exam Right hip exam is stable. Specialty Comments:  No specialty comments available.  Imaging: Xr Hip Unilat W Or W/o Pelvis 2-3 Views Right  Result Date: 09/23/2017 There is evidence of continued bony consolidation and callus formation.  No complications with hardware.    PMFS History: Patient Active Problem List   Diagnosis Date Noted  . Sacral pressure ulcer 08/30/2017  . Dementia 08/29/2017  . Depression 08/29/2017  . Acute on chronic respiratory failure with hypoxia ([place]) 08/29/2017  . Chronic diastolic CHF (congestive heart failure) ([place]) 08/29/2017  . Closed displaced intertrochanteric fracture of right femur ([place]) 04/18/2017  .  Preoperative evaluation of a medical condition to rule out surgical contraindications (TAR required)   . Cellulitis 11/28/2015  . Cellulitis of right hand 11/28/2015  . Hypothyroidism 11/28/2015  . COPD with acute exacerbation ([place]) 09/25/2015  . Leukocytosis 02/11/2015  . Hypokalemia 02/11/2015  . Hypomagnesemia 02/11/2015  . Deep vein thrombosis (DVT) of right lower extremity ([facility]) 02/08/2015  . Acute renal failure superimposed on stage 3 chronic kidney disease ([place]) 02/05/2015  . Anemia of chronic renal failure, stage 2 (mild) 02/05/2015  . Left lower lobe pneumonia ([place]) 02/05/2015  . Sepsis ([place])   . History of Clostridium difficile colitis 05/09/2014  . HCAP (healthcare-associated pneumonia) 03/25/2014  . History of aortic valve replacement 11/05/2010  . GERD (gastroesophageal reflux disease) 06/09/2010  . HLD (hyperlipidemia) 11/04/2009  . HTN (hypertension) 11/04/2009   Past Medical History:  Diagnosis Date  . Arthritis   . Blind right eye   . Cataract    RT  . CKD (chronic kidney disease)   . COPD (chronic obstructive pulmonary disease) ([place])   . Emphysema    followed by Dr. Joya Gaskins  . GERD (gastroesophageal reflux disease)   . History of Doppler echocardiogram    carotid dopplers. (8/11) no significant stenosis  . HTN (hypertension)   . Hx of echocardiogram 2015   Echo (08/2013):  EF 60-65%, no RWMA, Gr 1 DD, AVR ok (mean  5 mmHg), mild MR, PASP 22 mmHg  . Hyperlipidemia   . Hypothyroidism   . Pulmonary embolism ([facility]) 2010   She is no onger taking coumadin  . S/P aortic valve replacement with bioprosthetic valve    ehco (8/11) with EF 55-60%, mild-mod MR, mild-mod TR, poorly visualized bioprosthetic aortic valve but no significant regurgitation and gradient not significanly elevated.    . S/P thoracic aortic aneurysm repair 1/10   also with bioprosthetic aortic valve prelacement [facility]). in 10/10 she has a descending thoraic aorta anuerysm and abdominal reapir  [facility]). Vascular urgeon is Dr. Lunette Stands.   Marland Kitchen SVT (supraventricular tachycardia) (HCC)    HOLTER MONITOR AFTER SURGERY     Family History  Problem Relation Age of Onset  . Brain cancer Maternal Grandmother   . Coronary artery disease Unknown        no premature CAD    Past Surgical History:  Procedure Laterality Date  . ANOMALOUS PULMONARY VENOUS RETURN REPAIR    . BACK SURGERY  1970   . EYE SURGERY     AS BABY- RT EYE  POOR SIGHT  . INTRAMEDULLARY (IM) NAIL INTERTROCHANTERIC Right 04/20/2017   Procedure: INTRAMEDULLARY (IM) NAIL INTERTROCHANTRIC;  Surgeon: Leandrew Koyanagi, MD;  Location: [facility];  Service: Orthopedics;  Laterality: Right;  . LUMBAR LAMINECTOMY/DECOMPRESSION MICRODISCECTOMY  05/31/2011   Procedure: LUMBAR LAMINECTOMY/DECOMPRESSION MICRODISCECTOMY;  Surgeon: Elaina Hoops, MD;  Location: [facility];  Service: Neurosurgery;  Laterality: Bilateral;  Bilateral Lumbar three-four decompressionj lumbar laminectomy   . THORACIC AORTIC ANEURYSM REPAIR  1/10   VALVE ALSO DONE  . THORACOABDOMINAL AORTIC ANEURYSM REPAIR  01/28/09   repair  . TONSILLECTOMY     Social History   Occupational History  . Occupation: Retired  Tobacco Use  . Smoking status: Former Smoker    Packs/day: 1.00    Years: 20.00    Pack years: 20.00    Types: Cigarettes    Last attempt to quit: 04/26/2004    Years since quitting: 13.4  . Smokeless tobacco: Never Used  . Tobacco comment: quit in 2006  Substance and Sexual Activity  . Alcohol use: No  . Drug use: No  . Sexual activity: Yes    Birth control/protection: Post-menopausal

## 2017-09-27 DIAGNOSIS — L89612 Pressure ulcer of right heel, stage 2: Secondary | ICD-10-CM | POA: Diagnosis not present

## 2017-09-27 DIAGNOSIS — L89312 Pressure ulcer of right buttock, stage 2: Secondary | ICD-10-CM | POA: Diagnosis not present

## 2017-10-04 DIAGNOSIS — L89612 Pressure ulcer of right heel, stage 2: Secondary | ICD-10-CM | POA: Diagnosis not present

## 2017-10-04 DIAGNOSIS — L89312 Pressure ulcer of right buttock, stage 2: Secondary | ICD-10-CM | POA: Diagnosis not present

## 2017-10-10 DIAGNOSIS — N390 Urinary tract infection, site not specified: Secondary | ICD-10-CM | POA: Diagnosis not present

## 2017-10-10 DIAGNOSIS — R319 Hematuria, unspecified: Secondary | ICD-10-CM | POA: Diagnosis not present

## 2017-10-11 DIAGNOSIS — L89312 Pressure ulcer of right buttock, stage 2: Secondary | ICD-10-CM | POA: Diagnosis not present

## 2017-10-11 DIAGNOSIS — L89612 Pressure ulcer of right heel, stage 2: Secondary | ICD-10-CM | POA: Diagnosis not present

## 2017-10-18 DIAGNOSIS — L89612 Pressure ulcer of right heel, stage 2: Secondary | ICD-10-CM | POA: Diagnosis not present

## 2017-10-18 DIAGNOSIS — L89312 Pressure ulcer of right buttock, stage 2: Secondary | ICD-10-CM | POA: Diagnosis not present

## 2017-10-19 DIAGNOSIS — E039 Hypothyroidism, unspecified: Secondary | ICD-10-CM | POA: Diagnosis not present

## 2017-10-21 DIAGNOSIS — R2681 Unsteadiness on feet: Secondary | ICD-10-CM | POA: Diagnosis not present

## 2017-10-21 DIAGNOSIS — J441 Chronic obstructive pulmonary disease with (acute) exacerbation: Secondary | ICD-10-CM | POA: Diagnosis not present

## 2017-10-25 DIAGNOSIS — L89612 Pressure ulcer of right heel, stage 2: Secondary | ICD-10-CM | POA: Diagnosis not present

## 2017-10-25 DIAGNOSIS — L89312 Pressure ulcer of right buttock, stage 2: Secondary | ICD-10-CM | POA: Diagnosis not present

## 2017-11-01 DIAGNOSIS — L89310 Pressure ulcer of right buttock, unstageable: Secondary | ICD-10-CM | POA: Diagnosis not present

## 2017-11-08 DIAGNOSIS — L89310 Pressure ulcer of right buttock, unstageable: Secondary | ICD-10-CM | POA: Diagnosis not present

## 2017-11-15 DIAGNOSIS — L89310 Pressure ulcer of right buttock, unstageable: Secondary | ICD-10-CM | POA: Diagnosis not present

## 2017-11-18 DIAGNOSIS — F0281 Dementia in other diseases classified elsewhere with behavioral disturbance: Secondary | ICD-10-CM | POA: Diagnosis not present

## 2017-11-18 DIAGNOSIS — L89310 Pressure ulcer of right buttock, unstageable: Secondary | ICD-10-CM | POA: Diagnosis not present

## 2017-11-18 DIAGNOSIS — R293 Abnormal posture: Secondary | ICD-10-CM | POA: Diagnosis not present

## 2017-11-22 DIAGNOSIS — L89310 Pressure ulcer of right buttock, unstageable: Secondary | ICD-10-CM | POA: Diagnosis not present

## 2017-11-23 ENCOUNTER — Encounter (INDEPENDENT_AMBULATORY_CARE_PROVIDER_SITE_OTHER): Payer: Self-pay | Admitting: Orthopaedic Surgery

## 2017-11-23 ENCOUNTER — Ambulatory Visit (INDEPENDENT_AMBULATORY_CARE_PROVIDER_SITE_OTHER): Payer: Medicare Other

## 2017-11-23 ENCOUNTER — Ambulatory Visit (INDEPENDENT_AMBULATORY_CARE_PROVIDER_SITE_OTHER): Payer: Medicare Other | Admitting: Orthopaedic Surgery

## 2017-11-23 DIAGNOSIS — M25551 Pain in right hip: Secondary | ICD-10-CM

## 2017-11-23 DIAGNOSIS — S72001D Fracture of unspecified part of neck of right femur, subsequent encounter for closed fracture with routine healing: Secondary | ICD-10-CM | POA: Diagnosis not present

## 2017-11-23 NOTE — Progress Notes (Signed)
Post-Op Visit Note   Patient: Sarah Johnson           Date of Birth: 1932-07-02           MRN: [id number] Visit Date: 11/23/2017 PCP: Jani Gravel, MD   Assessment & Plan:  Chief Complaint:  Chief Complaint  Patient presents with  . Right Hip - Pain   Visit Diagnoses:  1. Pain in right hip   2. Closed fracture of right hip with routine healing, subsequent encounter     Plan: Patient is 7 months status post intramedullary fixation of a subtrochanteric femur fracture.  She reports no pain.  She has not walked much since she has been back to the hospital.  Her exam is benign.  She does not have enough strength to stand on her own.  X-rays show that her fracture is essentially healed.  From my standpoint I would like to see him back in 5 months for their one year visit.  We will take x-rays of the right hip at that time.  Likely release her at that time.  Follow-Up Instructions: Return in about 5 months (around 04/25/2018).   Orders:  Orders Placed This Encounter  Procedures  . XR HIP UNILAT W OR W/O PELVIS 2-3 VIEWS RIGHT   No orders of the defined types were placed in this encounter.   Imaging: Xr Hip Unilat W Or W/o Pelvis 2-3 Views Right  Result Date: 11/23/2017 There is evidence of continued healing and bridging bony callus.  No complications.   PMFS History: Patient Active Problem List   Diagnosis Date Noted  . Sacral pressure ulcer 08/30/2017  . Dementia 08/29/2017  . Depression 08/29/2017  . Acute on chronic respiratory failure with hypoxia ([place]) 08/29/2017  . Chronic diastolic CHF (congestive heart failure) ([place]) 08/29/2017  . Closed displaced intertrochanteric fracture of right femur ([place]) 04/18/2017  . Preoperative evaluation of a medical condition to rule out surgical contraindications (TAR required)   . Cellulitis 11/28/2015  . Cellulitis of right hand 11/28/2015  . Hypothyroidism 11/28/2015  . COPD with acute exacerbation ([place]) 09/25/2015  .  Leukocytosis 02/11/2015  . Hypokalemia 02/11/2015  . Hypomagnesemia 02/11/2015  . Deep vein thrombosis (DVT) of right lower extremity ([place]) 02/08/2015  . Acute renal failure superimposed on stage 3 chronic kidney disease ([place]) 02/05/2015  . Anemia of chronic renal failure, stage 2 (mild) 02/05/2015  . Left lower lobe pneumonia ([place]) 02/05/2015  . Sepsis ([place])   . History of Clostridium difficile colitis 05/09/2014  . HCAP (healthcare-associated pneumonia) 03/25/2014  . History of aortic valve replacement 11/05/2010  . GERD (gastroesophageal reflux disease) 06/09/2010  . HLD (hyperlipidemia) 11/04/2009  . HTN (hypertension) 11/04/2009   Past Medical History:  Diagnosis Date  . Arthritis   . Blind right eye   . Cataract    RT  . CKD (chronic kidney disease)   . COPD (chronic obstructive pulmonary disease) ([place])   . Emphysema    followed by Dr. Joya Gaskins  . GERD (gastroesophageal reflux disease)   . History of Doppler echocardiogram    carotid dopplers. (8/11) no significant stenosis  . HTN (hypertension)   . Hx of echocardiogram 2015   Echo (08/2013):  EF 60-65%, no RWMA, Gr 1 DD, AVR ok (mean 5 mmHg), mild MR, PASP 22 mmHg  . Hyperlipidemia   . Hypothyroidism   . Pulmonary embolism ([place]) 2010   She is no onger taking coumadin  . S/P aortic valve replacement with bioprosthetic valve  ehco (8/11) with EF 55-60%, mild-mod MR, mild-mod TR, poorly visualized bioprosthetic aortic valve but no significant regurgitation and gradient not significanly elevated.    . S/P thoracic aortic aneurysm repair 1/10   also with bioprosthetic aortic valve prelacement [facility]). in 10/10 she has a descending thoraic aorta anuerysm and abdominal reapir [facility]). Vascular urgeon is Dr. Lunette Stands.   Marland Kitchen SVT (supraventricular tachycardia) (HCC)    HOLTER MONITOR AFTER SURGERY     Family History  Problem Relation Age of Onset  . Brain cancer Maternal Grandmother   . Coronary artery disease  Unknown        no premature CAD    Past Surgical History:  Procedure Laterality Date  . ANOMALOUS PULMONARY VENOUS RETURN REPAIR    . BACK SURGERY  1970   . EYE SURGERY     AS BABY- RT EYE  POOR SIGHT  . INTRAMEDULLARY (IM) NAIL INTERTROCHANTERIC Right 04/20/2017   Procedure: INTRAMEDULLARY (IM) NAIL INTERTROCHANTRIC;  Surgeon: Leandrew Koyanagi, MD;  Location: [facility];  Service: Orthopedics;  Laterality: Right;  . LUMBAR LAMINECTOMY/DECOMPRESSION MICRODISCECTOMY  05/31/2011   Procedure: LUMBAR LAMINECTOMY/DECOMPRESSION MICRODISCECTOMY;  Surgeon: Elaina Hoops, MD;  Location: [facility];  Service: Neurosurgery;  Laterality: Bilateral;  Bilateral Lumbar three-four decompressionj lumbar laminectomy   . THORACIC AORTIC ANEURYSM REPAIR  1/10   VALVE ALSO DONE  . THORACOABDOMINAL AORTIC ANEURYSM REPAIR  01/28/09   repair  . TONSILLECTOMY     Social History   Occupational History  . Occupation: Retired  Tobacco Use  . Smoking status: Former Smoker    Packs/day: 1.00    Years: 20.00    Pack years: 20.00    Types: Cigarettes    Last attempt to quit: 04/26/2004    Years since quitting: 13.5  . Smokeless tobacco: Never Used  . Tobacco comment: quit in 2006  Substance and Sexual Activity  . Alcohol use: No  . Drug use: No  . Sexual activity: Yes    Birth control/protection: Post-menopausal

## 2017-11-29 DIAGNOSIS — L89310 Pressure ulcer of right buttock, unstageable: Secondary | ICD-10-CM | POA: Diagnosis not present

## 2017-12-01 DIAGNOSIS — R41841 Cognitive communication deficit: Secondary | ICD-10-CM | POA: Diagnosis not present

## 2017-12-01 DIAGNOSIS — R278 Other lack of coordination: Secondary | ICD-10-CM | POA: Diagnosis not present

## 2017-12-01 DIAGNOSIS — F0281 Dementia in other diseases classified elsewhere with behavioral disturbance: Secondary | ICD-10-CM | POA: Diagnosis not present

## 2017-12-01 DIAGNOSIS — R293 Abnormal posture: Secondary | ICD-10-CM | POA: Diagnosis not present

## 2017-12-01 DIAGNOSIS — R52 Pain, unspecified: Secondary | ICD-10-CM | POA: Diagnosis not present

## 2017-12-01 DIAGNOSIS — J9621 Acute and chronic respiratory failure with hypoxia: Secondary | ICD-10-CM | POA: Diagnosis not present

## 2017-12-01 DIAGNOSIS — R2681 Unsteadiness on feet: Secondary | ICD-10-CM | POA: Diagnosis not present

## 2017-12-05 DIAGNOSIS — J9621 Acute and chronic respiratory failure with hypoxia: Secondary | ICD-10-CM | POA: Diagnosis not present

## 2017-12-05 DIAGNOSIS — R293 Abnormal posture: Secondary | ICD-10-CM | POA: Diagnosis not present

## 2017-12-05 DIAGNOSIS — R2681 Unsteadiness on feet: Secondary | ICD-10-CM | POA: Diagnosis not present

## 2017-12-05 DIAGNOSIS — R41841 Cognitive communication deficit: Secondary | ICD-10-CM | POA: Diagnosis not present

## 2017-12-05 DIAGNOSIS — R52 Pain, unspecified: Secondary | ICD-10-CM | POA: Diagnosis not present

## 2017-12-05 DIAGNOSIS — R278 Other lack of coordination: Secondary | ICD-10-CM | POA: Diagnosis not present

## 2017-12-06 DIAGNOSIS — R41841 Cognitive communication deficit: Secondary | ICD-10-CM | POA: Diagnosis not present

## 2017-12-06 DIAGNOSIS — R293 Abnormal posture: Secondary | ICD-10-CM | POA: Diagnosis not present

## 2017-12-06 DIAGNOSIS — R52 Pain, unspecified: Secondary | ICD-10-CM | POA: Diagnosis not present

## 2017-12-06 DIAGNOSIS — R278 Other lack of coordination: Secondary | ICD-10-CM | POA: Diagnosis not present

## 2017-12-06 DIAGNOSIS — L89310 Pressure ulcer of right buttock, unstageable: Secondary | ICD-10-CM | POA: Diagnosis not present

## 2017-12-06 DIAGNOSIS — R2681 Unsteadiness on feet: Secondary | ICD-10-CM | POA: Diagnosis not present

## 2017-12-06 DIAGNOSIS — J9621 Acute and chronic respiratory failure with hypoxia: Secondary | ICD-10-CM | POA: Diagnosis not present

## 2017-12-13 DIAGNOSIS — L89310 Pressure ulcer of right buttock, unstageable: Secondary | ICD-10-CM | POA: Diagnosis not present

## 2017-12-19 DIAGNOSIS — R293 Abnormal posture: Secondary | ICD-10-CM | POA: Diagnosis not present

## 2017-12-19 DIAGNOSIS — J9621 Acute and chronic respiratory failure with hypoxia: Secondary | ICD-10-CM | POA: Diagnosis not present

## 2017-12-19 DIAGNOSIS — R2681 Unsteadiness on feet: Secondary | ICD-10-CM | POA: Diagnosis not present

## 2017-12-19 DIAGNOSIS — R52 Pain, unspecified: Secondary | ICD-10-CM | POA: Diagnosis not present

## 2017-12-19 DIAGNOSIS — R278 Other lack of coordination: Secondary | ICD-10-CM | POA: Diagnosis not present

## 2017-12-19 DIAGNOSIS — R41841 Cognitive communication deficit: Secondary | ICD-10-CM | POA: Diagnosis not present

## 2017-12-20 DIAGNOSIS — J9621 Acute and chronic respiratory failure with hypoxia: Secondary | ICD-10-CM | POA: Diagnosis not present

## 2017-12-20 DIAGNOSIS — R278 Other lack of coordination: Secondary | ICD-10-CM | POA: Diagnosis not present

## 2017-12-20 DIAGNOSIS — L89310 Pressure ulcer of right buttock, unstageable: Secondary | ICD-10-CM | POA: Diagnosis not present

## 2017-12-20 DIAGNOSIS — R2681 Unsteadiness on feet: Secondary | ICD-10-CM | POA: Diagnosis not present

## 2017-12-20 DIAGNOSIS — R293 Abnormal posture: Secondary | ICD-10-CM | POA: Diagnosis not present

## 2017-12-20 DIAGNOSIS — R52 Pain, unspecified: Secondary | ICD-10-CM | POA: Diagnosis not present

## 2017-12-20 DIAGNOSIS — R41841 Cognitive communication deficit: Secondary | ICD-10-CM | POA: Diagnosis not present

## 2017-12-21 DIAGNOSIS — R293 Abnormal posture: Secondary | ICD-10-CM | POA: Diagnosis not present

## 2017-12-21 DIAGNOSIS — R2681 Unsteadiness on feet: Secondary | ICD-10-CM | POA: Diagnosis not present

## 2017-12-21 DIAGNOSIS — R52 Pain, unspecified: Secondary | ICD-10-CM | POA: Diagnosis not present

## 2017-12-21 DIAGNOSIS — J9621 Acute and chronic respiratory failure with hypoxia: Secondary | ICD-10-CM | POA: Diagnosis not present

## 2017-12-21 DIAGNOSIS — R278 Other lack of coordination: Secondary | ICD-10-CM | POA: Diagnosis not present

## 2017-12-21 DIAGNOSIS — R41841 Cognitive communication deficit: Secondary | ICD-10-CM | POA: Diagnosis not present

## 2017-12-22 DIAGNOSIS — R52 Pain, unspecified: Secondary | ICD-10-CM | POA: Diagnosis not present

## 2017-12-22 DIAGNOSIS — R41841 Cognitive communication deficit: Secondary | ICD-10-CM | POA: Diagnosis not present

## 2017-12-22 DIAGNOSIS — R293 Abnormal posture: Secondary | ICD-10-CM | POA: Diagnosis not present

## 2017-12-22 DIAGNOSIS — R278 Other lack of coordination: Secondary | ICD-10-CM | POA: Diagnosis not present

## 2017-12-22 DIAGNOSIS — J9621 Acute and chronic respiratory failure with hypoxia: Secondary | ICD-10-CM | POA: Diagnosis not present

## 2017-12-22 DIAGNOSIS — R2681 Unsteadiness on feet: Secondary | ICD-10-CM | POA: Diagnosis not present

## 2017-12-23 DIAGNOSIS — R41841 Cognitive communication deficit: Secondary | ICD-10-CM | POA: Diagnosis not present

## 2017-12-23 DIAGNOSIS — R2681 Unsteadiness on feet: Secondary | ICD-10-CM | POA: Diagnosis not present

## 2017-12-23 DIAGNOSIS — J9621 Acute and chronic respiratory failure with hypoxia: Secondary | ICD-10-CM | POA: Diagnosis not present

## 2017-12-23 DIAGNOSIS — R52 Pain, unspecified: Secondary | ICD-10-CM | POA: Diagnosis not present

## 2017-12-23 DIAGNOSIS — R293 Abnormal posture: Secondary | ICD-10-CM | POA: Diagnosis not present

## 2017-12-23 DIAGNOSIS — R278 Other lack of coordination: Secondary | ICD-10-CM | POA: Diagnosis not present

## 2017-12-24 DIAGNOSIS — J449 Chronic obstructive pulmonary disease, unspecified: Secondary | ICD-10-CM | POA: Diagnosis not present

## 2017-12-24 DIAGNOSIS — E038 Other specified hypothyroidism: Secondary | ICD-10-CM | POA: Diagnosis not present

## 2017-12-24 DIAGNOSIS — E440 Moderate protein-calorie malnutrition: Secondary | ICD-10-CM | POA: Diagnosis not present

## 2017-12-26 DIAGNOSIS — F0281 Dementia in other diseases classified elsewhere with behavioral disturbance: Secondary | ICD-10-CM | POA: Diagnosis not present

## 2017-12-26 DIAGNOSIS — R278 Other lack of coordination: Secondary | ICD-10-CM | POA: Diagnosis not present

## 2017-12-26 DIAGNOSIS — R52 Pain, unspecified: Secondary | ICD-10-CM | POA: Diagnosis not present

## 2017-12-26 DIAGNOSIS — R2681 Unsteadiness on feet: Secondary | ICD-10-CM | POA: Diagnosis not present

## 2017-12-27 DIAGNOSIS — L89310 Pressure ulcer of right buttock, unstageable: Secondary | ICD-10-CM | POA: Diagnosis not present

## 2017-12-27 DIAGNOSIS — R52 Pain, unspecified: Secondary | ICD-10-CM | POA: Diagnosis not present

## 2017-12-27 DIAGNOSIS — R2681 Unsteadiness on feet: Secondary | ICD-10-CM | POA: Diagnosis not present

## 2017-12-27 DIAGNOSIS — R278 Other lack of coordination: Secondary | ICD-10-CM | POA: Diagnosis not present

## 2017-12-27 DIAGNOSIS — F0281 Dementia in other diseases classified elsewhere with behavioral disturbance: Secondary | ICD-10-CM | POA: Diagnosis not present

## 2017-12-28 DIAGNOSIS — R278 Other lack of coordination: Secondary | ICD-10-CM | POA: Diagnosis not present

## 2017-12-28 DIAGNOSIS — R2681 Unsteadiness on feet: Secondary | ICD-10-CM | POA: Diagnosis not present

## 2017-12-28 DIAGNOSIS — R52 Pain, unspecified: Secondary | ICD-10-CM | POA: Diagnosis not present

## 2017-12-28 DIAGNOSIS — F0281 Dementia in other diseases classified elsewhere with behavioral disturbance: Secondary | ICD-10-CM | POA: Diagnosis not present

## 2017-12-29 DIAGNOSIS — R278 Other lack of coordination: Secondary | ICD-10-CM | POA: Diagnosis not present

## 2017-12-29 DIAGNOSIS — R52 Pain, unspecified: Secondary | ICD-10-CM | POA: Diagnosis not present

## 2017-12-29 DIAGNOSIS — F0281 Dementia in other diseases classified elsewhere with behavioral disturbance: Secondary | ICD-10-CM | POA: Diagnosis not present

## 2017-12-29 DIAGNOSIS — R2681 Unsteadiness on feet: Secondary | ICD-10-CM | POA: Diagnosis not present

## 2017-12-30 DIAGNOSIS — R52 Pain, unspecified: Secondary | ICD-10-CM | POA: Diagnosis not present

## 2017-12-30 DIAGNOSIS — R2681 Unsteadiness on feet: Secondary | ICD-10-CM | POA: Diagnosis not present

## 2017-12-30 DIAGNOSIS — F0281 Dementia in other diseases classified elsewhere with behavioral disturbance: Secondary | ICD-10-CM | POA: Diagnosis not present

## 2017-12-30 DIAGNOSIS — R278 Other lack of coordination: Secondary | ICD-10-CM | POA: Diagnosis not present

## 2018-01-02 DIAGNOSIS — R278 Other lack of coordination: Secondary | ICD-10-CM | POA: Diagnosis not present

## 2018-01-02 DIAGNOSIS — R2681 Unsteadiness on feet: Secondary | ICD-10-CM | POA: Diagnosis not present

## 2018-01-02 DIAGNOSIS — F0281 Dementia in other diseases classified elsewhere with behavioral disturbance: Secondary | ICD-10-CM | POA: Diagnosis not present

## 2018-01-02 DIAGNOSIS — R52 Pain, unspecified: Secondary | ICD-10-CM | POA: Diagnosis not present

## 2018-01-03 DIAGNOSIS — L89310 Pressure ulcer of right buttock, unstageable: Secondary | ICD-10-CM | POA: Diagnosis not present

## 2018-01-03 DIAGNOSIS — R2681 Unsteadiness on feet: Secondary | ICD-10-CM | POA: Diagnosis not present

## 2018-01-03 DIAGNOSIS — F0281 Dementia in other diseases classified elsewhere with behavioral disturbance: Secondary | ICD-10-CM | POA: Diagnosis not present

## 2018-01-03 DIAGNOSIS — R278 Other lack of coordination: Secondary | ICD-10-CM | POA: Diagnosis not present

## 2018-01-03 DIAGNOSIS — R52 Pain, unspecified: Secondary | ICD-10-CM | POA: Diagnosis not present

## 2018-01-04 DIAGNOSIS — R2681 Unsteadiness on feet: Secondary | ICD-10-CM | POA: Diagnosis not present

## 2018-01-04 DIAGNOSIS — R278 Other lack of coordination: Secondary | ICD-10-CM | POA: Diagnosis not present

## 2018-01-04 DIAGNOSIS — F0281 Dementia in other diseases classified elsewhere with behavioral disturbance: Secondary | ICD-10-CM | POA: Diagnosis not present

## 2018-01-04 DIAGNOSIS — R52 Pain, unspecified: Secondary | ICD-10-CM | POA: Diagnosis not present

## 2018-01-05 DIAGNOSIS — F0281 Dementia in other diseases classified elsewhere with behavioral disturbance: Secondary | ICD-10-CM | POA: Diagnosis not present

## 2018-01-05 DIAGNOSIS — R52 Pain, unspecified: Secondary | ICD-10-CM | POA: Diagnosis not present

## 2018-01-05 DIAGNOSIS — R2681 Unsteadiness on feet: Secondary | ICD-10-CM | POA: Diagnosis not present

## 2018-01-05 DIAGNOSIS — R278 Other lack of coordination: Secondary | ICD-10-CM | POA: Diagnosis not present

## 2018-01-06 DIAGNOSIS — R52 Pain, unspecified: Secondary | ICD-10-CM | POA: Diagnosis not present

## 2018-01-06 DIAGNOSIS — F0281 Dementia in other diseases classified elsewhere with behavioral disturbance: Secondary | ICD-10-CM | POA: Diagnosis not present

## 2018-01-06 DIAGNOSIS — R2681 Unsteadiness on feet: Secondary | ICD-10-CM | POA: Diagnosis not present

## 2018-01-06 DIAGNOSIS — R278 Other lack of coordination: Secondary | ICD-10-CM | POA: Diagnosis not present

## 2018-01-08 DIAGNOSIS — R52 Pain, unspecified: Secondary | ICD-10-CM | POA: Diagnosis not present

## 2018-01-08 DIAGNOSIS — F0281 Dementia in other diseases classified elsewhere with behavioral disturbance: Secondary | ICD-10-CM | POA: Diagnosis not present

## 2018-01-08 DIAGNOSIS — R278 Other lack of coordination: Secondary | ICD-10-CM | POA: Diagnosis not present

## 2018-01-08 DIAGNOSIS — R2681 Unsteadiness on feet: Secondary | ICD-10-CM | POA: Diagnosis not present

## 2018-01-09 DIAGNOSIS — R2681 Unsteadiness on feet: Secondary | ICD-10-CM | POA: Diagnosis not present

## 2018-01-09 DIAGNOSIS — F0281 Dementia in other diseases classified elsewhere with behavioral disturbance: Secondary | ICD-10-CM | POA: Diagnosis not present

## 2018-01-09 DIAGNOSIS — R52 Pain, unspecified: Secondary | ICD-10-CM | POA: Diagnosis not present

## 2018-01-09 DIAGNOSIS — R278 Other lack of coordination: Secondary | ICD-10-CM | POA: Diagnosis not present

## 2018-01-10 DIAGNOSIS — R278 Other lack of coordination: Secondary | ICD-10-CM | POA: Diagnosis not present

## 2018-01-10 DIAGNOSIS — R52 Pain, unspecified: Secondary | ICD-10-CM | POA: Diagnosis not present

## 2018-01-10 DIAGNOSIS — R2681 Unsteadiness on feet: Secondary | ICD-10-CM | POA: Diagnosis not present

## 2018-01-10 DIAGNOSIS — F0281 Dementia in other diseases classified elsewhere with behavioral disturbance: Secondary | ICD-10-CM | POA: Diagnosis not present

## 2018-01-10 DIAGNOSIS — L89310 Pressure ulcer of right buttock, unstageable: Secondary | ICD-10-CM | POA: Diagnosis not present

## 2018-01-11 DIAGNOSIS — R2681 Unsteadiness on feet: Secondary | ICD-10-CM | POA: Diagnosis not present

## 2018-01-11 DIAGNOSIS — F0281 Dementia in other diseases classified elsewhere with behavioral disturbance: Secondary | ICD-10-CM | POA: Diagnosis not present

## 2018-01-11 DIAGNOSIS — R52 Pain, unspecified: Secondary | ICD-10-CM | POA: Diagnosis not present

## 2018-01-11 DIAGNOSIS — R278 Other lack of coordination: Secondary | ICD-10-CM | POA: Diagnosis not present

## 2018-01-12 DIAGNOSIS — F0281 Dementia in other diseases classified elsewhere with behavioral disturbance: Secondary | ICD-10-CM | POA: Diagnosis not present

## 2018-01-12 DIAGNOSIS — R52 Pain, unspecified: Secondary | ICD-10-CM | POA: Diagnosis not present

## 2018-01-12 DIAGNOSIS — R2681 Unsteadiness on feet: Secondary | ICD-10-CM | POA: Diagnosis not present

## 2018-01-12 DIAGNOSIS — R278 Other lack of coordination: Secondary | ICD-10-CM | POA: Diagnosis not present

## 2018-01-16 DIAGNOSIS — R2681 Unsteadiness on feet: Secondary | ICD-10-CM | POA: Diagnosis not present

## 2018-01-16 DIAGNOSIS — R278 Other lack of coordination: Secondary | ICD-10-CM | POA: Diagnosis not present

## 2018-01-16 DIAGNOSIS — L89310 Pressure ulcer of right buttock, unstageable: Secondary | ICD-10-CM | POA: Diagnosis not present

## 2018-01-16 DIAGNOSIS — F0281 Dementia in other diseases classified elsewhere with behavioral disturbance: Secondary | ICD-10-CM | POA: Diagnosis not present

## 2018-01-16 DIAGNOSIS — R52 Pain, unspecified: Secondary | ICD-10-CM | POA: Diagnosis not present

## 2018-01-17 DIAGNOSIS — F0281 Dementia in other diseases classified elsewhere with behavioral disturbance: Secondary | ICD-10-CM | POA: Diagnosis not present

## 2018-01-17 DIAGNOSIS — R52 Pain, unspecified: Secondary | ICD-10-CM | POA: Diagnosis not present

## 2018-01-17 DIAGNOSIS — R2681 Unsteadiness on feet: Secondary | ICD-10-CM | POA: Diagnosis not present

## 2018-01-17 DIAGNOSIS — R278 Other lack of coordination: Secondary | ICD-10-CM | POA: Diagnosis not present

## 2018-01-18 DIAGNOSIS — R52 Pain, unspecified: Secondary | ICD-10-CM | POA: Diagnosis not present

## 2018-01-18 DIAGNOSIS — R278 Other lack of coordination: Secondary | ICD-10-CM | POA: Diagnosis not present

## 2018-01-18 DIAGNOSIS — R2681 Unsteadiness on feet: Secondary | ICD-10-CM | POA: Diagnosis not present

## 2018-01-18 DIAGNOSIS — F0281 Dementia in other diseases classified elsewhere with behavioral disturbance: Secondary | ICD-10-CM | POA: Diagnosis not present

## 2018-01-19 DIAGNOSIS — R52 Pain, unspecified: Secondary | ICD-10-CM | POA: Diagnosis not present

## 2018-01-19 DIAGNOSIS — F0281 Dementia in other diseases classified elsewhere with behavioral disturbance: Secondary | ICD-10-CM | POA: Diagnosis not present

## 2018-01-19 DIAGNOSIS — R2681 Unsteadiness on feet: Secondary | ICD-10-CM | POA: Diagnosis not present

## 2018-01-19 DIAGNOSIS — R278 Other lack of coordination: Secondary | ICD-10-CM | POA: Diagnosis not present

## 2018-01-20 DIAGNOSIS — F0281 Dementia in other diseases classified elsewhere with behavioral disturbance: Secondary | ICD-10-CM | POA: Diagnosis not present

## 2018-01-20 DIAGNOSIS — R2681 Unsteadiness on feet: Secondary | ICD-10-CM | POA: Diagnosis not present

## 2018-01-20 DIAGNOSIS — R52 Pain, unspecified: Secondary | ICD-10-CM | POA: Diagnosis not present

## 2018-01-20 DIAGNOSIS — R278 Other lack of coordination: Secondary | ICD-10-CM | POA: Diagnosis not present

## 2018-01-23 DIAGNOSIS — F0281 Dementia in other diseases classified elsewhere with behavioral disturbance: Secondary | ICD-10-CM | POA: Diagnosis not present

## 2018-01-23 DIAGNOSIS — R52 Pain, unspecified: Secondary | ICD-10-CM | POA: Diagnosis not present

## 2018-01-23 DIAGNOSIS — R278 Other lack of coordination: Secondary | ICD-10-CM | POA: Diagnosis not present

## 2018-01-23 DIAGNOSIS — R2681 Unsteadiness on feet: Secondary | ICD-10-CM | POA: Diagnosis not present

## 2018-01-24 DIAGNOSIS — L89314 Pressure ulcer of right buttock, stage 4: Secondary | ICD-10-CM | POA: Diagnosis not present

## 2018-01-24 DIAGNOSIS — R52 Pain, unspecified: Secondary | ICD-10-CM | POA: Diagnosis not present

## 2018-01-24 DIAGNOSIS — R2681 Unsteadiness on feet: Secondary | ICD-10-CM | POA: Diagnosis not present

## 2018-01-24 DIAGNOSIS — F0281 Dementia in other diseases classified elsewhere with behavioral disturbance: Secondary | ICD-10-CM | POA: Diagnosis not present

## 2018-01-24 DIAGNOSIS — R278 Other lack of coordination: Secondary | ICD-10-CM | POA: Diagnosis not present

## 2018-01-25 DIAGNOSIS — F0281 Dementia in other diseases classified elsewhere with behavioral disturbance: Secondary | ICD-10-CM | POA: Diagnosis not present

## 2018-01-25 DIAGNOSIS — R52 Pain, unspecified: Secondary | ICD-10-CM | POA: Diagnosis not present

## 2018-01-25 DIAGNOSIS — R2681 Unsteadiness on feet: Secondary | ICD-10-CM | POA: Diagnosis not present

## 2018-01-25 DIAGNOSIS — R278 Other lack of coordination: Secondary | ICD-10-CM | POA: Diagnosis not present

## 2018-01-26 DIAGNOSIS — R52 Pain, unspecified: Secondary | ICD-10-CM | POA: Diagnosis not present

## 2018-01-26 DIAGNOSIS — F0281 Dementia in other diseases classified elsewhere with behavioral disturbance: Secondary | ICD-10-CM | POA: Diagnosis not present

## 2018-01-26 DIAGNOSIS — R2681 Unsteadiness on feet: Secondary | ICD-10-CM | POA: Diagnosis not present

## 2018-01-26 DIAGNOSIS — R278 Other lack of coordination: Secondary | ICD-10-CM | POA: Diagnosis not present

## 2018-01-27 DIAGNOSIS — R278 Other lack of coordination: Secondary | ICD-10-CM | POA: Diagnosis not present

## 2018-01-27 DIAGNOSIS — R2681 Unsteadiness on feet: Secondary | ICD-10-CM | POA: Diagnosis not present

## 2018-01-27 DIAGNOSIS — F0281 Dementia in other diseases classified elsewhere with behavioral disturbance: Secondary | ICD-10-CM | POA: Diagnosis not present

## 2018-01-27 DIAGNOSIS — R52 Pain, unspecified: Secondary | ICD-10-CM | POA: Diagnosis not present

## 2018-01-30 DIAGNOSIS — R278 Other lack of coordination: Secondary | ICD-10-CM | POA: Diagnosis not present

## 2018-01-30 DIAGNOSIS — F0281 Dementia in other diseases classified elsewhere with behavioral disturbance: Secondary | ICD-10-CM | POA: Diagnosis not present

## 2018-01-30 DIAGNOSIS — R52 Pain, unspecified: Secondary | ICD-10-CM | POA: Diagnosis not present

## 2018-01-30 DIAGNOSIS — R2681 Unsteadiness on feet: Secondary | ICD-10-CM | POA: Diagnosis not present

## 2018-01-31 DIAGNOSIS — L89314 Pressure ulcer of right buttock, stage 4: Secondary | ICD-10-CM | POA: Diagnosis not present

## 2018-01-31 DIAGNOSIS — R52 Pain, unspecified: Secondary | ICD-10-CM | POA: Diagnosis not present

## 2018-01-31 DIAGNOSIS — F0281 Dementia in other diseases classified elsewhere with behavioral disturbance: Secondary | ICD-10-CM | POA: Diagnosis not present

## 2018-01-31 DIAGNOSIS — R278 Other lack of coordination: Secondary | ICD-10-CM | POA: Diagnosis not present

## 2018-01-31 DIAGNOSIS — R2681 Unsteadiness on feet: Secondary | ICD-10-CM | POA: Diagnosis not present

## 2018-02-01 DIAGNOSIS — R2681 Unsteadiness on feet: Secondary | ICD-10-CM | POA: Diagnosis not present

## 2018-02-01 DIAGNOSIS — I10 Essential (primary) hypertension: Secondary | ICD-10-CM | POA: Diagnosis not present

## 2018-02-01 DIAGNOSIS — R52 Pain, unspecified: Secondary | ICD-10-CM | POA: Diagnosis not present

## 2018-02-01 DIAGNOSIS — R509 Fever, unspecified: Secondary | ICD-10-CM | POA: Diagnosis not present

## 2018-02-01 DIAGNOSIS — R278 Other lack of coordination: Secondary | ICD-10-CM | POA: Diagnosis not present

## 2018-02-01 DIAGNOSIS — F0281 Dementia in other diseases classified elsewhere with behavioral disturbance: Secondary | ICD-10-CM | POA: Diagnosis not present

## 2018-02-01 DIAGNOSIS — G4483 Primary cough headache: Secondary | ICD-10-CM | POA: Diagnosis not present

## 2018-02-02 DIAGNOSIS — R52 Pain, unspecified: Secondary | ICD-10-CM | POA: Diagnosis not present

## 2018-02-02 DIAGNOSIS — R2681 Unsteadiness on feet: Secondary | ICD-10-CM | POA: Diagnosis not present

## 2018-02-02 DIAGNOSIS — R278 Other lack of coordination: Secondary | ICD-10-CM | POA: Diagnosis not present

## 2018-02-02 DIAGNOSIS — F0281 Dementia in other diseases classified elsewhere with behavioral disturbance: Secondary | ICD-10-CM | POA: Diagnosis not present

## 2018-02-03 DIAGNOSIS — F0281 Dementia in other diseases classified elsewhere with behavioral disturbance: Secondary | ICD-10-CM | POA: Diagnosis not present

## 2018-02-03 DIAGNOSIS — R2681 Unsteadiness on feet: Secondary | ICD-10-CM | POA: Diagnosis not present

## 2018-02-03 DIAGNOSIS — R52 Pain, unspecified: Secondary | ICD-10-CM | POA: Diagnosis not present

## 2018-02-03 DIAGNOSIS — R278 Other lack of coordination: Secondary | ICD-10-CM | POA: Diagnosis not present

## 2018-02-06 DIAGNOSIS — F0281 Dementia in other diseases classified elsewhere with behavioral disturbance: Secondary | ICD-10-CM | POA: Diagnosis not present

## 2018-02-06 DIAGNOSIS — R278 Other lack of coordination: Secondary | ICD-10-CM | POA: Diagnosis not present

## 2018-02-06 DIAGNOSIS — R2681 Unsteadiness on feet: Secondary | ICD-10-CM | POA: Diagnosis not present

## 2018-02-06 DIAGNOSIS — R52 Pain, unspecified: Secondary | ICD-10-CM | POA: Diagnosis not present

## 2018-02-07 DIAGNOSIS — R52 Pain, unspecified: Secondary | ICD-10-CM | POA: Diagnosis not present

## 2018-02-07 DIAGNOSIS — R278 Other lack of coordination: Secondary | ICD-10-CM | POA: Diagnosis not present

## 2018-02-07 DIAGNOSIS — R2681 Unsteadiness on feet: Secondary | ICD-10-CM | POA: Diagnosis not present

## 2018-02-07 DIAGNOSIS — L89314 Pressure ulcer of right buttock, stage 4: Secondary | ICD-10-CM | POA: Diagnosis not present

## 2018-02-07 DIAGNOSIS — F0281 Dementia in other diseases classified elsewhere with behavioral disturbance: Secondary | ICD-10-CM | POA: Diagnosis not present

## 2018-02-08 DIAGNOSIS — F0391 Unspecified dementia with behavioral disturbance: Secondary | ICD-10-CM | POA: Diagnosis not present

## 2018-02-08 DIAGNOSIS — R2681 Unsteadiness on feet: Secondary | ICD-10-CM | POA: Diagnosis not present

## 2018-02-08 DIAGNOSIS — G309 Alzheimer's disease, unspecified: Secondary | ICD-10-CM | POA: Diagnosis not present

## 2018-02-08 DIAGNOSIS — R52 Pain, unspecified: Secondary | ICD-10-CM | POA: Diagnosis not present

## 2018-02-08 DIAGNOSIS — F411 Generalized anxiety disorder: Secondary | ICD-10-CM | POA: Diagnosis not present

## 2018-02-08 DIAGNOSIS — F329 Major depressive disorder, single episode, unspecified: Secondary | ICD-10-CM | POA: Diagnosis not present

## 2018-02-08 DIAGNOSIS — F0281 Dementia in other diseases classified elsewhere with behavioral disturbance: Secondary | ICD-10-CM | POA: Diagnosis not present

## 2018-02-08 DIAGNOSIS — R278 Other lack of coordination: Secondary | ICD-10-CM | POA: Diagnosis not present

## 2018-02-09 DIAGNOSIS — R278 Other lack of coordination: Secondary | ICD-10-CM | POA: Diagnosis not present

## 2018-02-09 DIAGNOSIS — R2681 Unsteadiness on feet: Secondary | ICD-10-CM | POA: Diagnosis not present

## 2018-02-09 DIAGNOSIS — F0281 Dementia in other diseases classified elsewhere with behavioral disturbance: Secondary | ICD-10-CM | POA: Diagnosis not present

## 2018-02-09 DIAGNOSIS — R52 Pain, unspecified: Secondary | ICD-10-CM | POA: Diagnosis not present

## 2018-02-10 DIAGNOSIS — R278 Other lack of coordination: Secondary | ICD-10-CM | POA: Diagnosis not present

## 2018-02-10 DIAGNOSIS — F0281 Dementia in other diseases classified elsewhere with behavioral disturbance: Secondary | ICD-10-CM | POA: Diagnosis not present

## 2018-02-10 DIAGNOSIS — R52 Pain, unspecified: Secondary | ICD-10-CM | POA: Diagnosis not present

## 2018-02-10 DIAGNOSIS — R2681 Unsteadiness on feet: Secondary | ICD-10-CM | POA: Diagnosis not present

## 2018-02-12 DIAGNOSIS — R05 Cough: Secondary | ICD-10-CM | POA: Diagnosis not present

## 2018-02-12 DIAGNOSIS — R0609 Other forms of dyspnea: Secondary | ICD-10-CM | POA: Diagnosis not present

## 2018-02-12 DIAGNOSIS — J449 Chronic obstructive pulmonary disease, unspecified: Secondary | ICD-10-CM | POA: Diagnosis not present

## 2018-02-13 DIAGNOSIS — Z79899 Other long term (current) drug therapy: Secondary | ICD-10-CM | POA: Diagnosis not present

## 2018-02-13 DIAGNOSIS — N390 Urinary tract infection, site not specified: Secondary | ICD-10-CM | POA: Diagnosis not present

## 2018-02-13 DIAGNOSIS — R319 Hematuria, unspecified: Secondary | ICD-10-CM | POA: Diagnosis not present

## 2018-02-13 DIAGNOSIS — R079 Chest pain, unspecified: Secondary | ICD-10-CM | POA: Diagnosis not present

## 2018-02-13 DIAGNOSIS — R0602 Shortness of breath: Secondary | ICD-10-CM | POA: Diagnosis not present

## 2018-02-13 DIAGNOSIS — R2681 Unsteadiness on feet: Secondary | ICD-10-CM | POA: Diagnosis not present

## 2018-02-13 DIAGNOSIS — I10 Essential (primary) hypertension: Secondary | ICD-10-CM | POA: Diagnosis not present

## 2018-02-13 DIAGNOSIS — D649 Anemia, unspecified: Secondary | ICD-10-CM | POA: Diagnosis not present

## 2018-02-13 DIAGNOSIS — F0281 Dementia in other diseases classified elsewhere with behavioral disturbance: Secondary | ICD-10-CM | POA: Diagnosis not present

## 2018-02-13 DIAGNOSIS — R52 Pain, unspecified: Secondary | ICD-10-CM | POA: Diagnosis not present

## 2018-02-13 DIAGNOSIS — R278 Other lack of coordination: Secondary | ICD-10-CM | POA: Diagnosis not present

## 2018-02-14 DIAGNOSIS — R278 Other lack of coordination: Secondary | ICD-10-CM | POA: Diagnosis not present

## 2018-02-14 DIAGNOSIS — R52 Pain, unspecified: Secondary | ICD-10-CM | POA: Diagnosis not present

## 2018-02-14 DIAGNOSIS — R2681 Unsteadiness on feet: Secondary | ICD-10-CM | POA: Diagnosis not present

## 2018-02-14 DIAGNOSIS — F0281 Dementia in other diseases classified elsewhere with behavioral disturbance: Secondary | ICD-10-CM | POA: Diagnosis not present

## 2018-02-24 DEATH — deceased

## 2018-04-25 ENCOUNTER — Ambulatory Visit (INDEPENDENT_AMBULATORY_CARE_PROVIDER_SITE_OTHER): Payer: Medicare Other | Admitting: Orthopaedic Surgery

## 2018-09-23 IMAGING — CT CT ANGIO CHEST
2 of 12 series · 11 of 46 positions shown · IV contrast (ISOVUE)
Comparison: Chest CT 05/30/2012

CLINICAL DATA: 84-year-old female with a history of shortness of
breath. Postop. Concern for pulmonary embolism

EXAM:
CT ANGIOGRAPHY CHEST WITH CONTRAST
TECHNIQUE: Multidetector CT imaging of the chest was performed using the
standard protocol during bolus administration of intravenous
contrast. Multiplanar CT image reconstructions and MIPs were
obtained to evaluate the vascular anatomy.
CONTRAST:  80mL JRS3PM-5LN IOPAMIDOL (JRS3PM-5LN) INJECTION 76%

[Series 5: thins · axial · 0.62mm/px · z∈[-254,-60]mm · 10 of 238 slices shown]
[im 22/238  lung]
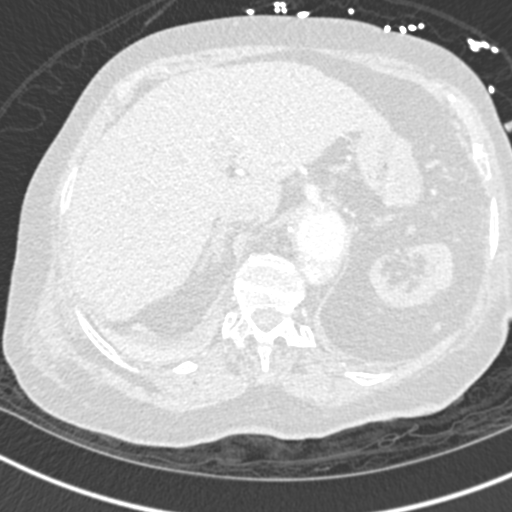
[im 44/238  soft-tissue]
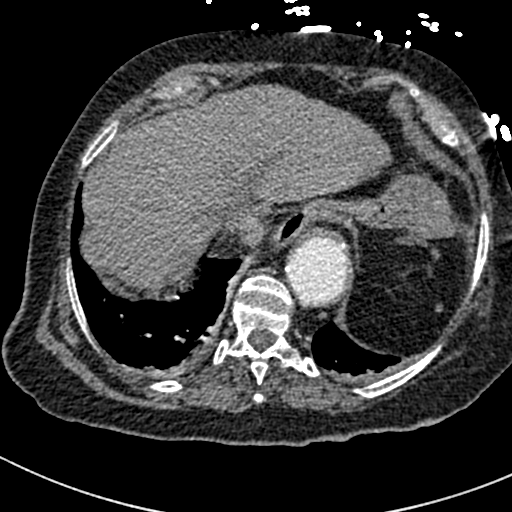
[im 65/238  lung]
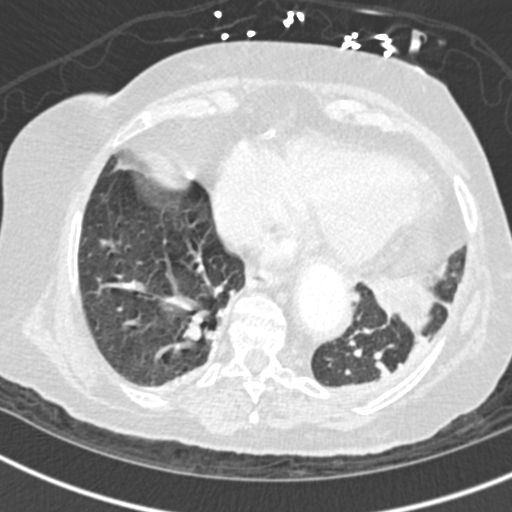
[im 87/238  soft-tissue]
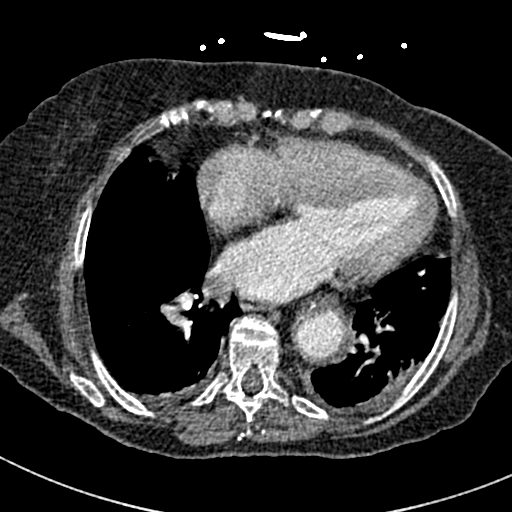
[im 108/238  lung]
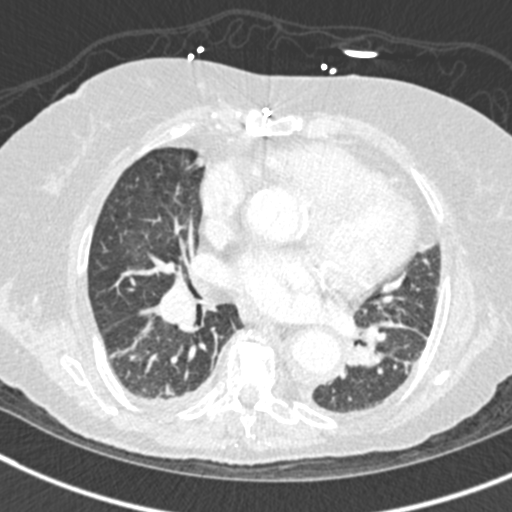
[im 130/238  soft-tissue]
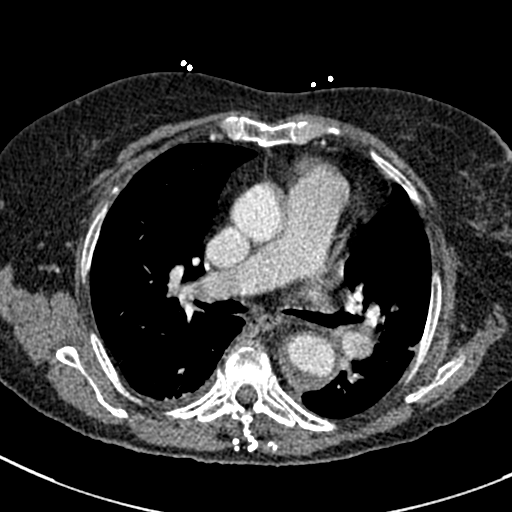
[im 151/238  lung]
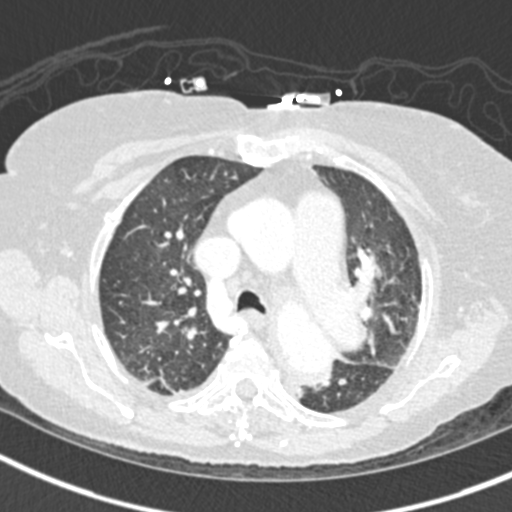
[im 173/238  soft-tissue]
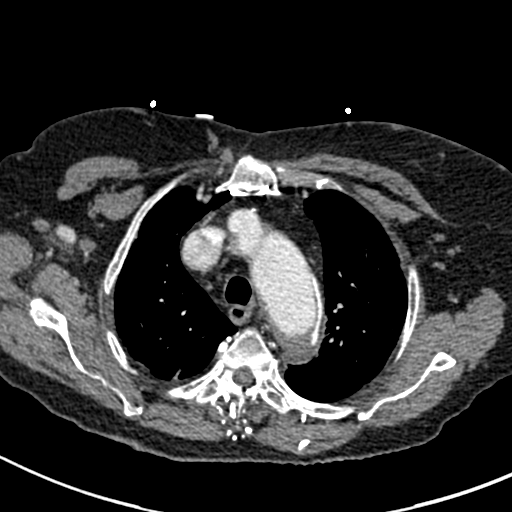
[im 194/238  lung]
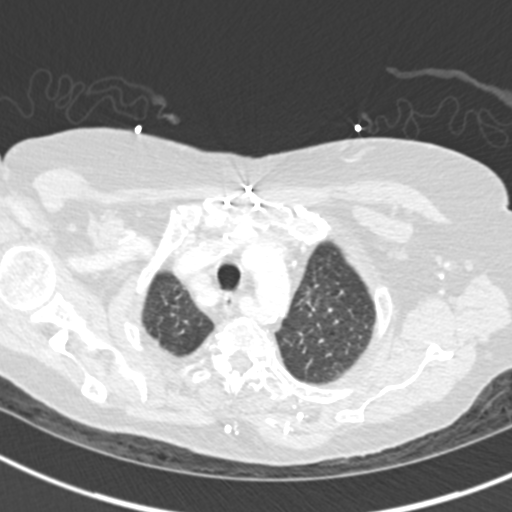
[im 216/238  soft-tissue]
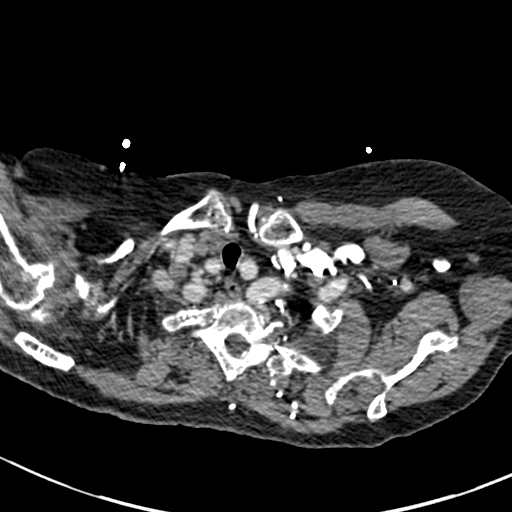

[Series 15: coronal mpr · coronal · 0.49mm/px · 1 of 132 slices shown]
[im 66/132  soft-tissue]
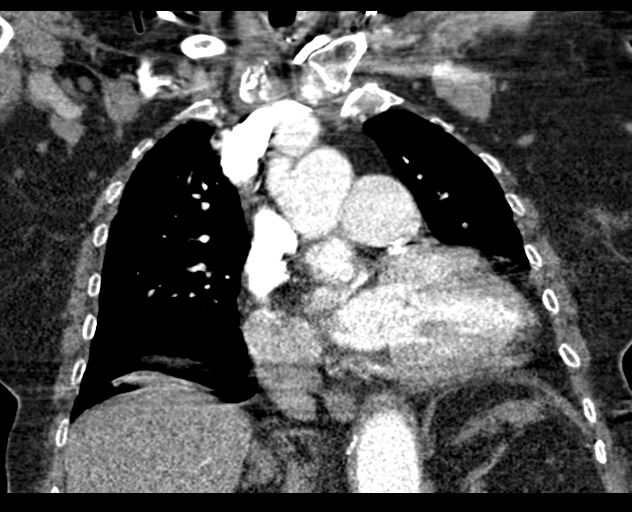

[11 of 46 positions shown; findings below may reference images not displayed]

FINDINGS: Cardiovascular:

Heart:

Cardiomegaly. No pericardial fluid/ thickening. Calcifications of
the native coronary vessels including left main, left anterior
descending, circumflex, right coronary artery. Surgical changes of
aortic valve replacement.

Aorta:

Surgical changes of prior median sternotomy with apparent total arch
repair, likely for prior dissection. No comparison available. There
has been valve replacement, grafting of the tubular aorta with the
branching of the aortic arch and complete arch reconstruction. Graft
for the branch vessels is patent with patency maintained of the
proximal carotid arteries and left vertebral artery. The right
vertebral artery not visualized. Bilateral subclavian artery remain
patent.

Greatest diameter of the aortic arch near the surgical anastomosis
3.8 cm.

Descending thoracic aorta with the greatest diameter just above the
aortic hiatus measures 4.1 cm. Mild circumferential intimal
thickening/hyperplasia without stenosis. No periaortic fluid.

Pulmonary arteries:

No filling defect of the main pulmonary artery, lobar or proximal
segmental pulmonary arteries. Beyond the segmental pulmonary
arteries, study is limited by the contrast bolus and respiratory
motion.

Mediastinum/Nodes: Surgical changes of prior median sternotomy and
aortic repair. Small lymph nodes within the mediastinum.

Unremarkable thoracic inlet.

Lungs/Pleura: No endotracheal or endobronchial debris. Trace pleural
effusions. Mild interlobular septal thickening and thickening of the
fissures. No confluent airspace disease. No bronchial wall
thickening.

Upper Abdomen: Unremarkable.

Musculoskeletal: Surgical changes of kyphoplasty of the midthoracic
vertebral body. No acute fracture line identified. Accentuated
kyphotic curvature of the thoracic spine. No bony canal narrowing.
Subacute/ healing rib fractures on the right, including 3, 4, 5

Review of the MIP images confirms the above findings.
IMPRESSION: Study is negative for pulmonary emboli.

Questionable early pulmonary edema with trace bilateral pleural
effusions. No evidence of lobar pneumonia.

Surgical changes of prior median sternotomy, aortic valve
replacement, ascending and complete arch reconstruction with
debranching. No complicating features.

Native coronary calcifications including left main and 3 vessel
coronary artery disease.
